# Patient Record
Sex: Female | Born: 1985 | Race: Black or African American | Hispanic: No | Marital: Single | Smoking: Current every day smoker
Health system: Southern US, Community
[De-identification: ages and names within clinical notes are randomized; demographics above are authoritative.]

## PROBLEM LIST (undated history)

## (undated) DIAGNOSIS — K219 Gastro-esophageal reflux disease without esophagitis: Secondary | ICD-10-CM

## (undated) DIAGNOSIS — Z8742 Personal history of other diseases of the female genital tract: Secondary | ICD-10-CM

## (undated) DIAGNOSIS — D649 Anemia, unspecified: Secondary | ICD-10-CM

## (undated) DIAGNOSIS — E669 Obesity, unspecified: Secondary | ICD-10-CM

## (undated) DIAGNOSIS — F329 Major depressive disorder, single episode, unspecified: Secondary | ICD-10-CM

## (undated) DIAGNOSIS — F419 Anxiety disorder, unspecified: Secondary | ICD-10-CM

## (undated) DIAGNOSIS — A749 Chlamydial infection, unspecified: Secondary | ICD-10-CM

## (undated) DIAGNOSIS — F32A Depression, unspecified: Secondary | ICD-10-CM

## (undated) DIAGNOSIS — O139 Gestational [pregnancy-induced] hypertension without significant proteinuria, unspecified trimester: Secondary | ICD-10-CM

## (undated) DIAGNOSIS — Z22322 Carrier or suspected carrier of Methicillin resistant Staphylococcus aureus: Secondary | ICD-10-CM

## (undated) DIAGNOSIS — A599 Trichomoniasis, unspecified: Secondary | ICD-10-CM

## (undated) DIAGNOSIS — T7840XA Allergy, unspecified, initial encounter: Secondary | ICD-10-CM

## (undated) DIAGNOSIS — N390 Urinary tract infection, site not specified: Secondary | ICD-10-CM

## (undated) DIAGNOSIS — E119 Type 2 diabetes mellitus without complications: Secondary | ICD-10-CM

## (undated) HISTORY — DX: Type 2 diabetes mellitus without complications: E11.9

## (undated) HISTORY — PX: WISDOM TOOTH EXTRACTION: SHX21

## (undated) HISTORY — PX: CHOLECYSTECTOMY: SHX55

## (undated) HISTORY — PX: ECTOPIC PREGNANCY SURGERY: SHX613

## (undated) HISTORY — DX: Gastro-esophageal reflux disease without esophagitis: K21.9

## (undated) HISTORY — DX: Anxiety disorder, unspecified: F41.9

## (undated) HISTORY — DX: Allergy, unspecified, initial encounter: T78.40XA

## (undated) HISTORY — DX: Anemia, unspecified: D64.9

---
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
## 2005-02-20 ENCOUNTER — Emergency Department (HOSPITAL_COMMUNITY): Admission: EM | Admit: 2005-02-20 | Discharge: 2005-02-20 | Payer: Self-pay | Admitting: Emergency Medicine

## 2005-03-07 ENCOUNTER — Emergency Department (HOSPITAL_COMMUNITY): Admission: EM | Admit: 2005-03-07 | Discharge: 2005-03-07 | Payer: Self-pay | Admitting: Emergency Medicine

## 2005-03-19 ENCOUNTER — Emergency Department (HOSPITAL_COMMUNITY): Admission: EM | Admit: 2005-03-19 | Discharge: 2005-03-19 | Payer: Self-pay | Admitting: Emergency Medicine

## 2005-04-02 ENCOUNTER — Emergency Department (HOSPITAL_COMMUNITY): Admission: EM | Admit: 2005-04-02 | Discharge: 2005-04-02 | Payer: Self-pay | Admitting: Emergency Medicine

## 2005-05-06 ENCOUNTER — Emergency Department (HOSPITAL_COMMUNITY): Admission: EM | Admit: 2005-05-06 | Discharge: 2005-05-06 | Payer: Self-pay | Admitting: Family Medicine

## 2005-07-02 ENCOUNTER — Emergency Department (HOSPITAL_COMMUNITY): Admission: EM | Admit: 2005-07-02 | Discharge: 2005-07-02 | Payer: Self-pay | Admitting: *Deleted

## 2005-07-04 ENCOUNTER — Emergency Department (HOSPITAL_COMMUNITY): Admission: EM | Admit: 2005-07-04 | Discharge: 2005-07-04 | Payer: Self-pay | Admitting: Family Medicine

## 2005-07-04 ENCOUNTER — Emergency Department (HOSPITAL_COMMUNITY): Admission: EM | Admit: 2005-07-04 | Discharge: 2005-07-04 | Payer: Self-pay | Admitting: Emergency Medicine

## 2005-09-23 ENCOUNTER — Emergency Department (HOSPITAL_COMMUNITY): Admission: EM | Admit: 2005-09-23 | Discharge: 2005-09-24 | Payer: Self-pay | Admitting: Emergency Medicine

## 2005-09-25 ENCOUNTER — Ambulatory Visit (HOSPITAL_COMMUNITY): Admission: AD | Admit: 2005-09-25 | Discharge: 2005-09-26 | Payer: Self-pay | Admitting: Obstetrics and Gynecology

## 2005-09-30 ENCOUNTER — Inpatient Hospital Stay (HOSPITAL_COMMUNITY): Admission: AD | Admit: 2005-09-30 | Discharge: 2005-09-30 | Payer: Self-pay | Admitting: Obstetrics & Gynecology

## 2005-10-08 ENCOUNTER — Other Ambulatory Visit: Admission: RE | Admit: 2005-10-08 | Discharge: 2005-10-08 | Payer: Self-pay | Admitting: Gynecology

## 2006-05-12 ENCOUNTER — Emergency Department (HOSPITAL_COMMUNITY): Admission: EM | Admit: 2006-05-12 | Discharge: 2006-05-12 | Payer: Self-pay | Admitting: Family Medicine

## 2006-05-27 ENCOUNTER — Emergency Department (HOSPITAL_COMMUNITY): Admission: EM | Admit: 2006-05-27 | Discharge: 2006-05-27 | Payer: Self-pay | Admitting: Emergency Medicine

## 2006-06-07 ENCOUNTER — Emergency Department (HOSPITAL_COMMUNITY): Admission: EM | Admit: 2006-06-07 | Discharge: 2006-06-07 | Payer: Self-pay | Admitting: Emergency Medicine

## 2006-07-01 ENCOUNTER — Emergency Department (HOSPITAL_COMMUNITY): Admission: EM | Admit: 2006-07-01 | Discharge: 2006-07-01 | Payer: Self-pay | Admitting: Emergency Medicine

## 2006-07-19 ENCOUNTER — Emergency Department (HOSPITAL_COMMUNITY): Admission: EM | Admit: 2006-07-19 | Discharge: 2006-07-19 | Payer: Self-pay | Admitting: Family Medicine

## 2006-08-28 ENCOUNTER — Emergency Department (HOSPITAL_COMMUNITY): Admission: EM | Admit: 2006-08-28 | Discharge: 2006-08-29 | Payer: Self-pay | Admitting: Emergency Medicine

## 2006-09-27 ENCOUNTER — Emergency Department (HOSPITAL_COMMUNITY): Admission: EM | Admit: 2006-09-27 | Discharge: 2006-09-27 | Payer: Self-pay | Admitting: Emergency Medicine

## 2006-10-25 ENCOUNTER — Emergency Department (HOSPITAL_COMMUNITY): Admission: EM | Admit: 2006-10-25 | Discharge: 2006-10-25 | Payer: Self-pay | Admitting: Emergency Medicine

## 2007-02-17 ENCOUNTER — Emergency Department (HOSPITAL_COMMUNITY): Admission: EM | Admit: 2007-02-17 | Discharge: 2007-02-17 | Payer: Self-pay | Admitting: Emergency Medicine

## 2007-05-23 ENCOUNTER — Ambulatory Visit (HOSPITAL_COMMUNITY): Admission: RE | Admit: 2007-05-23 | Discharge: 2007-05-23 | Payer: Self-pay | Admitting: Obstetrics & Gynecology

## 2007-07-17 ENCOUNTER — Ambulatory Visit: Payer: Self-pay | Admitting: Advanced Practice Midwife

## 2007-07-17 ENCOUNTER — Inpatient Hospital Stay (HOSPITAL_COMMUNITY): Admission: AD | Admit: 2007-07-17 | Discharge: 2007-07-17 | Payer: Self-pay | Admitting: Obstetrics & Gynecology

## 2007-07-31 ENCOUNTER — Inpatient Hospital Stay (HOSPITAL_COMMUNITY): Admission: AD | Admit: 2007-07-31 | Discharge: 2007-07-31 | Payer: Self-pay | Admitting: Gynecology

## 2007-08-22 ENCOUNTER — Ambulatory Visit: Payer: Self-pay | Admitting: Obstetrics & Gynecology

## 2007-08-22 ENCOUNTER — Ambulatory Visit (HOSPITAL_COMMUNITY): Admission: RE | Admit: 2007-08-22 | Discharge: 2007-08-22 | Payer: Self-pay | Admitting: Gynecology

## 2007-08-29 ENCOUNTER — Ambulatory Visit: Payer: Self-pay | Admitting: Obstetrics and Gynecology

## 2007-08-31 ENCOUNTER — Ambulatory Visit: Payer: Self-pay | Admitting: Obstetrics and Gynecology

## 2007-08-31 ENCOUNTER — Inpatient Hospital Stay (HOSPITAL_COMMUNITY): Admission: AD | Admit: 2007-08-31 | Discharge: 2007-08-31 | Payer: Self-pay | Admitting: Obstetrics and Gynecology

## 2007-09-02 ENCOUNTER — Ambulatory Visit: Payer: Self-pay | Admitting: *Deleted

## 2007-09-02 ENCOUNTER — Inpatient Hospital Stay (HOSPITAL_COMMUNITY): Admission: AD | Admit: 2007-09-02 | Discharge: 2007-09-06 | Payer: Self-pay | Admitting: Obstetrics & Gynecology

## 2007-09-30 ENCOUNTER — Emergency Department (HOSPITAL_COMMUNITY): Admission: EM | Admit: 2007-09-30 | Discharge: 2007-09-30 | Payer: Self-pay | Admitting: Emergency Medicine

## 2007-10-14 ENCOUNTER — Inpatient Hospital Stay (HOSPITAL_COMMUNITY): Admission: AD | Admit: 2007-10-14 | Discharge: 2007-10-14 | Payer: Self-pay | Admitting: Obstetrics & Gynecology

## 2007-10-14 ENCOUNTER — Ambulatory Visit: Payer: Self-pay | Admitting: Obstetrics & Gynecology

## 2007-11-16 ENCOUNTER — Emergency Department (HOSPITAL_COMMUNITY): Admission: EM | Admit: 2007-11-16 | Discharge: 2007-11-16 | Payer: Self-pay | Admitting: Emergency Medicine

## 2008-01-01 ENCOUNTER — Emergency Department (HOSPITAL_COMMUNITY): Admission: EM | Admit: 2008-01-01 | Discharge: 2008-01-01 | Payer: Self-pay | Admitting: Emergency Medicine

## 2008-01-16 ENCOUNTER — Emergency Department (HOSPITAL_COMMUNITY): Admission: EM | Admit: 2008-01-16 | Discharge: 2008-01-16 | Payer: Self-pay | Admitting: Emergency Medicine

## 2008-01-20 ENCOUNTER — Inpatient Hospital Stay (HOSPITAL_COMMUNITY): Admission: AD | Admit: 2008-01-20 | Discharge: 2008-01-20 | Payer: Self-pay | Admitting: Gynecology

## 2008-03-20 ENCOUNTER — Emergency Department (HOSPITAL_COMMUNITY): Admission: EM | Admit: 2008-03-20 | Discharge: 2008-03-20 | Payer: Self-pay | Admitting: Emergency Medicine

## 2008-03-29 ENCOUNTER — Emergency Department (HOSPITAL_COMMUNITY): Admission: EM | Admit: 2008-03-29 | Discharge: 2008-03-29 | Payer: Self-pay | Admitting: Emergency Medicine

## 2008-04-10 ENCOUNTER — Emergency Department (HOSPITAL_COMMUNITY): Admission: EM | Admit: 2008-04-10 | Discharge: 2008-04-10 | Payer: Self-pay | Admitting: Emergency Medicine

## 2008-04-16 ENCOUNTER — Inpatient Hospital Stay (HOSPITAL_COMMUNITY): Admission: AD | Admit: 2008-04-16 | Discharge: 2008-04-16 | Payer: Self-pay | Admitting: Obstetrics & Gynecology

## 2008-07-08 ENCOUNTER — Emergency Department (HOSPITAL_COMMUNITY): Admission: EM | Admit: 2008-07-08 | Discharge: 2008-07-08 | Payer: Self-pay | Admitting: Emergency Medicine

## 2009-04-19 ENCOUNTER — Emergency Department (HOSPITAL_COMMUNITY): Admission: EM | Admit: 2009-04-19 | Discharge: 2009-04-19 | Payer: Self-pay | Admitting: Emergency Medicine

## 2009-04-21 ENCOUNTER — Emergency Department (HOSPITAL_COMMUNITY): Admission: EM | Admit: 2009-04-21 | Discharge: 2009-04-21 | Payer: Self-pay | Admitting: Emergency Medicine

## 2009-04-21 ENCOUNTER — Emergency Department (HOSPITAL_COMMUNITY): Admission: EM | Admit: 2009-04-21 | Discharge: 2009-04-21 | Payer: Self-pay | Admitting: Family Medicine

## 2009-05-02 ENCOUNTER — Emergency Department (HOSPITAL_COMMUNITY): Admission: EM | Admit: 2009-05-02 | Discharge: 2009-05-02 | Payer: Self-pay | Admitting: Emergency Medicine

## 2009-11-03 ENCOUNTER — Emergency Department (HOSPITAL_COMMUNITY): Admission: EM | Admit: 2009-11-03 | Discharge: 2009-11-03 | Payer: Self-pay | Admitting: Family Medicine

## 2010-07-09 LAB — POCT URINALYSIS DIP (DEVICE)
Glucose, UA: NEGATIVE mg/dL
Glucose, UA: NEGATIVE mg/dL
Ketones, ur: 15 mg/dL — AB
Nitrite: NEGATIVE
Nitrite: NEGATIVE
Protein, ur: NEGATIVE mg/dL
Protein, ur: 30 mg/dL — AB
Specific Gravity, Urine: 1.025 (ref 1.005–1.030)
Specific Gravity, Urine: 1.015 (ref 1.005–1.030)
Urobilinogen, UA: 1 mg/dL (ref 0.0–1.0)
Urobilinogen, UA: 1 mg/dL (ref 0.0–1.0)
pH: 6.5 (ref 5.0–8.0)
pH: 6.5 (ref 5.0–8.0)

## 2010-07-09 LAB — POCT PREGNANCY, URINE
Preg Test, Ur: NEGATIVE
Preg Test, Ur: NEGATIVE

## 2010-07-24 LAB — HEPATITIS B SURFACE ANTIGEN: Hepatitis B Surface Ag: NEGATIVE

## 2010-07-24 LAB — WOUND CULTURE

## 2010-07-24 LAB — HEPATITIS B SURFACE ANTIBODY,QUALITATIVE: Hep B S Ab: NEGATIVE

## 2010-07-24 LAB — HIV ANTIBODY (ROUTINE TESTING W REFLEX): HIV: NONREACTIVE

## 2010-07-24 LAB — HEPATITIS B CORE ANTIBODY, TOTAL: Hep B Core Total Ab: NEGATIVE

## 2010-08-03 LAB — URINE MICROSCOPIC-ADD ON

## 2010-08-03 LAB — URINALYSIS, ROUTINE W REFLEX MICROSCOPIC
Bilirubin Urine: NEGATIVE
Glucose, UA: NEGATIVE mg/dL
Specific Gravity, Urine: 1.028 (ref 1.005–1.030)

## 2010-08-24 ENCOUNTER — Emergency Department (HOSPITAL_COMMUNITY): Payer: Self-pay

## 2010-08-24 ENCOUNTER — Emergency Department (HOSPITAL_COMMUNITY)
Admission: EM | Admit: 2010-08-24 | Discharge: 2010-08-24 | Disposition: A | Discharge status: Home / Self Care | Payer: Self-pay | Attending: Emergency Medicine | Admitting: Emergency Medicine

## 2010-08-24 DIAGNOSIS — S8990XA Unspecified injury of unspecified lower leg, initial encounter: Secondary | ICD-10-CM | POA: Insufficient documentation

## 2010-08-24 DIAGNOSIS — F411 Generalized anxiety disorder: Secondary | ICD-10-CM | POA: Insufficient documentation

## 2010-08-24 DIAGNOSIS — M25579 Pain in unspecified ankle and joints of unspecified foot: Secondary | ICD-10-CM | POA: Insufficient documentation

## 2010-08-24 DIAGNOSIS — S99919A Unspecified injury of unspecified ankle, initial encounter: Secondary | ICD-10-CM | POA: Insufficient documentation

## 2010-08-24 DIAGNOSIS — Y92009 Unspecified place in unspecified non-institutional (private) residence as the place of occurrence of the external cause: Secondary | ICD-10-CM | POA: Insufficient documentation

## 2010-08-24 DIAGNOSIS — S93409A Sprain of unspecified ligament of unspecified ankle, initial encounter: Secondary | ICD-10-CM | POA: Insufficient documentation

## 2010-08-24 DIAGNOSIS — W010XXA Fall on same level from slipping, tripping and stumbling without subsequent striking against object, initial encounter: Secondary | ICD-10-CM | POA: Insufficient documentation

## 2010-09-06 ENCOUNTER — Inpatient Hospital Stay (INDEPENDENT_AMBULATORY_CARE_PROVIDER_SITE_OTHER)
Admission: RE | Admit: 2010-09-06 | Discharge: 2010-09-06 | Disposition: A | Discharge status: Home / Self Care | Payer: Self-pay | Source: Ambulatory Visit

## 2010-09-06 ENCOUNTER — Ambulatory Visit (INDEPENDENT_AMBULATORY_CARE_PROVIDER_SITE_OTHER): Payer: Self-pay

## 2010-09-06 DIAGNOSIS — R10819 Abdominal tenderness, unspecified site: Secondary | ICD-10-CM

## 2010-09-07 LAB — POCT URINALYSIS DIP (DEVICE)
Bilirubin Urine: NEGATIVE
Glucose, UA: NEGATIVE mg/dL
Ketones, ur: NEGATIVE mg/dL
Nitrite: NEGATIVE
Specific Gravity, Urine: 1.02 (ref 1.005–1.030)

## 2010-09-07 LAB — URINE CULTURE
Colony Count: 50000
Culture  Setup Time: 201205162209

## 2010-09-07 LAB — OCCULT BLOOD, POC DEVICE: Fecal Occult Bld: NEGATIVE

## 2010-09-08 NOTE — Op Note (Signed)
Ann Moran, Ann Moran               ACCOUNT NO.:  [id number]   MEDICAL RECORD NO.:  [id number]          PATIENT TYPE:  AMB   LOCATION:  SDC                           FACILITY:  [facility]   PHYSICIAN:  Ivor Costa. Farrel Gobble, M.D. DATE OF BIRTH:  03-26-1986   DATE OF PROCEDURE:  09/25/2005  DATE OF DISCHARGE:  09/26/2005                                 OPERATIVE REPORT   PREOPERATIVE DIAGNOSIS:  Ruptured ectopic pregnancy.   POSTOPERATIVE DIAGNOSIS:  Hemorrhagic left ovarian cyst, extensive bowel  adhesions, minimal hemoperitoneum.   PROCEDURE:  1.  Suction D&C.  2.  Diagnostic laparoscopy.  3.  Evacuation of hemoperitoneum.  4.  Coagulation of hemorrhagic ovarian cyst wall.   SURGEON:  Ivor Costa. Farrel Gobble, M.D.   ASSISTANTJeremy Johann.   ANESTHESIA:  General.   ESTIMATED BLOOD LOSS:  Operatively was minimal, approximately 100 mL of  mostly clotted blood in pelvis.   INDICATIONS:  Undesired prenancy with pelvic pain, hemoperitonuem noted on  ultrasound.  Location of prenancy not prevously confirmed.   FINDINGS:  Mostly clotted blood in the pelvis.  Both tubes were normal  without any evidence of tubal disease or ectopic.  There was a bleeding left  ovarian cyst with a clot, extensive bowel adhesions to the side walls  bilaterally, normal-appearing uterus.   COMPLICATIONS:  None.   PATHOLOGY:  uterine contents.   PROCEDURE:  The patient was taken to the operating room.  General anesthesia  was induced, placed in dorsal lithotomy position, prepped and draped in  usual sterile fashion.  A bivalve was placed in vagina.  Cervix was  visualized, stabilized with the uterine manipulator.  Attention was then  turned to the abdomen where an infraumbilical incision was made with scalpel  through which a Veress needle was inserted.  Opening pressure was 4.  A  pneumoperitoneum was then created until tympani was appreciated above the  liver, after which 10/11 disposable trocar was inserted through  the  infraumbilical port.  There was a small amount of blood in the pelvis, most  of which was clotted.  Under direct visualization, two 5-mm ports were then  made with the suction irrigator being placed through one of the lower ports.  Using the lower ports, the pelvis was inspected.  Tubes and ovaries were  remarkable only for what appeared to be a left side hemorrhagic ovarian cyst  but had a clot on it.  Unfortunately, during the irrigation, the clot was  disrupted, and the cyst wall began to bleed.  This was then treated with  cautery, reirrigated.  The nonclotted blood was removed from the pelvis.  The clotted blood was left in place.  Attention was then turned to the  pelvis.  Gloves were changed.  The sterile weighted speculum was placed in  the posterior cul-de-sac.  The uterus and cervix were stabilized with a  single-tooth tenaculum.  Cervix was then gently dilated up to #23-French  after which a flexible 6 suction curette was advanced through the cervix.  Uterine contents were cleared on two passes and sent for permanent specimen.  Gloves were then changed again.  The ovary was inspected and felt to be  hemostatic.  There was no additional blood in the pelvis.  The instruments  were then removed under direct visualization as were the ports.  The  infraumbilical fascia was closed with figure-of-eight of 0 Vicryl.  Skin was  closed with 3-0 plain.  The lower ports were closed with tincture of Benzoin  and Steri-Strips.  All three ports were injected with a total of 10 mL of  0.25% Marcaine.  The patient tolerated the procedure well.  Sponge, lap and  needle counts were correct x2.  She was transferred to the PACU in stable  condition.      Ivor Costa. Farrel Gobble, M.D.  Electronically Signed     THL/MEDQ  D:  09/25/2005  T:  09/26/2005  Job:  [id number]

## 2011-01-15 LAB — URINALYSIS, ROUTINE W REFLEX MICROSCOPIC
Glucose, UA: NEGATIVE
Hgb urine dipstick: NEGATIVE
Protein, ur: NEGATIVE

## 2011-01-16 LAB — URINALYSIS, ROUTINE W REFLEX MICROSCOPIC
Glucose, UA: NEGATIVE
Ketones, ur: NEGATIVE
pH: 6.5

## 2011-01-17 LAB — BASIC METABOLIC PANEL
BUN: <1 — ABNORMAL LOW
Chloride: 106
GFR calc Af Amer: >60
Potassium: 3.9

## 2011-01-17 LAB — CBC
HCT: 29 — ABNORMAL LOW
Hemoglobin: 10 — ABNORMAL LOW
MCV: 95.6
RBC: 3.03 — ABNORMAL LOW
WBC: 9.2

## 2011-01-22 LAB — CBC
HCT: 38
Hemoglobin: 12.7
MCHC: 33.4
MCV: 93.9
RBC: 4.05

## 2011-01-22 LAB — WET PREP, GENITAL

## 2011-01-24 LAB — POCT PREGNANCY, URINE: Preg Test, Ur: NEGATIVE

## 2011-01-26 LAB — WET PREP, GENITAL: Yeast Wet Prep HPF POC: NONE SEEN

## 2011-01-26 LAB — POCT PREGNANCY, URINE: Preg Test, Ur: NEGATIVE

## 2011-01-31 LAB — POCT PREGNANCY, URINE: Preg Test, Ur: POSITIVE

## 2011-03-26 ENCOUNTER — Encounter: Payer: Self-pay | Admitting: Emergency Medicine

## 2011-03-26 ENCOUNTER — Emergency Department (INDEPENDENT_AMBULATORY_CARE_PROVIDER_SITE_OTHER)
Admission: EM | Admit: 2011-03-26 | Discharge: 2011-03-26 | Disposition: A | Discharge status: Home / Self Care | Payer: Self-pay | Source: Home / Self Care | Attending: Emergency Medicine | Admitting: Emergency Medicine

## 2011-03-26 DIAGNOSIS — J069 Acute upper respiratory infection, unspecified: Secondary | ICD-10-CM

## 2011-03-26 MED ORDER — GUAIFENESIN-CODEINE 100-10 MG/5ML PO SYRP: 5.0000 mL | ORAL_SOLUTION | Freq: Three times a day (TID) | ORAL | Status: AC | PRN

## 2011-03-26 NOTE — ED Notes (Signed)
Pt here with cough and sob on deep breathing and chest tightness and burning that started 03/23/11.sneezing and clear phlegm reported.pt tried nyquil and alka seltzer but no relief.afebrile

## 2011-03-26 NOTE — ED Provider Notes (Addendum)
History     CSN: [id number] Arrival date & time: 03/26/2011 11:28 AM   First MD Initiated Contact with Patient 03/26/11 1139      Chief Complaint  Patient presents with  . Cough  . URI  . Shortness of Breath    (Consider location/radiation/quality/duration/timing/severity/associated sxs/prior treatment) HPI Comments: cougihng and chest hurting when i cough hard, it feels like a burrning type congestion in my chest,   Patient is a 25 y.o. female presenting with cough, URI, and shortness of breath. The history is provided by the patient.  Cough This is a new problem. The problem occurs every few minutes. The cough is productive of purulent sputum. There has been no fever. Associated symptoms include shortness of breath. Pertinent negatives include no ear pain.  URI The primary symptoms include cough. Primary symptoms do not include ear pain.  Shortness of Breath  Associated symptoms include cough and shortness of breath.    History reviewed. No pertinent past medical history.  History reviewed. No pertinent past surgical history.  History reviewed. No pertinent family history.  History  Substance Use Topics  . Smoking status: Current Everyday Smoker  . Smokeless tobacco: Not on file  . Alcohol Use: Yes    OB History    Grav Para Term Preterm Abortions TAB SAB Ect Mult Living                  Review of Systems  HENT: Negative for ear pain.   Respiratory: Positive for cough and shortness of breath.     Allergies  Review of patient's allergies indicates no known allergies.  Home Medications   Current Outpatient Rx  Name Route Sig Dispense Refill  . GUAIFENESIN-CODEINE 100-10 MG/5ML PO SYRP Oral Take 5 mLs by mouth 3 (three) times daily as needed for cough. 120 mL 0    BP 127/83  Pulse 86  Temp(Src) 99.4 F (37.4 C) (Oral)  Resp 24  SpO2 99%  Physical Exam  Nursing note and vitals reviewed. Constitutional: She appears well-developed and well-nourished.  No distress.  HENT:  Head: Normocephalic.  Mouth/Throat: Uvula is midline, oropharynx is clear and moist and mucous membranes are normal.  Eyes: Pupils are equal, round, and reactive to light.  Neck: Normal range of motion.  Pulmonary/Chest: Breath sounds normal. No respiratory distress. She has no decreased breath sounds. She has no wheezes. She has no rales. She exhibits no tenderness.    ED Course  Procedures (including critical care time)  Labs Reviewed - No data to display No results found.   1. URI, acute       MDM  Cough and mild RAD        Jimmie Molly, MD 03/26/11 1837  Jimmie Molly, MD 03/26/11 [phone]

## 2011-07-16 IMAGING — CR DG ANKLE COMPLETE 3+V*L*
3 series · 3 of 3 positions shown · non-contrast
Comparison: None

CLINICAL DATA: Left ankle pain, fell down stairs

LEFT ANKLE COMPLETE - 3+ VIEW

[t ankle joint ap left]
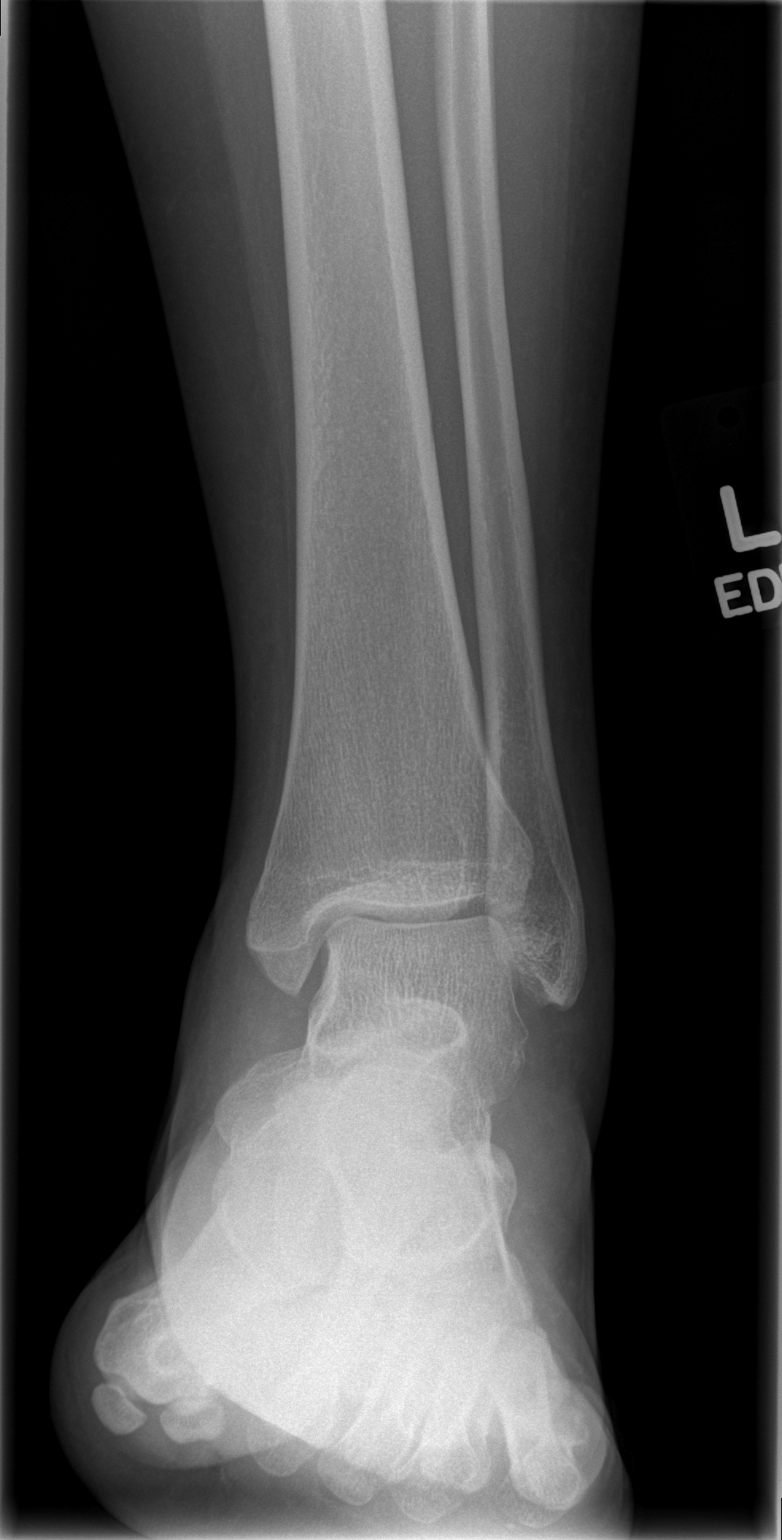

[t ankle joint oblique left]
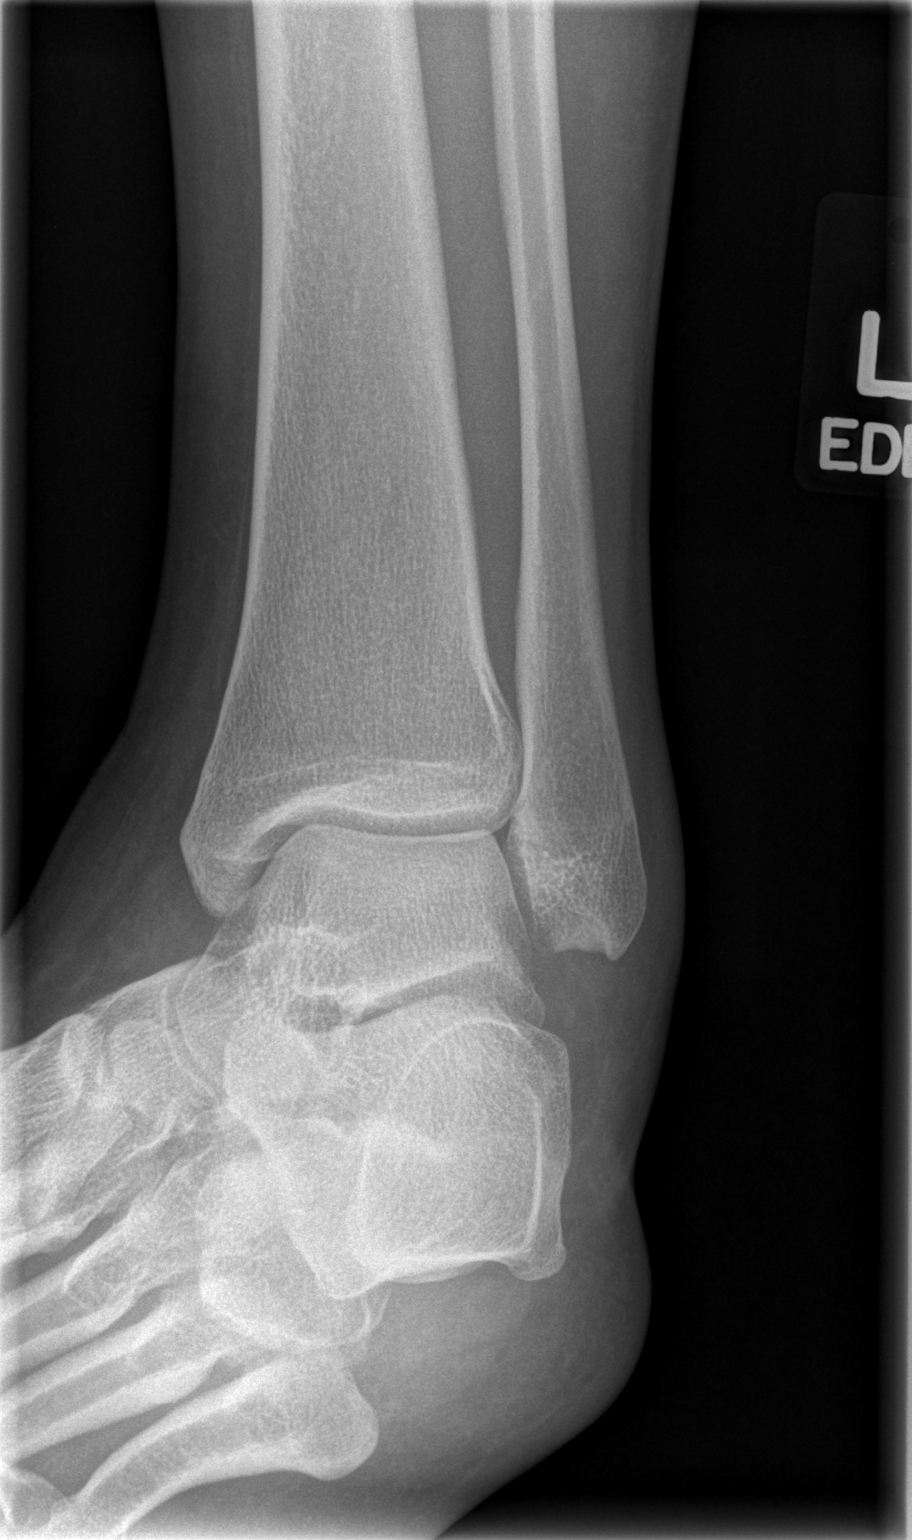

[t ankle joint lat left]
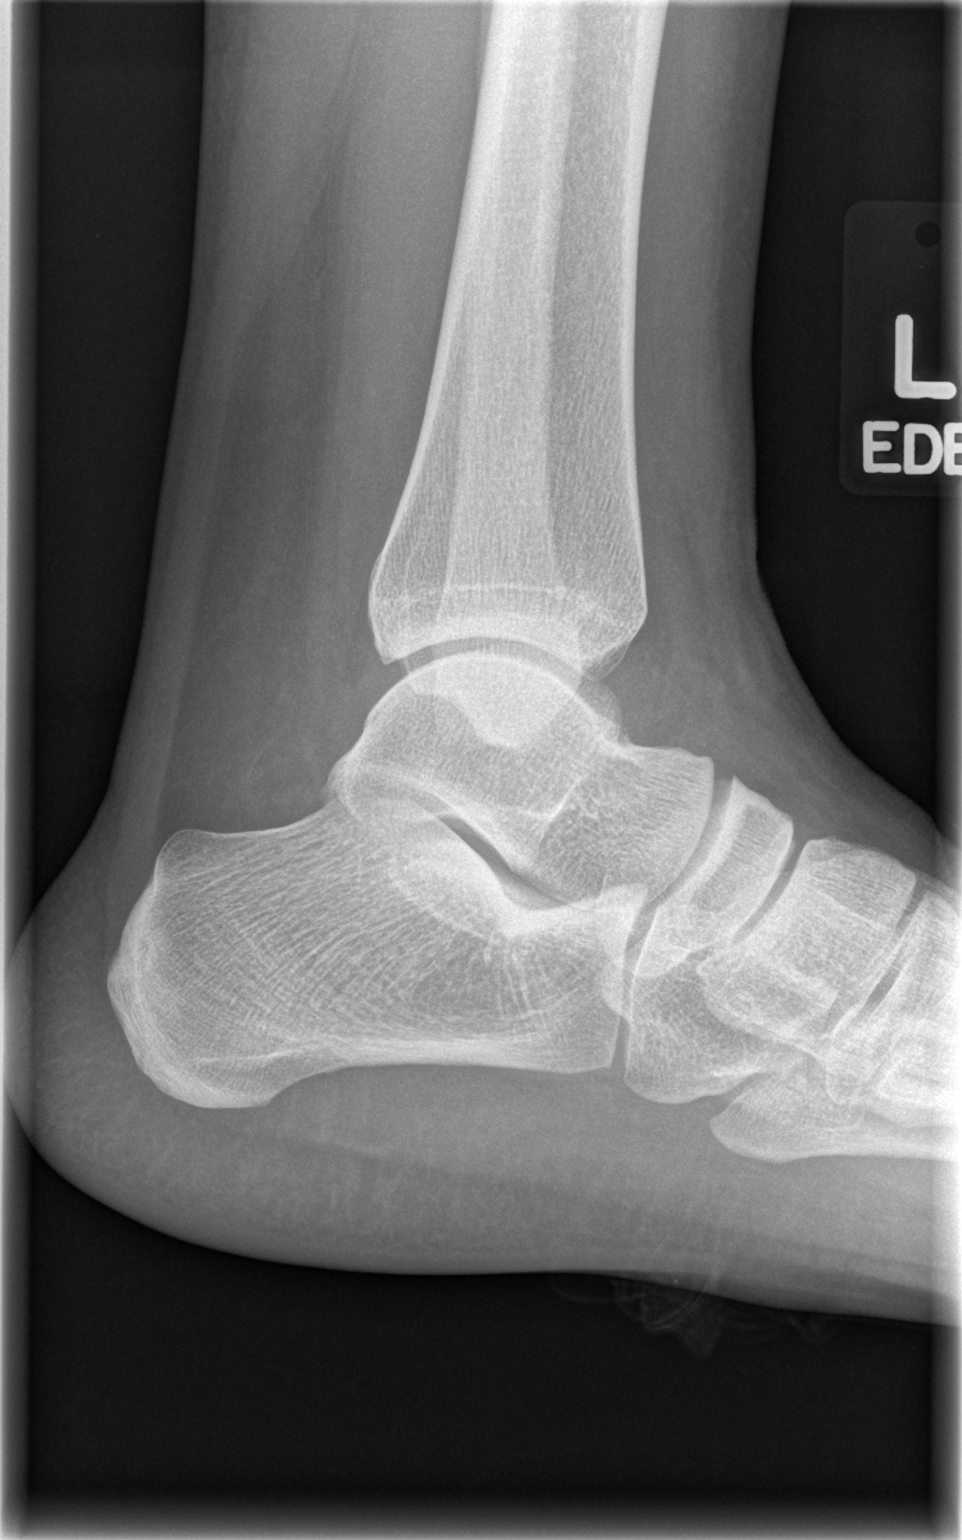

[3 of 3 positions shown; findings below may reference images not displayed]

FINDINGS: Mild regional soft tissue swelling.
Ankle mortise intact.
Osseous mineralization normal.
No acute fracture, dislocation or bone destruction.
IMPRESSION: No acute osseous abnormalities.

## 2011-11-14 ENCOUNTER — Inpatient Hospital Stay (HOSPITAL_COMMUNITY)
Admission: AD | Admit: 2011-11-14 | Discharge: 2011-11-14 | Disposition: A | Discharge status: Home / Self Care | Payer: Self-pay | Source: Ambulatory Visit | Attending: Obstetrics & Gynecology | Admitting: Obstetrics & Gynecology

## 2011-11-14 ENCOUNTER — Encounter (HOSPITAL_COMMUNITY): Payer: Self-pay

## 2011-11-14 DIAGNOSIS — N949 Unspecified condition associated with female genital organs and menstrual cycle: Secondary | ICD-10-CM | POA: Insufficient documentation

## 2011-11-14 DIAGNOSIS — N760 Acute vaginitis: Secondary | ICD-10-CM | POA: Insufficient documentation

## 2011-11-14 DIAGNOSIS — B9689 Other specified bacterial agents as the cause of diseases classified elsewhere: Secondary | ICD-10-CM | POA: Insufficient documentation

## 2011-11-14 DIAGNOSIS — A499 Bacterial infection, unspecified: Secondary | ICD-10-CM

## 2011-11-14 LAB — WET PREP, GENITAL: Yeast Wet Prep HPF POC: NONE SEEN

## 2011-11-14 MED ORDER — METRONIDAZOLE 500 MG PO TABS: 500.0000 mg | ORAL_TABLET | Freq: Two times a day (BID) | ORAL | Status: AC

## 2011-11-14 NOTE — MAU Provider Note (Signed)
History     CSN: [id number]  Arrival date and time: 11/14/11 1256   First Provider Initiated Contact with Patient 11/14/11 1428      Chief Complaint  Patient presents with  . Possible Pregnancy   HPIMelanie E Moran is 26 y.o. G2P0011 presents for STD screening.  States her boyfriend is having burning with urination and thinks he may have an infection.   States she has vaginal discomfort and ? Hemorrhoids 2 days ago.  Denies fever or chills.  Denies vaginal discharge or abnormal bleeding.  Not using contraception doesn't wish to be pregnancy.  Had + UPT at home last night, negative here.     No past medical history on file.  Past Surgical History  Procedure Date  . Ectopic pregnancy surgery   . No past surgeries     No family history on file.  History  Substance Use Topics  . Smoking status: Current Everyday Smoker  . Smokeless tobacco: Not on file  . Alcohol Use: Yes    Allergies:  Allergies  Allergen Reactions  . Aspirin Anaphylaxis    Prescriptions prior to admission  Medication Sig Dispense Refill  . diphenhydramine-acetaminophen (TYLENOL PM) 25-500 MG TABS Take 1 tablet by mouth at bedtime as needed. sleep        Review of Systems  Constitutional: Negative.   Respiratory: Negative.   Cardiovascular: Negative.   Gastrointestinal: Negative for nausea, vomiting and abdominal pain.  Genitourinary:       + vaginal discomfort   Physical Exam   Blood pressure 132/82, pulse 65, temperature 98.2 F (36.8 C), temperature source Oral, resp. rate 16, height 5\' 8"  (1.727 m), weight 281 lb 12.8 oz (127.824 kg), last menstrual period 08/27/2011, SpO2 100.00%.  Physical Exam  Constitutional: She is oriented to person, place, and time. She appears well-developed and well-nourished. No distress.  HENT:  Head: Normocephalic.  Neck: Normal range of motion.  Cardiovascular: Normal rate.   Respiratory: Effort normal.  GI: Soft. She exhibits no distension and no mass.  There is no tenderness. There is no rebound and no guarding.  Genitourinary: Uterus is not enlarged and not tender. Cervix exhibits no discharge. Right adnexum displays no mass, no tenderness and no fullness. Left adnexum displays no mass, no tenderness and no fullness. No bleeding around the vagina. Vaginal discharge (Small amount of thin discharge with odor.) found.  Neurological: She is alert and oriented to person, place, and time.  Skin: Skin is warm and dry.   Results for orders placed during the hospital encounter of 11/14/11 (from the past 24 hour(s))  POCT PREGNANCY, URINE     Status: Normal   Collection Time   11/14/11  1:38 PM      Component Value Range   Preg Test, Ur NEGATIVE  NEGATIVE  WET PREP, GENITAL     Status: Abnormal   Collection Time   11/14/11  2:30 PM      Component Value Range   Yeast Wet Prep HPF POC NONE SEEN  NONE SEEN   Trich, Wet Prep NONE SEEN  NONE SEEN   Clue Cells Wet Prep HPF POC FEW (*) NONE SEEN   WBC, Wet Prep HPF POC FEW (*) NONE SEEN    MAU Course  Procedures  GC/CHL culture to lab  MDM   Assessment and Plan  A:  Bacterial vaginosis      Screen for GC/CHL     Possible STI exposure  P:  Rx for Flagyl  Instructed to avoid intercourse until STD cultures are back and if positive for she and her boyfriend to go to [facility] for treatment      Encouraged her to seek care for birth control.  Ann Moran,EVE M 11/14/2011, 3:54 PM

## 2011-11-14 NOTE — MAU Note (Signed)
Patient states she had a positive home pregnancy test yesterday. States her boyfriend told her he may have an STD and she needs to be tested. Patient states at times she has a soreness in the vagina, no discharge or bleeding.

## 2011-11-15 NOTE — MAU Provider Note (Signed)
Attestation of Attending Supervision of Advanced Practitioner (CNM/NP): Evaluation and management procedures were performed by the Advanced Practitioner under my supervision and collaboration.  I have reviewed the Advanced Practitioner's note and chart, and I agree with the management and plan.  Jaynie Collins, M.D. 11/15/2011 9:17 AM

## 2011-11-16 LAB — GC/CHLAMYDIA PROBE AMP, GENITAL
Chlamydia, DNA Probe: NEGATIVE
GC Probe Amp, Genital: POSITIVE — AB

## 2011-11-26 ENCOUNTER — Emergency Department (HOSPITAL_COMMUNITY)
Admission: EM | Admit: 2011-11-26 | Discharge: 2011-11-26 | Disposition: A | Discharge status: Home / Self Care | Payer: Self-pay | Attending: Emergency Medicine | Admitting: Emergency Medicine

## 2011-11-26 ENCOUNTER — Encounter (HOSPITAL_COMMUNITY): Payer: Self-pay | Admitting: Emergency Medicine

## 2011-11-26 DIAGNOSIS — N898 Other specified noninflammatory disorders of vagina: Secondary | ICD-10-CM | POA: Insufficient documentation

## 2011-11-26 DIAGNOSIS — R300 Dysuria: Secondary | ICD-10-CM | POA: Insufficient documentation

## 2011-11-26 DIAGNOSIS — Z202 Contact with and (suspected) exposure to infections with a predominantly sexual mode of transmission: Secondary | ICD-10-CM | POA: Insufficient documentation

## 2011-11-26 DIAGNOSIS — A549 Gonococcal infection, unspecified: Secondary | ICD-10-CM

## 2011-11-26 MED ORDER — CEFTRIAXONE SODIUM 250 MG IJ SOLR
250.0000 mg | Freq: Once | INTRAMUSCULAR | Status: AC
  Administered 2011-11-26: 250 mg via INTRAMUSCULAR
  Filled 2011-11-26: qty 250

## 2011-11-26 MED ORDER — AZITHROMYCIN 250 MG PO TABS: 2000.0000 mg | ORAL_TABLET | Freq: Every day | ORAL | Status: AC

## 2011-11-26 NOTE — ED Provider Notes (Signed)
History     CSN: [id number]  Arrival date & time 11/26/11  0247   First MD Initiated Contact with Patient 11/26/11 0510      Chief Complaint  Patient presents with  . Vaginal Discharge    (Consider location/radiation/quality/duration/timing/severity/associated sxs/prior treatment) Patient is a 26 y.o. female presenting with vaginal discharge. The history is provided by the patient. No language interpreter was used.  Vaginal Discharge This is a recurrent problem. The current episode started in the past 7 days. The problem occurs daily. The problem has been gradually worsening. Pertinent negatives include no abdominal pain, fever, nausea or vomiting.   26 year old female here today to get treatment for gonorrhea. States that her partner tested positive for gonorrhea and she needs to be treated. Also states that she went to [facility] a week ago and was tested for gonorrhea but never got  the results. States that she has had a discharge for about 5 days that has a foul odor. Denies burning with urination or lower abdominal pain fever, nausea and vomiting.    History reviewed. No pertinent past medical history.  Past Surgical History  Procedure Date  . Ectopic pregnancy surgery   . No past surgeries     No family history on file.  History  Substance Use Topics  . Smoking status: Current Everyday Smoker  . Smokeless tobacco: Not on file  . Alcohol Use: Yes     occasional    OB History    Grav Para Term Preterm Abortions TAB SAB Ect Mult Living   2 1   1   1  1       Review of Systems  Constitutional: Negative.  Negative for fever.  HENT: Negative.   Eyes: Negative.   Respiratory: Negative.   Cardiovascular: Negative.   Gastrointestinal: Negative.  Negative for nausea, vomiting and abdominal pain.  Genitourinary: Positive for dysuria and vaginal discharge. Negative for urgency, hematuria, flank pain and vaginal bleeding.  Musculoskeletal: Negative for back pain.    Neurological: Negative.   Psychiatric/Behavioral: Negative.   All other systems reviewed and are negative.    Allergies  Aspirin  Home Medications  No current outpatient prescriptions on file.  BP 156/78  Pulse 79  Temp 98 F (36.7 C) (Oral)  Resp 18  SpO2 100%  LMP 08/27/2011  Physical Exam  Nursing note and vitals reviewed. Constitutional: She is oriented to person, place, and time. She appears well-developed and well-nourished.  HENT:  Head: Normocephalic and atraumatic.  Eyes: Conjunctivae and EOM are normal. Pupils are equal, round, and reactive to light.  Neck: Normal range of motion. Neck supple.  Cardiovascular: Normal rate, regular rhythm, normal heart sounds and intact distal pulses.  Exam reveals no gallop and no friction rub.   No murmur heard. Pulmonary/Chest: Effort normal and breath sounds normal.  Abdominal: Soft. Bowel sounds are normal.  Musculoskeletal: Normal range of motion. She exhibits no edema and no tenderness.  Neurological: She is alert and oriented to person, place, and time. She has normal reflexes.  Skin: Skin is warm and dry.  Psychiatric: She has a normal mood and affect.    ED Course  Procedures (including critical care time)   Labs Reviewed  GC/CHLAMYDIA PROBE AMP, GENITAL  WET PREP, GENITAL   No results found.   No diagnosis found.    MDM  26 year old female exposed to gonorrhea wanting treatment. Will [facility]up [facility] next week. No intercourse until after followup. Rx for  2000 mg of azithromycin. Tested positive for gonorrhea last week it month that was never treated.        Remi Haggard, NP 11/26/11 [phone]

## 2011-11-26 NOTE — ED Notes (Signed)
Pt c/o  Cream colored vaginal discharge, malodorous.  Pt states partner recently tx for gonorrhea

## 2011-11-27 NOTE — ED Provider Notes (Signed)
Medical screening examination/treatment/procedure(s) were performed by non-physician practitioner and as supervising physician I was immediately available for consultation/collaboration.    Kinslee Dalpe D Sharda Keddy, MD 11/27/11 0048 

## 2012-06-03 ENCOUNTER — Inpatient Hospital Stay (HOSPITAL_COMMUNITY)
Admission: AD | Admit: 2012-06-03 | Discharge: 2012-06-03 | Disposition: A | Discharge status: Home / Self Care | Payer: Self-pay | Source: Ambulatory Visit | Attending: Obstetrics & Gynecology | Admitting: Obstetrics & Gynecology

## 2012-06-03 ENCOUNTER — Encounter (HOSPITAL_COMMUNITY): Payer: Self-pay | Admitting: *Deleted

## 2012-06-03 DIAGNOSIS — N949 Unspecified condition associated with female genital organs and menstrual cycle: Secondary | ICD-10-CM | POA: Insufficient documentation

## 2012-06-03 DIAGNOSIS — R109 Unspecified abdominal pain: Secondary | ICD-10-CM | POA: Insufficient documentation

## 2012-06-03 HISTORY — DX: Gestational (pregnancy-induced) hypertension without significant proteinuria, unspecified trimester: O13.9

## 2012-06-03 HISTORY — DX: Carrier or suspected carrier of methicillin resistant Staphylococcus aureus: Z22.322

## 2012-06-03 HISTORY — DX: Major depressive disorder, single episode, unspecified: F32.9

## 2012-06-03 HISTORY — DX: Urinary tract infection, site not specified: N39.0

## 2012-06-03 HISTORY — DX: Depression, unspecified: F32.A

## 2012-06-03 LAB — CBC WITH DIFFERENTIAL/PLATELET
Basophils Relative: 0 % (ref 0–1)
Eosinophils Relative: 3 % (ref 0–5)
Hemoglobin: 12.7 g/dL (ref 12.0–15.0)
Lymphs Abs: 2.7 10*3/uL (ref 0.7–4.0)
MCH: 30.5 pg (ref 26.0–34.0)
MCV: 93 fL (ref 78.0–100.0)
Monocytes Absolute: 0.5 10*3/uL (ref 0.1–1.0)
Neutrophils Relative %: 56 % (ref 43–77)
RBC: 4.17 MIL/uL (ref 3.87–5.11)

## 2012-06-03 LAB — COMPREHENSIVE METABOLIC PANEL
Alkaline Phosphatase: 64 U/L (ref 39–117)
BUN: 7 mg/dL (ref 6–23)
GFR calc Af Amer: >90 mL/min (ref >90)
GFR calc non Af Amer: >90 mL/min (ref >90)
Glucose, Bld: 96 mg/dL (ref 70–99)
Potassium: 4.3 mEq/L (ref 3.5–5.1)
Total Protein: 6.7 g/dL (ref 6.0–8.3)

## 2012-06-03 LAB — URINALYSIS, ROUTINE W REFLEX MICROSCOPIC
Nitrite: NEGATIVE
Specific Gravity, Urine: >1.03 — ABNORMAL HIGH (ref 1.005–1.030)
Urobilinogen, UA: 0.2 mg/dL (ref 0.0–1.0)

## 2012-06-03 LAB — URINE MICROSCOPIC-ADD ON

## 2012-06-03 LAB — POCT PREGNANCY, URINE: Preg Test, Ur: NEGATIVE

## 2012-06-03 NOTE — MAU Provider Note (Signed)
History     CSN: [id number]  Arrival date and time: 06/03/12 1020   None     Chief Complaint  Patient presents with  . Abdominal Pain  . Bump near vagina    HPI KHRISTY KALAN is a 27 y.o. female who presents to MAU with abdominal pain. The pain started about a week ago. She describes the pain as sharp that comes and goes. The pain is similar to when she had an ovarian cyst. The pain is located in the lower abdomen. She rates the pain as 6/10. Patient's last menstrual period was 02/19/2012. Last pap smear 4 years ago and was normal. Current sex partner x 3 years. Hx of trichomonas. She denies vaginal bleeding, discharge, fever, chills, nausea, vomiting or other problems.   OB History   Grav Para Term Preterm Abortions TAB SAB Ect Mult Living   2 1 0 1 1   1  1       Past Medical History  Diagnosis Date  . Pregnancy induced hypertension   . Urinary tract infection   . Depression     hx of meds, none currently- doing ok    Past Surgical History  Procedure Laterality Date  . Ectopic pregnancy surgery    . Cholecystectomy      Family History  Problem Relation Age of Onset  . Hypertension Mother   . Diabetes Mother   . Asthma Father   . Hypertension Father   . Stroke Maternal Grandmother   . Diabetes Paternal Grandmother   . Hypertension Paternal Grandmother     History  Substance Use Topics  . Smoking status: Current Every Day Smoker -- 0.50 packs/day for 9 years    Types: Cigarettes  . Smokeless tobacco: Never Used  . Alcohol Use: Yes     Comment: occasional    Allergies: No Known Allergies  Prescriptions prior to admission  Medication Sig Dispense Refill  . diphenhydramine-acetaminophen (TYLENOL PM) 25-500 MG TABS Take 1 tablet by mouth at bedtime as needed. For sleep        Review of Systems  Constitutional: Negative for fever, chills and weight loss.  HENT: Negative for ear pain, nosebleeds, congestion, sore throat and neck pain.   Eyes: Negative for  blurred vision, double vision, photophobia and pain.  Respiratory: Negative for cough, shortness of breath and wheezing.   Cardiovascular: Negative for chest pain, palpitations and leg swelling.  Gastrointestinal: Positive for abdominal pain. Negative for heartburn, nausea, vomiting, diarrhea and constipation.  Genitourinary: Positive for frequency. Negative for dysuria and urgency.  Musculoskeletal: Negative for myalgias and back pain.  Skin: Negative for itching and rash.  Neurological: Negative for dizziness, sensory change, speech change, seizures, weakness and headaches.  Endo/Heme/Allergies: Does not bruise/bleed easily.  Psychiatric/Behavioral: Positive for depression (not on any medication). Negative for substance abuse. The patient is not nervous/anxious and does not have insomnia.    Physical Exam   Blood pressure 121/71, pulse 82, temperature 98 F (36.7 C), temperature source Oral, resp. rate 20, last menstrual period 02/19/2012.  Physical Exam  Nursing note and vitals reviewed. Constitutional: She is oriented to person, place, and time. She appears well-developed and well-nourished. No distress.  HENT:  Head: Normocephalic and atraumatic.  Eyes: EOM are normal.  Neck: Neck supple.  Cardiovascular: Normal rate.   Respiratory: Effort normal.  GI: Soft. There is no tenderness.  Unable to reproduce the abdominal pain with deep palpation. Patient states the pain has gone.  Genitourinary:  External  genitalia without lesions. White discharge vaginal vault. Cervix closed, no CMT, no adnexal tenderness. Uterus without palpable enlargement.  Musculoskeletal: Normal range of motion.  Neurological: She is alert and oriented to person, place, and time.  Skin: Skin is warm and dry.  Psychiatric: She has a normal mood and affect. Her behavior is normal. Judgment and thought content normal.   Procedures Results for orders placed during the hospital encounter of 06/03/12 (from the past  24 hour(s))  URINALYSIS, ROUTINE W REFLEX MICROSCOPIC     Status: Abnormal   Collection Time    06/03/12 10:32 AM      Result Value Range   Color, Urine YELLOW  YELLOW   APPearance CLEAR  CLEAR   Specific Gravity, Urine >1.030 (*) 1.005 - 1.030   pH 6.0  5.0 - 8.0   Glucose, UA NEGATIVE  NEGATIVE mg/dL   Hgb urine dipstick TRACE (*) NEGATIVE   Bilirubin Urine NEGATIVE  NEGATIVE   Ketones, ur NEGATIVE  NEGATIVE mg/dL   Protein, ur NEGATIVE  NEGATIVE mg/dL   Urobilinogen, UA 0.2  0.0 - 1.0 mg/dL   Nitrite NEGATIVE  NEGATIVE   Leukocytes, UA TRACE (*) NEGATIVE  URINE MICROSCOPIC-ADD ON     Status: None   Collection Time    06/03/12 10:32 AM      Result Value Range   Squamous Epithelial / LPF RARE  RARE   WBC, UA 0-2  <3 WBC/hpf   Bacteria, UA RARE  RARE  POCT PREGNANCY, URINE     Status: None   Collection Time    06/03/12 11:29 AM      Result Value Range   Preg Test, Ur NEGATIVE  NEGATIVE  CBC WITH DIFFERENTIAL     Status: None   Collection Time    06/03/12 11:50 AM      Result Value Range   WBC 7.8  4.0 - 10.5 K/uL   RBC 4.17  3.87 - 5.11 MIL/uL   Hemoglobin 12.7  12.0 - 15.0 g/dL   HCT 45.4  09.8 - 11.9 %   MCV 93.0  78.0 - 100.0 fL   MCH 30.5  26.0 - 34.0 pg   MCHC 32.7  30.0 - 36.0 g/dL   RDW 14.7  82.9 - 56.2 %   Platelets 271  150 - 400 K/uL   Neutrophils Relative 56  43 - 77 %   Lymphocytes Relative 34  12 - 46 %   Monocytes Relative 7  3 - 12 %   Eosinophils Relative 3  0 - 5 %   Basophils Relative 0  0 - 1 %   Neutro Abs 4.4  1.7 - 7.7 K/uL   Lymphs Abs 2.7  0.7 - 4.0 K/uL   Monocytes Absolute 0.5  0.1 - 1.0 K/uL   Eosinophils Absolute 0.2  0.0 - 0.7 K/uL   Basophils Absolute 0.0  0.0 - 0.1 K/uL  COMPREHENSIVE METABOLIC PANEL     Status: Abnormal   Collection Time    06/03/12 11:50 AM      Result Value Range   Sodium 137  135 - 145 mEq/L   Potassium 4.3  3.5 - 5.1 mEq/L   Chloride 102  96 - 112 mEq/L   CO2 25  19 - 32 mEq/L   Glucose, Bld 96  70 - 99  mg/dL   BUN 7  6 - 23 mg/dL   Creatinine, Ser 1.30  0.50 - 1.10 mg/dL   Calcium 9.5  8.4 -  10.5 mg/dL   Total Protein 6.7  6.0 - 8.3 g/dL   Albumin 3.5  3.5 - 5.2 g/dL   AST 17  0 - 37 U/L   ALT 24  0 - 35 U/L   Alkaline Phosphatase 64  39 - 117 U/L   Total Bilirubin 0.2 (*) 0.3 - 1.2 mg/dL   GFR calc non Af Amer >90  >90 mL/min   GFR calc Af Amer >90  >90 mL/min  WET PREP, GENITAL     Status: Abnormal   Collection Time    06/03/12 12:02 PM      Result Value Range   Yeast Wet Prep HPF POC NONE SEEN  NONE SEEN   Trich, Wet Prep NONE SEEN  NONE SEEN   Clue Cells Wet Prep HPF POC FEW (*) NONE SEEN   WBC, Wet Prep HPF POC MODERATE (*) NONE SEEN    Assessment: 27 y.o. female with abdominal pain by history  Plan:  Encouraged patient to follow up for pap smear and yearly physical   Discussed with the patient and all questioned fully answered. She return if any problems arise.     Genevra Orne, RN, FNP, [facility] 06/03/2012, 11:39 AM

## 2012-06-03 NOTE — MAU Note (Signed)
Pt states has had bump near groin x4-5 days, is unsure of pregnancy, has also had right lower abd pain tghat feels like a bruise, and intermittent stomach pain that "feels like someone is squeezing my stomach from the inside".

## 2012-06-03 NOTE — MAU Provider Note (Signed)
Attestation of Attending Supervision of Advanced Practitioner (PA/CNM/NP): Evaluation and management procedures were performed by the Advanced Practitioner under my supervision and collaboration.  I have reviewed the Advanced Practitioner's note and chart, and I agree with the management and plan.  Cashtyn Pouliot, MD, FACOG Attending Obstetrician & Gynecologist Faculty Practice, [facility]  

## 2012-06-03 NOTE — MAU Note (Signed)
Abd pain started about 2 wks ago- hurts all over, feels like someone is squeezing inside of stomach.  Noted bump about 4-5days ago, groin area tender to touch- "feels bruised".  Pt states it 'burst" when she sat down on the bed, bloody yellow drainage.

## 2012-06-04 LAB — GC/CHLAMYDIA PROBE AMP: CT Probe RNA: NEGATIVE

## 2012-08-05 ENCOUNTER — Encounter (HOSPITAL_COMMUNITY): Payer: Self-pay | Admitting: Emergency Medicine

## 2012-08-05 DIAGNOSIS — R197 Diarrhea, unspecified: Secondary | ICD-10-CM | POA: Insufficient documentation

## 2012-08-05 DIAGNOSIS — F17200 Nicotine dependence, unspecified, uncomplicated: Secondary | ICD-10-CM | POA: Insufficient documentation

## 2012-08-05 DIAGNOSIS — Z3202 Encounter for pregnancy test, result negative: Secondary | ICD-10-CM | POA: Insufficient documentation

## 2012-08-05 DIAGNOSIS — Z8659 Personal history of other mental and behavioral disorders: Secondary | ICD-10-CM | POA: Insufficient documentation

## 2012-08-05 DIAGNOSIS — R112 Nausea with vomiting, unspecified: Secondary | ICD-10-CM | POA: Insufficient documentation

## 2012-08-05 DIAGNOSIS — Z8614 Personal history of Methicillin resistant Staphylococcus aureus infection: Secondary | ICD-10-CM | POA: Insufficient documentation

## 2012-08-05 DIAGNOSIS — Z87440 Personal history of urinary (tract) infections: Secondary | ICD-10-CM | POA: Insufficient documentation

## 2012-08-05 LAB — CBC WITH DIFFERENTIAL/PLATELET
Basophils Absolute: 0 10*3/uL (ref 0.0–0.1)
Basophils Relative: 0 % (ref 0–1)
Eosinophils Relative: 2 % (ref 0–5)
Lymphocytes Relative: 44 % (ref 12–46)
Neutro Abs: 3.7 10*3/uL (ref 1.7–7.7)
Platelets: 275 10*3/uL (ref 150–400)
RDW: 12.7 % (ref 11.5–15.5)
WBC: 7.7 10*3/uL (ref 4.0–10.5)

## 2012-08-05 NOTE — ED Notes (Signed)
PT. REPORTS EMESIS WITH DIARRHEA ONSET 3 DAYS AGO AFTER EATING " BAD" FOOD.

## 2012-08-06 ENCOUNTER — Emergency Department (HOSPITAL_COMMUNITY)
Admission: EM | Admit: 2012-08-06 | Discharge: 2012-08-06 | Disposition: A | Discharge status: Home / Self Care | Payer: Self-pay | Attending: Emergency Medicine | Admitting: Emergency Medicine

## 2012-08-06 DIAGNOSIS — R112 Nausea with vomiting, unspecified: Secondary | ICD-10-CM

## 2012-08-06 DIAGNOSIS — R197 Diarrhea, unspecified: Secondary | ICD-10-CM

## 2012-08-06 LAB — COMPREHENSIVE METABOLIC PANEL
ALT: 33 U/L (ref 0–35)
AST: 21 U/L (ref 0–37)
Albumin: 4.1 g/dL (ref 3.5–5.2)
CO2: 28 mEq/L (ref 19–32)
Calcium: 9.2 mg/dL (ref 8.4–10.5)
Chloride: 105 mEq/L (ref 96–112)
GFR calc non Af Amer: >90 mL/min (ref >90)
Sodium: 142 mEq/L (ref 135–145)

## 2012-08-06 LAB — POCT PREGNANCY, URINE: Preg Test, Ur: NEGATIVE

## 2012-08-06 MED ORDER — PROMETHAZINE HCL 25 MG PO TABS: 25.0000 mg | ORAL_TABLET | Freq: Four times a day (QID) | ORAL | Status: DC | PRN

## 2012-08-06 MED ORDER — SODIUM CHLORIDE 0.9 % IV BOLUS (SEPSIS)
2000.0000 mL | Freq: Once | INTRAVENOUS | Status: AC
  Administered 2012-08-06: 2000 mL via INTRAVENOUS

## 2012-08-06 MED ORDER — ONDANSETRON HCL 4 MG/2ML IJ SOLN
4.0000 mg | Freq: Once | INTRAMUSCULAR | Status: AC
  Administered 2012-08-06: 4 mg via INTRAVENOUS
  Filled 2012-08-06: qty 2

## 2012-08-06 NOTE — ED Provider Notes (Signed)
History     CSN: [id number]  Arrival date & time 08/05/12  2326   First MD Initiated Contact with Patient 08/06/12 0024      Chief Complaint  Patient presents with  . Emesis  . Diarrhea    The history is provided by the patient.   patient reports 3 days of nausea vomiting and diarrhe no cough or congestion.  No shortness of breath.  Nonbloody bilious vomit.  Her symptoms are mild.  Symptoms are constant.  No recent sick contacts.  No recent antibiotics.  If fevers or chills.  No abdominal pain. she thinks this started after eating "bad food" .   Past Medical History  Diagnosis Date  . Pregnancy induced hypertension   . Urinary tract infection   . Depression     hx of meds, none currently- doing ok  . MRSA (methicillin resistant staph aureus) culture positive     Dec 2012    Past Surgical History  Procedure Laterality Date  . Ectopic pregnancy surgery    . Cholecystectomy      Family History  Problem Relation Age of Onset  . Hypertension Mother   . Diabetes Mother   . Asthma Father   . Hypertension Father   . Stroke Maternal Grandmother   . Diabetes Paternal Grandmother   . Hypertension Paternal Grandmother     History  Substance Use Topics  . Smoking status: Current Every Day Smoker -- 0.50 packs/day for 9 years    Types: Cigarettes  . Smokeless tobacco: Never Used  . Alcohol Use: Yes     Comment: occasional    OB History   Grav Para Term Preterm Abortions TAB SAB Ect Mult Living   2 1 0 1 1   1  1       Review of Systems  Gastrointestinal: Positive for vomiting and diarrhea.  All other systems reviewed and are negative.    Allergies  Review of patient's allergies indicates no known allergies.  Home Medications   Current Outpatient Rx  Name  Route  Sig  Dispense  Refill  . diphenhydramine-acetaminophen (TYLENOL PM) 25-500 MG TABS   Oral   Take 1 tablet by mouth at bedtime as needed. For sleep         . promethazine (PHENERGAN) 25 MG  tablet   Oral   Take 1 tablet (25 mg total) by mouth every 6 (six) hours as needed for nausea.   12 tablet   0     BP 126/68  Pulse 77  Temp(Src) 98.6 F (37 C) (Oral)  Resp 14  SpO2 97%  Physical Exam  Nursing note and vitals reviewed. Constitutional: She is oriented to person, place, and time. She appears well-developed and well-nourished. No distress.  HENT:  Head: Normocephalic and atraumatic.  Eyes: EOM are normal.  Neck: Normal range of motion.  Cardiovascular: Normal rate, regular rhythm and normal heart sounds.   Pulmonary/Chest: Effort normal and breath sounds normal.  Abdominal: Soft. She exhibits no distension. There is no tenderness.  Musculoskeletal: Normal range of motion.  Neurological: She is alert and oriented to person, place, and time.  Skin: Skin is warm and dry.  Psychiatric: She has a normal mood and affect. Judgment normal.    ED Course  Procedures (including critical care time)  Labs Reviewed  COMPREHENSIVE METABOLIC PANEL - Abnormal; Notable for the following:    BUN 5 (*)    All other components within normal limits  CBC WITH DIFFERENTIAL  POCT PREGNANCY, URINE   No results found.   1. Nausea vomiting and diarrhea       MDM  2:06 AM Patient feels better after IV fluids.  Tolerating oral fluids in the emergency department.  Discharge home.  Abdominal exam is benign.        Lyanne Co, MD 08/06/12 [phone]

## 2012-08-06 NOTE — ED Notes (Signed)
Patient with nausea for last three days.  No vomiting.  Patient states she ate out 3 days ago and since then has not been feeling well.  Patient is CAOx3 at this time.

## 2012-09-06 ENCOUNTER — Emergency Department (HOSPITAL_COMMUNITY)
Admission: EM | Admit: 2012-09-06 | Discharge: 2012-09-06 | Disposition: A | Discharge status: Home / Self Care | Payer: Self-pay | Attending: Emergency Medicine | Admitting: Emergency Medicine

## 2012-09-06 ENCOUNTER — Encounter (HOSPITAL_COMMUNITY): Payer: Self-pay | Admitting: *Deleted

## 2012-09-06 DIAGNOSIS — M545 Low back pain, unspecified: Secondary | ICD-10-CM | POA: Insufficient documentation

## 2012-09-06 DIAGNOSIS — R6883 Chills (without fever): Secondary | ICD-10-CM | POA: Insufficient documentation

## 2012-09-06 DIAGNOSIS — Z8614 Personal history of Methicillin resistant Staphylococcus aureus infection: Secondary | ICD-10-CM | POA: Insufficient documentation

## 2012-09-06 DIAGNOSIS — Z87440 Personal history of urinary (tract) infections: Secondary | ICD-10-CM | POA: Insufficient documentation

## 2012-09-06 DIAGNOSIS — F17200 Nicotine dependence, unspecified, uncomplicated: Secondary | ICD-10-CM | POA: Insufficient documentation

## 2012-09-06 DIAGNOSIS — R05 Cough: Secondary | ICD-10-CM | POA: Insufficient documentation

## 2012-09-06 DIAGNOSIS — J329 Chronic sinusitis, unspecified: Secondary | ICD-10-CM | POA: Insufficient documentation

## 2012-09-06 DIAGNOSIS — R059 Cough, unspecified: Secondary | ICD-10-CM | POA: Insufficient documentation

## 2012-09-06 DIAGNOSIS — J069 Acute upper respiratory infection, unspecified: Secondary | ICD-10-CM | POA: Insufficient documentation

## 2012-09-06 DIAGNOSIS — R1310 Dysphagia, unspecified: Secondary | ICD-10-CM | POA: Insufficient documentation

## 2012-09-06 MED ORDER — PSEUDOEPHEDRINE HCL 30 MG PO TABS: 30.0000 mg | ORAL_TABLET | ORAL | Status: DC | PRN

## 2012-09-06 MED ORDER — GUAIFENESIN 100 MG/5ML PO LIQD: 100.0000 mg | ORAL | Status: DC | PRN

## 2012-09-06 MED ORDER — AMOXICILLIN 500 MG PO CAPS: 500.0000 mg | ORAL_CAPSULE | Freq: Three times a day (TID) | ORAL | Status: DC

## 2012-09-06 NOTE — ED Notes (Signed)
Pt states around Tuesday she began having low back pain and nasal congestion and coughing,  States she has taken OTC medication without relief,  Pt is alert and oriented in NAD

## 2012-09-06 NOTE — ED Provider Notes (Signed)
History    This chart was scribed for Ann Moran (PA) non-physician practitioner working with Hilario Quarry, MD by Sofie Rower, ED Scribe. This patient was seen in room WTR9/WTR9 and the patient's care was started at 8:36PM.   CSN: [id number]  Arrival date & time 09/06/12  2014   First MD Initiated Contact with Patient 09/06/12 2036      Chief Complaint  Patient presents with  . Nasal Congestion  . Back Pain    (Consider location/radiation/quality/duration/timing/severity/associated sxs/prior treatment) The history is provided by the patient. No language interpreter was used.    Ann Moran is a 27 y.o. female , with a hx of pregnancy induced hypertension, UTI, depression, MRSA, ectopic pregnancy surgery, and cholecystectomy, who presents to the Emergency Department complaining of gradual, progressively worsening, nasal congestion, onset four days ago (09/02/12).  Associated symptoms include difficulty swallowing, chills, and productive yellow cough. The pt reports she has been experiencing nasal congestion for the past four days, progressively becoming worse, prompting her concern and desire to seek medical evaluation at [facility] this evening (09/06/12). The pt has taken Nyquil, which she informs, does not provide relief of her nasal congestion.  The pt denies fever.   The pt is a current everyday smoker, in addition to drinking alcohol occasionally.   Pt does not have a PCP.     Past Medical History  Diagnosis Date  . Pregnancy induced hypertension   . Urinary tract infection   . Depression     hx of meds, none currently- doing ok  . MRSA (methicillin resistant staph aureus) culture positive     Dec 2012    Past Surgical History  Procedure Laterality Date  . Ectopic pregnancy surgery    . Cholecystectomy      Family History  Problem Relation Age of Onset  . Hypertension Mother   . Diabetes Mother   . Asthma Father   . Hypertension Father   . Stroke Maternal  Grandmother   . Diabetes Paternal Grandmother   . Hypertension Paternal Grandmother     History  Substance Use Topics  . Smoking status: Current Every Day Smoker -- 0.50 packs/day for 9 years    Types: Cigarettes  . Smokeless tobacco: Never Used  . Alcohol Use: Yes     Comment: occasional    OB History   Grav Para Term Preterm Abortions TAB SAB Ect Mult Living   2 1 0 1 1   1  1       Review of Systems  Constitutional: Positive for chills. Negative for fever.  HENT: Positive for congestion, trouble swallowing and sinus pressure.   Respiratory: Positive for cough.     Allergies  Review of patient's allergies indicates no known allergies.  Home Medications   Current Outpatient Rx  Name  Route  Sig  Dispense  Refill  . Phenylephrine-Pheniramine-DM (THERAFLU COLD & COUGH) 02-10-19 MG PACK   Oral   Take 1 packet by mouth as needed (cold symptons).           BP 126/83  Pulse 61  Temp(Src) 98.2 F (36.8 C) (Oral)  Resp 18  Ht 5\' 7"  (1.702 m)  Wt 288 lb (130.636 kg)  BMI 45.1 kg/m2  SpO2 100%  LMP 08/25/2012  Physical Exam  Nursing note and vitals reviewed. Constitutional: She is oriented to person, place, and time. She appears well-developed and well-nourished. No distress.  HENT:  Head: Normocephalic and atraumatic.  Right Ear: Tympanic membrane, external  ear and ear canal normal.  Left Ear: Tympanic membrane, external ear and ear canal normal.  Mouth/Throat: Uvula is midline. Posterior oropharyngeal erythema present. No oropharyngeal exudate.  Tender over the maxillary sinus' bilaterally.   Eyes: EOM are normal.  Neck: Normal range of motion. Neck supple. No tracheal deviation present.  Cardiovascular: Normal rate, regular rhythm and normal heart sounds.  Exam reveals no gallop and no friction rub.   No murmur heard. Pulmonary/Chest: Effort normal and breath sounds normal. No respiratory distress. She has no wheezes.  Musculoskeletal: Normal range of  motion.  Lymphadenopathy:    She has no cervical adenopathy.  Neurological: She is alert and oriented to person, place, and time.  Skin: Skin is warm and dry.  Psychiatric: She has a normal mood and affect. Her behavior is normal.    ED Course  Procedures (including critical care time)  DIAGNOSTIC STUDIES: Oxygen Saturation is 100% on room air, normal by my interpretation.    COORDINATION OF CARE:  8:52 PM- Treatment plan discussed with patient. Pt agrees with treatment.     Results for orders placed during the hospital encounter of 09/06/12  RAPID STREP SCREEN      Result Value Range   Streptococcus, Group A Screen (Direct) NEGATIVE  NEGATIVE    No results found.   1. Sinusitis   2. URI (upper respiratory infection)       MDM  Exam consistent with sinusitis and URI. Strep negative. Pt states green drainage from nose. Will start on amoxil given symptoms for almost a week with purulent drainage and sinus tenderness. Continue symptomatic URI treatment with robitussin and sudafed. No meningismus. No peritonsillar abscess. No other concerning symptoms or exam findings. VS normal.   Filed Vitals:   09/06/12 2032  BP: 126/83  Pulse: 61  Temp: 98.2 F (36.8 C)  TempSrc: Oral  Resp: 18  Height: 5\' 7"  (1.702 m)  Weight: 288 lb (130.636 kg)  SpO2: 100%    I personally performed the services described in this documentation, which was scribed in my presence. The recorded information has been reviewed and is accurate.    Lottie Mussel, PA-C 09/06/12 2147  Lottie Mussel, PA-C 09/06/12 2147

## 2012-09-08 NOTE — ED Provider Notes (Signed)
History/physical exam/procedure(s) were performed by non-physician practitioner and as supervising physician I was immediately available for consultation/collaboration. I have reviewed all notes and am in agreement with care and plan.   Hilario Quarry, MD 09/08/12 [phone]

## 2012-11-21 ENCOUNTER — Encounter (HOSPITAL_COMMUNITY): Payer: Self-pay | Admitting: Emergency Medicine

## 2012-11-21 ENCOUNTER — Emergency Department (HOSPITAL_COMMUNITY)
Admission: EM | Admit: 2012-11-21 | Discharge: 2012-11-21 | Disposition: A | Discharge status: Home / Self Care | Payer: Self-pay | Attending: Emergency Medicine | Admitting: Emergency Medicine

## 2012-11-21 ENCOUNTER — Emergency Department (HOSPITAL_COMMUNITY): Payer: Self-pay

## 2012-11-21 DIAGNOSIS — S60229A Contusion of unspecified hand, initial encounter: Secondary | ICD-10-CM | POA: Insufficient documentation

## 2012-11-21 DIAGNOSIS — Z8659 Personal history of other mental and behavioral disorders: Secondary | ICD-10-CM | POA: Insufficient documentation

## 2012-11-21 DIAGNOSIS — F17200 Nicotine dependence, unspecified, uncomplicated: Secondary | ICD-10-CM | POA: Insufficient documentation

## 2012-11-21 DIAGNOSIS — W230XXA Caught, crushed, jammed, or pinched between moving objects, initial encounter: Secondary | ICD-10-CM | POA: Insufficient documentation

## 2012-11-21 DIAGNOSIS — Z792 Long term (current) use of antibiotics: Secondary | ICD-10-CM | POA: Insufficient documentation

## 2012-11-21 DIAGNOSIS — S60221A Contusion of right hand, initial encounter: Secondary | ICD-10-CM

## 2012-11-21 DIAGNOSIS — Z22322 Carrier or suspected carrier of Methicillin resistant Staphylococcus aureus: Secondary | ICD-10-CM | POA: Insufficient documentation

## 2012-11-21 DIAGNOSIS — Z87440 Personal history of urinary (tract) infections: Secondary | ICD-10-CM | POA: Insufficient documentation

## 2012-11-21 DIAGNOSIS — Y939 Activity, unspecified: Secondary | ICD-10-CM | POA: Insufficient documentation

## 2012-11-21 DIAGNOSIS — Y9289 Other specified places as the place of occurrence of the external cause: Secondary | ICD-10-CM | POA: Insufficient documentation

## 2012-11-21 MED ORDER — HYDROCODONE-ACETAMINOPHEN 5-325 MG PO TABS
1.0000 | ORAL_TABLET | Freq: Once | ORAL | Status: AC
  Administered 2012-11-21: 1 via ORAL
  Filled 2012-11-21: qty 1

## 2012-11-21 NOTE — ED Provider Notes (Signed)
Medical screening examination/treatment/procedure(s) were performed by non-physician practitioner and as supervising physician I was immediately available for consultation/collaboration.  Lyanne Co, MD 11/21/12 [phone]

## 2012-11-21 NOTE — ED Provider Notes (Signed)
CSN: [id number]     Arrival date & time 11/21/12  0224 History     First MD Initiated Contact with Patient 11/21/12 0235     Chief Complaint  Patient presents with  . Hand Pain   HPI  History provided by the patient. Patient is a 27 year old female who presents with complaints of persistent pain and swelling to right hand after injury. Patient reports hitting the side of her right hand against a car door. Accident occurred approximately one week ago. The patient had immediate pain and some swelling to the area. There was no damage to the skin or bleeding at that time. She had some improvement of the swelling but swelling has persisted. She also has continued tenderness. She has taken Tylenol for symptoms which has helped some. She denies any weakness or numbness to the hand or fingers. Denies any other injury or complaint. No other aggravating or alleviating factors. No other associated symptoms.    Past Medical History  Diagnosis Date  . Pregnancy induced hypertension   . Urinary tract infection   . Depression     hx of meds, none currently- doing ok  . MRSA (methicillin resistant staph aureus) culture positive     Dec 2012   Past Surgical History  Procedure Laterality Date  . Ectopic pregnancy surgery    . Cholecystectomy     Family History  Problem Relation Age of Onset  . Hypertension Mother   . Diabetes Mother   . Asthma Father   . Hypertension Father   . Stroke Maternal Grandmother   . Diabetes Paternal Grandmother   . Hypertension Paternal Grandmother    History  Substance Use Topics  . Smoking status: Current Every Day Smoker -- 0.50 packs/day for 9 years    Types: Cigarettes  . Smokeless tobacco: Never Used  . Alcohol Use: Yes     Comment: occasional   OB History   Grav Para Term Preterm Abortions TAB SAB Ect Mult Living   2 1 0 1 1   1  1      Review of Systems  Neurological: Negative for weakness and numbness.  All other systems reviewed and are  negative.    Allergies  Review of patient's allergies indicates no known allergies.  Home Medications   Current Outpatient Rx  Name  Route  Sig  Dispense  Refill  . amoxicillin (AMOXIL) 500 MG capsule   Oral   Take 1 capsule (500 mg total) by mouth 3 (three) times daily.   21 capsule   0   . guaiFENesin (ROBITUSSIN) 100 MG/5ML liquid   Oral   Take 5-10 mLs (100-200 mg total) by mouth every 4 (four) hours as needed for cough.   60 mL   0   . Phenylephrine-Pheniramine-DM (THERAFLU COLD & COUGH) 02-10-19 MG PACK   Oral   Take 1 packet by mouth as needed (cold symptons).         . pseudoephedrine (SUDAFED) 30 MG tablet   Oral   Take 1 tablet (30 mg total) by mouth every 4 (four) hours as needed for congestion.   30 tablet   0    BP 146/78  Pulse 87  Temp(Src) 98.1 F (36.7 C) (Oral)  Resp 18  Ht 5\' 7"  (1.702 m)  Wt 299 lb (135.626 kg)  BMI 46.82 kg/m2  SpO2 99% Physical Exam  Nursing note and vitals reviewed. Constitutional: She is oriented to person, place, and time. She appears well-developed and well-nourished.  No distress.  HENT:  Head: Normocephalic.  Eyes: Conjunctivae are normal.  Cardiovascular: Normal rate and regular rhythm.   Pulmonary/Chest: Effort normal and breath sounds normal. No respiratory distress.  Musculoskeletal: Normal range of motion. She exhibits edema and tenderness.  Small amount of swelling and tenderness to the dorsal aspect of the right hand overlying the proximal fifth metacarpal. No appreciable gross deformities of the bone. Normal range of motion of the digits of the right hand. Normal distal sensations in Cap Refill. Normal grip strength.  Neurological: She is alert and oriented to person, place, and time.  Skin: Skin is warm and dry. No rash noted.  Psychiatric: She has a normal mood and affect. Her behavior is normal.    ED Course   Procedures     Dg Hand Complete Right  11/21/2012   *RADIOLOGY REPORT*  Clinical Data:  Right hand trauma.  Fifth metacarpal pain.  RIGHT HAND - COMPLETE 3+ VIEW  Comparison: None.  Findings: Anatomic alignment bones of the right hand.  Fifth metacarpal is intact.  There is soft tissue swelling over the dorsum of the hand.  No fracture.  MCP joints appear within normal limits.  IMPRESSION: No acute osseous injury.  Negative for boxers fracture.   Original Report Authenticated By: Andreas Newport, M.D.     1. Contusion of hand, right, initial encounter     MDM  2:30AM patient seen and evaluated. Patient appears well no acute distress.   X-rays reviewed. No signs of broken bones or other concerning injury. Patient advised to continue Rice treatment.   Angus Seller, PA-C 11/21/12 [phone]

## 2012-11-21 NOTE — ED Notes (Signed)
Pt states she struck her right hand on the side of a door about a week ago and pt states it is still painful and she feels a knot in it  Pt is c/o pain to the outside of her right hand near the wrist area

## 2013-01-27 ENCOUNTER — Encounter: Payer: Self-pay | Admitting: *Deleted

## 2013-02-09 NOTE — Telephone Encounter (Signed)
Erroneous encounter

## 2013-05-05 ENCOUNTER — Encounter (HOSPITAL_COMMUNITY): Payer: Self-pay | Admitting: Emergency Medicine

## 2013-05-05 ENCOUNTER — Emergency Department (HOSPITAL_COMMUNITY)
Admission: EM | Admit: 2013-05-05 | Discharge: 2013-05-05 | Disposition: A | Discharge status: Home / Self Care | Payer: Self-pay | Attending: Emergency Medicine | Admitting: Emergency Medicine

## 2013-05-05 DIAGNOSIS — K0889 Other specified disorders of teeth and supporting structures: Secondary | ICD-10-CM

## 2013-05-05 DIAGNOSIS — Z8659 Personal history of other mental and behavioral disorders: Secondary | ICD-10-CM | POA: Insufficient documentation

## 2013-05-05 DIAGNOSIS — K029 Dental caries, unspecified: Secondary | ICD-10-CM | POA: Insufficient documentation

## 2013-05-05 DIAGNOSIS — K089 Disorder of teeth and supporting structures, unspecified: Secondary | ICD-10-CM | POA: Insufficient documentation

## 2013-05-05 DIAGNOSIS — Z8614 Personal history of Methicillin resistant Staphylococcus aureus infection: Secondary | ICD-10-CM | POA: Insufficient documentation

## 2013-05-05 DIAGNOSIS — Z98811 Dental restoration status: Secondary | ICD-10-CM | POA: Insufficient documentation

## 2013-05-05 DIAGNOSIS — K047 Periapical abscess without sinus: Secondary | ICD-10-CM | POA: Insufficient documentation

## 2013-05-05 DIAGNOSIS — Z8679 Personal history of other diseases of the circulatory system: Secondary | ICD-10-CM | POA: Insufficient documentation

## 2013-05-05 DIAGNOSIS — F17200 Nicotine dependence, unspecified, uncomplicated: Secondary | ICD-10-CM | POA: Insufficient documentation

## 2013-05-05 DIAGNOSIS — Z87440 Personal history of urinary (tract) infections: Secondary | ICD-10-CM | POA: Insufficient documentation

## 2013-05-05 MED ORDER — PENICILLIN V POTASSIUM 500 MG PO TABS: 500.0000 mg | ORAL_TABLET | Freq: Four times a day (QID) | ORAL | Status: AC

## 2013-05-05 MED ORDER — HYDROCODONE-ACETAMINOPHEN 5-325 MG PO TABS: 1.0000 | ORAL_TABLET | ORAL | Status: DC | PRN

## 2013-05-05 MED ORDER — HYDROCODONE-ACETAMINOPHEN 5-325 MG PO TABS
1.0000 | ORAL_TABLET | Freq: Once | ORAL | Status: AC
  Administered 2013-05-05: 1 via ORAL
  Filled 2013-05-05: qty 1

## 2013-05-05 MED ORDER — PENICILLIN V POTASSIUM 500 MG PO TABS
500.0000 mg | ORAL_TABLET | Freq: Once | ORAL | Status: AC
  Administered 2013-05-05: 500 mg via ORAL
  Filled 2013-05-05: qty 1

## 2013-05-05 MED ORDER — IBUPROFEN 800 MG PO TABS: 800.0000 mg | ORAL_TABLET | Freq: Three times a day (TID) | ORAL | Status: DC

## 2013-05-05 NOTE — ED Provider Notes (Signed)
Medical screening examination/treatment/procedure(s) were performed by non-physician practitioner and as supervising physician I was immediately available for consultation/collaboration.  EKG Interpretation   None         Dailey Buccheri M Imara Standiford, DO 05/05/13 1513 

## 2013-05-05 NOTE — ED Provider Notes (Signed)
CSN: [id number]     Arrival date & time 05/05/13  0012 History   First MD Initiated Contact with Patient 05/05/13 0047     Chief Complaint  Patient presents with  . Abscess   HPI  History provided by the patient. Patient is a 28 year old female with no significant PMH who presents with complaints of worsening left dental pain and concerns for abscess infection. Patient states she began having some pain in the left upper molars 3-4 days ago. This became worse with swelling. Patient also states that she feels like there may be a canker sore or ulcer in the area when she tries to look. She denies having any difficulty breathing or swallowing. Denies any associated fever, chills or sweats. She was taking Advil for pain with some relief. No other aggravating or alleviating factors. No other associated symptoms.   Past Medical History  Diagnosis Date  . Pregnancy induced hypertension   . Urinary tract infection   . Depression     hx of meds, none currently- doing ok  . MRSA (methicillin resistant staph aureus) culture positive     Dec 2012   Past Surgical History  Procedure Laterality Date  . Ectopic pregnancy surgery    . Cholecystectomy     Family History  Problem Relation Age of Onset  . Hypertension Mother   . Diabetes Mother   . Asthma Father   . Hypertension Father   . Stroke Maternal Grandmother   . Diabetes Paternal Grandmother   . Hypertension Paternal Grandmother    History  Substance Use Topics  . Smoking status: Current Every Day Smoker -- 0.50 packs/day for 9 years    Types: Cigarettes  . Smokeless tobacco: Never Used  . Alcohol Use: Yes     Comment: occasional   OB History   Grav Para Term Preterm Abortions TAB SAB Ect Mult Living   2 1 0 1 1   1  1      Review of Systems  Constitutional: Negative for fever and chills.  HENT: Positive for dental problem and trouble swallowing. Negative for drooling and sore throat.   All other systems reviewed and are  negative.    Allergies  Review of patient's allergies indicates no known allergies.  Home Medications  No current outpatient prescriptions on file. BP 132/84  Pulse 72  Temp(Src) 97.9 F (36.6 C) (Oral)  Resp 16  SpO2 97%  LMP 04/26/2013 Physical Exam  Nursing note and vitals reviewed. Constitutional: She is oriented to person, place, and time. She appears well-developed and well-nourished. No distress.  HENT:  Head: Normocephalic.  Mouth/Throat: Uvula is midline. No trismus in the jaw.    Impacted left upper third molar. There is swelling of the lateral adjacent gums with small amounts of purulent drainage. Dental caries throughout.  Patient also with braces hardware attached to some teeth  Cardiovascular: Normal rate and regular rhythm.   Pulmonary/Chest: Effort normal and breath sounds normal.  Abdominal: Soft.  Neurological: She is alert and oriented to person, place, and time.  Skin: Skin is warm and dry. No rash noted.  Psychiatric: She has a normal mood and affect. Her behavior is normal.    ED Course  Procedures   DIAGNOSTIC STUDIES: Oxygen Saturation is 97% on room air.    COORDINATION OF CARE:  Nursing notes reviewed. Vital signs reviewed. Initial pt interview and examination performed.   12:57 AM-patient seen and evaluated. Patient appears well no acute distress. Does not appear severely  ill or toxic.   Patient will begin prescriptions for penicillin and Vicodin. Dental and oral surgery referrals provided. Strict return precautions given.  MDM   1. Dental abscess   2. Pain, dental   3. Dental caries        Angus Seller, PA-C 05/05/13 [phone]

## 2013-05-05 NOTE — ED Notes (Signed)
Pt states she thinks she has a dental abscess on the left side  Pt states her mouth hurts and she is not able to move the left side of her face  Pt states sxs started about 3 to 4 days ago

## 2013-05-05 NOTE — Discharge Instructions (Signed)
Please follow up with a dentist or oral surgeon for continued evaluation and treatment.  Return for any worsening symptoms.    Dental Abscess A dental abscess is a collection of infected fluid (pus) from a bacterial infection in the inner part of the tooth (pulp). It usually occurs at the end of the tooth's root.  CAUSES   Severe tooth decay.  Trauma to the tooth that allows bacteria to enter into the pulp, such as a broken or chipped tooth. SYMPTOMS   Severe pain in and around the infected tooth.  Swelling and redness around the abscessed tooth or in the mouth or face.  Tenderness.  Pus drainage.  Bad breath.  Bitter taste in the mouth.  Difficulty swallowing.  Difficulty opening the mouth.  Nausea.  Vomiting.  Chills.  Swollen neck glands. DIAGNOSIS   A medical and dental history will be taken.  An examination will be performed by tapping on the abscessed tooth.  X-rays may be taken of the tooth to identify the abscess. TREATMENT The goal of treatment is to eliminate the infection. You may be prescribed antibiotic medicine to stop the infection from spreading. A root canal may be performed to save the tooth. If the tooth cannot be saved, it may be pulled (extracted) and the abscess may be drained.  HOME CARE INSTRUCTIONS  Only take over-the-counter or prescription medicines for pain, fever, or discomfort as directed by your caregiver.  Rinse your mouth (gargle) often with salt water ( tsp salt in 8 oz [250 ml] of warm water) to relieve pain or swelling.  Do not drive after taking pain medicine (narcotics).  Do not apply heat to the outside of your face.  Return to your dentist for further treatment as directed. SEEK MEDICAL CARE IF:  Your pain is not helped by medicine.  Your pain is getting worse instead of better. SEEK IMMEDIATE MEDICAL CARE IF:  You have a fever or persistent symptoms for more than 2 3 days.  You have a fever and your symptoms  suddenly get worse.  You have chills or a very bad headache.  You have problems breathing or swallowing.  You have trouble opening your mouth.  You have swelling in the neck or around the eye. Document Released: 04/09/2005 Document Revised: 01/02/2012 Document Reviewed: 07/18/2010 [facility] LLCExitCare Patient Information 2014 [employer], [place].

## 2013-05-14 ENCOUNTER — Inpatient Hospital Stay (HOSPITAL_COMMUNITY)
Admission: AD | Admit: 2013-05-14 | Discharge: 2013-05-14 | Disposition: A | Discharge status: Home / Self Care | Payer: Self-pay | Source: Ambulatory Visit | Attending: Obstetrics & Gynecology | Admitting: Obstetrics & Gynecology

## 2013-05-14 ENCOUNTER — Encounter (HOSPITAL_COMMUNITY): Payer: Self-pay | Admitting: *Deleted

## 2013-05-14 DIAGNOSIS — B373 Candidiasis of vulva and vagina: Secondary | ICD-10-CM

## 2013-05-14 DIAGNOSIS — B3731 Acute candidiasis of vulva and vagina: Secondary | ICD-10-CM | POA: Insufficient documentation

## 2013-05-14 DIAGNOSIS — N898 Other specified noninflammatory disorders of vagina: Secondary | ICD-10-CM | POA: Insufficient documentation

## 2013-05-14 DIAGNOSIS — F17200 Nicotine dependence, unspecified, uncomplicated: Secondary | ICD-10-CM | POA: Insufficient documentation

## 2013-05-14 LAB — WET PREP, GENITAL: Trich, Wet Prep: NONE SEEN

## 2013-05-14 LAB — POCT PREGNANCY, URINE: Preg Test, Ur: NEGATIVE

## 2013-05-14 MED ORDER — FLUCONAZOLE 150 MG PO TABS: 150.0000 mg | ORAL_TABLET | Freq: Once | ORAL | Status: DC

## 2013-05-14 MED ORDER — FLUCONAZOLE 150 MG PO TABS
150.0000 mg | ORAL_TABLET | Freq: Once | ORAL | Status: AC
  Administered 2013-05-14: 150 mg via ORAL
  Filled 2013-05-14: qty 1

## 2013-05-14 NOTE — MAU Provider Note (Signed)
Chief Complaint: Vaginal Discharge   First Provider Initiated Contact with Patient 05/14/13 0250     SUBJECTIVE HPI: Ann Moran is a 28 y.o. G2P0111 female who presents with vaginal discharge and irritation since yesterday. Patient currently taking penicillin.  Denies fever, chills, dyspareunia. Does not have a gynecologist.  Past Medical History  Diagnosis Date  . Pregnancy induced hypertension   . Urinary tract infection   . Depression     hx of meds, none currently- doing ok  . MRSA (methicillin resistant staph aureus) culture positive     Dec 2012   OB History  Gravida Para Term Preterm AB SAB TAB Ectopic Multiple Living  2 1 0 1 1   1  1     # Outcome Date GA Lbr Len/2nd Weight Sex Delivery Anes PTL Lv  2 ECT           1 PRE     F SVD EPI N Y     Comments: PIH     Past Surgical History  Procedure Laterality Date  . Ectopic pregnancy surgery    . Cholecystectomy     History   Social History  . Marital Status: Single    Spouse Name: N/A    Number of Children: N/A  . Years of Education: N/A   Occupational History  . Not on file.   Social History Main Topics  . Smoking status: Current Every Day Smoker -- 0.50 packs/day for 9 years    Types: Cigarettes  . Smokeless tobacco: Never Used  . Alcohol Use: Yes     Comment: occasional  . Drug Use: No  . Sexual Activity: Yes    Birth Control/ Protection: None   Other Topics Concern  . Not on file   Social History Narrative  . No narrative on file   No current facility-administered medications on file prior to encounter.   Current Outpatient Prescriptions on File Prior to Encounter  Medication Sig Dispense Refill  . ibuprofen (ADVIL,MOTRIN) 800 MG tablet Take 1 tablet (800 mg total) by mouth 3 (three) times daily.  21 tablet  0  . HYDROcodone-acetaminophen (NORCO/VICODIN) 5-325 MG per tablet Take 1-2 tablets by mouth every 4 (four) hours as needed for moderate pain.  20 tablet  0   No Known  Allergies  ROS: Pertinent items in HPI  OBJECTIVE Blood pressure 124/85, pulse 87, temperature 98.2 F (36.8 C), temperature source Oral, resp. rate 18, last menstrual period 04/26/2013. GENERAL: Well-developed, well-nourished, obese female in no acute distress.  HEENT: Normocephalic HEART: normal rate RESP: normal effort ABDOMEN: Soft, non-tender EXTREMITIES: Nontender, no edema NEURO: Alert and oriented SPECULUM EXAM: NEFG, moderate amount of thick, white, curd-like discharge, no odor, no blood noted, cervix clean BIMANUAL: cervix closed; unable to assess uterine size due to body habitus, no adnexal tenderness or masses, no cervical motion tenderness.  LAB RESULTS Results for orders placed during the hospital encounter of 05/14/13 (from the past 24 hour(s))  POCT PREGNANCY, URINE     Status: None   Collection Time    05/14/13  2:05 AM      Result Value Range   Preg Test, Ur NEGATIVE  NEGATIVE    IMAGING No results found.  MAU COURSE Diflucan given.  ASSESSMENT 1. Vaginal yeast infection    PLAN Discharge home in stable condition. GC/Chlamydia cultures pending List of gynecologist given. Follow-up Information   Follow up with Gynecologist. (4 routine care and as needed if symptoms worsen)  Medication List    STOP taking these medications       HYDROcodone-acetaminophen 5-325 MG per tablet  Commonly known as:  NORCO/VICODIN      TAKE these medications       citalopram 40 MG tablet  Commonly known as:  CELEXA  Take 40 mg by mouth daily.     fluconazole 150 MG tablet  Commonly known as:  DIFLUCAN  Take 1 tablet (150 mg total) by mouth once. If no improvement in 3 days.  Start taking on:  05/17/2013     ibuprofen 800 MG tablet  Commonly known as:  ADVIL,MOTRIN  Take 1 tablet (800 mg total) by mouth 3 (three) times daily.     penicillin v potassium 500 MG tablet  Commonly known as:  VEETID  Take 500 mg by mouth 4 (four) times daily.          [place] Brodi Kari, CNM 05/14/2013  3:12 AM

## 2013-05-14 NOTE — Discharge Instructions (Signed)
Candidal Vulvovaginitis Candidal vulvovaginitis is an infection of the vagina and vulva. The vulva is the skin around the opening of the vagina. This may cause itching and discomfort in and around the vagina.  HOME CARE  Only take medicine as told by your doctor.  Do not have sex (intercourse) until the infection is healed or as told by your doctor.  Practice safe sex.  Tell your sex partner about your infection.  Do not douche or use tampons.  Wear cotton underwear. Do not wear tight pants or panty hose.  Eat yogurt. This may help treat and prevent yeast infections. GET HELP RIGHT AWAY IF:   You have a fever.  Your problems get worse during treatment or do not get better in 3 days.  You have discomfort, irritation, or itching in your vagina or vulva area.  You have pain after sex.  You start to get belly (abdominal) pain. MAKE SURE YOU:  Understand these instructions.  Will watch your condition.  Will get help right away if you are not doing well or get worse. Document Released: 07/06/2008 Document Revised: 07/02/2011 Document Reviewed: 07/06/2008 [facility] Patient Information 2014 [place], [place].   OB/GYN Providers [facility]    [facility]  [phone]     Phone: [phone]          [facility]                      [facility]  @[place]     Phone: [phone]  Phone: [phone]         [facility]     Phone: [phone]  Phone: [phone]           [facility] [place]                hone: [phone]  Phone: [phone]         [facility] [address] Ce[facility]      Phone: [phone]  Phone: [phone]         [facility]                Phone: [phone]  [facility]   [address]:[phone]    [facility] [place]          Phone: [phone] [facility]   Phone: [phone]  [facility] [address], [place], [place] [address] [phone]  [facility] [phone]

## 2013-05-14 NOTE — MAU Note (Signed)
Pt states she noticed discharge yesterday and tried to got a mild soap to wash with.Pt states her perineum became irritated

## 2013-05-14 NOTE — Progress Notes (Signed)
Wet prep sent.   

## 2013-05-15 LAB — GC/CHLAMYDIA PROBE AMP
CT PROBE, AMP APTIMA: NEGATIVE
GC PROBE AMP APTIMA: NEGATIVE

## 2013-05-19 ENCOUNTER — Ambulatory Visit: Payer: Self-pay | Admitting: Internal Medicine

## 2013-05-25 ENCOUNTER — Telehealth: Payer: Self-pay | Admitting: General Practice

## 2013-05-25 NOTE — Telephone Encounter (Signed)
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 Spoke to Dr Ike Benedom who stated patient could have one dose. Called pharmacy patient ([facility] [address]) and relayed information to dispense 1 tab.

## 2013-06-13 ENCOUNTER — Encounter (HOSPITAL_COMMUNITY): Payer: Self-pay | Admitting: Emergency Medicine

## 2013-06-13 ENCOUNTER — Emergency Department (HOSPITAL_COMMUNITY)
Admission: EM | Admit: 2013-06-13 | Discharge: 2013-06-13 | Disposition: A | Discharge status: Home / Self Care | Payer: Self-pay | Attending: Emergency Medicine | Admitting: Emergency Medicine

## 2013-06-13 DIAGNOSIS — Z791 Long term (current) use of non-steroidal anti-inflammatories (NSAID): Secondary | ICD-10-CM | POA: Insufficient documentation

## 2013-06-13 DIAGNOSIS — Z87440 Personal history of urinary (tract) infections: Secondary | ICD-10-CM | POA: Insufficient documentation

## 2013-06-13 DIAGNOSIS — K089 Disorder of teeth and supporting structures, unspecified: Secondary | ICD-10-CM | POA: Insufficient documentation

## 2013-06-13 DIAGNOSIS — F17200 Nicotine dependence, unspecified, uncomplicated: Secondary | ICD-10-CM | POA: Insufficient documentation

## 2013-06-13 DIAGNOSIS — K0889 Other specified disorders of teeth and supporting structures: Secondary | ICD-10-CM

## 2013-06-13 DIAGNOSIS — F329 Major depressive disorder, single episode, unspecified: Secondary | ICD-10-CM | POA: Insufficient documentation

## 2013-06-13 DIAGNOSIS — F3289 Other specified depressive episodes: Secondary | ICD-10-CM | POA: Insufficient documentation

## 2013-06-13 DIAGNOSIS — Z79899 Other long term (current) drug therapy: Secondary | ICD-10-CM | POA: Insufficient documentation

## 2013-06-13 MED ORDER — HYDROCODONE-ACETAMINOPHEN 5-325 MG PO TABS: 1.0000 | ORAL_TABLET | Freq: Four times a day (QID) | ORAL | Status: DC | PRN

## 2013-06-13 MED ORDER — PENICILLIN V POTASSIUM 500 MG PO TABS: 500.0000 mg | ORAL_TABLET | Freq: Four times a day (QID) | ORAL | Status: DC

## 2013-06-13 MED ORDER — HYDROCODONE-ACETAMINOPHEN 5-325 MG PO TABS
1.0000 | ORAL_TABLET | Freq: Once | ORAL | Status: AC
  Administered 2013-06-13: 1 via ORAL
  Filled 2013-06-13: qty 1

## 2013-06-13 NOTE — ED Provider Notes (Signed)
CSN: [id number]     Arrival date & time 06/13/13  1052 History   First MD Initiated Contact with Patient 06/13/13 1127     Chief Complaint  Patient presents with  . Dental Pain     (Consider location/radiation/quality/duration/timing/severity/associated sxs/prior Treatment) HPI Patient presents to the emergency department with dental pain of her back, left upper tooth.  Patient, states, that she's having pain in her jaw and cheek.  Patient, states she's had some mild swelling, as well.  Patient denies difficulty breathing, shortness of breath neck swelling, tongue swelling, fever chest pain, or vomiting.  Patient, states she did not take any medications prior to arrival. Past Medical History  Diagnosis Date  . Pregnancy induced hypertension   . Urinary tract infection   . Depression     hx of meds, none currently- doing ok  . MRSA (methicillin resistant staph aureus) culture positive     Dec 2012   Past Surgical History  Procedure Laterality Date  . Ectopic pregnancy surgery    . Cholecystectomy     Family History  Problem Relation Age of Onset  . Hypertension Mother   . Diabetes Mother   . Asthma Father   . Hypertension Father   . Stroke Maternal Grandmother   . Diabetes Paternal Grandmother   . Hypertension Paternal Grandmother    History  Substance Use Topics  . Smoking status: Current Every Day Smoker -- 0.50 packs/day for 9 years    Types: Cigarettes  . Smokeless tobacco: Never Used  . Alcohol Use: Yes     Comment: occasional   OB History   Grav Para Term Preterm Abortions TAB SAB Ect Mult Living   2 1 0 1 1   1  1      Review of Systems  All other systems negative except as documented in the HPI. All pertinent positives and negatives as reviewed in the HPI.  Allergies  Review of patient's allergies indicates no known allergies.  Home Medications   Current Outpatient Rx  Name  Route  Sig  Dispense  Refill  . citalopram (CELEXA) 40 MG tablet   Oral  Take 40 mg by mouth daily.         . fluconazole (DIFLUCAN) 150 MG tablet   Oral   Take 1 tablet (150 mg total) by mouth once. If no improvement in 3 days.   2 tablet   0   . HYDROcodone-acetaminophen (NORCO/VICODIN) 5-325 MG per tablet   Oral   Take 1 tablet by mouth every 6 (six) hours as needed for moderate pain.   15 tablet   0   . ibuprofen (ADVIL,MOTRIN) 800 MG tablet   Oral   Take 1 tablet (800 mg total) by mouth 3 (three) times daily.   21 tablet   0   . penicillin v potassium (VEETID) 500 MG tablet   Oral   Take 1 tablet (500 mg total) by mouth 4 (four) times daily.   28 tablet   0    BP 148/86  Pulse 80  Temp(Src) 98.4 F (36.9 C) (Oral)  Resp 18  SpO2 100%  LMP 04/14/2013 Physical Exam  Nursing note and vitals reviewed. Constitutional: She appears well-developed and well-nourished. No distress.  HENT:  Head: Normocephalic and atraumatic.  Mouth/Throat: Uvula is midline, oropharynx is clear and moist and mucous membranes are normal. No trismus in the jaw. No dental abscesses, uvula swelling or dental caries.    Neck: Normal range of motion. Neck  supple.  Cardiovascular: Normal rate, regular rhythm and normal heart sounds.  Exam reveals no gallop and no friction rub.   No murmur heard. Pulmonary/Chest: Effort normal and breath sounds normal.  Skin: Skin is warm and dry. No erythema.    ED Course  Procedures (including critical care time) Patient is given referral to dentistry and told to return here as needed.  I advised the patient to increase her fluid intake.  Rinse with warm water and salt.  Told to followup with the dentist provided   Carlyle Dolly, PA-C 06/14/13 1523

## 2013-06-13 NOTE — Discharge Instructions (Signed)
eturn here as needed. Follow up with the dentist provided. There is no dentist on call for the ER but she will do reduced cost dental care

## 2013-06-13 NOTE — ED Notes (Signed)
Per pt, upper back toothache now into cheek lower jaw X 2 days

## 2013-06-14 NOTE — ED Provider Notes (Signed)
Medical screening examination/treatment/procedure(s) were performed by non-physician practitioner and as supervising physician I was immediately available for consultation/collaboration.  EKG Interpretation   None       Devoria AlbeIva [name], MD, Armando GangFACEP   Ward GivensIva L Jamy Whyte, MD 06/14/13 [phone]

## 2013-06-23 ENCOUNTER — Emergency Department (HOSPITAL_COMMUNITY)
Admission: EM | Admit: 2013-06-23 | Discharge: 2013-06-23 | Disposition: A | Discharge status: Home / Self Care | Payer: Self-pay | Attending: Emergency Medicine | Admitting: Emergency Medicine

## 2013-06-23 ENCOUNTER — Emergency Department (HOSPITAL_COMMUNITY): Payer: Self-pay

## 2013-06-23 ENCOUNTER — Encounter (HOSPITAL_COMMUNITY): Payer: Self-pay | Admitting: Emergency Medicine

## 2013-06-23 DIAGNOSIS — Y929 Unspecified place or not applicable: Secondary | ICD-10-CM | POA: Insufficient documentation

## 2013-06-23 DIAGNOSIS — F3289 Other specified depressive episodes: Secondary | ICD-10-CM | POA: Insufficient documentation

## 2013-06-23 DIAGNOSIS — Z22322 Carrier or suspected carrier of Methicillin resistant Staphylococcus aureus: Secondary | ICD-10-CM | POA: Insufficient documentation

## 2013-06-23 DIAGNOSIS — M25561 Pain in right knee: Secondary | ICD-10-CM

## 2013-06-23 DIAGNOSIS — W010XXA Fall on same level from slipping, tripping and stumbling without subsequent striking against object, initial encounter: Secondary | ICD-10-CM | POA: Insufficient documentation

## 2013-06-23 DIAGNOSIS — Z79899 Other long term (current) drug therapy: Secondary | ICD-10-CM | POA: Insufficient documentation

## 2013-06-23 DIAGNOSIS — Y939 Activity, unspecified: Secondary | ICD-10-CM | POA: Insufficient documentation

## 2013-06-23 DIAGNOSIS — F17200 Nicotine dependence, unspecified, uncomplicated: Secondary | ICD-10-CM | POA: Insufficient documentation

## 2013-06-23 DIAGNOSIS — N390 Urinary tract infection, site not specified: Secondary | ICD-10-CM | POA: Insufficient documentation

## 2013-06-23 DIAGNOSIS — M25569 Pain in unspecified knee: Secondary | ICD-10-CM | POA: Insufficient documentation

## 2013-06-23 DIAGNOSIS — F329 Major depressive disorder, single episode, unspecified: Secondary | ICD-10-CM | POA: Insufficient documentation

## 2013-06-23 MED ORDER — NAPROXEN 500 MG PO TABS: 500.0000 mg | ORAL_TABLET | Freq: Two times a day (BID) | ORAL | Status: DC

## 2013-06-23 MED ORDER — KETOROLAC TROMETHAMINE 60 MG/2ML IM SOLN
60.0000 mg | Freq: Once | INTRAMUSCULAR | Status: AC
  Administered 2013-06-23: 60 mg via INTRAMUSCULAR
  Filled 2013-06-23: qty 2

## 2013-06-23 NOTE — ED Provider Notes (Signed)
CSN: [id number]     Arrival date & time 06/23/13  1932 History  This chart was scribed for non-physician practitioner Lowella Dell, PA-C working with Layla Maw Ward, DO by Danella Maiers, ED Scribe. This patient was seen in room TR08C/TR08C and the patient's care was started at 8:25 PM.    Chief Complaint  Patient presents with  . Knee Pain   The history is provided by the patient. No language interpreter was used.   HPI Comments: Ann Moran is a 28 y.o. female who presents to the Emergency Department complaining of unchanged right knee pain described as tightness since slipping and falling on wet grass one week ago. She landed on her back. She states bending and putting pressure on the knee makes the tightness worse, especially after prolonged rest. She rates the severity of the pain as a 7/10. She has tried tylenol and aspirin every 4 hours with no relief. She states she also had ankle and hip pain after the fall that has now resolved. She denies h/o knee pain. She denies numbness or tingling.   Past Medical History  Diagnosis Date  . Pregnancy induced hypertension   . Urinary tract infection   . Depression     hx of meds, none currently- doing ok  . MRSA (methicillin resistant staph aureus) culture positive     Dec 2012   Past Surgical History  Procedure Laterality Date  . Ectopic pregnancy surgery    . Cholecystectomy     Family History  Problem Relation Age of Onset  . Hypertension Mother   . Diabetes Mother   . Asthma Father   . Hypertension Father   . Stroke Maternal Grandmother   . Diabetes Paternal Grandmother   . Hypertension Paternal Grandmother    History  Substance Use Topics  . Smoking status: Current Every Day Smoker -- 0.50 packs/day for 9 years    Types: Cigarettes  . Smokeless tobacco: Never Used  . Alcohol Use: Yes     Comment: occasional   OB History   Grav Para Term Preterm Abortions TAB SAB Ect Mult Living   2 1 0 1 1   1  1      Review of  Systems  All other systems reviewed and are negative.      Allergies  Review of patient's allergies indicates no known allergies.  Home Medications   Current Outpatient Rx  Name  Route  Sig  Dispense  Refill  . citalopram (CELEXA) 40 MG tablet   Oral   Take 40 mg by mouth daily.         . fluconazole (DIFLUCAN) 150 MG tablet   Oral   Take 1 tablet (150 mg total) by mouth once. If no improvement in 3 days.   2 tablet   0   . HYDROcodone-acetaminophen (NORCO/VICODIN) 5-325 MG per tablet   Oral   Take 1 tablet by mouth every 6 (six) hours as needed for moderate pain.   15 tablet   0   . ibuprofen (ADVIL,MOTRIN) 800 MG tablet   Oral   Take 1 tablet (800 mg total) by mouth 3 (three) times daily.   21 tablet   0   . naproxen (NAPROSYN) 500 MG tablet   Oral   Take 1 tablet (500 mg total) by mouth 2 (two) times daily with a meal.   30 tablet   1   . penicillin v potassium (VEETID) 500 MG tablet   Oral  Take 1 tablet (500 mg total) by mouth 4 (four) times daily.   28 tablet   0    BP 133/84  Pulse 81  Temp(Src) 98 F (36.7 C) (Oral)  Resp 18  SpO2 98%  LMP 04/14/2013 Physical Exam  Nursing note and vitals reviewed. Constitutional: She is oriented to person, place, and time. She appears well-developed and well-nourished. No distress.  HENT:  Head: Normocephalic and atraumatic.  Mouth/Throat: Uvula is midline, oropharynx is clear and moist and mucous membranes are normal.  Eyes: Conjunctivae and EOM are normal. Pupils are equal, round, and reactive to light.  Neck: Trachea normal and phonation normal. Neck supple. Carotid bruit is not present. No tracheal deviation present.  Cardiovascular: Normal rate and regular rhythm.   Pulmonary/Chest: Effort normal and breath sounds normal. No respiratory distress.  Musculoskeletal: Normal range of motion. She exhibits no edema.       Right knee: She exhibits no LCL laxity and no MCL laxity. No tenderness found.   RIGHT Knee: No knee instability. Good lateral movement of patella without pain. No point tenderness. No joint line tenderness. Negative Lachmann Test. No notable crepitus or popping/catching.  Good passive ROM. Pain reproduced with resisted flexion and extension of the knee.   No tenderness in the hips.Good ROM in hips. Patient ambulates without assistance in room.    Neurological: She is alert and oriented to person, place, and time. She has normal strength. No cranial nerve deficit or sensory deficit.  Reflex Scores:      Patellar reflexes are 2+ on the right side and 2+ on the left side. Good leg strength and sensataion bilaterally. Good pulses.   Skin: Skin is warm and dry. She is not diaphoretic.  Psychiatric: She has a normal mood and affect. Her behavior is normal.    ED Course  Procedures (including critical care time) Medications  ketorolac (TORADOL) injection 60 mg (not administered)    DIAGNOSTIC STUDIES: Oxygen Saturation is 98% on RA, normal by my interpretation.    COORDINATION OF CARE: 9:03 PM- Discussed treatment plan with pt which includes anti-inflammatories. Pt agrees to plan.    Labs Review Labs Reviewed - No data to display Imaging Review Dg Knee Complete 4 Views Right  06/23/2013   CLINICAL DATA:  Larey SeatFell.  Persistent right knee pain.  EXAM: RIGHT KNEE - COMPLETE 4+ VIEW  COMPARISON:  None.  FINDINGS: The joint spaces are maintained. No acute fracture or osteochondral abnormality. No obvious joint effusion but exam limited due to body habitus.  IMPRESSION: No acute fracture.   Electronically Signed   By: Loralie ChampagneMark  Gallerani M.D.   On: 06/23/2013 20:48     EKG Interpretation None      MDM   Final diagnoses:  Knee pain, right    Plan films negative for acute fracture.  Knee exam benign. Pain with at supra patellar tendon with resisted knee extension. Suspect prepatellar tendonitis. Will treat with NSAIDS and plan to have patient follow up with PCP in 1 week  for reevaluation.   Meds given in ED:  Medications  ketorolac (TORADOL) injection 60 mg (not administered)    New Prescriptions   NAPROXEN (NAPROSYN) 500 MG TABLET    Take 1 tablet (500 mg total) by mouth 2 (two) times daily with a meal.   I personally performed the services described in this documentation, which was scribed in my presence. The recorded information has been reviewed and is accurate.   Rudene AndaJacob Gray Haruto Demaria, PA-C 06/24/13  1327 

## 2013-06-23 NOTE — ED Notes (Signed)
Pt states she fell about one week prior on the ice, and now is having some pain when walking, and states her knee gets tired.

## 2013-06-23 NOTE — Discharge Instructions (Signed)
Knee Pain Knee pain can be a result of an injury or other medical conditions. Treatment will depend on the cause of your pain. HOME CARE  Only take medicine as told by your doctor.  Keep a healthy weight. Being overweight can make the knee hurt more.  Stretch before exercising or playing sports.  If there is constant knee pain, change the way you exercise. Ask your doctor for advice.  Make sure shoes fit well. Choose the right shoe for the sport or activity.  Protect your knees. Wear kneepads if needed.  Rest when you are tired. GET HELP RIGHT AWAY IF:   Your knee pain does not stop.  Your knee pain does not get better.  Your knee joint feels hot to the touch.  You have a fever. MAKE SURE YOU:   Understand these instructions.  Will watch this condition.  Will get help right away if you are not doing well or get worse. Document Released: 07/06/2008 Document Revised: 07/02/2011 Document Reviewed: 07/06/2008 [employer] Patient Information 2014 [employer].  

## 2013-06-25 NOTE — ED Provider Notes (Signed)
Medical screening examination/treatment/procedure(s) were performed by non-physician practitioner and as supervising physician I was immediately available for consultation/collaboration.   EKG Interpretation None        Layla MawKristen N Rayaan Lorah, DO 06/25/13 2342

## 2013-07-08 ENCOUNTER — Encounter (HOSPITAL_COMMUNITY): Payer: Self-pay | Admitting: Emergency Medicine

## 2013-07-08 ENCOUNTER — Emergency Department (HOSPITAL_COMMUNITY)
Admission: EM | Admit: 2013-07-08 | Discharge: 2013-07-08 | Disposition: A | Discharge status: Home / Self Care | Payer: Self-pay | Attending: Emergency Medicine | Admitting: Emergency Medicine

## 2013-07-08 DIAGNOSIS — F17200 Nicotine dependence, unspecified, uncomplicated: Secondary | ICD-10-CM | POA: Insufficient documentation

## 2013-07-08 DIAGNOSIS — Z8614 Personal history of Methicillin resistant Staphylococcus aureus infection: Secondary | ICD-10-CM | POA: Insufficient documentation

## 2013-07-08 DIAGNOSIS — Z8659 Personal history of other mental and behavioral disorders: Secondary | ICD-10-CM | POA: Insufficient documentation

## 2013-07-08 DIAGNOSIS — G8911 Acute pain due to trauma: Secondary | ICD-10-CM | POA: Insufficient documentation

## 2013-07-08 DIAGNOSIS — M25561 Pain in right knee: Secondary | ICD-10-CM

## 2013-07-08 DIAGNOSIS — M25569 Pain in unspecified knee: Secondary | ICD-10-CM | POA: Insufficient documentation

## 2013-07-08 DIAGNOSIS — Z87440 Personal history of urinary (tract) infections: Secondary | ICD-10-CM | POA: Insufficient documentation

## 2013-07-08 DIAGNOSIS — Z8679 Personal history of other diseases of the circulatory system: Secondary | ICD-10-CM | POA: Insufficient documentation

## 2013-07-08 MED ORDER — NAPROXEN 500 MG PO TABS: 500.0000 mg | ORAL_TABLET | Freq: Two times a day (BID) | ORAL | Status: DC

## 2013-07-08 MED ORDER — TRAMADOL HCL 50 MG PO TABS: 50.0000 mg | ORAL_TABLET | Freq: Four times a day (QID) | ORAL | Status: DC | PRN

## 2013-07-08 NOTE — ED Notes (Signed)
Pt. reports recurring pain at right lateral knee worse when walking long distance and sitting over long periods , slipped and fell 3 weeks ago , seen here prescribed with Naproxen with no relief. Ambulatory.

## 2013-07-08 NOTE — ED Provider Notes (Signed)
CSN: [id number]     Arrival date & time 07/08/13  0035 History   First MD Initiated Contact with Patient 07/08/13 0059     Chief Complaint  Patient presents with  . Knee Pain     (Consider location/radiation/quality/duration/timing/severity/associated sxs/prior Treatment) HPI Ann Moran is a 28 y.o. female who presents to emergency department complaining of right knee pain. Patient states she slipped on ice and fell approximately 1 month ago. She states she has been seen here in emergency department for this before, had negative x-rays. States knee continues to her. Pain is worsened with walking especially up and down the stairs. Patient states she also feels like knee is stiff when she is bending it. She has been icing it at home, and taking Naprosyn for pain and inflammation. She states she has not followed up with orthopedics Dr. because she lost the paperwork with her referral. She states that Naprosyn does help her pain film however she continues to be in significant pain. Patient denies any other injuries, denies any pain in her hip or ankle. Denies any numbness or weakness in the leg.  Past Medical History  Diagnosis Date  . Pregnancy induced hypertension   . Urinary tract infection   . Depression     hx of meds, none currently- doing ok  . MRSA (methicillin resistant staph aureus) culture positive     Dec 2012   Past Surgical History  Procedure Laterality Date  . Ectopic pregnancy surgery    . Cholecystectomy     Family History  Problem Relation Age of Onset  . Hypertension Mother   . Diabetes Mother   . Asthma Father   . Hypertension Father   . Stroke Maternal Grandmother   . Diabetes Paternal Grandmother   . Hypertension Paternal Grandmother    History  Substance Use Topics  . Smoking status: Current Every Day Smoker -- 0.50 packs/day for 9 years    Types: Cigarettes  . Smokeless tobacco: Never Used  . Alcohol Use: Yes     Comment: occasional   OB History    Grav Para Term Preterm Abortions TAB SAB Ect Mult Living   2 1 0 1 1   1  1      Review of Systems  Constitutional: Negative for fever and chills.  Musculoskeletal: Positive for arthralgias and joint swelling.  Neurological: Negative for weakness and numbness.      Allergies  Review of patient's allergies indicates no known allergies.  Home Medications   Current Outpatient Rx  Name  Route  Sig  Dispense  Refill  . acetaminophen (TYLENOL) 325 MG tablet   Oral   Take 650 mg by mouth every 6 (six) hours as needed for mild pain.          BP 141/97  Pulse 93  Temp(Src) 98.3 F (36.8 C) (Oral)  Resp 14  Ht 5\' 7"  (1.702 m)  Wt 302 lb (136.986 kg)  BMI 47.29 kg/m2  SpO2 100%  LMP 06/04/2013 Physical Exam  Nursing note and vitals reviewed. Constitutional: She appears well-developed and well-nourished. No distress.  HENT:  Head: Normocephalic.  Eyes: Conjunctivae are normal.  Neck: Neck supple.  Cardiovascular: Normal rate, regular rhythm and normal heart sounds.   Pulmonary/Chest: Effort normal and breath sounds normal. No respiratory distress. She has no wheezes. She has no rales.  Musculoskeletal: She exhibits no edema.  Normal appearing right knee with no significant swelling, erythema, warmth to palpation. Tender to palpation over anterior  right knee and over lateral knee joint. Full range of motion of the knee passively and actively. Pain with full flexion and full extension of the joint. Negative anterior and posterior drawer signs. No laxity with medial or lateral stress. Normal hip and ankle. Dorsal pedal pulses intact.  Neurological: She is alert.  Skin: Skin is warm and dry.  Psychiatric: She has a normal mood and affect. Her behavior is normal.    ED Course  Procedures (including critical care time) Labs Review Labs Reviewed - No data to display Imaging Review No results found.   EKG Interpretation None      MDM   Final diagnoses:  Right knee  pain   Patient with persistent right knee pain after falling 1 month ago. There is no signs of infection. There is open skin or skin wounds. Pain is worsened with range of motion and walking. Her joint is stable based on exam. X-rays from 06/23/2013 were negative. She has not had any new injuries. Will try to add Ultram to her Naprosyn for the pain. Knee sleeve provided. Instructed to elevate, continue to ice, followup with orthopedic specialist if it's not improving.  Filed Vitals:   07/08/13 0050  BP: 141/97  Pulse: 93  Temp: 98.3 F (36.8 C)  TempSrc: Oral  Resp: 14  Height: 5\' 7"  (1.702 m)  Weight: 302 lb (136.986 kg)  SpO2: 100%      Stepan Verrette A Jeniyah Menor, PA-C 07/08/13 0150

## 2013-07-08 NOTE — ED Notes (Addendum)
C/o R lateral lower knee pain, continues since previous visit, "some temporary relief with naproxen, but it doesn't last", "not worse, just not better", has not followed up with ortho MD, "unable to walk even small distances before knee feels like it wants to buckle/give out". Pain worse with sitting, standing and walking. Denies other sx. No obvious markings bruising swelling. Difficult to discern swelling d/t body habitus. Pain no further than just above and just below knee, no radiation. CMS & ROM intact.

## 2013-07-08 NOTE — Discharge Instructions (Signed)
Take naprosyn for pain and inflammation. Ultram for severe pain. Keep knee sleeve on when walking around. You can take it off when relaxing or sleeping. Keep knee elevated. Ice several times a day. Follow up with orthopedics specialist.   Knee Pain Knee pain can be a result of an injury or other medical conditions. Treatment will depend on the cause of your pain. HOME CARE  Only take medicine as told by your doctor.  Keep a healthy weight. Being overweight can make the knee hurt more.  Stretch before exercising or playing sports.  If there is constant knee pain, change the way you exercise. Ask your doctor for advice.  Make sure shoes fit well. Choose the right shoe for the sport or activity.  Protect your knees. Wear kneepads if needed.  Rest when you are tired. GET HELP RIGHT AWAY IF:   Your knee pain does not stop.  Your knee pain does not get better.  Your knee joint feels hot to the touch.  You have a fever. MAKE SURE YOU:   Understand these instructions.  Will watch this condition.  Will get help right away if you are not doing well or get worse. Document Released: 07/06/2008 Document Revised: 07/02/2011 Document Reviewed: 07/06/2008 [facility] Patient Information 2014 [employer], [place].

## 2013-07-10 NOTE — ED Provider Notes (Signed)
Medical screening examination/treatment/procedure(s) were performed by non-physician practitioner and as supervising physician I was immediately available for consultation/collaboration.   EKG Interpretation None       Ann HornJohn M Lataria Courser, MD 07/10/13 1005

## 2013-07-16 ENCOUNTER — Encounter (HOSPITAL_COMMUNITY): Payer: Self-pay | Admitting: Emergency Medicine

## 2013-07-16 ENCOUNTER — Emergency Department (HOSPITAL_COMMUNITY)
Admission: EM | Admit: 2013-07-16 | Discharge: 2013-07-16 | Disposition: A | Discharge status: Home / Self Care | Payer: Self-pay | Attending: Emergency Medicine | Admitting: Emergency Medicine

## 2013-07-16 DIAGNOSIS — F329 Major depressive disorder, single episode, unspecified: Secondary | ICD-10-CM | POA: Insufficient documentation

## 2013-07-16 DIAGNOSIS — F17200 Nicotine dependence, unspecified, uncomplicated: Secondary | ICD-10-CM | POA: Insufficient documentation

## 2013-07-16 DIAGNOSIS — G8911 Acute pain due to trauma: Secondary | ICD-10-CM | POA: Insufficient documentation

## 2013-07-16 DIAGNOSIS — Z87440 Personal history of urinary (tract) infections: Secondary | ICD-10-CM | POA: Insufficient documentation

## 2013-07-16 DIAGNOSIS — Z8679 Personal history of other diseases of the circulatory system: Secondary | ICD-10-CM | POA: Insufficient documentation

## 2013-07-16 DIAGNOSIS — M25561 Pain in right knee: Secondary | ICD-10-CM

## 2013-07-16 DIAGNOSIS — Z8614 Personal history of Methicillin resistant Staphylococcus aureus infection: Secondary | ICD-10-CM | POA: Insufficient documentation

## 2013-07-16 DIAGNOSIS — Z791 Long term (current) use of non-steroidal anti-inflammatories (NSAID): Secondary | ICD-10-CM | POA: Insufficient documentation

## 2013-07-16 DIAGNOSIS — F3289 Other specified depressive episodes: Secondary | ICD-10-CM | POA: Insufficient documentation

## 2013-07-16 DIAGNOSIS — M25569 Pain in unspecified knee: Secondary | ICD-10-CM | POA: Insufficient documentation

## 2013-07-16 NOTE — ED Notes (Signed)
Pt states she slipped and fell 3 weeks ago and c/o R knee pain.  Received cortisone injection this morning at Dr. Eliberto IvoryBlackman's office.  Reports increased pain since injection and difficulty walking.  Pt requesting crutches.

## 2013-07-16 NOTE — ED Provider Notes (Signed)
CSN: [id number]     Arrival date & time 07/16/13  0013 History   First MD Initiated Contact with Patient 07/16/13 0044     Chief Complaint  Patient presents with  . Knee Pain     (Consider location/radiation/quality/duration/timing/severity/associated sxs/prior Treatment) HPI History provided by pt and prior chart.  Per prior chart, pt presented to ED on 06/23/13 for R knee injury sustained in fall.  Xray negative and prescribed naproxen for pain.  Returned to ER 3/18 for persistent pain, knee sleeve provided, tramadol prescribed and she was referred to ortho.  Dr. Magnus Ivan administered a cortisone shot into the knee yesterday.  Pain seems to have increased since then.  Diffuse and constant.  Some relief w/ naproxen and tramadol.  Denies fever, skin changes, edema.  No new trauma.   Past Medical History  Diagnosis Date  . Pregnancy induced hypertension   . Urinary tract infection   . Depression     hx of meds, none currently- doing ok  . MRSA (methicillin resistant staph aureus) culture positive     Dec 2012   Past Surgical History  Procedure Laterality Date  . Ectopic pregnancy surgery    . Cholecystectomy     Family History  Problem Relation Age of Onset  . Hypertension Mother   . Diabetes Mother   . Asthma Father   . Hypertension Father   . Stroke Maternal Grandmother   . Diabetes Paternal Grandmother   . Hypertension Paternal Grandmother    History  Substance Use Topics  . Smoking status: Current Every Day Smoker -- 0.50 packs/day for 9 years    Types: Cigarettes  . Smokeless tobacco: Never Used  . Alcohol Use: Yes     Comment: occasional   OB History   Grav Para Term Preterm Abortions TAB SAB Ect Mult Living   2 1 0 1 1   1  1      Review of Systems  All other systems reviewed and are negative.      Allergies  Review of patient's allergies indicates no known allergies.  Home Medications   Current Outpatient Rx  Name  Route  Sig  Dispense  Refill  .  naproxen sodium (ANAPROX) 220 MG tablet   Oral   Take 220 mg by mouth every 12 (twelve) hours.         . traMADol (ULTRAM) 50 MG tablet   Oral   Take 50 mg by mouth every 6 (six) hours as needed for moderate pain.          BP 145/79  Pulse 69  Temp(Src) 97.8 F (36.6 C) (Oral)  Resp 18  Wt 312 lb 8 oz (141.749 kg)  SpO2 97%  LMP 06/06/2013 Physical Exam  Nursing note and vitals reviewed. Constitutional: She is oriented to person, place, and time. She appears well-developed and well-nourished. No distress.  HENT:  Head: Normocephalic and atraumatic.  Eyes:  Normal appearance  Neck: Normal range of motion.  Pulmonary/Chest: Effort normal.  Musculoskeletal: Normal range of motion.  R knee w/out deformity, edema, erythema, warmth.  Diffuse tenderness both anteriorly and posteriorly.  Pain w/ flexion beyond 90deg.  No edema or tenderness of calf.  2+ DP pulse and distal sensation intact.    Neurological: She is alert and oriented to person, place, and time.  Psychiatric: She has a normal mood and affect. Her behavior is normal.    ED Course  Procedures (including critical care time) Labs Review Labs Reviewed -  No data to display Imaging Review No results found.   EKG Interpretation None      MDM   Final diagnoses:  Pain in right knee    Healthy 27yo F presents w/ persistent, traumatic R knee pain.  Ortho administered cortisone shot into knee yesterday but pain seems to have worsened since then.  No signs of septic arthritis on exam.  Ortho tech provided her w/ crutches (pain aggravated by bearing weight) as well as a knee immobilizer (sleeve gives her minimal support and slides off of her knee).  Recommended that she continue prescribed medications, ice, elevate and f/u w/ ortho as scheduled.  Return precautions discussed.     Otilio Miuatherine E Danyel Griess, PA-C 07/16/13 [phone]

## 2013-07-16 NOTE — Progress Notes (Signed)
Orthopedic Tech Progress Note Patient Details:  Ann Moran 1985-05-06 [id number]  Ortho Devices Type of Ortho Device: Knee Immobilizer;Crutches   Haskell Flirtewsome, [name] M 07/16/2013, 1:33 AM

## 2013-07-16 NOTE — Discharge Instructions (Signed)
Take ultram as prescribed for severe pain.   Do not drive within four hours of taking this medication (may cause drowsiness or confusion).  Continue ibuprofen as well.  Ice 3 times a day for 15-20 minutes.  Elevate when possible to decrease swelling and pain.  Activity as tolerated.  Follow up with ortho as scheduled.  You should return to the ER if you develop worsening pain, fever, redness/warmth to touch of right knee.

## 2013-07-19 NOTE — ED Provider Notes (Signed)
Medical screening examination/treatment/procedure(s) were performed by non-physician practitioner and as supervising physician I was immediately available for consultation/collaboration.   EKG Interpretation None        Julie Manly, MD 07/19/13 0819 

## 2013-11-11 ENCOUNTER — Encounter (HOSPITAL_COMMUNITY): Payer: Self-pay | Admitting: Emergency Medicine

## 2013-11-11 ENCOUNTER — Emergency Department (HOSPITAL_COMMUNITY)
Admission: EM | Admit: 2013-11-11 | Discharge: 2013-11-11 | Disposition: A | Discharge status: Home / Self Care | Payer: Self-pay | Attending: Emergency Medicine | Admitting: Emergency Medicine

## 2013-11-11 DIAGNOSIS — Z87440 Personal history of urinary (tract) infections: Secondary | ICD-10-CM | POA: Insufficient documentation

## 2013-11-11 DIAGNOSIS — Z8659 Personal history of other mental and behavioral disorders: Secondary | ICD-10-CM | POA: Insufficient documentation

## 2013-11-11 DIAGNOSIS — Z3202 Encounter for pregnancy test, result negative: Secondary | ICD-10-CM | POA: Insufficient documentation

## 2013-11-11 DIAGNOSIS — Z8614 Personal history of Methicillin resistant Staphylococcus aureus infection: Secondary | ICD-10-CM | POA: Insufficient documentation

## 2013-11-11 DIAGNOSIS — IMO0002 Reserved for concepts with insufficient information to code with codable children: Secondary | ICD-10-CM | POA: Insufficient documentation

## 2013-11-11 DIAGNOSIS — R609 Edema, unspecified: Secondary | ICD-10-CM | POA: Insufficient documentation

## 2013-11-11 LAB — POC URINE PREG, ED: PREG TEST UR: NEGATIVE

## 2013-11-11 NOTE — ED Provider Notes (Signed)
Medical screening examination/treatment/procedure(s) were performed by non-physician practitioner and as supervising physician I was immediately available for consultation/collaboration.    Linwood DibblesJon Johneisha Broaden, MD 11/11/13 1058

## 2013-11-11 NOTE — ED Notes (Signed)
Pt present with swelling to bilateral feet x 3 days, pt states feet feel tight but around ankle are throbbing.

## 2013-11-11 NOTE — ED Provider Notes (Signed)
CSN: [id number]     Arrival date & time 11/11/13  0900 History   First MD Initiated Contact with Patient 11/11/13 0913     Chief Complaint  Patient presents with  . Foot Swelling    bilateral     (Consider location/radiation/quality/duration/timing/severity/associated sxs/prior Treatment) HPI Comments: 28 year old female with past medical history pregnancy-induced hypertension and depression presents to the emergency department complaining of swelling around both of her ankles over the past 3 days. Patient states she occasionally has swelling to her lower extremities, however it normally subsides, over the past 3 days it has not subsided. States the swelling is normally worse at night, however over the past 2 days it has remained constant. She has pain around both ankles. Denies calf pain or swelling. No oral contraceptive use. Denies recent travel or immobilization. She had pregnancy-induced hypertension, her pregnancy was about 6 years ago. Last menstrual period was July 11 and normal. Denies chest pain, shortness of breath, nausea or vomiting.  The history is provided by the patient.    Past Medical History  Diagnosis Date  . Pregnancy induced hypertension   . Urinary tract infection   . Depression     hx of meds, none currently- doing ok  . MRSA (methicillin resistant staph aureus) culture positive     Dec 2012   Past Surgical History  Procedure Laterality Date  . Ectopic pregnancy surgery    . Cholecystectomy     Family History  Problem Relation Age of Onset  . Hypertension Mother   . Diabetes Mother   . Asthma Father   . Hypertension Father   . Stroke Maternal Grandmother   . Diabetes Paternal Grandmother   . Hypertension Paternal Grandmother    History  Substance Use Topics  . Smoking status: Current Every Day Smoker -- 0.50 packs/day for 9 years    Types: Cigarettes  . Smokeless tobacco: Never Used  . Alcohol Use: Yes     Comment: occasional   OB History    Grav Para Term Preterm Abortions TAB SAB Ect Mult Living   2 1 0 1 1   1  1      Review of Systems  Cardiovascular: Positive for leg swelling.  All other systems reviewed and are negative.     Allergies  Review of patient's allergies indicates no known allergies.  Home Medications   Prior to Admission medications   Medication Sig Start Date End Date Taking? Authorizing Provider  diphenhydrAMINE (BENADRYL) 25 mg capsule Take 75 mg by mouth every 6 (six) hours as needed for allergies.   Yes Historical Provider, MD  hydrocortisone 2.5 % cream Apply 1 application topically 2 (two) times daily.   Yes Historical Provider, MD   BP 126/76  Pulse 74  Temp(Src) 97.7 F (36.5 C) (Oral)  Resp 20  SpO2 95%  LMP 10/31/2013 Physical Exam  Nursing note and vitals reviewed. Constitutional: She is oriented to person, place, and time. She appears well-developed and well-nourished. No distress.  HENT:  Head: Normocephalic and atraumatic.  Mouth/Throat: Oropharynx is clear and moist.  Eyes: Conjunctivae are normal.  Neck: Normal range of motion. Neck supple.  Cardiovascular: Normal rate, regular rhythm, normal heart sounds and intact distal pulses.   Nonpitting edema bilateral lower extremities around ankles and feet. Bilateral lower legs equal in size. No erythema or warmth.  Pulmonary/Chest: Effort normal and breath sounds normal.  Abdominal: Soft. Bowel sounds are normal. There is no tenderness.  Musculoskeletal: Normal range of motion.  She exhibits no edema.  tenderness or swelling.  Neurological: She is alert and oriented to person, place, and time.  Skin: Skin is warm, dry and intact. She is not diaphoretic.  Psychiatric: She has a normal mood and affect. Her behavior is normal.    ED Course  Procedures (including critical care time) Labs Review Labs Reviewed  POC URINE PREG, ED    Imaging Review No results found.   EKG Interpretation None      MDM   Final diagnoses:   Peripheral edema   Pt presenting with bilateral lower extremity edema in ankles and feet. No calf pain or swelling. Afebrile, VSS. Doubt DVT as this is bilateral, been present for a while now. Urine preg negative. Hx of pregnancy induced HTN. Gave pt TED hose, advised her to elevate her legs. Resources for PCP f/u given. Return precautions given. Patient states understanding of treatment care plan and is agreeable.   Trevor Mace, PA-C 11/11/13 1051

## 2013-11-11 NOTE — Discharge Instructions (Signed)
Keep legs elevated whenever at home or sitting in a chair. Wear the TED hose throughout the day. Followup with the wellness clinic or one of the resources below to establish care with a primary care physician.  Edema Edema is an abnormal buildup of fluids in your bodytissues. Edema is somewhatdependent on gravity to pull the fluid to the lowest place in your body. That makes the condition more common in the legs and thighs (lower extremities). Painless swelling of the feet and ankles is common and becomes more likely as you get older. It is also common in looser tissues, like around your eyes.  When the affected area is squeezed, the fluid may move out of that spot and leave a dent for a few moments. This dent is called pitting.  CAUSES  There are many possible causes of edema. Eating too much salt and being on your feet or sitting for a long time can cause edema in your legs and ankles. Hot weather may make edema worse. Common medical causes of edema include:  Heart failure.  Liver disease.  Kidney disease.  Weak blood vessels in your legs.  Cancer.  An injury.  Pregnancy.  Some medications.  Obesity. SYMPTOMS  Edema is usually painless.Your skin may look swollen or shiny.  DIAGNOSIS  Your health care provider may be able to diagnose edema by asking about your medical history and doing a physical exam. You may need to have tests such as X-rays, an electrocardiogram, or blood tests to check for medical conditions that may cause edema.  TREATMENT  Edema treatment depends on the cause. If you have heart, liver, or kidney disease, you need the treatment appropriate for these conditions. General treatment may include:  Elevation of the affected body part above the level of your heart.  Compression of the affected body part. Pressure from elastic bandages or support stockings squeezes the tissues and forces fluid back into the blood vessels. This keeps fluid from entering the  tissues.  Restriction of fluid and salt intake.  Use of a water pill (diuretic). These medications are appropriate only for some types of edema. They pull fluid out of your body and make you urinate more often. This gets rid of fluid and reduces swelling, but diuretics can have side effects. Only use diuretics as directed by your health care provider. HOME CARE INSTRUCTIONS   Keep the affected body part above the level of your heart when you are lying down.   Do not sit still or stand for prolonged periods.   Do not put anything directly under your knees when lying down.  Do not wear constricting clothing or garters on your upper legs.   Exercise your legs to work the fluid back into your blood vessels. This may help the swelling go down.   Wear elastic bandages or support stockings to reduce ankle swelling as directed by your health care provider.   Eat a low-salt diet to reduce fluid if your health care provider recommends it.   Only take medicines as directed by your health care provider. SEEK MEDICAL CARE IF:   Your edema is not responding to treatment.  You have heart, liver, or kidney disease and notice symptoms of edema.  You have edema in your legs that does not improve after elevating them.   You have sudden and unexplained weight gain. SEEK IMMEDIATE MEDICAL CARE IF:   You develop shortness of breath or chest pain.   You cannot breathe when you lie down.  You develop pain, redness, or warmth in the swollen areas.   You have heart, liver, or kidney disease and suddenly get edema.  You have a fever and your symptoms suddenly get worse. MAKE SURE YOU:   Understand these instructions.  Will watch your condition.  Will get help right away if you are not doing well or get worse. Document Released: 04/09/2005 Document Revised: 04/14/2013 Document Reviewed: 01/30/2013 [facility] Patient Information 2015 [place]. This information is not intended to  replace advice given to you by your health care provider. Make sure you discuss any questions you have with your health care provider. RESOURCE GUIDE  Chronic Pain Problems: Contact [facility]  [phone] Patients need to be referred by their primary care doctor.  Insufficient Money for Medicine: Contact [employer]:  call "211."   No Primary Care Doctor: - Call [facility] Connect  [phone] - can help you locate a primary care doctor that  accepts your insurance, provides certain services, etc. - Physician Referral Service- [phone]  Agencies that provide inexpensive medical care: - [facility]  [phone] - [facility]  [phone] - [facility]  [phone] - [facility]  [phone] - [facility]  [phone] - [facility]  [phone]  Medicaid-accepting [facility] Providers: - [facility]- [address], [address]  [phone], Mon-Fri 9am-7pm, Sat 9am-1pm - [facility]- [address], [address] [place]  [phone] - [facility]- [address], [address] [place]  [phone] [facility]- [address] [place] [address]  [phone] - Renaye RakersVeita Bland- [address], [address], [phone]  Only accepts [place] patients after they have their name  applied to their card  Self Pay (no insurance) in [place]: - Sickle Cell Patients: Dr Willey BladeEric Dean, [facility]  [address], [phone] - [facility]- [address]  [phone]       Redge Gainer-     [facility]- [address], [address]       -     [facility]- see information above (Speak to [employer]am H if you do not have insurance)       -  [facility] [place]- [address],  [phone]       -  [facility]- [address] [place] [address], [phone]       -  Dr Julio Sickssei-Bonsu-  [address], [address], [place],  [phone]       -  [facility] - [address], [phone]       -  [facility]- [address], [phone], also [address], [phone]       -    [facility]- [address], [phone], 1st & 3rd Saturday        every month, 10am-1pm  1) Find a Doctor and Pay Out of Pocket Although you won't have to find out who is covered by your insurance plan, it is a good idea to ask around and get recommendations. You will then need to call the office and see if the doctor you have chosen will accept you as a new patient and what types of options they offer for patients who are self-pay. Some doctors offer discounts or will set up payment plans for their  patients who do not have insurance, but you will need to ask so you aren't surprised when you get to your appointment.  2) Contact Your Local Health Department Not all health departments have doctors that can see patients for sick visits, but many do, so it is worth a call to see if yours does. If you don't know where your local health department is, you can check in your phone book. The CDC also has a tool to help you locate your state's health department, and many state websites also have listings of all of their local health departments.  3) Find a Walk-in Clinic If your illness is not likely to be very severe or complicated, you may want to try a walk in clinic. These are popping up all over the country in pharmacies, drugstores, and shopping centers. They're usually staffed by nurse practitioners or physician assistants that have been trained to treat common illnesses and complaints. They're usually fairly quick and inexpensive. However, if you have serious medical issues or chronic medical problems, these are probably not your best option

## 2013-11-11 NOTE — ED Notes (Addendum)
Pt reports bilateral feet swelling for 3 days. Upon assessment pt has pitting edema but moderate pedal pulses bilaterally. Pt denies injury.

## 2013-12-06 ENCOUNTER — Encounter (HOSPITAL_COMMUNITY): Payer: Self-pay | Admitting: Emergency Medicine

## 2013-12-06 ENCOUNTER — Emergency Department (HOSPITAL_COMMUNITY)
Admission: EM | Admit: 2013-12-06 | Discharge: 2013-12-06 | Disposition: A | Discharge status: Home / Self Care | Payer: Self-pay | Attending: Emergency Medicine | Admitting: Emergency Medicine

## 2013-12-06 DIAGNOSIS — F17200 Nicotine dependence, unspecified, uncomplicated: Secondary | ICD-10-CM | POA: Insufficient documentation

## 2013-12-06 DIAGNOSIS — Z8659 Personal history of other mental and behavioral disorders: Secondary | ICD-10-CM | POA: Insufficient documentation

## 2013-12-06 DIAGNOSIS — Z3202 Encounter for pregnancy test, result negative: Secondary | ICD-10-CM | POA: Insufficient documentation

## 2013-12-06 DIAGNOSIS — R109 Unspecified abdominal pain: Secondary | ICD-10-CM | POA: Insufficient documentation

## 2013-12-06 DIAGNOSIS — Z87440 Personal history of urinary (tract) infections: Secondary | ICD-10-CM | POA: Insufficient documentation

## 2013-12-06 DIAGNOSIS — IMO0002 Reserved for concepts with insufficient information to code with codable children: Secondary | ICD-10-CM | POA: Insufficient documentation

## 2013-12-06 DIAGNOSIS — Z8614 Personal history of Methicillin resistant Staphylococcus aureus infection: Secondary | ICD-10-CM | POA: Insufficient documentation

## 2013-12-06 DIAGNOSIS — N3000 Acute cystitis without hematuria: Secondary | ICD-10-CM | POA: Insufficient documentation

## 2013-12-06 LAB — CBC WITH DIFFERENTIAL/PLATELET
Basophils Absolute: 0 10*3/uL (ref 0.0–0.1)
Basophils Relative: 0 % (ref 0–1)
EOS PCT: 2 % (ref 0–5)
Eosinophils Absolute: 0.1 10*3/uL (ref 0.0–0.7)
HEMATOCRIT: 41.8 % (ref 36.0–46.0)
HEMOGLOBIN: 13.9 g/dL (ref 12.0–15.0)
LYMPHS PCT: 23 % (ref 12–46)
Lymphs Abs: 1.7 10*3/uL (ref 0.7–4.0)
MCH: 30.2 pg (ref 26.0–34.0)
MCHC: 33.3 g/dL (ref 30.0–36.0)
MCV: 90.7 fL (ref 78.0–100.0)
MONO ABS: 0.5 10*3/uL (ref 0.1–1.0)
MONOS PCT: 6 % (ref 3–12)
NEUTROS ABS: 5 10*3/uL (ref 1.7–7.7)
Neutrophils Relative %: 69 % (ref 43–77)
Platelets: 236 10*3/uL (ref 150–400)
RBC: 4.61 MIL/uL (ref 3.87–5.11)
RDW: 12.9 % (ref 11.5–15.5)
WBC: 7.3 10*3/uL (ref 4.0–10.5)

## 2013-12-06 LAB — URINALYSIS, ROUTINE W REFLEX MICROSCOPIC
BILIRUBIN URINE: NEGATIVE
Glucose, UA: NEGATIVE mg/dL
KETONES UR: NEGATIVE mg/dL
NITRITE: NEGATIVE
PH: 6.5 (ref 5.0–8.0)
PROTEIN: NEGATIVE mg/dL
Specific Gravity, Urine: 1.016 (ref 1.005–1.030)
UROBILINOGEN UA: 0.2 mg/dL (ref 0.0–1.0)

## 2013-12-06 LAB — COMPREHENSIVE METABOLIC PANEL
ALT: 17 U/L (ref 0–35)
ANION GAP: 13 (ref 5–15)
AST: 16 U/L (ref 0–37)
Albumin: 4.1 g/dL (ref 3.5–5.2)
Alkaline Phosphatase: 73 U/L (ref 39–117)
BUN: 6 mg/dL (ref 6–23)
CALCIUM: 9.4 mg/dL (ref 8.4–10.5)
CO2: 23 meq/L (ref 19–32)
CREATININE: 0.84 mg/dL (ref 0.50–1.10)
Chloride: 101 mEq/L (ref 96–112)
Glucose, Bld: 101 mg/dL — ABNORMAL HIGH (ref 70–99)
Potassium: 4.3 mEq/L (ref 3.7–5.3)
Sodium: 137 mEq/L (ref 137–147)
Total Bilirubin: 0.4 mg/dL (ref 0.3–1.2)
Total Protein: 8 g/dL (ref 6.0–8.3)

## 2013-12-06 LAB — POC URINE PREG, ED: PREG TEST UR: NEGATIVE

## 2013-12-06 LAB — URINE MICROSCOPIC-ADD ON

## 2013-12-06 MED ORDER — IBUPROFEN 800 MG PO TABS: 800.0000 mg | ORAL_TABLET | Freq: Three times a day (TID) | ORAL | Status: DC | PRN

## 2013-12-06 MED ORDER — CEPHALEXIN 500 MG PO CAPS: 500.0000 mg | ORAL_CAPSULE | Freq: Two times a day (BID) | ORAL | Status: DC

## 2013-12-06 MED ORDER — ONDANSETRON 4 MG PO TBDP
4.0000 mg | ORAL_TABLET | Freq: Once | ORAL | Status: AC
  Administered 2013-12-06: 4 mg via ORAL
  Filled 2013-12-06: qty 1

## 2013-12-06 MED ORDER — PROMETHAZINE HCL 25 MG PO TABS: 25.0000 mg | ORAL_TABLET | Freq: Four times a day (QID) | ORAL | Status: DC | PRN

## 2013-12-06 MED ORDER — IBUPROFEN 800 MG PO TABS
800.0000 mg | ORAL_TABLET | Freq: Once | ORAL | Status: AC
  Administered 2013-12-06: 800 mg via ORAL
  Filled 2013-12-06: qty 1

## 2013-12-06 MED ORDER — PHENAZOPYRIDINE HCL 95 MG PO TABS: 95.0000 mg | ORAL_TABLET | Freq: Three times a day (TID) | ORAL | Status: DC | PRN

## 2013-12-06 NOTE — Discharge Instructions (Signed)

## 2013-12-06 NOTE — ED Notes (Signed)
Pt reports lower mid-abdominal/pelvic pain that started this morning. Pt also reports having lower back pain that also started this morning. Pt reports nausea and emesis, however denies diarrhea, vaginal bleeding, and vaginal discharge. Pt reports having her period two months ago, which is normal for herself.

## 2013-12-06 NOTE — ED Provider Notes (Signed)
TIME SEEN: 1:09 PM  CHIEF COMPLAINT: Lower abdominal pain, dysuria, nausea and one episode of vomiting  HPI: Patient is a 28 year old female with history depression, prior UTIs who is a G2 P1 A1 who presents emergency room with suprapubic abdominal pain, lower back pain, chills, one episode of vomiting and dysuria that started this morning. States this feels similar to her prior UTIs. No fevers. No diarrhea. No hematuria. No flank pain. No vaginal bleeding or discharge. Her last menstrual period was 2 months ago but states that her period is normally regular. No prior history of STDs. She is sexually active with one partner.  ROS: See HPI Constitutional: no fever  Eyes: no drainage  ENT: no runny nose   Cardiovascular:  no chest pain  Resp: no SOB  GI: no vomiting GU: no dysuria Integumentary: no rash  Allergy: no hives  Musculoskeletal: no leg swelling  Neurological: no slurred speech ROS otherwise negative  PAST MEDICAL HISTORY/PAST SURGICAL HISTORY:  Past Medical History  Diagnosis Date  . Pregnancy induced hypertension   . Urinary tract infection   . Depression     hx of meds, none currently- doing ok  . MRSA (methicillin resistant staph aureus) culture positive     Dec 2012    MEDICATIONS:  Prior to Admission medications   Medication Sig Start Date End Date Taking? Authorizing Provider  diphenhydrAMINE (BENADRYL) 25 mg capsule Take 75 mg by mouth every 6 (six) hours as needed for allergies.   Yes Historical Provider, MD  diphenhydramine-acetaminophen (TYLENOL PM) 25-500 MG TABS Take 3 tablets by mouth at bedtime as needed (for sleep).   Yes Historical Provider, MD  hydrocortisone 2.5 % cream Apply 1 application topically 2 (two) times daily.   Yes Historical Provider, MD    ALLERGIES:  No Known Allergies  SOCIAL HISTORY:  History  Substance Use Topics  . Smoking status: Current Every Day Smoker -- 0.50 packs/day for 9 years    Types: Cigarettes  . Smokeless  tobacco: Never Used  . Alcohol Use: Yes     Comment: occasional    FAMILY HISTORY: Family History  Problem Relation Age of Onset  . Hypertension Mother   . Diabetes Mother   . Asthma Father   . Hypertension Father   . Stroke Maternal Grandmother   . Diabetes Paternal Grandmother   . Hypertension Paternal Grandmother     EXAM: BP 131/74  Pulse 77  Temp(Src) 97.5 F (36.4 C) (Oral)  Resp 20  SpO2 98%  LMP 10/07/2013 CONSTITUTIONAL: Alert and oriented and responds appropriately to questions. Well-appearing; well-nourished HEAD: Normocephalic EYES: Conjunctivae clear, PERRL ENT: normal nose; no rhinorrhea; moist mucous membranes; pharynx without lesions noted NECK: Supple, no meningismus, no LAD  CARD: RRR; S1 and S2 appreciated; no murmurs, no clicks, no rubs, no gallops RESP: Normal chest excursion without splinting or tachypnea; breath sounds clear and equal bilaterally; no wheezes, no rhonchi, no rales,  ABD/GI: Normal bowel sounds; non-distended; soft, non-tender, no rebound, no guarding BACK:  The back appears normal and is non-tender to palpation, there is no CVA tenderness EXT: Normal ROM in all joints; non-tender to palpation; no edema; normal capillary refill; no cyanosis    SKIN: Normal color for age and race; warm NEURO: Moves all extremities equally PSYCH: The patient's mood and manner are appropriate. Grooming and personal hygiene are appropriate.  MEDICAL DECISION MAKING: Patient here with mild urinary tract infection. Her abdominal exam is benign. She is hemodynamically stable, well-appearing,  nontoxic. No flank pain on exam. Will discharge with prescription for antibiotics, ibuprofen, Zofran. Culture pending. Discussed return precautions and supportive care instructions. She verbalized understanding and is comfortable with plan.       Layla MawKristen N Cherith Tewell, DO 12/06/13 1605

## 2013-12-16 ENCOUNTER — Encounter (HOSPITAL_COMMUNITY): Payer: Self-pay | Admitting: Emergency Medicine

## 2013-12-16 ENCOUNTER — Emergency Department (HOSPITAL_COMMUNITY)
Admission: EM | Admit: 2013-12-16 | Discharge: 2013-12-16 | Disposition: A | Discharge status: Home / Self Care | Payer: Self-pay | Attending: Emergency Medicine | Admitting: Emergency Medicine

## 2013-12-16 DIAGNOSIS — IMO0002 Reserved for concepts with insufficient information to code with codable children: Secondary | ICD-10-CM | POA: Insufficient documentation

## 2013-12-16 DIAGNOSIS — R300 Dysuria: Secondary | ICD-10-CM | POA: Insufficient documentation

## 2013-12-16 DIAGNOSIS — R609 Edema, unspecified: Secondary | ICD-10-CM | POA: Insufficient documentation

## 2013-12-16 DIAGNOSIS — Z87440 Personal history of urinary (tract) infections: Secondary | ICD-10-CM | POA: Insufficient documentation

## 2013-12-16 DIAGNOSIS — Z8659 Personal history of other mental and behavioral disorders: Secondary | ICD-10-CM | POA: Insufficient documentation

## 2013-12-16 DIAGNOSIS — F17200 Nicotine dependence, unspecified, uncomplicated: Secondary | ICD-10-CM | POA: Insufficient documentation

## 2013-12-16 DIAGNOSIS — Z8679 Personal history of other diseases of the circulatory system: Secondary | ICD-10-CM | POA: Insufficient documentation

## 2013-12-16 DIAGNOSIS — N760 Acute vaginitis: Secondary | ICD-10-CM | POA: Insufficient documentation

## 2013-12-16 DIAGNOSIS — Z8614 Personal history of Methicillin resistant Staphylococcus aureus infection: Secondary | ICD-10-CM | POA: Insufficient documentation

## 2013-12-16 DIAGNOSIS — N390 Urinary tract infection, site not specified: Secondary | ICD-10-CM | POA: Insufficient documentation

## 2013-12-16 LAB — POC URINE PREG, ED: PREG TEST UR: NEGATIVE

## 2013-12-16 LAB — URINALYSIS, ROUTINE W REFLEX MICROSCOPIC
Bilirubin Urine: NEGATIVE
Glucose, UA: NEGATIVE mg/dL
Hgb urine dipstick: NEGATIVE
Ketones, ur: NEGATIVE mg/dL
NITRITE: NEGATIVE
PH: 6 (ref 5.0–8.0)
Protein, ur: NEGATIVE mg/dL
Specific Gravity, Urine: 1.024 (ref 1.005–1.030)
UROBILINOGEN UA: 1 mg/dL (ref 0.0–1.0)

## 2013-12-16 LAB — WET PREP, GENITAL
Clue Cells Wet Prep HPF POC: NONE SEEN
Trich, Wet Prep: NONE SEEN
Yeast Wet Prep HPF POC: NONE SEEN

## 2013-12-16 LAB — URINE MICROSCOPIC-ADD ON

## 2013-12-16 MED ORDER — FLUCONAZOLE 150 MG PO TABS: 150.0000 mg | ORAL_TABLET | Freq: Once | ORAL | Status: DC

## 2013-12-16 MED ORDER — NITROFURANTOIN MONOHYD MACRO 100 MG PO CAPS: 100.0000 mg | ORAL_CAPSULE | Freq: Two times a day (BID) | ORAL | Status: DC

## 2013-12-16 NOTE — Discharge Instructions (Signed)
Peripheral Edema You have swelling in your legs (peripheral edema). This swelling is due to excess accumulation of salt and water in your body. Edema may be a sign of heart, kidney or liver disease, or a side effect of a medication. It may also be due to problems in the leg veins. Elevating your legs and using special support stockings may be very helpful, if the cause of the swelling is due to poor venous circulation. Avoid long periods of standing, whatever the cause. Treatment of edema depends on identifying the cause. Chips, pretzels, pickles and other salty foods should be avoided. Restricting salt in your diet is almost always needed. Water pills (diuretics) are often used to remove the excess salt and water from your body via urine. These medicines prevent the kidney from reabsorbing sodium. This increases urine flow. Diuretic treatment may also result in lowering of potassium levels in your body. Potassium supplements may be needed if you have to use diuretics daily. Daily weights can help you keep track of your progress in clearing your edema. You should call your caregiver for follow up care as recommended. SEEK IMMEDIATE MEDICAL CARE IF:   You have increased swelling, pain, redness, or heat in your legs.  You develop shortness of breath, especially when lying down.  You develop chest or abdominal pain, weakness, or fainting.  You have a fever. Document Released: 05/17/2004 Document Revised: 07/02/2011 Document Reviewed: 04/27/2009 [facility] Patient Information 2015 [place], [place]. This information is not intended to replace advice given to you by your health care provider. Make sure you discuss any questions you have with your health care provider.  Urinary Tract Infection Urinary tract infections (UTIs) can develop anywhere along your urinary tract. Your urinary tract is your body's drainage system for removing wastes and extra water. Your urinary tract includes two kidneys, two ureters,  a bladder, and a urethra. Your kidneys are a pair of bean-shaped organs. Each kidney is about the size of your fist. They are located below your ribs, one on each side of your spine. CAUSES Infections are caused by microbes, which are microscopic organisms, including fungi, viruses, and bacteria. These organisms are so small that they can only be seen through a microscope. Bacteria are the microbes that most commonly cause UTIs. SYMPTOMS  Symptoms of UTIs may vary by age and gender of the patient and by the location of the infection. Symptoms in young women typically include a frequent and intense urge to urinate and a painful, burning feeling in the bladder or urethra during urination. Older women and men are more likely to be tired, shaky, and weak and have muscle aches and abdominal pain. A fever may mean the infection is in your kidneys. Other symptoms of a kidney infection include pain in your back or sides below the ribs, nausea, and vomiting. DIAGNOSIS To diagnose a UTI, your caregiver will ask you about your symptoms. Your caregiver also will ask to provide a urine sample. The urine sample will be tested for bacteria and white blood cells. White blood cells are made by your body to help fight infection. TREATMENT  Typically, UTIs can be treated with medication. Because most UTIs are caused by a bacterial infection, they usually can be treated with the use of antibiotics. The choice of antibiotic and length of treatment depend on your symptoms and the type of bacteria causing your infection. HOME CARE INSTRUCTIONS  If you were prescribed antibiotics, take them exactly as your caregiver instructs you. Finish the medication  even if you feel better after you have only taken some of the medication.  Drink enough water and fluids to keep your urine clear or pale yellow.  Avoid caffeine, tea, and carbonated beverages. They tend to irritate your bladder.  Empty your bladder often. Avoid holding  urine for long periods of time.  Empty your bladder before and after sexual intercourse.  After a bowel movement, women should cleanse from front to back. Use each tissue only once. SEEK MEDICAL CARE IF:   You have back pain.  You develop a fever.  Your symptoms do not begin to resolve within 3 days. SEEK IMMEDIATE MEDICAL CARE IF:   You have severe back pain or lower abdominal pain.  You develop chills.  You have nausea or vomiting.  You have continued burning or discomfort with urination. MAKE SURE YOU:   Understand these instructions.  Will watch your condition.  Will get help right away if you are not doing well or get worse. Document Released: 01/17/2005 Document Revised: 10/09/2011 Document Reviewed: 05/18/2011 [facility] Patient Information 2015 [place], [place]. This information is not intended to replace advice given to you by your health care provider. Make sure you discuss any questions you have with your health care provider.  Vaginitis Vaginitis is an inflammation of the vagina. It is most often caused by a change in the normal balance of the bacteria and yeast that live in the vagina. This change in balance causes an overgrowth of certain bacteria or yeast, which causes the inflammation. There are different types of vaginitis, but the most common types are:  Bacterial vaginosis.  Yeast infection (candidiasis).  Trichomoniasis vaginitis. This is a sexually transmitted infection (STI).  Viral vaginitis.  Atropic vaginitis.  Allergic vaginitis. CAUSES  The cause depends on the type of vaginitis. Vaginitis can be caused by:  Bacteria (bacterial vaginosis).  Yeast (yeast infection).  A parasite (trichomoniasis vaginitis)  A virus (viral vaginitis).  Low hormone levels (atrophic vaginitis). Low hormone levels can occur during pregnancy, breastfeeding, or after menopause.  Irritants, such as bubble baths, scented tampons, and feminine sprays (allergic  vaginitis). Other factors can change the normal balance of the yeast and bacteria that live in the vagina. These include:  Antibiotic medicines.  Poor hygiene.  Diaphragms, vaginal sponges, spermicides, birth control pills, and intrauterine devices (IUD).  Sexual intercourse.  Infection.  Uncontrolled diabetes.  A weakened immune system. SYMPTOMS  Symptoms can vary depending on the cause of the vaginitis. Common symptoms include:  Abnormal vaginal discharge.  The discharge is white, gray, or yellow with bacterial vaginosis.  The discharge is thick, white, and cheesy with a yeast infection.  The discharge is frothy and yellow or greenish with trichomoniasis.  A bad vaginal odor.  The odor is fishy with bacterial vaginosis.  Vaginal itching, pain, or swelling.  Painful intercourse.  Pain or burning when urinating. Sometimes, there are no symptoms. TREATMENT  Treatment will vary depending on the type of infection.   Bacterial vaginosis and trichomoniasis are often treated with antibiotic creams or pills.  Yeast infections are often treated with antifungal medicines, such as vaginal creams or suppositories.  Viral vaginitis has no cure, but symptoms can be treated with medicines that relieve discomfort. Your sexual partner should be treated as well.  Atrophic vaginitis may be treated with an estrogen cream, pill, suppository, or vaginal ring. If vaginal dryness occurs, lubricants and moisturizing creams may help. You may be told to avoid scented soaps, sprays, or douches.  Allergic vaginitis treatment involves quitting the use of the product that is causing the problem. Vaginal creams can be used to treat the symptoms. HOME CARE INSTRUCTIONS   Take all medicines as directed by your caregiver.  Keep your genital area clean and dry. Avoid soap and only rinse the area with water.  Avoid douching. It can remove the healthy bacteria in the vagina.  Do not use tampons  or have sexual intercourse until your vaginitis has been treated. Use sanitary pads while you have vaginitis.  Wipe from front to back. This avoids the spread of bacteria from the rectum to the vagina.  Let air reach your genital area.  Wear cotton underwear to decrease moisture buildup.  Avoid wearing underwear while you sleep until your vaginitis is gone.  Avoid tight pants and underwear or nylons without a cotton panel.  Take off wet clothing (especially bathing suits) as soon as possible.  Use mild, non-scented products. Avoid using irritants, such as:  Scented feminine sprays.  Fabric softeners.  Scented detergents.  Scented tampons.  Scented soaps or bubble baths.  Practice safe sex and use condoms. Condoms may prevent the spread of trichomoniasis and viral vaginitis. SEEK MEDICAL CARE IF:   You have abdominal pain.  You have a fever or persistent symptoms for more than 2-3 days.  You have a fever and your symptoms suddenly get worse. Document Released: 02/04/2007 Document Revised: 01/02/2012 Document Reviewed: 09/20/2011 [facility] Patient Information 2015 [place], [place]. This information is not intended to replace advice given to you by your health care provider. Make sure you discuss any questions you have with your health care provider.

## 2013-12-16 NOTE — ED Notes (Addendum)
Pt c/o BLE swelling x 3 days and dysuria, vaginal discharge, and vaginal odor x 2 days.  Pain score 5/10.  Pt was seen previously for BLE swelling, given TED hose and directed to follow up w/ PCP.  Pt has not followed up.  Sts she recently completed a round of antibiotics for an UTI, but symptoms have not resolved and she began having discharge and odor.  Sts discharge is thick and white.  LMP 09/26/2013.

## 2013-12-16 NOTE — ED Provider Notes (Signed)
TIME SEEN: 3:30 PM  CHIEF COMPLAINT: Dysuria, vaginal discharge, lower extremity swelling  HPI: Patient is a 28 year old G2 P1 A1 who presents the emergency department 3 days of dysuria, white thick vaginal discharge. She is also had intermittent lower extremity swelling for which she was seen here on 11/11/13. She was seen on 8/16 for dysuria and was treated with antibiotics for UTI which she reports resolved her symptoms but they came back. She is sexually active with one partner. No prior history of STDs. No fevers, chills, nausea, vomiting or diarrhea. No chest pain or shortness of breath.  ROS: See HPI Constitutional: no fever  Eyes: no drainage  ENT: no runny nose   Cardiovascular:  no chest pain  Resp: no SOB  GI: no vomiting GU: no dysuria Integumentary: no rash  Allergy: no hives  Musculoskeletal: no leg swelling  Neurological: no slurred speech ROS otherwise negative  PAST MEDICAL HISTORY/PAST SURGICAL HISTORY:  Past Medical History  Diagnosis Date  . Pregnancy induced hypertension   . Urinary tract infection   . Depression     hx of meds, none currently- doing ok  . MRSA (methicillin resistant staph aureus) culture positive     Dec 2012    MEDICATIONS:  Prior to Admission medications   Medication Sig Start Date End Date Taking? Authorizing Provider  diphenhydrAMINE (BENADRYL) 25 mg capsule Take 75 mg by mouth every 6 (six) hours as needed for allergies.   Yes Historical Provider, MD  diphenhydramine-acetaminophen (TYLENOL PM) 25-500 MG TABS Take 3 tablets by mouth at bedtime as needed (for sleep).   Yes Historical Provider, MD  hydrocortisone 2.5 % cream Apply 1 application topically 2 (two) times daily.   Yes Historical Provider, MD  ibuprofen (ADVIL,MOTRIN) 800 MG tablet Take 1 tablet (800 mg total) by mouth every 8 (eight) hours as needed for mild pain. 12/06/13  Yes Minaal Struckman N Laquasha Groome, DO    ALLERGIES:  No Known Allergies  SOCIAL HISTORY:  History  Substance Use  Topics  . Smoking status: Current Every Day Smoker -- 0.50 packs/day for 9 years    Types: Cigarettes  . Smokeless tobacco: Never Used  . Alcohol Use: Yes     Comment: occasional    FAMILY HISTORY: Family History  Problem Relation Age of Onset  . Hypertension Mother   . Diabetes Mother   . Asthma Father   . Hypertension Father   . Stroke Maternal Grandmother   . Diabetes Paternal Grandmother   . Hypertension Paternal Grandmother     EXAM: BP 141/83  Pulse 62  Temp(Src) 97.6 F (36.4 C) (Oral)  Resp 20  SpO2 97%  LMP 10/07/2013 CONSTITUTIONAL: Alert and oriented and responds appropriately to questions. Well-appearing; well-nourished HEAD: Normocephalic EYES: Conjunctivae clear, PERRL ENT: normal nose; no rhinorrhea; moist mucous membranes; pharynx without lesions noted NECK: Supple, no meningismus, no LAD  CARD: RRR; S1 and S2 appreciated; no murmurs, no clicks, no rubs, no gallops RESP: Normal chest excursion without splinting or tachypnea; breath sounds clear and equal bilaterally; no wheezes, no rhonchi, no rales,  ABD/GI: Normal bowel sounds; non-distended; soft, non-tender, no rebound, no guarding GU:  Patient does have some irritation of her labia majora bilaterally, no other lesions noted, no sign of cellulitis, no cervical motion tenderness or adnexal tenderness or fullness, no vaginal bleeding, minimal amount of thin white vaginal discharge BACK:  The back appears normal and is non-tender to palpation, there is no CVA tenderness EXT: Normal ROM in all joints;  non-tender to palpation; mild bilateral nonpitting edema in her feet; normal capillary refill; no cyanosis; sensation to light touch intact diffusely, 2+ DP pulses bilaterally, no calf tenderness SKIN: Normal color for age and race; warm NEURO: Moves all extremities equally PSYCH: The patient's mood and manner are appropriate. Grooming and personal hygiene are appropriate.  MEDICAL DECISION MAKING: Patient here  with possible UTI. Urine shows bacteria and leukocytes but also squamous cells. She does have some signs of external vaginitis. Have instructed her to use monistat over-the-counter. We'll discharge with prescription for Macrobid and Diflucan. Have discussed with her that I recommend elevation, compression stockings for her lower extremity swelling. Swelling is bilateral. No concern for pulmonary embolus. She has no risk factors for DVT. Discussed return precautions. Patient verbalizes understanding and is comfortable with plan.      Layla Maw Alyzza Andringa, DO 12/16/13 1646

## 2013-12-16 NOTE — Progress Notes (Signed)
[facility] [employer],  Provided pt with a [employer] application to help patient establish primary care. Patient stated that she had an upcoming apt with the financial counselor at [facility], but could not remember date or time. No upcoming apt with them was listed in system. Encouraged patient to obtain a pcp.

## 2013-12-16 NOTE — ED Notes (Signed)
Initial Contact - pt A+Ox4, ambulatory with steady gait.  Reports c/o recent tx for UTI, completed abx, c/o new vaginal discharge and odor and continued dysuria.  Pt also reports swelling to BLE, ankle pain, which is not new.  Pt reports using compression hose.  Pt denies cp/sob, speaking full/clear sentences, rr even/un-lab.  Pt denies abd pain, n/v/d/c.  Skin PWD.  NAD.

## 2013-12-17 LAB — URINE CULTURE

## 2013-12-17 LAB — GC/CHLAMYDIA PROBE AMP
CT Probe RNA: NEGATIVE
GC PROBE AMP APTIMA: NEGATIVE

## 2014-02-22 ENCOUNTER — Encounter (HOSPITAL_COMMUNITY): Payer: Self-pay | Admitting: Emergency Medicine

## 2014-05-10 ENCOUNTER — Encounter (HOSPITAL_COMMUNITY): Payer: Self-pay | Admitting: Emergency Medicine

## 2014-05-10 ENCOUNTER — Emergency Department (HOSPITAL_COMMUNITY)
Admission: EM | Admit: 2014-05-10 | Discharge: 2014-05-10 | Disposition: A | Discharge status: Home / Self Care | Payer: Self-pay | Attending: Emergency Medicine | Admitting: Emergency Medicine

## 2014-05-10 DIAGNOSIS — Z79899 Other long term (current) drug therapy: Secondary | ICD-10-CM | POA: Insufficient documentation

## 2014-05-10 DIAGNOSIS — K0889 Other specified disorders of teeth and supporting structures: Secondary | ICD-10-CM

## 2014-05-10 DIAGNOSIS — Z3202 Encounter for pregnancy test, result negative: Secondary | ICD-10-CM | POA: Insufficient documentation

## 2014-05-10 DIAGNOSIS — Z7952 Long term (current) use of systemic steroids: Secondary | ICD-10-CM | POA: Insufficient documentation

## 2014-05-10 DIAGNOSIS — Z720 Tobacco use: Secondary | ICD-10-CM | POA: Insufficient documentation

## 2014-05-10 DIAGNOSIS — Z8659 Personal history of other mental and behavioral disorders: Secondary | ICD-10-CM | POA: Insufficient documentation

## 2014-05-10 DIAGNOSIS — N390 Urinary tract infection, site not specified: Secondary | ICD-10-CM | POA: Insufficient documentation

## 2014-05-10 DIAGNOSIS — Z8611 Personal history of tuberculosis: Secondary | ICD-10-CM | POA: Insufficient documentation

## 2014-05-10 DIAGNOSIS — K088 Other specified disorders of teeth and supporting structures: Secondary | ICD-10-CM | POA: Insufficient documentation

## 2014-05-10 LAB — URINALYSIS, ROUTINE W REFLEX MICROSCOPIC
Bilirubin Urine: NEGATIVE
Glucose, UA: NEGATIVE mg/dL
Hgb urine dipstick: NEGATIVE
Ketones, ur: NEGATIVE mg/dL
Nitrite: NEGATIVE
Protein, ur: NEGATIVE mg/dL
Specific Gravity, Urine: 1.02 (ref 1.005–1.030)
Urobilinogen, UA: 1 mg/dL (ref 0.0–1.0)
pH: 6.5 (ref 5.0–8.0)

## 2014-05-10 LAB — URINE MICROSCOPIC-ADD ON

## 2014-05-10 LAB — POC URINE PREG, ED
PREG TEST UR: NEGATIVE
Preg Test, Ur: NEGATIVE

## 2014-05-10 MED ORDER — HYDROCODONE-ACETAMINOPHEN 5-325 MG PO TABS
1.0000 | ORAL_TABLET | Freq: Once | ORAL | Status: DC
  Administered 2014-05-10: 1 via ORAL
  Filled 2014-05-10: qty 1

## 2014-05-10 MED ORDER — CEPHALEXIN 500 MG PO CAPS
500.0000 mg | ORAL_CAPSULE | Freq: Once | ORAL | Status: AC
  Administered 2014-05-10: 500 mg via ORAL
  Filled 2014-05-10: qty 1

## 2014-05-10 MED ORDER — BUPIVACAINE-EPINEPHRINE (PF) 0.5% -1:200000 IJ SOLN
1.8000 mL | Freq: Once | INTRAMUSCULAR | Status: AC
  Administered 2014-05-10: 1.8 mL
  Filled 2014-05-10: qty 1.8

## 2014-05-10 MED ORDER — OXYCODONE-ACETAMINOPHEN 5-325 MG PO TABS: 1.0000 | ORAL_TABLET | ORAL | Status: DC | PRN

## 2014-05-10 MED ORDER — CEPHALEXIN 500 MG PO CAPS: 500.0000 mg | ORAL_CAPSULE | Freq: Two times a day (BID) | ORAL | Status: DC

## 2014-05-10 NOTE — ED Provider Notes (Signed)
CSN: [id number]     Arrival date & time 05/10/14  1038 History   First MD Initiated Contact with Patient 05/10/14 1100     Chief Complaint  Patient presents with  . Abscess  . Urinary Tract Infection     (Consider location/radiation/quality/duration/timing/severity/associated sxs/prior Treatment) HPI  Ann Moran is a 29 y.o. female complaining of pain to right upper jaw worsening over the course of 4 days. Patient has taken Tylenol PM with little relief. She denies fever, chills, trouble swallowing, swelling, shortness of breath. Patient also endorses a decreased urinary output with concentrated and foul-smelling urine. Denies vaginal discharge, fever, chills, abdominal pain, flank pain, nausea vomiting.  Past Medical History  Diagnosis Date  . Pregnancy induced hypertension   . Urinary tract infection   . Depression     hx of meds, none currently- doing ok  . MRSA (methicillin resistant staph aureus) culture positive     Dec 2012   Past Surgical History  Procedure Laterality Date  . Ectopic pregnancy surgery    . Cholecystectomy     Family History  Problem Relation Age of Onset  . Hypertension Mother   . Diabetes Mother   . Asthma Father   . Hypertension Father   . Stroke Maternal Grandmother   . Diabetes Paternal Grandmother   . Hypertension Paternal Grandmother    History  Substance Use Topics  . Smoking status: Current Every Day Smoker -- 0.50 packs/day for 9 years    Types: Cigarettes  . Smokeless tobacco: Never Used  . Alcohol Use: Yes     Comment: occasional   OB History    Gravida Para Term Preterm AB TAB SAB Ectopic Multiple Living   2 1 0 Review of Systems  10 systems reviewed and found to be negative, except as noted in the HPI.   Allergies  Review of patient's allergies indicates no known allergies.  Home Medications   Prior to Admission medications   Medication Sig Start Date End Date Taking? Authorizing Provider   cephALEXin (KEFLEX) 500 MG capsule Take 1 capsule (500 mg total) by mouth 2 (two) times daily. 05/10/14   Markez Dowland, PA-C  diphenhydrAMINE (BENADRYL) 25 mg capsule Take 75 mg by mouth every 6 (six) hours as needed for allergies.    Historical Provider, MD  diphenhydramine-acetaminophen (TYLENOL PM) 25-500 MG TABS Take 3 tablets by mouth at bedtime as needed (for sleep).    Historical Provider, MD  fluconazole (DIFLUCAN) 150 MG tablet Take 1 tablet (150 mg total) by mouth once. 12/16/13   Kristen N Ward, DO  hydrocortisone 2.5 % cream Apply 1 application topically 2 (two) times daily.    Historical Provider, MD  ibuprofen (ADVIL,MOTRIN) 800 MG tablet Take 1 tablet (800 mg total) by mouth every 8 (eight) hours as needed for mild pain. 12/06/13   Kristen N Ward, DO  nitrofurantoin, macrocrystal-monohydrate, (MACROBID) 100 MG capsule Take 1 capsule (100 mg total) by mouth 2 (two) times daily. 12/16/13   Kristen N Ward, DO  oxyCODONE-acetaminophen (ROXICET) 5-325 MG per tablet Take 1 tablet by mouth every 4 (four) hours as needed for severe pain. 05/10/14   Mio Schellinger, PA-C   Pulse 76  Temp(Src) 98.3 F (36.8 C) (Oral)  Resp 18  SpO2 99% Physical Exam  Constitutional: She is oriented to person, place, and time. She appears well-developed and well-nourished. No distress.  HENT:  Head: Normocephalic.  Mouth/Throat: Oropharynx  is clear and moist.  Generally poor dentition, no gingival swelling, erythema or tenderness to palpation. Patient is handling their secretions. There is no tenderness to palpation or firmness underneath tongue bilaterally. No trismus.    Pt has braces attached to pull teeth with no wiring in place. States that these have been on for 3 years without being used.  Eyes: Conjunctivae and EOM are normal. Pupils are equal, round, and reactive to light.  Neck: Normal range of motion.  Cardiovascular: Normal rate, regular rhythm and intact distal pulses.   Pulmonary/Chest:  Effort normal and breath sounds normal. No stridor. No respiratory distress. She has no wheezes. She has no rales. She exhibits no tenderness.  Abdominal: Soft. Bowel sounds are normal. She exhibits no distension and no mass. There is no tenderness. There is no rebound and no guarding.  Genitourinary:  No CVA tenderness palpation bilaterally  Musculoskeletal: Normal range of motion.  Neurological: She is alert and oriented to person, place, and time.  Psychiatric: She has a normal mood and affect.  Nursing note and vitals reviewed.   ED Course  NERVE BLOCK Date/Time: 05/10/2014 12:21 PM Performed by: Wynetta Emery Authorized by: Wynetta Emery Consent: Verbal consent obtained. Consent given by: patient Required items: required blood products, implants, devices, and special equipment available Patient identity confirmed: verbally with patient Indications: pain relief Body area: face/mouth Nerve: posterior superior alveolar Laterality: right Patient sedated: no Preparation: Patient was prepped and draped in the usual sterile fashion. Patient position: sitting Needle gauge: 27 G Local anesthetic: bupivacaine 0.5% with epinephrine Anesthetic total: 1.8 ml Outcome: pain improved Patient tolerance: Patient tolerated the procedure well with no immediate complications   (including critical care time) Labs Review Labs Reviewed  URINALYSIS, ROUTINE W REFLEX MICROSCOPIC - Abnormal; Notable for the following:    APPearance CLOUDY (*)    Leukocytes, UA LARGE (*)    All other components within normal limits  URINE MICROSCOPIC-ADD ON - Abnormal; Notable for the following:    Squamous Epithelial / LPF MANY (*)    Bacteria, UA MANY (*)    All other components within normal limits  URINE CULTURE  POC URINE PREG, ED  POC URINE PREG, ED    Imaging Review No results found.   EKG Interpretation None      MDM   Final diagnoses:  Pain, dental  UTI (lower urinary tract  infection)    Filed Vitals:   05/10/14 1049  Pulse: 76  Temp: 98.3 F (36.8 C)  TempSrc: Oral  Resp: 18  SpO2: 99%    Medications  bupivacaine-epinephrine (MARCAINE W/ EPI) 0.5% -1:200000 injection 1.8 mL (1.8 mLs Infiltration Given by Other 05/10/14 1133)  cephALEXin (KEFLEX) capsule 500 mg (500 mg Oral Given 05/10/14 1211)    Ann Moran is a pleasant 29 y.o. female presenting with dental pain, no overt signs of dental abscess. Superior posterior alveolar block given with good relief. Patient is also reporting concentrated urine and reduced urinary output. No abnormal vaginal discharge, no abdominal pain. No signs or symptoms of pyelonephritis. Urinalysis is contaminated but will treat with Keflex based on symptoms. Urine culture pending. Return precautions for pyelonephritis discussed.  Evaluation does not show pathology that would require ongoing emergent intervention or inpatient treatment. Pt is hemodynamically stable and mentating appropriately. Discussed findings and plan with patient/guardian, who agrees with care plan. All questions answered. Return precautions discussed and outpatient follow up given.   New Prescriptions   CEPHALEXIN (KEFLEX) 500 MG CAPSULE  Take 1 capsule (500 mg total) by mouth 2 (two) times daily.   OXYCODONE-ACETAMINOPHEN (ROXICET) 5-325 MG PER TABLET    Take 1 tablet by mouth every 4 (four) hours as needed for severe pain.         Wynetta Emeryicole Marceil Welp, PA-C 05/10/14 1222  Toy BakerAnthony T Allen, MD 05/11/14 [phone]

## 2014-05-10 NOTE — ED Notes (Signed)
Pt states that she has abscess to right upper mouth, has hx of same ad states this feels like the same. Pt also states she thinks she has a UTI due to darker urine, abd pain, and lower back pain. Pt states she has urgency but is going less.

## 2014-05-10 NOTE — Discharge Instructions (Signed)
Take percocet for breakthrough pain, do not drink alcohol, drive, care for children or do other critical tasks while taking percocet. ° °Return to the emergency room for fever, change in vision, redness to the face that rapidly spreads towards the eye, nausea or vomiting, difficulty swallowing or shortness of breath. °  °Apply warm compresses to jaw throughout the day.  ° ° Take your antibiotics as directed and to the end of the course.  ° °Followup with a dentist is very important for ongoing evaluation and management of recurrent dental pain. Return to emergency department for emergent changing or worsening symptoms." ° °Low-cost dental clinic: °**David  [facility]ils  at [phone]**  °**Janna Civils at [phone] [address]**   ° °You may also call [phone] ° °Dental Assistance °If the dentist on-call cannot see you, please use the resources below: ° ° °Patients with Medicaid: [facility] °[address], [phone] °[address], [phone] ° °If unable to pay, or uninsured, contact [facility] ([phone]) or [facility] ([phone] in [place], [phone] in [place]) to become qualified for the adult dental clinic ° °Other Low-Cost Community Dental Services: °[facility]- [address], [place], [place], [address] °   [phone], Ext. [phone] °   2nd and 4th Thursday of the month at 6:30am °   10 clients each day by appointment, can sometimes see walk-in     patients if someone does not show for an appointment °[facility]- [address], [place], [place], [address] °   [phone] °[facility]- [address], [place], [place], [address] °   [phone] ° °[facility]- [phone] °[facility]- [phone] ° [facility]- [phone] ° °

## 2014-05-11 ENCOUNTER — Encounter (HOSPITAL_COMMUNITY): Payer: Self-pay | Admitting: Emergency Medicine

## 2014-05-11 ENCOUNTER — Emergency Department (HOSPITAL_COMMUNITY)
Admission: EM | Admit: 2014-05-11 | Discharge: 2014-05-11 | Disposition: A | Discharge status: Home / Self Care | Payer: Self-pay | Attending: Emergency Medicine | Admitting: Emergency Medicine

## 2014-05-11 DIAGNOSIS — Z720 Tobacco use: Secondary | ICD-10-CM | POA: Insufficient documentation

## 2014-05-11 DIAGNOSIS — Z87440 Personal history of urinary (tract) infections: Secondary | ICD-10-CM | POA: Insufficient documentation

## 2014-05-11 DIAGNOSIS — Z7952 Long term (current) use of systemic steroids: Secondary | ICD-10-CM | POA: Insufficient documentation

## 2014-05-11 DIAGNOSIS — Z8614 Personal history of Methicillin resistant Staphylococcus aureus infection: Secondary | ICD-10-CM | POA: Insufficient documentation

## 2014-05-11 DIAGNOSIS — H9201 Otalgia, right ear: Secondary | ICD-10-CM | POA: Insufficient documentation

## 2014-05-11 DIAGNOSIS — Z79899 Other long term (current) drug therapy: Secondary | ICD-10-CM | POA: Insufficient documentation

## 2014-05-11 DIAGNOSIS — K029 Dental caries, unspecified: Secondary | ICD-10-CM | POA: Insufficient documentation

## 2014-05-11 DIAGNOSIS — K0889 Other specified disorders of teeth and supporting structures: Secondary | ICD-10-CM

## 2014-05-11 DIAGNOSIS — K088 Other specified disorders of teeth and supporting structures: Secondary | ICD-10-CM | POA: Insufficient documentation

## 2014-05-11 DIAGNOSIS — Z8659 Personal history of other mental and behavioral disorders: Secondary | ICD-10-CM | POA: Insufficient documentation

## 2014-05-11 LAB — URINE CULTURE

## 2014-05-11 MED ORDER — LIDOCAINE-EPINEPHRINE (PF) 2 %-1:200000 IJ SOLN
5.0000 mL | Freq: Once | INTRAMUSCULAR | Status: AC
  Administered 2014-05-11: 5 mL
  Filled 2014-05-11: qty 20

## 2014-05-11 MED ORDER — BUPIVACAINE-EPINEPHRINE (PF) 0.5% -1:200000 IJ SOLN
1.8000 mL | Freq: Once | INTRAMUSCULAR | Status: AC
  Administered 2014-05-11: 1.8 mL
  Filled 2014-05-11: qty 1.8

## 2014-05-11 NOTE — ED Notes (Signed)
Pt reports increased pain and tenderness on r/side of face since she was seen for same concern yesterday

## 2014-05-11 NOTE — Discharge Instructions (Signed)
Please read and follow all provided instructions.  Your diagnoses today include:  1. Pain, dental    The exam and treatment you received today has been provided on an emergency basis only. This is not a substitute for complete medical or dental care.  Tests performed today include:  Vital signs. See below for your results today.   Medications prescribed:   None  Take any prescribed medications only as directed.  Home care instructions:  Follow any educational materials contained in this packet.  Continue home pain medications as prescribed.   Follow-up instructions: Please follow-up with your dentist for further evaluation of your symptoms.   Dental Assistance: See below for dental referrals  Return instructions:   Please return to the Emergency Department if you experience worsening symptoms.  Please return if you develop a fever, you develop more swelling in your face or neck, you have trouble breathing or swallowing food.  Please return if you have any other emergent concerns.  Additional Information:  Your vital signs today were: BP 131/89 mmHg   Pulse 100   Temp(Src) 98.7 F (37.1 C) (Oral)   Resp 16   SpO2 97%   LMP 03/07/2014 (Approximate) If your blood pressure (BP) was elevated above 135/85 this visit, please have this repeated by your doctor within one month. -------------- Dental Care: Organization         Address  Phone  Notes  [facility] [address], [place] [phone] Accepts children up to age 29 who are enrolled in [place] or [place] Health Choice; pregnant women with a Medicaid card; and children who have applied for Medicaid or [place] Health Choice, but were declined, whose parents can pay a reduced fee at time of service.  [facility] [place]  [address], [place] [phone] Accepts children up to age 29 who are enrolled in [place] or [place]  Health Choice; pregnant women with a Medicaid card; and children who have applied for Medicaid or  Health Choice, but were declined, whose parents can pay a reduced fee at time of service.  [facility]  [address], [place] [phone] Patients are seen by appointment only. Walk-ins are not accepted. [facility] will see patients 29 years of age and older. Monday - Tuesday (8am-5pm) Most Wednesdays (8:30-5pm) $30 per visit, cash only  [facility]  [address], [facility] [phone] Patients are seen by appointment only. Walk-ins are not accepted. [facility] will see patients 29 years of age and older. One Wednesday Evening (Monthly: Volunteer Based).  $30 per visit, cash only  [facility]  [phone] for adults; Children under age 694, call Graduate Pediatric Dentistry at [phone]. Children aged 94-14, please call [phone] to request a pediatric application.  Dental services are provided in all areas of dental care including fillings, crowns and bridges, complete and partial dentures, implants, gum treatment, root canals, and extractions. Preventive care is also provided. Treatment is provided to both adults and children. Patients are selected via a lottery and there is often a waiting list.   [facility] [address], [place]  [phone] [email or web address]   [facility] [address], [place], [place] [phone], Ext. [phone] Second and Fourth Thursday of each month, opens at 6:30 AM; Clinic ends at 9 AM.  Patients  are seen on a first-come first-served basis, and a limited number are seen during each clinic.   [facility]  [address] [place], [place] [phone]   Eligibility Requirements You must have lived in [place], [place], or [place] counties for at least the last three months.   You cannot be eligible for state or  federal sponsored [place], including [employer], [place], or [employer].   You generally cannot be eligible for healthcare insurance through your employer.    How to apply: Eligibility screenings are held every Tuesday and Wednesday afternoon from 1:00 pm until 4:00 pm. You do not need an appointment for the interview!  [facility] [address], [place], [place] [phone]   [facility]  [phone]   [facility]  [phone]   [facility] Department  [phone]

## 2014-05-11 NOTE — ED Provider Notes (Signed)
CSN: [id number]     Arrival date & time 05/11/14  1808 History  This chart was scribed for Rhea Bleacher, PA-C, working with Mirian Mo, MD by Elon Spanner, ED Scribe. This patient was seen in room WTR9/WTR9 and the patient's care was started at 6:45 PM.   Chief Complaint  Patient presents with  . Dental Pain  . Facial Pain   The history is provided by the patient. No language interpreter was used.   HPI Comments: Ann Moran is a 29 y.o. female who presents to the Emergency Department complaining of worsening right upper dental pain described as throbbing with minor radiation to her right ear onset 5 days ago.  Patient reports she was seen yesterday in the ED and prescribed oxycodone 5-325, which has not afforded relief, and antibiotics.  She was also given a dental block, which afforded transient relief, and told to follow-up with a dentist.  She has also taken tylenol concurrent with the oxycodone without relief.  She currently has a dental appointment on 1/22. No neck swelling, trouble breathing.   Past Medical History  Diagnosis Date  . Pregnancy induced hypertension   . Urinary tract infection   . Depression     hx of meds, none currently- doing ok  . MRSA (methicillin resistant staph aureus) culture positive     Dec 2012   Past Surgical History  Procedure Laterality Date  . Ectopic pregnancy surgery    . Cholecystectomy     Family History  Problem Relation Age of Onset  . Hypertension Mother   . Diabetes Mother   . Asthma Father   . Hypertension Father   . Stroke Maternal Grandmother   . Diabetes Paternal Grandmother   . Hypertension Paternal Grandmother    History  Substance Use Topics  . Smoking status: Current Every Day Smoker -- 0.50 packs/day for 9 years    Types: Cigarettes  . Smokeless tobacco: Never Used  . Alcohol Use: Yes     Comment: occasional   OB History    Gravida Para Term Preterm AB TAB SAB Ectopic Multiple Living   2 1 0 Review of Systems  Constitutional: Negative for fever.  HENT: Positive for dental problem and ear pain. Negative for facial swelling, sore throat and trouble swallowing.   Respiratory: Negative for shortness of breath and stridor.   Musculoskeletal: Negative for neck pain.  Skin: Negative for color change.  Neurological: Negative for headaches.      Allergies  Review of patient's allergies indicates no known allergies.  Home Medications   Prior to Admission medications   Medication Sig Start Date End Date Taking? Authorizing Provider  cephALEXin (KEFLEX) 500 MG capsule Take 1 capsule (500 mg total) by mouth 2 (two) times daily. 05/10/14   Nicole Pisciotta, PA-C  diphenhydrAMINE (BENADRYL) 25 mg capsule Take 75 mg by mouth every 6 (six) hours as needed for allergies.    Historical Provider, MD  diphenhydramine-acetaminophen (TYLENOL PM) 25-500 MG TABS Take 3 tablets by mouth at bedtime as needed (for sleep).    Historical Provider, MD  fluconazole (DIFLUCAN) 150 MG tablet Take 1 tablet (150 mg total) by mouth once. 12/16/13   Kristen N Ward, DO  hydrocortisone 2.5 % cream Apply 1 application topically 2 (two) times daily.    Historical Provider, MD  ibuprofen (ADVIL,MOTRIN) 800 MG tablet Take 1 tablet (800 mg total) by mouth every 8 (eight) hours  as needed for mild pain. 12/06/13   Kristen N Ward, DO  nitrofurantoin, macrocrystal-monohydrate, (MACROBID) 100 MG capsule Take 1 capsule (100 mg total) by mouth 2 (two) times daily. 12/16/13   Kristen N Ward, DO  oxyCODONE-acetaminophen (ROXICET) 5-325 MG per tablet Take 1 tablet by mouth every 4 (four) hours as needed for severe pain. 05/10/14   Nicole Pisciotta, PA-C   BP 131/89 mmHg  Pulse 100  Temp(Src) 98.7 F (37.1 C) (Oral)  Resp 16  SpO2 97%  LMP 03/07/2014 (Approximate)   Physical Exam  Constitutional: She is oriented to person, place, and time. She appears well-developed and well-nourished. No distress.  HENT:  Head:  Normocephalic and atraumatic.  Right Ear: Tympanic membrane, external ear and ear canal normal.  Left Ear: Tympanic membrane, external ear and ear canal normal.  Nose: Nose normal.  Mouth/Throat: Uvula is midline, oropharynx is clear and moist and mucous membranes are normal. No trismus in the jaw. Abnormal dentition. Dental caries present. No dental abscesses or uvula swelling. No tonsillar abscesses.  Patient with R maxillary tooth pain and tenderness to palpation in area of 3rd molar. No swelling or erythema noted on exam. No palpable abscess. Of note, patient with brace fixtures still in place on upper and lower teeth. She cannot afford to get these removed.   Eyes: Conjunctivae and EOM are normal.  Neck: Normal range of motion. Neck supple. No tracheal deviation present.  No neck swelling or Ludwig's angina  Cardiovascular: Normal rate.   Pulmonary/Chest: Effort normal. No respiratory distress.  Musculoskeletal: Normal range of motion.  Lymphadenopathy:    She has no cervical adenopathy.  Neurological: She is alert and oriented to person, place, and time.  Skin: Skin is warm and dry.  Psychiatric: She has a normal mood and affect. Her behavior is normal.  Nursing note and vitals reviewed.   ED Course  Procedures (including critical care time)  DIAGNOSTIC STUDIES: Oxygen Saturation is 99% on RA, normal by my interpretation.    COORDINATION OF CARE:  6:48 PM Will perform dental block.  Patient advised to discontinue Tylenol use if also using her oxycodone rx.  Patient should follow-up with her previously scheduled dental appointment.   Labs Review Labs Reviewed - No data to display  Imaging Review No results found.   EKG Interpretation None       Dental block was performed by Guertin PA-S2 under my supervision. 1.285mL of 2% lidocaine with epi was combined with 1.578mL 0.5% bupivacaine with epi and an alveolar block was performed. Injections made at base of tooth as well as  the tooth immediately posterior and anterior to tooth. Adequate anesthesia was obtained. Minimal bleeding after injections. Patient tolerated procedure well with no immediate complications.   Patient counseled on use of narcotic pain medications. Counseled not to combine these medications with others containing tylenol. Urged not to drink alcohol, drive, or perform any other activities that requires focus while taking these medications. The patient verbalizes understanding and agrees with the plan.  Patient counseled to take prescribed medications as directed, return with worsening facial or neck swelling, and to follow-up with their dentist as soon as possible.     MDM   Final diagnoses:  Pain, dental   Patient with toothache. No fever. Exam unconcerning for Ludwig's angina or other deep tissue infection in neck.   Pt already on pain medication. No indication for abx.   I personally performed the services described in this documentation, which was scribed in  my presence. The recorded information has been reviewed and is accurate.    Renne Crigler, PA-C 05/11/14 1917  Mirian Mo, MD 05/14/14 [phone]

## 2014-05-15 IMAGING — CR DG KNEE COMPLETE 4+V*R*
4 series · 4 of 4 positions shown · non-contrast
Comparison: None.

CLINICAL DATA: Fell.  Persistent right knee pain.

EXAM:
RIGHT KNEE - COMPLETE 4+ VIEW

[t knee ap right *]
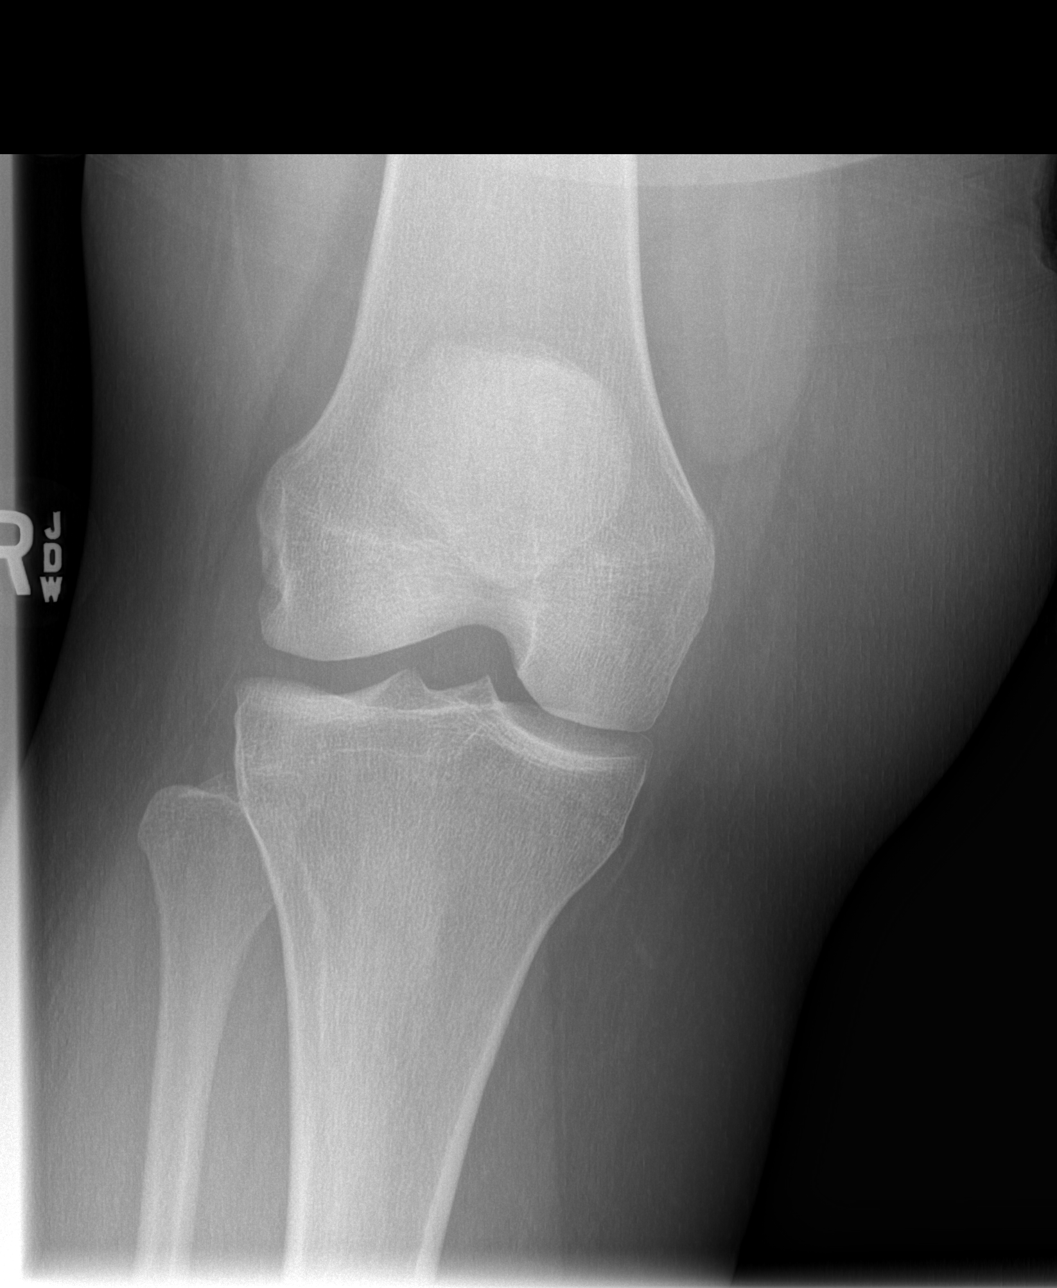

[t knee oblique right * (1 of 2)]
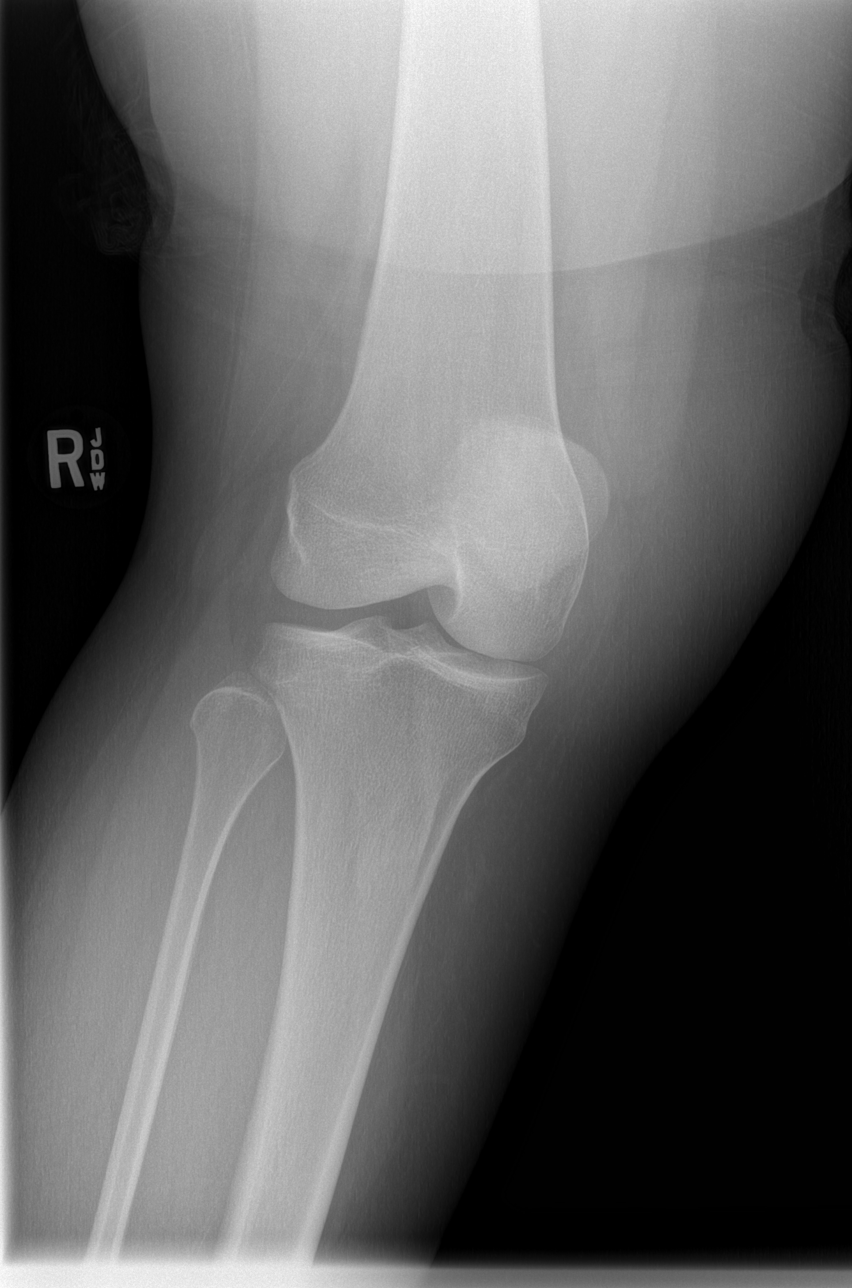

[t knee oblique right * (2 of 2)]
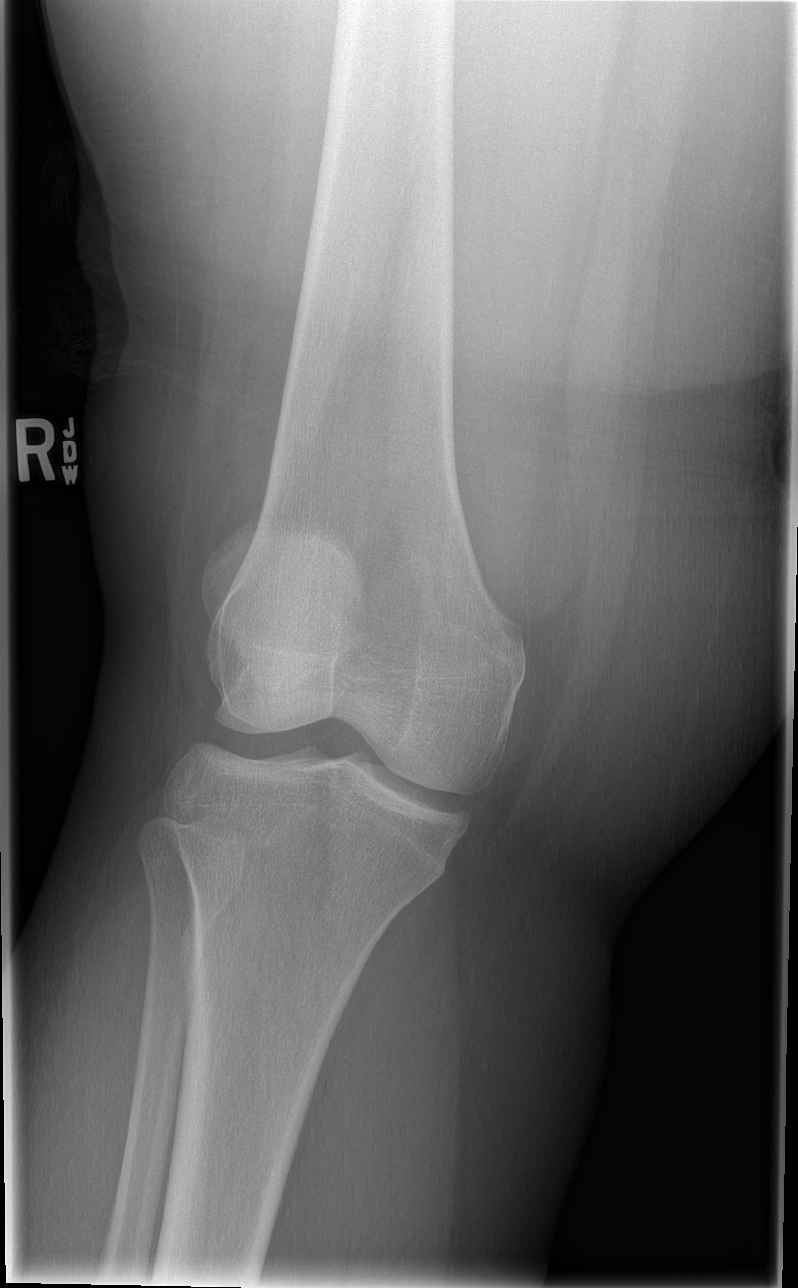

[t knee lat right *]
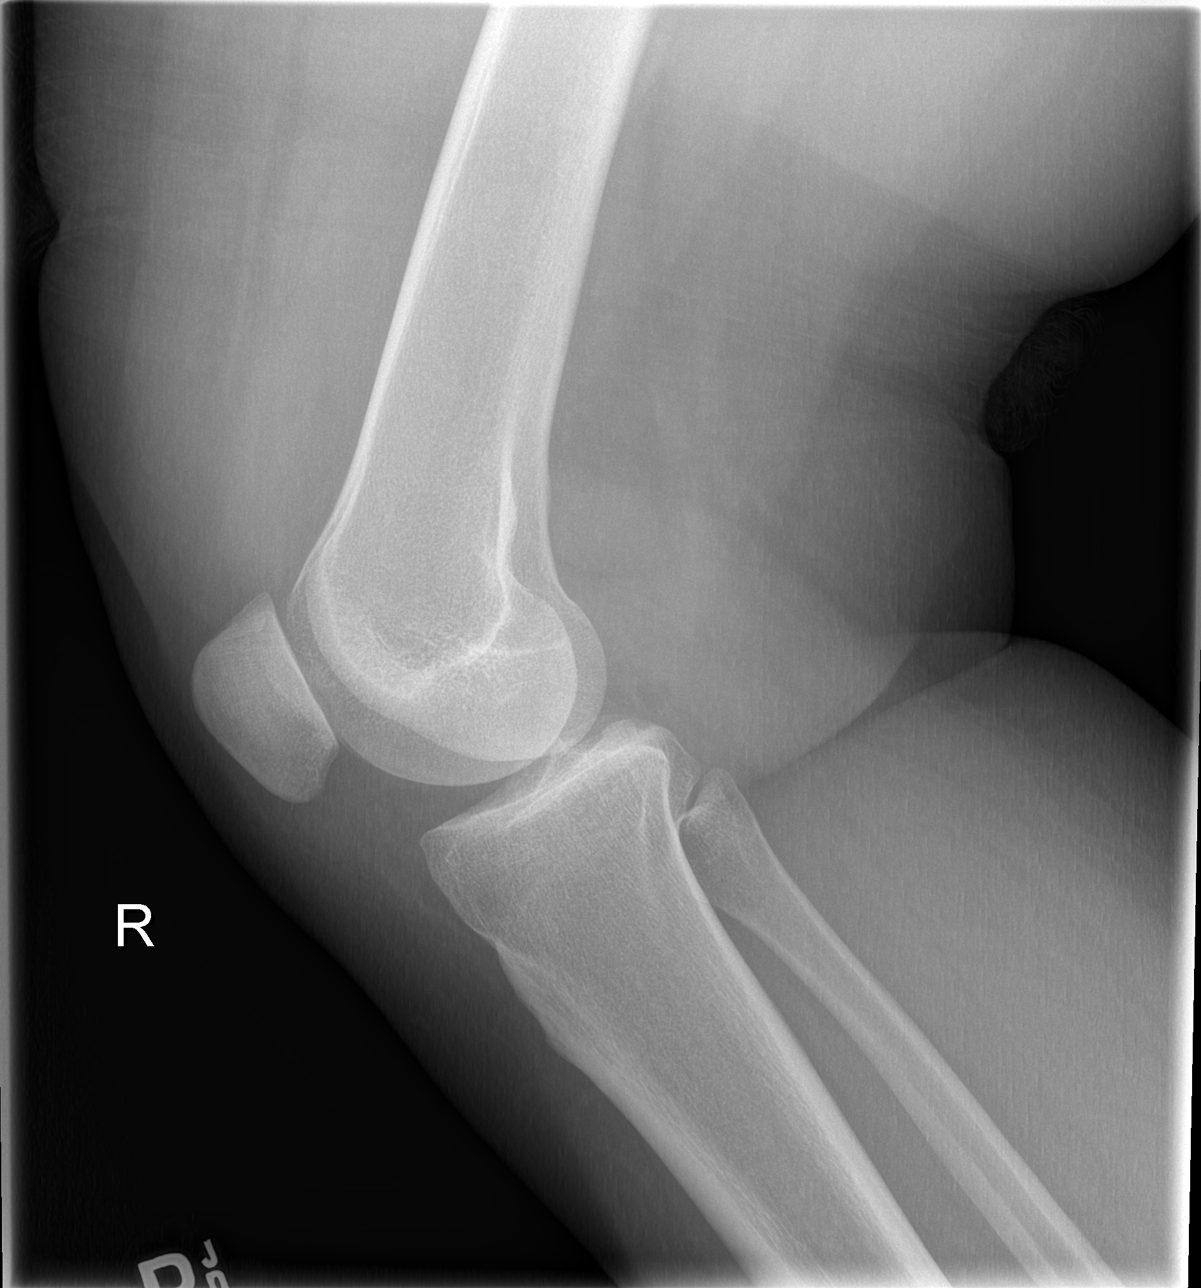

[4 of 4 positions shown; findings below may reference images not displayed]

FINDINGS: The joint spaces are maintained. No acute fracture or osteochondral
abnormality. No obvious joint effusion but exam limited due to body
habitus.
IMPRESSION: No acute fracture.

## 2014-05-21 ENCOUNTER — Inpatient Hospital Stay (HOSPITAL_COMMUNITY)
Admission: AD | Admit: 2014-05-21 | Discharge: 2014-05-22 | Disposition: A | Discharge status: Home / Self Care | Payer: Self-pay | Source: Ambulatory Visit | Attending: Obstetrics & Gynecology | Admitting: Obstetrics & Gynecology

## 2014-05-21 DIAGNOSIS — K5900 Constipation, unspecified: Secondary | ICD-10-CM | POA: Insufficient documentation

## 2014-05-21 DIAGNOSIS — R109 Unspecified abdominal pain: Secondary | ICD-10-CM

## 2014-05-21 DIAGNOSIS — F17210 Nicotine dependence, cigarettes, uncomplicated: Secondary | ICD-10-CM | POA: Insufficient documentation

## 2014-05-21 DIAGNOSIS — E860 Dehydration: Secondary | ICD-10-CM | POA: Insufficient documentation

## 2014-05-21 NOTE — MAU Note (Signed)
Having bad stomach pains lower stomach. Feels like i am constipated but i am not. Just finished Keflex for uti but pain is worse. Pain is similar to uti pain but is worse.

## 2014-05-22 ENCOUNTER — Encounter (HOSPITAL_COMMUNITY): Payer: Self-pay | Admitting: *Deleted

## 2014-05-22 LAB — URINALYSIS, ROUTINE W REFLEX MICROSCOPIC
Bilirubin Urine: NEGATIVE
GLUCOSE, UA: NEGATIVE mg/dL
Hgb urine dipstick: NEGATIVE
KETONES UR: NEGATIVE mg/dL
Leukocytes, UA: NEGATIVE
Nitrite: NEGATIVE
Protein, ur: NEGATIVE mg/dL
UROBILINOGEN UA: 0.2 mg/dL (ref 0.0–1.0)
pH: 5.5 (ref 5.0–8.0)

## 2014-05-22 LAB — WET PREP, GENITAL
CLUE CELLS WET PREP: NONE SEEN
Trich, Wet Prep: NONE SEEN
WBC, Wet Prep HPF POC: NONE SEEN
YEAST WET PREP: NONE SEEN

## 2014-05-22 LAB — POCT PREGNANCY, URINE: PREG TEST UR: NEGATIVE

## 2014-05-22 LAB — CBC
HEMATOCRIT: 38 % (ref 36.0–46.0)
Hemoglobin: 12.4 g/dL (ref 12.0–15.0)
MCH: 30.2 pg (ref 26.0–34.0)
MCHC: 32.6 g/dL (ref 30.0–36.0)
MCV: 92.5 fL (ref 78.0–100.0)
Platelets: 257 10*3/uL (ref 150–400)
RBC: 4.11 MIL/uL (ref 3.87–5.11)
RDW: 12.7 % (ref 11.5–15.5)
WBC: 6.7 10*3/uL (ref 4.0–10.5)

## 2014-05-22 MED ORDER — POLYETHYLENE GLYCOL 3350 17 G PO PACK: 17.0000 g | PACK | Freq: Every day | ORAL | Status: DC

## 2014-05-22 NOTE — Progress Notes (Signed)
Jennifer Rasch NP in earlier to discuss test results and d/c plan. Written and verbal d/c instructions given and understanding voiced. 

## 2014-05-22 NOTE — MAU Provider Note (Signed)
History     CSN: [id number]  Arrival date and time: 05/21/14 2344   First Provider Initiated Contact with Patient 05/22/14 [phone]      Chief Complaint  Patient presents with  . Abdominal Pain   HPI    Ms. Ann Moran is a 29 y.o. [phone] female who presents with stomach pains. The pain is located in her lower stomach; the pain comes and goes. She was seen on 1/18 and 1/19 at [facility]. She was treated for a UTI and a tooth abscess and took the antibiotics as prescribed. She does not feel that the antibiotics helped. She took the narcotic pain medication as well.    She has a history of constipation and her Last BM was yesterday and prior to that it had been 4 days. She does not take pain medication on a regular bases, just as needed when she has a problem.   Denies vaginal discharge; no new sexual partners.  The patient does not drink water at all.   OB History    Gravida Para Term Preterm AB TAB SAB Ectopic Multiple Living   2 1 0 Past Medical History  Diagnosis Date  . Pregnancy induced hypertension   . Urinary tract infection   . Depression     hx of meds, none currently- doing ok  . MRSA (methicillin resistant staph aureus) culture positive     Dec 2012    Past Surgical History  Procedure Laterality Date  . Ectopic pregnancy surgery    . Cholecystectomy      Family History  Problem Relation Age of Onset  . Hypertension Mother   . Diabetes Mother   . Asthma Father   . Hypertension Father   . Stroke Maternal Grandmother   . Diabetes Paternal Grandmother   . Hypertension Paternal Grandmother     History  Substance Use Topics  . Smoking status: Current Every Day Smoker -- 0.50 packs/day for 9 years    Types: Cigarettes  . Smokeless tobacco: Never Used  . Alcohol Use: Yes     Comment: occasional    Allergies: No Known Allergies  Prescriptions prior to admission  Medication Sig Dispense Refill Last Dose  . acetaminophen  (TYLENOL) 500 MG tablet Take 1,500 mg by mouth every 6 (six) hours as needed for moderate pain (pain).   Past Month at Unknown time  . diphenhydramine-acetaminophen (TYLENOL PM) 25-500 MG TABS Take 3 tablets by mouth at bedtime as needed (for sleep).   05/21/2014 at Unknown time  . ibuprofen (ADVIL,MOTRIN) 800 MG tablet Take 1 tablet (800 mg total) by mouth every 8 (eight) hours as needed for mild pain. 30 tablet 0 Past Week at Unknown time  . oxyCODONE-acetaminophen (ROXICET) 5-325 MG per tablet Take 1 tablet by mouth every 4 (four) hours as needed for severe pain. 30 tablet 0 Past Week at Unknown time  . cephALEXin (KEFLEX) 500 MG capsule Take 1 capsule (500 mg total) by mouth 2 (two) times daily. 20 capsule 0 05/11/2014 at Unknown time  . diphenhydrAMINE (BENADRYL) 25 mg capsule Take 75 mg by mouth every 6 (six) hours as needed for allergies.   unknown at unknown time  . hydrocortisone 2.5 % cream Apply 1 application topically 2 (two) times daily.   Completed Course at Unknown time   Results for orders placed or performed during the hospital encounter of 05/21/14 (from the past 48 hour(s))  Urinalysis, Routine w reflex microscopic     Status: Abnormal   Collection Time: 05/22/14 12:05 AM  Result Value Ref Range   Color, Urine YELLOW YELLOW   APPearance CLEAR CLEAR   Specific Gravity, Urine >1.030 (H) 1.005 - 1.030   pH 5.5 5.0 - 8.0   Glucose, UA NEGATIVE NEGATIVE mg/dL   Hgb urine dipstick NEGATIVE NEGATIVE   Bilirubin Urine NEGATIVE NEGATIVE   Ketones, ur NEGATIVE NEGATIVE mg/dL   Protein, ur NEGATIVE NEGATIVE mg/dL   Urobilinogen, UA 0.2 0.0 - 1.0 mg/dL   Nitrite NEGATIVE NEGATIVE   Leukocytes, UA NEGATIVE NEGATIVE    Comment: MICROSCOPIC NOT DONE ON URINES WITH NEGATIVE PROTEIN, BLOOD, LEUKOCYTES, NITRITE, OR GLUCOSE <1000 mg/dL.  Pregnancy, urine POC     Status: None   Collection Time: 05/22/14 12:10 AM  Result Value Ref Range   Preg Test, Ur NEGATIVE NEGATIVE    Comment:         THE SENSITIVITY OF THIS METHODOLOGY IS >24 mIU/mL   CBC     Status: None   Collection Time: 05/22/14  2:25 AM  Result Value Ref Range   WBC 6.7 4.0 - 10.5 K/uL   RBC 4.11 3.87 - 5.11 MIL/uL   Hemoglobin 12.4 12.0 - 15.0 g/dL   HCT 16.138.0 09.636.0 - 04.546.0 %   MCV 92.5 78.0 - 100.0 fL   MCH 30.2 26.0 - 34.0 pg   MCHC 32.6 30.0 - 36.0 g/dL   RDW 40.912.7 81.111.5 - 91.415.5 %   Platelets 257 150 - 400 K/uL  Wet prep, genital     Status: None   Collection Time: 05/22/14  2:30 AM  Result Value Ref Range   Yeast Wet Prep HPF POC NONE SEEN NONE SEEN   Trich, Wet Prep NONE SEEN NONE SEEN   Clue Cells Wet Prep HPF POC NONE SEEN NONE SEEN   WBC, Wet Prep HPF POC NONE SEEN NONE SEEN    Comment: FEW BACTERIA SEEN    Review of Systems  Constitutional: Negative for fever and chills.  Gastrointestinal: Positive for abdominal pain and constipation. Negative for nausea and vomiting.  Genitourinary: Negative for dysuria, urgency, frequency and hematuria.       Denies vaginal discharge.   Musculoskeletal: Positive for back pain (Bilateral lower back pain ).   Physical Exam   Blood pressure 146/82, pulse 75, temperature 98 F (36.7 C), resp. rate 18, height 5\' 7"  (1.702 m), weight 134.446 kg (296 lb 6.4 oz), last menstrual period 03/07/2014, SpO2 99 %.  Physical Exam  Constitutional: She is oriented to person, place, and time. She appears well-developed and well-nourished. No distress.  HENT:  Head: Normocephalic.  Eyes: Pupils are equal, round, and reactive to light.  Neck: Neck supple.  Cardiovascular: Normal rate.   Respiratory: Effort normal and breath sounds normal.  GI: Soft. Normal appearance. There is tenderness in the suprapubic area and left lower quadrant. There is no rigidity, no rebound and no guarding.  Genitourinary:  Vaginal swabs collected by RN   Musculoskeletal: Normal range of motion.  Neurological: She is alert and oriented to person, place, and time.  Skin: Skin is warm. She is not  diaphoretic.  Psychiatric: Her behavior is normal.  Patient sleeping off and on through exam     MAU Course  Procedures  None  MDM UA shows dehydration. CBC Wet prep  GC   Assessment and Plan   A: Constipation secondary to dehydration and narcotic use   P:  Discharge home in stable condition Metamucil as directed on the bottle  RX: Miralax  Follow up with PCP as needed  Increase water intake. 64 ounces of water daily is recommended Stop narcotics  Return to MAU for emergencies   Iona Hansen Rasch, NP 05/22/2014 2:51 AM

## 2014-05-22 NOTE — MAU Note (Signed)
RN  Collected wet prep and GC/Chlam. Pt tol well

## 2014-05-24 LAB — GC/CHLAMYDIA PROBE AMP (~~LOC~~) NOT AT ARMC
Chlamydia: NEGATIVE
NEISSERIA GONORRHEA: NEGATIVE

## 2015-05-06 ENCOUNTER — Encounter (HOSPITAL_COMMUNITY): Payer: Self-pay | Admitting: Emergency Medicine

## 2015-05-06 ENCOUNTER — Emergency Department (HOSPITAL_COMMUNITY)
Admission: EM | Admit: 2015-05-06 | Discharge: 2015-05-06 | Disposition: A | Discharge status: Home / Self Care | Payer: Self-pay | Attending: Emergency Medicine | Admitting: Emergency Medicine

## 2015-05-06 DIAGNOSIS — F17210 Nicotine dependence, cigarettes, uncomplicated: Secondary | ICD-10-CM | POA: Insufficient documentation

## 2015-05-06 DIAGNOSIS — Z3202 Encounter for pregnancy test, result negative: Secondary | ICD-10-CM | POA: Insufficient documentation

## 2015-05-06 DIAGNOSIS — N3090 Cystitis, unspecified without hematuria: Secondary | ICD-10-CM | POA: Insufficient documentation

## 2015-05-06 DIAGNOSIS — Z8614 Personal history of Methicillin resistant Staphylococcus aureus infection: Secondary | ICD-10-CM | POA: Insufficient documentation

## 2015-05-06 DIAGNOSIS — Z87440 Personal history of urinary (tract) infections: Secondary | ICD-10-CM | POA: Insufficient documentation

## 2015-05-06 DIAGNOSIS — Z8659 Personal history of other mental and behavioral disorders: Secondary | ICD-10-CM | POA: Insufficient documentation

## 2015-05-06 LAB — URINE MICROSCOPIC-ADD ON

## 2015-05-06 LAB — URINALYSIS, ROUTINE W REFLEX MICROSCOPIC
Glucose, UA: NEGATIVE mg/dL
Hgb urine dipstick: NEGATIVE
KETONES UR: 15 mg/dL — AB
NITRITE: NEGATIVE
PH: 5.5 (ref 5.0–8.0)
Protein, ur: NEGATIVE mg/dL
SPECIFIC GRAVITY, URINE: 1.036 — AB (ref 1.005–1.030)

## 2015-05-06 LAB — POC URINE PREG, ED: Preg Test, Ur: NEGATIVE

## 2015-05-06 MED ORDER — PHENAZOPYRIDINE HCL 200 MG PO TABS: 200.0000 mg | ORAL_TABLET | Freq: Three times a day (TID) | ORAL | Status: DC

## 2015-05-06 MED ORDER — CEPHALEXIN 500 MG PO CAPS: 500.0000 mg | ORAL_CAPSULE | Freq: Two times a day (BID) | ORAL | Status: DC

## 2015-05-06 NOTE — Discharge Instructions (Signed)
Ms. Fransico HimMelanie E Zangara,  Nice meeting you! Please follow-up with your primary care provider. Return to the emergency department if you develop fevers, chills, increased abdominal pain. The pyridium will turn your urine orange. Feel better soon!  S. Lane HackerNicole Steffani Dionisio, PA-C   Urinary Tract Infection Urinary tract infections (UTIs) can develop anywhere along your urinary tract. Your urinary tract is your body's drainage system for removing wastes and extra water. Your urinary tract includes two kidneys, two ureters, a bladder, and a urethra. Your kidneys are a pair of bean-shaped organs. Each kidney is about the size of your fist. They are located below your ribs, one on each side of your spine. CAUSES Infections are caused by microbes, which are microscopic organisms, including fungi, viruses, and bacteria. These organisms are so small that they can only be seen through a microscope. Bacteria are the microbes that most commonly cause UTIs. SYMPTOMS  Symptoms of UTIs may vary by age and gender of the patient and by the location of the infection. Symptoms in young women typically include a frequent and intense urge to urinate and a painful, burning feeling in the bladder or urethra during urination. Older women and men are more likely to be tired, shaky, and weak and have muscle aches and abdominal pain. A fever may mean the infection is in your kidneys. Other symptoms of a kidney infection include pain in your back or sides below the ribs, nausea, and vomiting. DIAGNOSIS To diagnose a UTI, your caregiver will ask you about your symptoms. Your caregiver will also ask you to provide a urine sample. The urine sample will be tested for bacteria and white blood cells. White blood cells are made by your body to help fight infection. TREATMENT  Typically, UTIs can be treated with medication. Because most UTIs are caused by a bacterial infection, they usually can be treated with the use of antibiotics. The choice of  antibiotic and length of treatment depend on your symptoms and the type of bacteria causing your infection. HOME CARE INSTRUCTIONS  If you were prescribed antibiotics, take them exactly as your caregiver instructs you. Finish the medication even if you feel better after you have only taken some of the medication.  Drink enough water and fluids to keep your urine clear or pale yellow.  Avoid caffeine, tea, and carbonated beverages. They tend to irritate your bladder.  Empty your bladder often. Avoid holding urine for long periods of time.  Empty your bladder before and after sexual intercourse.  After a bowel movement, women should cleanse from front to back. Use each tissue only once. SEEK MEDICAL CARE IF:   You have back pain.  You develop a fever.  Your symptoms do not begin to resolve within 3 days. SEEK IMMEDIATE MEDICAL CARE IF:   You have severe back pain or lower abdominal pain.  You develop chills.  You have nausea or vomiting.  You have continued burning or discomfort with urination. MAKE SURE YOU:   Understand these instructions.  Will watch your condition.  Will get help right away if you are not doing well or get worse.   This information is not intended to replace advice given to you by your health care provider. Make sure you discuss any questions you have with your health care provider.   Document Released: 01/17/2005 Document Revised: 12/29/2014 Document Reviewed: 05/18/2011 Elsevier Interactive Patient Education [employer]2016 [employer] Inc.

## 2015-05-06 NOTE — ED Provider Notes (Signed)
CSN: [id number]     Arrival date & time 05/06/15  0034 History   First MD Initiated Contact with Patient 05/06/15 0622     Chief Complaint  Patient presents with  . Abdominal Pain   HPI Ann Moran is a 30 y.o. F PMH significant for UTI presenting with urinary cramping since New Year's but it got worse today. She endorses suprapubic abdominal pain, and she describes it as intermittent, only when she urinates, crampy, similar to UTI and ovarian cyst pain she has had in the past, 8/10 pain scale, a pressure. She denies fevers, chills, nausea, vomiting, diarrhea ill contacts, recent abx use, vaginal complaints.   Past Medical History  Diagnosis Date  . Pregnancy induced hypertension   . Urinary tract infection   . Depression     hx of meds, none currently- doing ok  . MRSA (methicillin resistant staph aureus) culture positive     Dec 2012   Past Surgical History  Procedure Laterality Date  . Ectopic pregnancy surgery    . Cholecystectomy     Family History  Problem Relation Age of Onset  . Hypertension Mother   . Diabetes Mother   . Asthma Father   . Hypertension Father   . Stroke Maternal Grandmother   . Diabetes Paternal Grandmother   . Hypertension Paternal Grandmother    Social History  Substance Use Topics  . Smoking status: Current Every Day Smoker -- 0.50 packs/day for 9 years    Types: Cigarettes  . Smokeless tobacco: Never Used  . Alcohol Use: Yes     Comment: occasional   OB History    Gravida Para Term Preterm AB TAB SAB Ectopic Multiple Living   2 1 0 1 1   1  1      Review of Systems  Ten systems are reviewed and are negative for acute change except as noted in the HPI  Allergies  Review of patient's allergies indicates no known allergies.  Home Medications   Prior to Admission medications   Medication Sig Start Date End Date Taking? Authorizing Provider  acetaminophen (TYLENOL) 500 MG tablet Take 1,500 mg by mouth every 6 (six) hours as needed  for moderate pain (pain).    Historical Provider, MD  cephALEXin (KEFLEX) 500 MG capsule Take 1 capsule (500 mg total) by mouth 2 (two) times daily. 05/10/14   Nicole Pisciotta, PA-C  diphenhydrAMINE (BENADRYL) 25 mg capsule Take 75 mg by mouth every 6 (six) hours as needed for allergies.    Historical Provider, MD  hydrocortisone 2.5 % cream Apply 1 application topically 2 (two) times daily.    Historical Provider, MD  ibuprofen (ADVIL,MOTRIN) 800 MG tablet Take 1 tablet (800 mg total) by mouth every 8 (eight) hours as needed for mild pain. 12/06/13   Kristen N Ward, DO  polyethylene glycol (MIRALAX / GLYCOLAX) packet Take 17 g by mouth daily. 05/22/14   Harolyn RutherfordJennifer I Rasch, NP   BP 145/96 mmHg  Pulse 83  Temp(Src) 98 F (36.7 C) (Oral)  Resp 18  Ht 5\' 7"  (1.702 m)  Wt 122.471 kg  BMI 42.28 kg/m2  SpO2 95%  LMP 04/12/2015 Physical Exam  Constitutional: She appears well-developed and well-nourished. No distress.  HENT:  Head: Normocephalic and atraumatic.  Mouth/Throat: Oropharynx is clear and moist. No oropharyngeal exudate.  Eyes: Conjunctivae are normal. Pupils are equal, round, and reactive to light. Right eye exhibits no discharge. Left eye exhibits no discharge. No scleral icterus.  Neck: No  tracheal deviation present.  Cardiovascular: Normal rate, regular rhythm, normal heart sounds and intact distal pulses.  Exam reveals no gallop and no friction rub.   No murmur heard. Pulmonary/Chest: Effort normal and breath sounds normal. No respiratory distress. She has no wheezes. She has no rales. She exhibits no tenderness.  Abdominal: Soft. Bowel sounds are normal. She exhibits no distension and no mass. There is tenderness. There is no rebound and no guarding.  Mild, diffuse abdominal tenderness  Musculoskeletal: She exhibits no edema.  Lymphadenopathy:    She has no cervical adenopathy.  Neurological: She is alert. Coordination normal.  Skin: Skin is warm and dry. No rash noted. She is  not diaphoretic. No erythema.  Psychiatric: She has a normal mood and affect. Her behavior is normal.  Nursing note and vitals reviewed.   ED Course  Procedures  Labs Review Labs Reviewed  URINALYSIS, ROUTINE W REFLEX MICROSCOPIC (NOT AT [facility]) - Abnormal; Notable for the following:    Color, Urine AMBER (*)    APPearance CLOUDY (*)    Specific Gravity, Urine 1.036 (*)    Bilirubin Urine SMALL (*)    Ketones, ur 15 (*)    Leukocytes, UA SMALL (*)    All other components within normal limits  URINE MICROSCOPIC-ADD ON - Abnormal; Notable for the following:    Squamous Epithelial / LPF 6-30 (*)    Bacteria, UA RARE (*)    Crystals CA OXALATE CRYSTALS (*)    All other components within normal limits  POC URINE PREG, ED   MDM   Final diagnoses:  Cystitis   Patient non-toxic appearing and VSS. Based on patient history and physical exam, most likely etiologies include UTI. Less likely etiologies include early appendicitis, mesenteric ischemia, gastroenteritis, peritonitis, AAA, SBO, large bowel obstruction/volvulus, SBP, IBD, colitis, DKA, sickle cell crisis, IBS, food allergy. UA with small bili, 15 ketones, small leukocytes, 6-30 WBCs. Do not suspect nephrolithiasis causing pain. Although UA likely contaminated given 6-30 squamous epithelial cells, will treat for UTI. Patient may be safely discharged home with keflex and pyridium. Discussed reasons for return. Patient to follow-up with primary care provider within one week. Patient in understanding and agreement with the plan.   Melton Krebs, PA-C 05/07/15 2043  Cy Blamer, MD 05/09/15 0010

## 2015-05-06 NOTE — ED Notes (Signed)
Pt reports she has had cold sx x 2 weeks and has not drank much water but over the past 2 days c.o lower abdominal pain and pressure when she urinates. sts it feels like I have a UTI.

## 2015-10-26 ENCOUNTER — Emergency Department (HOSPITAL_COMMUNITY)
Admission: EM | Admit: 2015-10-26 | Discharge: 2015-10-26 | Disposition: A | Discharge status: Home / Self Care | Payer: Self-pay | Attending: Emergency Medicine | Admitting: Emergency Medicine

## 2015-10-26 ENCOUNTER — Encounter (HOSPITAL_COMMUNITY): Payer: Self-pay

## 2015-10-26 DIAGNOSIS — F17210 Nicotine dependence, cigarettes, uncomplicated: Secondary | ICD-10-CM | POA: Insufficient documentation

## 2015-10-26 DIAGNOSIS — A084 Viral intestinal infection, unspecified: Secondary | ICD-10-CM | POA: Insufficient documentation

## 2015-10-26 LAB — COMPREHENSIVE METABOLIC PANEL
ALK PHOS: 53 U/L (ref 38–126)
ALT: 14 U/L (ref 14–54)
ANION GAP: 9 (ref 5–15)
AST: 16 U/L (ref 15–41)
Albumin: 4.1 g/dL (ref 3.5–5.0)
BUN: 6 mg/dL (ref 6–20)
CALCIUM: 9.3 mg/dL (ref 8.9–10.3)
CO2: 25 mmol/L (ref 22–32)
Chloride: 105 mmol/L (ref 101–111)
Creatinine, Ser: 1 mg/dL (ref 0.44–1.00)
Glucose, Bld: 101 mg/dL — ABNORMAL HIGH (ref 65–99)
Potassium: 3.6 mmol/L (ref 3.5–5.1)
SODIUM: 139 mmol/L (ref 135–145)
TOTAL PROTEIN: 7.3 g/dL (ref 6.5–8.1)
Total Bilirubin: 0.5 mg/dL (ref 0.3–1.2)

## 2015-10-26 LAB — CBC
HCT: 43.4 % (ref 36.0–46.0)
HEMOGLOBIN: 14.1 g/dL (ref 12.0–15.0)
MCH: 30.7 pg (ref 26.0–34.0)
MCHC: 32.5 g/dL (ref 30.0–36.0)
MCV: 94.3 fL (ref 78.0–100.0)
PLATELETS: 315 10*3/uL (ref 150–400)
RBC: 4.6 MIL/uL (ref 3.87–5.11)
RDW: 12.9 % (ref 11.5–15.5)
WBC: 7.1 10*3/uL (ref 4.0–10.5)

## 2015-10-26 LAB — URINE MICROSCOPIC-ADD ON

## 2015-10-26 LAB — URINALYSIS, ROUTINE W REFLEX MICROSCOPIC
Glucose, UA: NEGATIVE mg/dL
KETONES UR: 15 mg/dL — AB
NITRITE: NEGATIVE
Protein, ur: 30 mg/dL — AB
SPECIFIC GRAVITY, URINE: 1.027 (ref 1.005–1.030)
pH: 6 (ref 5.0–8.0)

## 2015-10-26 LAB — I-STAT BETA HCG BLOOD, ED (MC, WL, AP ONLY): I-stat hCG, quantitative: <5 m[IU]/mL (ref <5)

## 2015-10-26 LAB — LIPASE, BLOOD: Lipase: 14 U/L (ref 11–51)

## 2015-10-26 MED ORDER — SODIUM CHLORIDE 0.9 % IV BOLUS (SEPSIS)
1000.0000 mL | Freq: Once | INTRAVENOUS | Status: AC
  Administered 2015-10-26: 1000 mL via INTRAVENOUS

## 2015-10-26 MED ORDER — ONDANSETRON HCL 4 MG/2ML IJ SOLN
4.0000 mg | Freq: Once | INTRAMUSCULAR | Status: AC
  Administered 2015-10-26: 4 mg via INTRAVENOUS
  Filled 2015-10-26: qty 2

## 2015-10-26 MED ORDER — ONDANSETRON HCL 4 MG PO TABS: 4.0000 mg | ORAL_TABLET | Freq: Four times a day (QID) | ORAL | Status: DC

## 2015-10-26 NOTE — Discharge Instructions (Signed)
Please read and follow all provided instructions.  Your diagnoses today include:  1. Viral gastroenteritis    Tests performed today include:  Vital signs. See below for your results today.   Medications prescribed:   Take as prescribed   Home care instructions:  Follow any educational materials contained in this packet.  Follow-up instructions: Please follow-up with your primary care provider for further evaluation of symptoms and treatment   Return instructions:   Please return to the Emergency Department if you do not get better, if you get worse, or new symptoms OR  - Fever (temperature greater than 101.22F)  - Bleeding that does not stop with holding pressure to the area    -Severe pain (please note that you may be more sore the day after your accident)  - Chest Pain  - Difficulty breathing  - Severe nausea or vomiting  - Inability to tolerate food and liquids  - Passing out  - Skin becoming red around your wounds  - Change in mental status (confusion or lethargy)  - New numbness or weakness     Please return if you have any other emergent concerns.  Additional Information:  Your vital signs today were: BP 147/100 mmHg   Pulse 89   Temp(Src) 98.5 F (36.9 C) (Oral)   Resp 16   Ht 5\' 8"  (1.727 m)   Wt 108.863 kg   BMI 36.50 kg/m2   SpO2 97%   LMP 10/05/2015 If your blood pressure (BP) was elevated above 135/85 this visit, please have this repeated by your doctor within one month. ---------------

## 2015-10-26 NOTE — ED Notes (Signed)
Patient here with abdominal cramping with vomiting and diarrhea since Monday. No vomiting on arrival and symptoms started again this am after eating potato chips. NAD

## 2015-10-26 NOTE — ED Notes (Signed)
Pt verbalized understanding of d/c instructions, prescriptions, and follow-up care. No further questions/concerns, VSS, ambulatory w/ steady gait (refused wheelchair) 

## 2015-10-26 NOTE — ED Provider Notes (Signed)
CSN: [id number]     Arrival date & time 10/26/15  1013 History   First MD Initiated Contact with Patient 10/26/15 1024     Chief Complaint  Patient presents with  . Nausea  . Diarrhea   (Consider location/radiation/quality/duration/timing/severity/associated sxs/prior Treatment) HPI 30 y.o. female presents to the Emergency Department today complaining of N/V/D since Monday. Noted 5 bouts of each. Similar symptoms Tuesday. Less today. Pt did eat cookout on Sunday and is unsure if meat was cooked all the way. No blood in stool or emesis. No ABD pain associated with ingestion. No fevers. No CP/SOB. No headaches. No vision changes. No fevers. No dysuria.  Able to tolerate PO fluids. Has not tried OTC remedies. States pain currently in epigastrium and feels like a dull ache. Rates 6/10. States that it's likely from throwing up. No other symptoms noted.    Past Medical History  Diagnosis Date  . Pregnancy induced hypertension   . Urinary tract infection   . Depression     hx of meds, none currently- doing ok  . MRSA (methicillin resistant staph aureus) culture positive     Dec 2012   Past Surgical History  Procedure Laterality Date  . Ectopic pregnancy surgery    . Cholecystectomy     Family History  Problem Relation Age of Onset  . Hypertension Mother   . Diabetes Mother   . Asthma Father   . Hypertension Father   . Stroke Maternal Grandmother   . Diabetes Paternal Grandmother   . Hypertension Paternal Grandmother    Social History  Substance Use Topics  . Smoking status: Current Every Day Smoker -- 0.50 packs/day for 9 years    Types: Cigarettes  . Smokeless tobacco: Never Used  . Alcohol Use: Yes     Comment: occasional   OB History    Gravida Para Term Preterm AB TAB SAB Ectopic Multiple Living   2 1 0 1 1   1  1      Review of Systems ROS reviewed and all are negative for acute change except as noted in the HPI.  Allergies  Review of patient's allergies indicates no  known allergies.  Home Medications   Prior to Admission medications   Medication Sig Start Date End Date Taking? Authorizing Provider  diphenhydrAMINE (BENADRYL) 25 mg capsule Take 75 mg by mouth every 6 (six) hours as needed for allergies.   Yes Historical Provider, MD   BP 147/100 mmHg  Pulse 89  Temp(Src) 98.5 F (36.9 C) (Oral)  Resp 16  Ht 5\' 8"  (1.727 m)  Wt 108.863 kg  BMI 36.50 kg/m2  SpO2 97%  LMP 10/05/2015   Physical Exam  Constitutional: She is oriented to person, place, and time. She appears well-developed and well-nourished.  HENT:  Head: Normocephalic and atraumatic.  Eyes: EOM are normal. Pupils are equal, round, and reactive to light.  Neck: Normal range of motion. Neck supple.  Cardiovascular: Normal rate, regular rhythm, normal heart sounds and intact distal pulses.   No murmur heard. Pulmonary/Chest: Effort normal and breath sounds normal. No respiratory distress. She has no wheezes. She has no rales. She exhibits no tenderness.  Abdominal: Soft. Normal appearance and bowel sounds are normal. There is no tenderness. There is no rigidity, no rebound, no guarding, no tenderness at McBurney's point and negative Murphy's sign.  Abdomen soft. Non tender  Musculoskeletal: Normal range of motion.  Neurological: She is alert and oriented to person, place, and time.  Skin: Skin  is warm and dry.  Psychiatric: She has a normal mood and affect. Her behavior is normal. Thought content normal.  Nursing note and vitals reviewed.  ED Course  Procedures (including critical care time) Labs Review Labs Reviewed  COMPREHENSIVE METABOLIC PANEL - Abnormal; Notable for the following:    Glucose, Bld 101 (*)    All other components within normal limits  URINALYSIS, ROUTINE W REFLEX MICROSCOPIC (NOT AT [facility]) - Abnormal; Notable for the following:    Color, Urine AMBER (*)    APPearance CLOUDY (*)    Hgb urine dipstick SMALL (*)    Bilirubin Urine SMALL (*)    Ketones, ur 15  (*)    Protein, ur 30 (*)    Leukocytes, UA MODERATE (*)    All other components within normal limits  URINE MICROSCOPIC-ADD ON - Abnormal; Notable for the following:    Squamous Epithelial / LPF TOO NUMEROUS TO COUNT (*)    Bacteria, UA FEW (*)    All other components within normal limits  LIPASE, BLOOD  CBC  I-STAT BETA HCG BLOOD, ED ([facility], [facility], [facility] ONLY)   Imaging Review No results found. I have personally reviewed and evaluated these images and lab results as part of my medical decision-making.   EKG Interpretation None      MDM  I have reviewed and evaluated the relevant laboratory values I have reviewed the relevant previous healthcare records.I obtained HPI from historian.  ED Course:  Assessment: Patient is a 30yF presents with abdominal pain with N/V/D since Monday. On exam, nontoxic, nonseptic appearing, in no apparent distress. Patient's pain and other symptoms adequately managed in emergency department.  Fluid bolus given.  Labs and vitals reviewed. Lipase neg. Patient does not meet the SIRS or Sepsis criteria.  On repeat exam patient does not have a surgical abdomen and there are no peritoneal signs.  No indication of appendicitis, bowel obstruction, bowel perforation, cholecystitis, diverticulitis, PID or ectopic pregnancy. Likely viral gastroenteritis. Symptoms are improving from Monday. Patient discharged home with symptomatic treatment and given strict instructions for follow-up with their primary care physician.  I have also discussed reasons to return immediately to the ER.  Patient expresses understanding and agrees with plan.  Disposition/Plan:  DC Home Additional Verbal discharge instructions given and discussed with patient.  Pt Instructed to f/u with PCP in the next week for evaluation and treatment of symptoms. Return precautions given Pt acknowledges and agrees with plan  Supervising Physician Melene Planan Floyd, DO   Final diagnoses:  Viral gastroenteritis     Audry Piliyler Digby Groeneveld, PA-C 10/26/15 1159  Melene Planan Floyd, DO 10/26/15 1229

## 2015-11-03 ENCOUNTER — Encounter (HOSPITAL_COMMUNITY): Payer: Self-pay

## 2015-11-03 ENCOUNTER — Emergency Department (HOSPITAL_COMMUNITY)
Admission: EM | Admit: 2015-11-03 | Discharge: 2015-11-03 | Disposition: A | Discharge status: Home / Self Care | Payer: Self-pay | Attending: Emergency Medicine | Admitting: Emergency Medicine

## 2015-11-03 DIAGNOSIS — R1010 Upper abdominal pain, unspecified: Secondary | ICD-10-CM

## 2015-11-03 DIAGNOSIS — Z79899 Other long term (current) drug therapy: Secondary | ICD-10-CM | POA: Insufficient documentation

## 2015-11-03 DIAGNOSIS — F17210 Nicotine dependence, cigarettes, uncomplicated: Secondary | ICD-10-CM | POA: Insufficient documentation

## 2015-11-03 LAB — LIPASE, BLOOD: Lipase: 12 U/L (ref 11–51)

## 2015-11-03 LAB — CBC
HCT: 39.9 % (ref 36.0–46.0)
HEMOGLOBIN: 13.3 g/dL (ref 12.0–15.0)
MCH: 30.9 pg (ref 26.0–34.0)
MCHC: 33.3 g/dL (ref 30.0–36.0)
MCV: 92.8 fL (ref 78.0–100.0)
PLATELETS: 267 10*3/uL (ref 150–400)
RBC: 4.3 MIL/uL (ref 3.87–5.11)
RDW: 13.1 % (ref 11.5–15.5)
WBC: 6.3 10*3/uL (ref 4.0–10.5)

## 2015-11-03 LAB — COMPREHENSIVE METABOLIC PANEL
ALK PHOS: 45 U/L (ref 38–126)
ALT: 12 U/L — ABNORMAL LOW (ref 14–54)
ANION GAP: 7 (ref 5–15)
AST: 15 U/L (ref 15–41)
Albumin: 3.5 g/dL (ref 3.5–5.0)
BILIRUBIN TOTAL: 0.6 mg/dL (ref 0.3–1.2)
BUN: <5 mg/dL — ABNORMAL LOW (ref 6–20)
CALCIUM: 8.6 mg/dL — AB (ref 8.9–10.3)
CO2: 27 mmol/L (ref 22–32)
Chloride: 104 mmol/L (ref 101–111)
Creatinine, Ser: 0.73 mg/dL (ref 0.44–1.00)
GFR calc non Af Amer: >60 mL/min (ref >60)
GLUCOSE: 93 mg/dL (ref 65–99)
Potassium: 3.6 mmol/L (ref 3.5–5.1)
Sodium: 138 mmol/L (ref 135–145)
TOTAL PROTEIN: 6.1 g/dL — AB (ref 6.5–8.1)

## 2015-11-03 LAB — I-STAT BETA HCG BLOOD, ED (MC, WL, AP ONLY)

## 2015-11-03 LAB — URINALYSIS, ROUTINE W REFLEX MICROSCOPIC
BILIRUBIN URINE: NEGATIVE
Glucose, UA: NEGATIVE mg/dL
Hgb urine dipstick: NEGATIVE
KETONES UR: NEGATIVE mg/dL
Leukocytes, UA: NEGATIVE
NITRITE: NEGATIVE
PROTEIN: NEGATIVE mg/dL
SPECIFIC GRAVITY, URINE: 1.018 (ref 1.005–1.030)
pH: 7.5 (ref 5.0–8.0)

## 2015-11-03 MED ORDER — POLYETHYLENE GLYCOL 3350 17 G PO PACK: 17.0000 g | PACK | Freq: Every day | ORAL | Status: DC

## 2015-11-03 MED ORDER — ACETAMINOPHEN 325 MG PO TABS
650.0000 mg | ORAL_TABLET | Freq: Once | ORAL | Status: AC
  Administered 2015-11-03: 650 mg via ORAL
  Filled 2015-11-03: qty 2

## 2015-11-03 MED ORDER — PANTOPRAZOLE SODIUM 20 MG PO TBEC: 20.0000 mg | DELAYED_RELEASE_TABLET | Freq: Every day | ORAL | Status: DC

## 2015-11-03 NOTE — ED Notes (Signed)
Per pT, Pt started to have upper abdominal pain yesterday. Denies N/V/D or vaginal discharge. Reports frequent urination.

## 2015-11-03 NOTE — ED Notes (Signed)
Provider at the bedside.  

## 2015-11-03 NOTE — ED Notes (Signed)
MD Sampson GoonFitzgerald at the bedside

## 2015-11-03 NOTE — Discharge Instructions (Signed)
Ms. Ann Moran,  I suspect your abdominal pain may be due to reflux since eating makes it worse. I have prescribed a medication called protonix to try. I recommend finding a primary care doctor to continue to follow your abdominal pain symptoms. You may also try miralax for possible constipation. Take to have a daily bowel movement. Be sure to drink plenty of water if taking miralax.

## 2015-11-03 NOTE — ED Notes (Signed)
MD made aware of patient's pain.  

## 2015-11-03 NOTE — ED Provider Notes (Signed)
CSN: [id number]     Arrival date & time 11/03/15  4098 History   First MD Initiated Contact with Patient 11/03/15 [phone]     Chief Complaint  Patient presents with  . Abdominal Pain   (Consider location/radiation/quality/duration/timing/severity/associated sxs/prior Treatment) HPI  Ann Moran is a 30-y/o female who presents with upper abdominal pain. Symptoms started yesterday. She reports intermittent crampy pain. Pain improved by tylenol and ibuprofen. Pain wraps around her sides but does not radiate to back. Food makes pain worse. Last drank alcohol, about 3 cups of liquor, over the weekend. Had a bowel movement today and had to strain somewhat; does not regularly have a BM daily. She denies increased vaginal discharge or dysuria but has had increased urinary frequency. She also reports irregular periods, with no period since April with sexual activity last about 2 weeks ago. She reports history of ectopic pregnancy. She denies n/v/d, fevers or chills.   Past Medical History  Diagnosis Date  . Pregnancy induced hypertension   . Urinary tract infection   . Depression     hx of meds, none currently- doing ok  . MRSA (methicillin resistant staph aureus) culture positive     Dec 2012   Past Surgical History  Procedure Laterality Date  . Ectopic pregnancy surgery    . Cholecystectomy     Family History  Problem Relation Age of Onset  . Hypertension Mother   . Diabetes Mother   . Asthma Father   . Hypertension Father   . Stroke Maternal Grandmother   . Diabetes Paternal Grandmother   . Hypertension Paternal Grandmother    Social History  Substance Use Topics  . Smoking status: Current Every Day Smoker -- 0.50 packs/day for 9 years    Types: Cigarettes  . Smokeless tobacco: Never Used  . Alcohol Use: Yes     Comment: occasional   OB History    Gravida Para Term Preterm AB TAB SAB Ectopic Multiple Living   2 1 0 Review of Systems  Constitutional: Negative  for fever and chills.  Respiratory: Negative for cough and shortness of breath.   Cardiovascular: Negative for chest pain.  Gastrointestinal: Positive for abdominal pain and constipation. Negative for nausea, vomiting and diarrhea.  Genitourinary: Positive for frequency.  Musculoskeletal: Negative for myalgias.  Skin: Negative for rash.  Neurological: Negative for headaches.   Allergies  Review of patient's allergies indicates no known allergies.  Home Medications   Prior to Admission medications   Medication Sig Start Date End Date Taking? Authorizing Provider  diphenhydrAMINE (BENADRYL) 25 mg capsule Take 75 mg by mouth every 6 (six) hours as needed for allergies.   Yes Historical Provider, MD  pantoprazole (PROTONIX) 20 MG tablet Take 1 tablet (20 mg total) by mouth daily. 11/03/15   Hillary Percell Boston, MD  polyethylene glycol [facility]) packet Take 17 g by mouth daily. 11/03/15   Hillary Percell Boston, MD   BP 111/67 mmHg  Pulse 63  Temp(Src) 98.2 F (36.8 C) (Oral)  Resp 16  SpO2 97%  LMP 08/05/2015 (Approximate) Physical Exam  Constitutional: She is oriented to person, place, and time. She appears well-developed and well-nourished. No distress.  HENT:  Head: Normocephalic and atraumatic.  Nose: Nose normal.  Mouth/Throat: Oropharynx is clear and moist.  Eyes: Conjunctivae and EOM are normal. Pupils are equal, round, and reactive to light.  Neck: Normal range of motion. Neck supple.  Cardiovascular:  Normal rate, regular rhythm, normal heart sounds and intact distal pulses.   No murmur heard. Pulmonary/Chest: Effort normal and breath sounds normal. No respiratory distress. She has no wheezes.  Abdominal: Soft. Bowel sounds are normal. She exhibits no distension. There is tenderness (Mildly TTP over upper abdomen, especially epigastric region). There is no rebound and no guarding.  Musculoskeletal: Normal range of motion. She exhibits no edema.  Neurological:  She is alert and oriented to person, place, and time.  Skin: Skin is warm and dry. She is not diaphoretic.  Psychiatric:  Somewhat flat affect.   Nursing note and vitals reviewed.   ED Course  Procedures (including critical care time) Labs Review Labs Reviewed  COMPREHENSIVE METABOLIC PANEL - Abnormal; Notable for the following:    BUN <5 (*)    Calcium 8.6 (*)    Total Protein 6.1 (*)    ALT 12 (*)    All other components within normal limits  LIPASE, BLOOD  CBC  URINALYSIS, ROUTINE W REFLEX MICROSCOPIC (NOT AT [facility])  I-STAT BETA HCG BLOOD, ED ([facility], [facility], [facility] ONLY)    Imaging Review No results found. I have personally reviewed and evaluated these images and lab results as part of my medical decision-making.   EKG Interpretation None      MDM   Final diagnoses:  Pain of upper abdomen   Pt presents with crampy abdominal pain x 1 day. Benign abdominal exam, with mild TTP over epigastric region. Beta-hcg negative, and UA without evidence of infection (complaint of increased frequency). Lipase WNL. Pain improved with tylenol; recommended continued prn use. Recommended trial of protonix, as GERD could be contributing to pain, and establishing with a primary care doctor to follow-up symptoms. Suggested miralax and increasing water intake to promote more regular bowel movements.  Dani GobbleHillary Fitzgerald, MD [facility], PGY-2    [facility] Moen Fitzgerald, MD 11/03/15 2152  Rolland PorterMark James, MD 11/12/15 0040

## 2015-11-14 ENCOUNTER — Telehealth: Payer: Self-pay | Admitting: *Deleted

## 2015-11-18 ENCOUNTER — Telehealth (HOSPITAL_BASED_OUTPATIENT_CLINIC_OR_DEPARTMENT_OTHER): Payer: Self-pay

## 2015-12-19 ENCOUNTER — Emergency Department (HOSPITAL_COMMUNITY)
Admission: EM | Admit: 2015-12-19 | Discharge: 2015-12-19 | Disposition: A | Discharge status: Home / Self Care | Payer: Self-pay

## 2015-12-19 NOTE — ED Notes (Signed)
Called for triage, no response.

## 2015-12-21 ENCOUNTER — Emergency Department (HOSPITAL_COMMUNITY): Admission: EM | Admit: 2015-12-21 | Discharge: 2015-12-21 | Discharge status: Absent without leave | Payer: Self-pay

## 2015-12-21 NOTE — ED Notes (Signed)
Pt called x2 to be triaged with no answer.

## 2015-12-21 NOTE — ED Notes (Signed)
Unable to locate pt x 3

## 2015-12-21 NOTE — ED Notes (Signed)
Unable to locate patient x1

## 2015-12-21 NOTE — ED Notes (Signed)
Unable to locate pt  

## 2016-01-01 ENCOUNTER — Emergency Department (HOSPITAL_COMMUNITY): Payer: Self-pay

## 2016-01-01 ENCOUNTER — Encounter (HOSPITAL_COMMUNITY): Payer: Self-pay | Admitting: Emergency Medicine

## 2016-01-01 DIAGNOSIS — Y929 Unspecified place or not applicable: Secondary | ICD-10-CM | POA: Insufficient documentation

## 2016-01-01 DIAGNOSIS — M546 Pain in thoracic spine: Secondary | ICD-10-CM | POA: Insufficient documentation

## 2016-01-01 DIAGNOSIS — F17210 Nicotine dependence, cigarettes, uncomplicated: Secondary | ICD-10-CM | POA: Insufficient documentation

## 2016-01-01 DIAGNOSIS — M542 Cervicalgia: Secondary | ICD-10-CM | POA: Insufficient documentation

## 2016-01-01 DIAGNOSIS — Y999 Unspecified external cause status: Secondary | ICD-10-CM | POA: Insufficient documentation

## 2016-01-01 DIAGNOSIS — Y939 Activity, unspecified: Secondary | ICD-10-CM | POA: Insufficient documentation

## 2016-01-01 DIAGNOSIS — R05 Cough: Secondary | ICD-10-CM | POA: Insufficient documentation

## 2016-01-01 LAB — POC URINE PREG, ED: Preg Test, Ur: NEGATIVE
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
## 2016-01-02 ENCOUNTER — Emergency Department (HOSPITAL_COMMUNITY)
Admission: EM | Admit: 2016-01-02 | Discharge: 2016-01-02 | Disposition: A | Discharge status: Home / Self Care | Payer: Self-pay | Attending: Emergency Medicine | Admitting: Emergency Medicine

## 2016-01-02 ENCOUNTER — Emergency Department (HOSPITAL_COMMUNITY): Payer: Self-pay

## 2016-01-02 DIAGNOSIS — R05 Cough: Secondary | ICD-10-CM

## 2016-01-02 DIAGNOSIS — R059 Cough, unspecified: Secondary | ICD-10-CM

## 2016-01-02 MED ORDER — CYCLOBENZAPRINE HCL 10 MG PO TABS
5.0000 mg | ORAL_TABLET | Freq: Once | ORAL | Status: AC
  Administered 2016-01-02: 5 mg via ORAL
  Filled 2016-01-02: qty 1

## 2016-01-02 MED ORDER — CYCLOBENZAPRINE HCL 10 MG PO TABS: 5.0000 mg | ORAL_TABLET | Freq: Two times a day (BID) | ORAL | 0 refills | Status: DC | PRN

## 2016-01-02 MED ORDER — BENZONATATE 100 MG PO CAPS: 100.0000 mg | ORAL_CAPSULE | Freq: Three times a day (TID) | ORAL | 0 refills | Status: DC

## 2016-01-02 MED ORDER — KETOROLAC TROMETHAMINE 60 MG/2ML IM SOLN
60.0000 mg | Freq: Once | INTRAMUSCULAR | Status: AC
  Administered 2016-01-02: 60 mg via INTRAMUSCULAR
  Filled 2016-01-02: qty 2
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
## 2016-01-02 NOTE — ED Notes (Signed)
Taken to xray at this time. 

## 2016-02-29 ENCOUNTER — Encounter (HOSPITAL_COMMUNITY): Payer: Self-pay | Admitting: Emergency Medicine

## 2016-02-29 ENCOUNTER — Emergency Department (HOSPITAL_COMMUNITY): Payer: Self-pay

## 2016-02-29 ENCOUNTER — Emergency Department (HOSPITAL_COMMUNITY)
Admission: EM | Admit: 2016-02-29 | Discharge: 2016-02-29 | Disposition: A | Discharge status: Home / Self Care | Payer: Self-pay | Attending: Emergency Medicine | Admitting: Emergency Medicine

## 2016-02-29 DIAGNOSIS — F17210 Nicotine dependence, cigarettes, uncomplicated: Secondary | ICD-10-CM | POA: Insufficient documentation

## 2016-02-29 DIAGNOSIS — Y9389 Activity, other specified: Secondary | ICD-10-CM | POA: Insufficient documentation

## 2016-02-29 DIAGNOSIS — X501XXA Overexertion from prolonged static or awkward postures, initial encounter: Secondary | ICD-10-CM | POA: Insufficient documentation

## 2016-02-29 DIAGNOSIS — Y92009 Unspecified place in unspecified non-institutional (private) residence as the place of occurrence of the external cause: Secondary | ICD-10-CM | POA: Insufficient documentation

## 2016-02-29 DIAGNOSIS — S93491A Sprain of other ligament of right ankle, initial encounter: Secondary | ICD-10-CM | POA: Insufficient documentation

## 2016-02-29 DIAGNOSIS — Y999 Unspecified external cause status: Secondary | ICD-10-CM | POA: Insufficient documentation

## 2016-02-29 HISTORY — DX: Obesity, unspecified: E66.9

## 2016-02-29 MED ORDER — NAPROXEN 250 MG PO TABS
500.0000 mg | ORAL_TABLET | Freq: Once | ORAL | Status: AC
  Administered 2016-02-29: 500 mg via ORAL
  Filled 2016-02-29: qty 2

## 2016-02-29 MED ORDER — NAPROXEN 500 MG PO TABS: 500.0000 mg | ORAL_TABLET | Freq: Two times a day (BID) | ORAL | 0 refills | Status: DC

## 2016-02-29 NOTE — ED Triage Notes (Signed)
Pt. felt a " pop" while walking at home this evening with pain and mild swelling at right ankle .

## 2016-02-29 NOTE — ED Notes (Signed)
Pt called x1 for Pod F.  No answer in sub-wait or waiting room.

## 2016-02-29 NOTE — ED Provider Notes (Signed)
[facility] DEPT Provider Note   CSN: [id number] Arrival date & time: 02/29/16  1943     History   Chief Complaint Chief Complaint  Patient presents with  . Ankle Injury    HPI Ann Moran is a 30 y.o. female presents to the emergency department after she had an inversion ankle sprain while ambulating at home. She states that she simply stepped wrong on ambulating on a flat floor. She did not fall or sustain any other injuries. She states that she felt a pop and then noted immediate swelling and pain. She has had an antalgic gait since but has been able to bear some weight on her lower extremity. She has not taken anything for her pain.   HPI  Past Medical History:  Diagnosis Date  . Depression    hx of meds, none currently- doing ok  . MRSA (methicillin resistant staph aureus) culture positive    Dec 2012  . Obesity   . Pregnancy induced hypertension   . Urinary tract infection     There are no active problems to display for this patient.   Past Surgical History:  Procedure Laterality Date  . CHOLECYSTECTOMY    . ECTOPIC PREGNANCY SURGERY      OB History    Gravida Para Term Preterm AB Living   2 1 0 1 1 1    SAB TAB Ectopic Multiple Live Births       1   1       Home Medications    Prior to Admission medications   Medication Sig Start Date End Date Taking? Authorizing Provider  acetaminophen (TYLENOL) 500 MG tablet Take 1,000 mg by mouth every 6 (six) hours as needed for moderate pain.    Historical Provider, MD  benzonatate (TESSALON) 100 MG capsule Take 1 capsule (100 mg total) by mouth every 8 (eight) hours. 01/02/16   Tiffany Neva SeatGreene, PA-C  cyclobenzaprine (FLEXERIL) 10 MG tablet Take 0.5-1 tablets (5-10 mg total) by mouth 2 (two) times daily as needed. 01/02/16   Tiffany Neva SeatGreene, PA-C  diphenhydrAMINE (BENADRYL) 25 mg capsule Take 75 mg by mouth every 6 (six) hours as needed for allergies.    Historical Provider, MD  naproxen (NAPROSYN) 500 MG tablet  Take 1 tablet (500 mg total) by mouth 2 (two) times daily. 02/29/16   Francoise CeoWarren S Zacory Fiola, DO  pantoprazole (PROTONIX) 20 MG tablet Take 1 tablet (20 mg total) by mouth daily. 11/03/15   Hillary Percell BostonMoen Fitzgerald, MD  polyethylene glycol [facility](MIRALAX / Ethelene HalGLYCOLAX) packet Take 17 g by mouth daily. Patient not taking: Reported on 01/02/2016 11/03/15   Casey BurkittHillary Moen Fitzgerald, MD    Family History Family History  Problem Relation Age of Onset  . Hypertension Mother   . Diabetes Mother   . Asthma Father   . Hypertension Father   . Stroke Maternal Grandmother   . Diabetes Paternal Grandmother   . Hypertension Paternal Grandmother     Social History Social History  Substance Use Topics  . Smoking status: Current Every Day Smoker    Packs/day: 0.50    Years: 9.00    Types: Cigarettes  . Smokeless tobacco: Never Used  . Alcohol use Yes     Comment: occasional     Allergies   Patient has no known allergies.   Review of Systems Review of Systems  Constitutional: Positive for activity change. Negative for chills and fever.  HENT: Negative for congestion, sneezing and sore throat.   Respiratory: Negative for  cough.   Cardiovascular: Negative for chest pain.  Gastrointestinal: Negative for abdominal pain, diarrhea, nausea and vomiting.  Genitourinary: Negative for dysuria, frequency and urgency.  Musculoskeletal: Positive for arthralgias (right ankle), gait problem and joint swelling. Negative for back pain, myalgias and neck pain.  Skin: Negative for rash and wound.  Neurological: Negative for syncope, weakness and numbness.  All other systems reviewed and are negative.    Physical Exam Updated Vital Signs BP 118/68 (BP Location: Right Arm)   Pulse 82   Temp 97.8 F (36.6 C) (Oral)   Resp 17   Ht 5\' 7"  (1.702 m)   Wt 121.6 kg   LMP 02/20/2016 (Approximate)   SpO2 100%   BMI 41.97 kg/m   Physical Exam  Constitutional: She is oriented to person, place, and time. She appears  well-developed and well-nourished. No distress.  HENT:  Head: Normocephalic and atraumatic.  Nose: Nose normal.  Mouth/Throat: Oropharynx is clear and moist.  Eyes: Conjunctivae and EOM are normal. Pupils are equal, round, and reactive to light.  Neck: Neck supple.  Cardiovascular: Normal rate, regular rhythm, normal heart sounds and intact distal pulses.   Pulmonary/Chest: Effort normal and breath sounds normal.  Abdominal: Soft. She exhibits no distension. There is no tenderness.  Musculoskeletal: She exhibits edema and tenderness. She exhibits no deformity.       Right ankle: She exhibits decreased range of motion, swelling and ecchymosis. She exhibits no deformity, no laceration and normal pulse. Tenderness. Lateral malleolus and AITFL tenderness found.       Feet:  Neurological: She is alert and oriented to person, place, and time. No cranial nerve deficit. Coordination normal.  Skin: Skin is warm and dry. No rash noted. She is not diaphoretic.  Nursing note and vitals reviewed.    ED Treatments / Results  Labs (all labs ordered are listed, but only abnormal results are displayed) Labs Reviewed - No data to display  EKG  EKG Interpretation None       Radiology Dg Ankle Complete Right  Result Date: 02/29/2016 CLINICAL DATA:  Twisting injury with pain and swelling EXAM: RIGHT ANKLE - COMPLETE 3+ VIEW COMPARISON:  None. FINDINGS: Possible tiny cortical avulsion of the lateral fibular malleolus tip. Moderate overlying soft tissue swelling. Ankle mortise is symmetric. Small plantar calcaneal spur. IMPRESSION: Possible tiny cortical avulsion lateral fibular malleolar tip. Electronically Signed   By: Jasmine Pang M.D.   On: 02/29/2016 20:23    Procedures Procedures (including critical care time)  Medications Ordered in ED Medications  naproxen (NAPROSYN) tablet 500 mg (500 mg Oral Given 02/29/16 2139)     Initial Impression / Assessment and Plan / ED Course  I have  reviewed the triage vital signs and the nursing notes.  Pertinent labs & imaging results that were available during my care of the patient were reviewed by me and considered in my medical decision making (see chart for details).  Clinical Course    30 year old female presents with inversion ankle sprain.  X-ray was done and showed possible tiny avulsion fracture at the distal fibula, associated with ankle sprain.  Given NSAIDS, air splint, and crutches. She was recommended RICE and NSAIDs for pain and activity as tolerated maintaining ROM of her ankle as tolerated. She was recommended to follow up with a PCP, as needed.    Final Clinical Impressions(s) / ED Diagnoses   Final diagnoses:  Sprain of anterior talofibular ligament of right ankle, initial encounter    New Prescriptions  Discharge Medication List as of 02/29/2016  9:35 PM    START taking these medications   Details  naproxen (NAPROSYN) 500 MG tablet Take 1 tablet (500 mg total) by mouth 2 (two) times daily., Starting Wed 02/29/2016, Print         Francoise CeoWarren S Breionna Punt, DO 03/01/16 1038    Rolland PorterMark James, MD 03/21/16 1009

## 2016-06-06 ENCOUNTER — Emergency Department (HOSPITAL_COMMUNITY)
Admission: EM | Admit: 2016-06-06 | Discharge: 2016-06-06 | Disposition: A | Discharge status: Home / Self Care | Payer: Self-pay | Attending: Emergency Medicine | Admitting: Emergency Medicine

## 2016-06-06 ENCOUNTER — Emergency Department (HOSPITAL_COMMUNITY): Payer: Self-pay

## 2016-06-06 ENCOUNTER — Encounter (HOSPITAL_COMMUNITY): Payer: Self-pay | Admitting: *Deleted

## 2016-06-06 DIAGNOSIS — R0600 Dyspnea, unspecified: Secondary | ICD-10-CM

## 2016-06-06 DIAGNOSIS — R0602 Shortness of breath: Secondary | ICD-10-CM | POA: Insufficient documentation

## 2016-06-06 DIAGNOSIS — F17210 Nicotine dependence, cigarettes, uncomplicated: Secondary | ICD-10-CM | POA: Insufficient documentation

## 2016-06-06 NOTE — ED Provider Notes (Signed)
[facility] Provider Note   CSN: [id number] Arrival date & time: 06/06/16  1950     History   Chief Complaint Chief Complaint  Patient presents with  . Shortness of Breath    HPI Ann Moran is a 31 y.o. female.  Patient c/o sob for the past few days, only at night. States occasionally will awaken and feel sob.  No daytime and/or exertional dyspnea. No chest pain or discomfort. No orthopnea. Denies leg pain or swelling. No recent surgery, immobility, trauma or travel.  Occasional non prod cough. No sore throat or runny nose. No fever or chills. +smoker. Hx gerd.    The history is provided by the patient.  Shortness of Breath  Associated symptoms include cough. Pertinent negatives include no fever, no headaches, no sore throat, no neck pain, no chest pain, no abdominal pain, no rash and no leg swelling.    Past Medical History:  Diagnosis Date  . Depression    hx of meds, none currently- doing ok  . MRSA (methicillin resistant staph aureus) culture positive    Dec 2012  . Obesity   . Pregnancy induced hypertension   . Urinary tract infection     There are no active problems to display for this patient.   Past Surgical History:  Procedure Laterality Date  . CHOLECYSTECTOMY    . ECTOPIC PREGNANCY SURGERY      OB History    Gravida Para Term Preterm AB Living   2 1 0 1 1 1    SAB TAB Ectopic Multiple Live Births       1   1       Home Medications    Prior to Admission medications   Medication Sig Start Date End Date Taking? Authorizing Provider  acetaminophen (TYLENOL) 500 MG tablet Take 1,000 mg by mouth every 6 (six) hours as needed for moderate pain.    Historical Provider, MD  benzonatate (TESSALON) 100 MG capsule Take 1 capsule (100 mg total) by mouth every 8 (eight) hours. 01/02/16   Tiffany Neva SeatGreene, PA-C  cyclobenzaprine (FLEXERIL) 10 MG tablet Take 0.5-1 tablets (5-10 mg total) by mouth 2 (two) times daily as needed. 01/02/16   Tiffany Neva SeatGreene,  PA-C  diphenhydrAMINE (BENADRYL) 25 mg capsule Take 75 mg by mouth every 6 (six) hours as needed for allergies.    Historical Provider, MD  naproxen (NAPROSYN) 500 MG tablet Take 1 tablet (500 mg total) by mouth 2 (two) times daily. 02/29/16   Francoise CeoWarren S Jones, DO  pantoprazole (PROTONIX) 20 MG tablet Take 1 tablet (20 mg total) by mouth daily. 11/03/15   Hillary Percell BostonMoen Fitzgerald, MD  polyethylene glycol [facility](MIRALAX / Ethelene HalGLYCOLAX) packet Take 17 g by mouth daily. Patient not taking: Reported on 01/02/2016 11/03/15   Casey BurkittHillary Moen Fitzgerald, MD    Family History Family History  Problem Relation Age of Onset  . Hypertension Mother   . Diabetes Mother   . Asthma Father   . Hypertension Father   . Stroke Maternal Grandmother   . Diabetes Paternal Grandmother   . Hypertension Paternal Grandmother     Social History Social History  Substance Use Topics  . Smoking status: Current Every Day Smoker    Packs/day: 0.50    Years: 9.00    Types: Cigarettes  . Smokeless tobacco: Never Used  . Alcohol use Yes     Comment: occasional     Allergies   Patient has no known allergies.   Review of Systems Review  of Systems  Constitutional: Negative for fever.  HENT: Negative for sore throat.   Eyes: Negative for redness.  Respiratory: Positive for cough and shortness of breath.   Cardiovascular: Negative for chest pain and leg swelling.  Gastrointestinal: Negative for abdominal pain.  Genitourinary: Negative for flank pain.  Musculoskeletal: Negative for back pain and neck pain.  Skin: Negative for rash.  Neurological: Negative for headaches.  Hematological: Does not bruise/bleed easily.  Psychiatric/Behavioral: Negative for confusion.     Physical Exam Updated Vital Signs BP 127/83 (BP Location: Left Arm)   Pulse 88   Temp 98.2 F (36.8 C) (Oral)   Resp 16   Ht 5\' 9"  (1.753 m)   Wt 126.3 kg   LMP 05/06/2016   SpO2 96%   BMI 41.13 kg/m   Physical Exam  Constitutional: She appears  well-developed and well-nourished. No distress.  HENT:  Mouth/Throat: Oropharynx is clear and moist.  Eyes: Conjunctivae are normal. No scleral icterus.  Neck: Neck supple. No JVD present. No tracheal deviation present.  Cardiovascular: Normal rate, regular rhythm, normal heart sounds and intact distal pulses.  Exam reveals no gallop and no friction rub.   No murmur heard. Pulmonary/Chest: Effort normal and breath sounds normal. No respiratory distress.  Abdominal: Soft. Normal appearance and bowel sounds are normal. She exhibits no distension. There is no tenderness.  Musculoskeletal: She exhibits no edema or tenderness.  Neurological: She is alert.  Skin: Skin is warm and dry. No rash noted. She is not diaphoretic.  Psychiatric: She has a normal mood and affect.  Nursing note and vitals reviewed.    ED Treatments / Results  Labs (all labs ordered are listed, but only abnormal results are displayed) Labs Reviewed - No data to display  EKG  EKG Interpretation None       Radiology Dg Chest 2 View  Result Date: 06/06/2016 CLINICAL DATA:  Short of breath EXAM: CHEST  2 VIEW COMPARISON:  01/01/2016 FINDINGS: The heart size and mediastinal contours are within normal limits. Both lungs are clear. The visualized skeletal structures are unremarkable. IMPRESSION: No active cardiopulmonary disease. Electronically Signed   By: Marlan Palau M.D.   On: 06/06/2016 21:15    Procedures Procedures (including critical care time)  Medications Ordered in ED Medications - No data to display   Initial Impression / Assessment and Plan / ED Course  I have reviewed the triage vital signs and the nursing notes.  Pertinent labs & imaging results that were available during my care of the patient were reviewed by me and considered in my medical decision making (see chart for details).  Reviewed nursing notes and prior charts for additional history.   Chest xray.  Chest cta.   Patient  currently indicates symptoms resolved. Vitals normal. cxr neg.  Discussed diff, including possible gerd.   Patient currently appears stable for d/c.    Final Clinical Impressions(s) / ED Diagnoses   Final diagnoses:  None    New Prescriptions New Prescriptions   No medications on file     Cathren Laine, MD 06/06/16 2133

## 2016-06-06 NOTE — ED Notes (Signed)
Patient transported to X-ray 

## 2016-06-06 NOTE — ED Triage Notes (Signed)
The pt has been waking up at night for one week with sob  She has a hard time getting to sleep  .  At night during sleep is the only time she has sob  No difficulty breathing now   lmp  One week ago

## 2016-06-06 NOTE — Discharge Instructions (Signed)
It was our pleasure to provide your ER care today - we hope that you feel better.  You may try taking acid blocker medication, such as pepcid, and maalox, prior to going to bed.  Follow up with primary care doctor in the coming week.  Return to ER if worse, new symptoms, chest pain, worsening breathing, fevers, other concern.

## 2016-08-17 ENCOUNTER — Emergency Department (HOSPITAL_COMMUNITY)
Admission: EM | Admit: 2016-08-17 | Discharge: 2016-08-17 | Disposition: A | Discharge status: Home / Self Care | Payer: Self-pay | Attending: Emergency Medicine | Admitting: Emergency Medicine

## 2016-08-17 ENCOUNTER — Encounter (HOSPITAL_COMMUNITY): Payer: Self-pay | Admitting: *Deleted

## 2016-08-17 DIAGNOSIS — J069 Acute upper respiratory infection, unspecified: Secondary | ICD-10-CM | POA: Insufficient documentation

## 2016-08-17 DIAGNOSIS — B9789 Other viral agents as the cause of diseases classified elsewhere: Secondary | ICD-10-CM

## 2016-08-17 DIAGNOSIS — F17210 Nicotine dependence, cigarettes, uncomplicated: Secondary | ICD-10-CM | POA: Insufficient documentation

## 2016-08-17 DIAGNOSIS — Z79899 Other long term (current) drug therapy: Secondary | ICD-10-CM | POA: Insufficient documentation

## 2016-08-17 NOTE — ED Notes (Signed)
See provider assessment 

## 2016-08-17 NOTE — Discharge Instructions (Signed)
Please call [facility] to get established with a primary care provider. You can always return to the Emergency Department for re-evaluation if you develop new or worsening symptoms.

## 2016-08-17 NOTE — ED Provider Notes (Signed)
[facility] Provider Note   CSN: [id number] Arrival date & time: 08/17/16  1123   By signing my name below, I, Avnee Patel, attest that this documentation has been prepared under the direction and in the presence of  Shonette Rhames, PA-C. Electronically Signed: Clovis Pu, ED Scribe. 08/17/16. 12:23 PM.   History   Chief Complaint Chief Complaint  Patient presents with  . Cough    HPI Comments:  Ann Moran is a 31 y.o. female, with a PMHx of seasonal allergies, who presents to the Emergency Department complaining of a gradually improving, productive cough with yellow sputum x 1.5 weeks. Pt also reports sneezing, intermittent sore throat, congestion, rhinorrhea, subjective fever and chills. Pt notes recent sick contacts at home with similar symptoms. She has taken Theraflu with no relief and Mucinex with mild relief. Pt has also taken Benadryl for her symptoms. Pt denies ear pain, SOB, abdominal pain, back pain, nausea, vomiting, diarrhea or any other associated symptoms. Pt is a smoker, ~1 pack a day. No PCP per pt. No other complaints noted at this time.   The history is provided by the patient. No language interpreter was used.    Past Medical History:  Diagnosis Date  . Depression    hx of meds, none currently- doing ok  . MRSA (methicillin resistant staph aureus) culture positive    Dec 2012  . Obesity   . Pregnancy induced hypertension   . Urinary tract infection     There are no active problems to display for this patient.   Past Surgical History:  Procedure Laterality Date  . CHOLECYSTECTOMY    . ECTOPIC PREGNANCY SURGERY      OB History    Gravida Para Term Preterm AB Living   2 1 0 SAB TAB Ectopic Multiple Live Births       1   1       Home Medications    Prior to Admission medications   Medication Sig Start Date End Date Taking? Authorizing Provider  acetaminophen (TYLENOL) 500 MG tablet Take 1,000 mg by mouth every 6 (six) hours  as needed for moderate pain.    Historical Provider, MD  benzonatate (TESSALON) 100 MG capsule Take 1 capsule (100 mg total) by mouth every 8 (eight) hours. 01/02/16   Tiffany Neva Seat, PA-C  cyclobenzaprine (FLEXERIL) 10 MG tablet Take 0.5-1 tablets (5-10 mg total) by mouth 2 (two) times daily as needed. 01/02/16   Tiffany Neva Seat, PA-C  diphenhydrAMINE (BENADRYL) 25 mg capsule Take 75 mg by mouth every 6 (six) hours as needed for allergies.    Historical Provider, MD  naproxen (NAPROSYN) 500 MG tablet Take 1 tablet (500 mg total) by mouth 2 (two) times daily. 02/29/16   Francoise Ceo, DO  pantoprazole (PROTONIX) 20 MG tablet Take 1 tablet (20 mg total) by mouth daily. 11/03/15   Hillary Percell Boston, MD  polyethylene glycol [facility] / Ethelene Hal) packet Take 17 g by mouth daily. Patient not taking: Reported on 01/02/2016 11/03/15   Casey Burkitt, MD    Family History Family History  Problem Relation Age of Onset  . Hypertension Mother   . Diabetes Mother   . Asthma Father   . Hypertension Father   . Stroke Maternal Grandmother   . Diabetes Paternal Grandmother   . Hypertension Paternal Grandmother     Social History Social History  Substance Use Topics  . Smoking status: Current Every Day Smoker  Packs/day: 0.50    Years: 9.00    Types: Cigarettes  . Smokeless tobacco: Never Used  . Alcohol use Yes     Comment: occasional     Allergies   Patient has no known allergies.   Review of Systems Review of Systems  Constitutional: Positive for chills and fever (subjective). Negative for activity change.  HENT: Positive for congestion, sneezing and sore throat. Negative for ear pain.   Respiratory: Positive for cough. Negative for shortness of breath.   Cardiovascular: Negative for chest pain.  Gastrointestinal: Negative for abdominal pain, diarrhea, nausea and vomiting.  Musculoskeletal: Negative for back pain.  Skin: Negative for rash.  Allergic/Immunologic: Negative  for immunocompromised state.  Neurological: Negative for headaches.  Psychiatric/Behavioral: Negative for confusion.   Physical Exam Updated Vital Signs BP (!) 134/93 (BP Location: Left Arm)   Pulse 73   Temp 98.8 F (37.1 C) (Oral)   Resp 14   Ht  (1.753 m)   Wt 272 lb (123.4 kg)   LMP 06/18/2016   SpO2 98%   BMI 40.17 kg/m   Physical Exam  Constitutional: She appears well-developed and well-nourished. No distress.  HENT:  Head: Normocephalic and atraumatic.  Right Ear: Tympanic membrane normal.  Left Ear: Tympanic membrane normal.  Nose: Nose normal.  Mouth/Throat: Uvula is midline. Posterior oropharyngeal erythema (minimal) present.  Eyes: Conjunctivae are normal.  Neck: Neck supple.  Cardiovascular: Normal rate and regular rhythm.  Exam reveals no gallop and no friction rub.   No murmur heard. Pulmonary/Chest: Effort normal and breath sounds normal. No respiratory distress. She has no wheezes. She has no rales.  Abdominal: Soft. She exhibits no distension. There is no tenderness. There is no guarding.  Musculoskeletal: She exhibits no edema.  Neurological: She is alert.  Skin: Skin is warm and dry. No rash noted. She is not diaphoretic.  Psychiatric: Her behavior is normal.  Nursing note and vitals reviewed.  ED Treatments / Results  DIAGNOSTIC STUDIES:  Oxygen Saturation is 98% on RA, normal by my interpretation.    COORDINATION OF CARE:  12:21 PM Discussed treatment plan with pt at bedside and pt agreed to plan.  Labs (all labs ordered are listed, but only abnormal results are displayed) Labs Reviewed - No data to display  EKG  EKG Interpretation None       Radiology No results found.  Procedures Procedures (including critical care time)  Medications Ordered in ED Medications - No data to display   Initial Impression / Assessment and Plan / ED Course  I have reviewed the triage vital signs and the nursing notes.  Pertinent labs &  imaging results that were available during my care of the patient were reviewed by me and considered in my medical decision making (see chart for details).     Patients symptoms are consistent with URI, likely viral etiology. Discussed that antibiotics are not indicated for viral infections. Patient acknowledges understanding and reports she mostly needs a note from work. She reports marked improve since the initial onset of symptoms. Discussed return precautions. Pt is hemodynamically stable & in NAD prior to dc.  Final Clinical Impressions(s) / ED Diagnoses   Final diagnoses:  Viral URI with cough    New Prescriptions New Prescriptions   No medications on file  I personally performed the services described in this documentation, which was scribed in my presence. The recorded information has been reviewed and is accurate.     Zandrea Kenealy A Krystyne Tewksbury, PA-C  08/17/16 1824    Derwood Kaplan, MD 08/18/16 1233

## 2016-08-17 NOTE — ED Triage Notes (Signed)
Pt reports productive cough with yellow sputum onset x 1 wk, pt c/o sore throat due to coughing, denies other symptoms, needs doctors note to return to work, A&O x4

## 2016-09-22 ENCOUNTER — Inpatient Hospital Stay (HOSPITAL_COMMUNITY)
Admission: AD | Admit: 2016-09-22 | Discharge: 2016-09-22 | Disposition: A | Discharge status: Home / Self Care | Payer: Self-pay | Source: Ambulatory Visit | Attending: Obstetrics and Gynecology | Admitting: Obstetrics and Gynecology

## 2016-09-22 ENCOUNTER — Encounter (HOSPITAL_COMMUNITY): Payer: Self-pay | Admitting: *Deleted

## 2016-09-22 DIAGNOSIS — F329 Major depressive disorder, single episode, unspecified: Secondary | ICD-10-CM | POA: Insufficient documentation

## 2016-09-22 DIAGNOSIS — Z202 Contact with and (suspected) exposure to infections with a predominantly sexual mode of transmission: Secondary | ICD-10-CM | POA: Insufficient documentation

## 2016-09-22 DIAGNOSIS — Z79899 Other long term (current) drug therapy: Secondary | ICD-10-CM | POA: Insufficient documentation

## 2016-09-22 DIAGNOSIS — Z9049 Acquired absence of other specified parts of digestive tract: Secondary | ICD-10-CM | POA: Insufficient documentation

## 2016-09-22 DIAGNOSIS — F17210 Nicotine dependence, cigarettes, uncomplicated: Secondary | ICD-10-CM | POA: Insufficient documentation

## 2016-09-22 DIAGNOSIS — E669 Obesity, unspecified: Secondary | ICD-10-CM | POA: Insufficient documentation

## 2016-09-22 HISTORY — DX: Chlamydial infection, unspecified: A74.9

## 2016-09-22 HISTORY — DX: Trichomoniasis, unspecified: A59.9

## 2016-09-22 LAB — WET PREP, GENITAL
CLUE CELLS WET PREP: NONE SEEN
Sperm: NONE SEEN
Trich, Wet Prep: NONE SEEN
YEAST WET PREP: NONE SEEN

## 2016-09-22 LAB — RPR: RPR: NONREACTIVE

## 2016-09-22 LAB — URINALYSIS, ROUTINE W REFLEX MICROSCOPIC
Bilirubin Urine: NEGATIVE
Glucose, UA: NEGATIVE mg/dL
HGB URINE DIPSTICK: NEGATIVE
Ketones, ur: NEGATIVE mg/dL
Leukocytes, UA: NEGATIVE
Nitrite: NEGATIVE
Protein, ur: NEGATIVE mg/dL
SPECIFIC GRAVITY, URINE: 1.019 (ref 1.005–1.030)
pH: 5 (ref 5.0–8.0)

## 2016-09-22 LAB — HEPATITIS B SURFACE ANTIGEN: Hepatitis B Surface Ag: NEGATIVE

## 2016-09-22 LAB — POCT PREGNANCY, URINE: PREG TEST UR: NEGATIVE

## 2016-09-22 LAB — HIV ANTIBODY (ROUTINE TESTING W REFLEX): HIV Screen 4th Generation wRfx: NONREACTIVE

## 2016-09-22 MED ORDER — DIPHENHYDRAMINE HCL 25 MG PO CAPS: 50.0000 mg | ORAL_CAPSULE | Freq: Four times a day (QID) | ORAL | 0 refills | Status: DC | PRN

## 2016-09-22 NOTE — MAU Note (Signed)
I have not had a period in 3months. My partner has a std and I want to be checked for stds. Has done upt recently

## 2016-09-22 NOTE — Discharge Instructions (Signed)
Sexually Transmitted Disease  A sexually transmitted disease (STD) is a disease or infection that may be passed (transmitted) from person to person, usually during sexual activity. This may happen by way of saliva, semen, blood, vaginal mucus, or urine. Common STDs include:   Gonorrhea.   Chlamydia.   Syphilis.   HIV and AIDS.   Genital herpes.   Hepatitis B and C.   Trichomonas.   Human papillomavirus (HPV).   Pubic lice.   Scabies.   Mites.   Bacterial vaginosis.    What are the causes?  An STD may be caused by bacteria, a virus, or parasites. STDs are often transmitted during sexual activity if one person is infected. However, they may also be transmitted through nonsexual means. STDs may be transmitted after:   Sexual intercourse with an infected person.   Sharing sex toys with an infected person.   Sharing needles with an infected person or using unclean piercing or tattoo needles.   Having intimate contact with the genitals, mouth, or rectal areas of an infected person.   Exposure to infected fluids during birth.    What are the signs or symptoms?  Different STDs have different symptoms. Some people may not have any symptoms. If symptoms are present, they may include:   Painful or bloody urination.   Pain in the pelvis, abdomen, vagina, anus, throat, or eyes.   A skin rash, itching, or irritation.   Growths, ulcerations, blisters, or sores in the genital and anal areas.   Abnormal vaginal discharge with or without bad odor.   Penile discharge in men.   Fever.   Pain or bleeding during sexual intercourse.   Swollen glands in the groin area.   Yellow skin and eyes (jaundice). This is seen with hepatitis.   Swollen testicles.   Infertility.   Sores and blisters in the mouth.    How is this diagnosed?  To make a diagnosis, your health care provider may:   Take a medical history.   Perform a physical exam.   Take a sample of any discharge to examine.   Swab the throat, cervix,  opening to the penis, rectum, or vagina for testing.   Test a sample of your first morning urine.   Perform blood tests.   Perform a Pap test, if this applies.   Perform a colposcopy.   Perform a laparoscopy.    How is this treated?  Treatment depends on the STD. Some STDs may be treated but not cured.   Chlamydia, gonorrhea, trichomonas, and syphilis can be cured with antibiotic medicine.   Genital herpes, hepatitis, and HIV can be treated, but not cured, with prescribed medicines. The medicines lessen symptoms.   Genital warts from HPV can be treated with medicine or by freezing, burning (electrocautery), or surgery. Warts may come back.   HPV cannot be cured with medicine or surgery. However, abnormal areas may be removed from the cervix, vagina, or vulva.   If your diagnosis is confirmed, your recent sexual partners need treatment. This is true even if they are symptom-free or have a negative culture or evaluation. They should not have sex until their health care providers say it is okay.   Your health care provider may test you for infection again 3 months after treatment.    How is this prevented?  Take these steps to reduce your risk of getting an STD:   Use latex condoms, dental dams, and water-soluble lubricants during sexual activity. Do not use   petroleum jelly or oils.   Avoid having multiple sex partners.   Do not have sex with someone who has other sex partners.   Do not have sex with anyone you do not know or who is at high risk for an STD.   Avoid risky sex practices that can break your skin.   Do not have sex if you have open sores on your mouth or skin.   Avoid drinking too much alcohol or taking illegal drugs. Alcohol and drugs can affect your judgment and put you in a vulnerable position.   Avoid engaging in oral and anal sex acts.   Get vaccinated for HPV and hepatitis. If you have not received these vaccines in the past, talk to your health care provider about whether one or  both might be right for you.   If you are at risk of being infected with HIV, it is recommended that you take a prescription medicine daily to prevent HIV infection. This is called pre-exposure prophylaxis (PrEP). You are considered at risk if:  ? You are a man who has sex with other men (MSM).  ? You are a heterosexual man or woman and are sexually active with more than one partner.  ? You take drugs by injection.  ? You are sexually active with a partner who has HIV.   Talk with your health care provider about whether you are at high risk of being infected with HIV. If you choose to begin PrEP, you should first be tested for HIV. You should then be tested every 3 months for as long as you are taking PrEP.    Contact a health care provider if:   See your health care provider.   Tell your sexual partner(s). They should be tested and treated for any STDs.   Do not have sex until your health care provider says it is okay.  Get help right away if:  Contact your health care provider right away if:   You have severe abdominal pain.   You are a man and notice swelling or pain in your testicles.   You are a woman and notice swelling or pain in your vagina.    This information is not intended to replace advice given to you by your health care provider. Make sure you discuss any questions you have with your health care provider.  Document Released: 06/30/2002 Document Revised: 10/28/2015 Document Reviewed: 10/28/2012  Elsevier Interactive Patient Education  2018 [employer]

## 2016-09-22 NOTE — MAU Provider Note (Signed)
Chief Complaint: Possible Pregnancy and exposed to std   First Provider Initiated Contact with Patient 09/22/16 0212     SUBJECTIVE HPI: Ann Moran is a 31 y.o. [phone] female who presents to [facility]missions reporting partner testing positive for unknown STD and no menstrual period 3 months. Negative home UPT. Does not know what STD partner tested positive for and is not with him anymore. Requesting STD testing.  Associated signs and symptoms: Generalized abdominal cramping. Negative for fever, chills, vaginal discharge, vaginal bleeding, dyspareunia.  Past Medical History:  Diagnosis Date  . Chlamydia   . Depression    hx of meds, none currently- doing ok  . Infection due to trichomonas   . MRSA (methicillin resistant staph aureus) culture positive    Dec 2012  . Obesity   . Pregnancy induced hypertension   . Urinary tract infection    OB History  Gravida Para Term Preterm AB Living  2 1 0 1 1 1   SAB TAB Ectopic Multiple Live Births      1   1    # Outcome Date GA Lbr Len/2nd Weight Sex Delivery Anes PTL Lv  2 Preterm     F Vag-Spont EPI N LIV     Birth Comments: PIH  1 Ectopic              Past Surgical History:  Procedure Laterality Date  . CHOLECYSTECTOMY    . ECTOPIC PREGNANCY SURGERY     Social History   Social History  . Marital status: Single    Spouse name: N/A  . Number of children: N/A  . Years of education: N/A   Occupational History  . Not on file.   Social History Main Topics  . Smoking status: Current Every Day Smoker    Packs/day: 0.50    Years: 9.00    Types: Cigarettes  . Smokeless tobacco: Never Used  . Alcohol use Yes     Comment: occasional  . Drug use: No  . Sexual activity: Yes    Birth control/ protection: None   Other Topics Concern  . Not on file   Social History Narrative  . No narrative on file   Family History  Problem Relation Age of Onset  . Hypertension Mother   . Diabetes Mother   . Asthma Father   .  Hypertension Father   . Stroke Maternal Grandmother   . Diabetes Paternal Grandmother   . Hypertension Paternal Grandmother    No current facility-administered medications on file prior to encounter.    Current Outpatient Prescriptions on File Prior to Encounter  Medication Sig Dispense Refill  . acetaminophen (TYLENOL) 500 MG tablet Take 1,000 mg by mouth every 6 (six) hours as needed for moderate pain.    . benzonatate (TESSALON) 100 MG capsule Take 1 capsule (100 mg total) by mouth every 8 (eight) hours. 21 capsule 0  . cyclobenzaprine (FLEXERIL) 10 MG tablet Take 0.5-1 tablets (5-10 mg total) by mouth 2 (two) times daily as needed. 20 tablet 0  . naproxen (NAPROSYN) 500 MG tablet Take 1 tablet (500 mg total) by mouth 2 (two) times daily. 30 tablet 0  . pantoprazole (PROTONIX) 20 MG tablet Take 1 tablet (20 mg total) by mouth daily. 30 tablet 0  . polyethylene glycol (MIRALAX / GLYCOLAX) packet Take 17 g by mouth daily. (Patient not taking: Reported on 01/02/2016) 14 each 0   No Known Allergies  I have reviewed patient's Past Medical Hx, Surgical  Hx, Family Hx, Social Hx, medications and allergies.   Review of Systems  Constitutional: Negative for chills and fever.  Gastrointestinal: Positive for abdominal pain (mild, generalized cramping).  Genitourinary: Negative for genital sores, pelvic pain, vaginal bleeding and vaginal discharge.    OBJECTIVE Patient Vitals for the past 24 hrs:  BP Temp Pulse Resp Height Weight  09/22/16 0255 130/81 98.5 F (36.9 C) 71 - - -  09/22/16 0132 117/90 - 74 16 5\' 9"  (1.753 m) 268 lb (121.6 kg)   Constitutional: Well-developed, well-nourished female in no acute distress.  Cardiovascular: normal rate Respiratory: normal rate and effort.  GI: Abd soft, non-tender. Pos BS x 4 Neurologic: Alert and oriented x 4.  GU:  SPECULUM EXAM: NEFG, physiologic discharge, no blood noted, cervix clean  BIMANUAL: cervix closed; uterus normal size, no adnexal  tenderness or masses. No CMT.  LAB RESULTS Results for orders placed or performed during the hospital encounter of 09/22/16 (from the past 24 hour(s))  Urinalysis, Routine w reflex microscopic     Status: Abnormal   Collection Time: 09/22/16  1:40 AM  Result Value Ref Range   Color, Urine YELLOW YELLOW   APPearance HAZY (A) CLEAR   Specific Gravity, Urine 1.019 1.005 - 1.030   pH 5.0 5.0 - 8.0   Glucose, UA NEGATIVE NEGATIVE mg/dL   Hgb urine dipstick NEGATIVE NEGATIVE   Bilirubin Urine NEGATIVE NEGATIVE   Ketones, ur NEGATIVE NEGATIVE mg/dL   Protein, ur NEGATIVE NEGATIVE mg/dL   Nitrite NEGATIVE NEGATIVE   Leukocytes, UA NEGATIVE NEGATIVE  Pregnancy, urine POC     Status: None   Collection Time: 09/22/16  2:02 AM  Result Value Ref Range   Preg Test, Ur NEGATIVE NEGATIVE  Wet prep, genital     Status: Abnormal   Collection Time: 09/22/16  2:15 AM  Result Value Ref Range   Yeast Wet Prep HPF POC NONE SEEN NONE SEEN   Trich, Wet Prep NONE SEEN NONE SEEN   Clue Cells Wet Prep HPF POC NONE SEEN NONE SEEN   WBC, Wet Prep HPF POC FEW (A) NONE SEEN   Sperm NONE SEEN     IMAGING No results found.  MAU COURSE Orders Placed This Encounter  Procedures  . Wet prep, genital  . Urinalysis, Routine w reflex microscopic  . HIV antibody (routine testing) (NOT for [facility])  . RPR  . Hepatitis B surface antigen  . Pregnancy, urine POC  . Discharge patient    MDM - Exposure to unknown STD w/ Nml exam. Will await results and TX PRN.   ASSESSMENT 1. STD exposure     PLAN Discharge home in stable condition. STD precautions. Recommend condoms. Follow-up Information    Gynecologist of your choice Follow up.   Why:  Gynecology care         Allergies as of 09/22/2016   No Known Allergies     Medication List    STOP taking these medications   acetaminophen 500 MG tablet Commonly known as:  TYLENOL   benzonatate 100 MG capsule Commonly known as:  TESSALON    cyclobenzaprine 10 MG tablet Commonly known as:  FLEXERIL   pantoprazole 20 MG tablet Commonly known as:  PROTONIX   polyethylene glycol packet Commonly known as:  MIRALAX / GLYCOLAX     TAKE these medications   diphenhydrAMINE 25 mg capsule Commonly known as:  BENADRYL Take 2 capsules (50 mg total) by mouth every 6 (six) hours as needed for allergies.  What changed:  how much to take     ASK your doctor about these medications   naproxen 500 MG tablet Commonly known as:  NAPROSYN Take 1 tablet (500 mg total) by mouth 2 (two) times daily.         Katrinka Blazing, [place], CNM 09/22/2016  3:04 AM

## 2016-09-22 NOTE — Progress Notes (Signed)
Written and verbal d/c instructions given and understanding voiced. 

## 2016-09-24 ENCOUNTER — Telehealth: Payer: Self-pay | Admitting: Obstetrics and Gynecology

## 2016-09-24 LAB — GC/CHLAMYDIA PROBE AMP (~~LOC~~) NOT AT ARMC
Chlamydia: NEGATIVE
NEISSERIA GONORRHEA: NEGATIVE

## 2016-09-24 NOTE — Telephone Encounter (Signed)
Patient calling to get test results.

## 2016-09-28 ENCOUNTER — Telehealth (HOSPITAL_COMMUNITY): Payer: Self-pay

## 2016-09-28 NOTE — Telephone Encounter (Signed)
Pt. Called for STI results. I verified pt. Name, DOB.

## 2016-10-02 NOTE — Telephone Encounter (Signed)
According to notes in patient chart patient called the hospital for her results. Results were given to patient.

## 2016-11-28 ENCOUNTER — Encounter (HOSPITAL_COMMUNITY): Payer: Self-pay | Admitting: *Deleted

## 2016-11-28 ENCOUNTER — Emergency Department (HOSPITAL_COMMUNITY): Payer: Self-pay

## 2016-11-28 ENCOUNTER — Emergency Department (HOSPITAL_COMMUNITY)
Admission: EM | Admit: 2016-11-28 | Discharge: 2016-11-28 | Disposition: A | Discharge status: Home / Self Care | Payer: Self-pay | Attending: Emergency Medicine | Admitting: Emergency Medicine

## 2016-11-28 DIAGNOSIS — Y999 Unspecified external cause status: Secondary | ICD-10-CM | POA: Insufficient documentation

## 2016-11-28 DIAGNOSIS — M79644 Pain in right finger(s): Secondary | ICD-10-CM | POA: Insufficient documentation

## 2016-11-28 DIAGNOSIS — Y9389 Activity, other specified: Secondary | ICD-10-CM | POA: Insufficient documentation

## 2016-11-28 DIAGNOSIS — F17210 Nicotine dependence, cigarettes, uncomplicated: Secondary | ICD-10-CM | POA: Insufficient documentation

## 2016-11-28 DIAGNOSIS — M25511 Pain in right shoulder: Secondary | ICD-10-CM | POA: Insufficient documentation

## 2016-11-28 DIAGNOSIS — Y929 Unspecified place or not applicable: Secondary | ICD-10-CM | POA: Insufficient documentation

## 2016-11-28 DIAGNOSIS — I10 Essential (primary) hypertension: Secondary | ICD-10-CM | POA: Insufficient documentation

## 2016-11-28 DIAGNOSIS — Z79899 Other long term (current) drug therapy: Secondary | ICD-10-CM | POA: Insufficient documentation

## 2016-11-28 MED ORDER — MELOXICAM 15 MG PO TABS: 15.0000 mg | ORAL_TABLET | Freq: Every day | ORAL | 0 refills | Status: DC | PRN

## 2016-11-28 NOTE — ED Notes (Signed)
Pt verbalizes understanding of d/c instructions.
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
## 2016-11-28 NOTE — Discharge Instructions (Signed)
Your Imaging shows no abnormalities. Follow up with an orthopedist or sports medicine doctor for your continued pain. Return to the ER if you lose use of your arm.
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
## 2016-12-17 ENCOUNTER — Encounter (HOSPITAL_COMMUNITY): Payer: Self-pay | Admitting: Emergency Medicine

## 2016-12-17 DIAGNOSIS — F17210 Nicotine dependence, cigarettes, uncomplicated: Secondary | ICD-10-CM | POA: Insufficient documentation

## 2016-12-17 DIAGNOSIS — Z9049 Acquired absence of other specified parts of digestive tract: Secondary | ICD-10-CM | POA: Insufficient documentation

## 2016-12-17 DIAGNOSIS — K115 Sialolithiasis: Secondary | ICD-10-CM | POA: Insufficient documentation

## 2016-12-17 DIAGNOSIS — J00 Acute nasopharyngitis [common cold]: Secondary | ICD-10-CM | POA: Insufficient documentation

## 2016-12-17 DIAGNOSIS — F329 Major depressive disorder, single episode, unspecified: Secondary | ICD-10-CM | POA: Insufficient documentation

## 2016-12-17 LAB — RAPID STREP SCREEN (MED CTR MEBANE ONLY): Streptococcus, Group A Screen (Direct): NEGATIVE

## 2016-12-17 NOTE — ED Triage Notes (Signed)
Pt having difficulty swallowing and sore throat since last night.

## 2016-12-18 ENCOUNTER — Emergency Department (HOSPITAL_COMMUNITY)
Admission: EM | Admit: 2016-12-18 | Discharge: 2016-12-18 | Disposition: A | Discharge status: Home / Self Care | Payer: Self-pay | Attending: Emergency Medicine | Admitting: Emergency Medicine

## 2016-12-18 DIAGNOSIS — J00 Acute nasopharyngitis [common cold]: Secondary | ICD-10-CM

## 2016-12-18 DIAGNOSIS — K115 Sialolithiasis: Secondary | ICD-10-CM

## 2016-12-18 MED ORDER — NAPROXEN 500 MG PO TABS: 500.0000 mg | ORAL_TABLET | Freq: Two times a day (BID) | ORAL | 0 refills | Status: DC

## 2016-12-18 MED ORDER — FLUTICASONE PROPIONATE 50 MCG/ACT NA SUSP: 2.0000 | Freq: Every day | NASAL | 0 refills | Status: DC

## 2016-12-18 NOTE — ED Notes (Signed)
PT states understanding of care given, follow up care, and medication prescribed. PT ambulated from ED to car with a steady gait. 

## 2016-12-18 NOTE — ED Notes (Signed)
Attempted to callx2 no response.

## 2016-12-18 NOTE — ED Provider Notes (Signed)
[facility]-EMERGENCY DEPT Provider Note   CSN: [id number] Arrival date & time: 12/17/16  2113     History   Chief Complaint Chief Complaint  Patient presents with  . Sore Throat    HPI Ann Moran is a 31 y.o. female.  Ann Moran is a 30 y.o. Female who presents the emergency department complaining of an area of painful swelling beneath the right side of her jaw since yesterday. Patient also reports associated symptoms of sneezing, nasal congestion, postnasal drip and runny nose ongoing for about a week. Patient reports since yesterday she's noticed a painful area of swelling that is painful with eating. 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 It is not more painful with chewing. She denies any sore throat or trouble swallowing. No fevers. No dental pain. She denies any discharge from her mouth or purulent discharge. No treatments attempted prior to arrival. She denies any pain in her neck or trouble moving her neck. No fevers, dental pain, vomiting, trouble swallowing, chest pain, ear pain, coughing or shortness of breath.   The history is provided by the patient and medical records. No language interpreter was used.  Sore Throat  Pertinent negatives include no headaches and no shortness of breath.    Past Medical History:  Diagnosis Date  . Chlamydia   . Depression    hx of meds, none currently- doing ok  . Infection due to trichomonas   . MRSA (methicillin resistant staph aureus) culture positive    Dec 2012  . Obesity   . Pregnancy induced hypertension   . Urinary tract infection     There are no active problems to display for this patient.   Past Surgical History:  Procedure Laterality Date  . CHOLECYSTECTOMY    . ECTOPIC PREGNANCY SURGERY      OB History    Gravida Para Term Preterm AB Living   2 1 0 1 1 1    SAB TAB Ectopic Multiple Live Births       1   1       Home Medications    Prior to Admission medications   Medication Sig  Start Date End Date Taking? Authorizing Provider  fluticasone (FLONASE) 50 MCG/ACT nasal spray Place 2 sprays into both nostrils daily. 12/18/16   Everlene Farrier, PA-C  naproxen (NAPROSYN) 500 MG tablet Take 1 tablet (500 mg total) by mouth 2 (two) times daily with a meal. 12/18/16   Everlene Farrier, PA-C    Family History Family History  Problem Relation Age of Onset  . Hypertension Mother   . Diabetes Mother   . Asthma Father   . Hypertension Father   . Stroke Maternal Grandmother   . Diabetes Paternal Grandmother   . Hypertension Paternal Grandmother     Social History Social History  Substance Use Topics  . Smoking status: Current Every Day Smoker    Packs/day: 0.50    Years: 9.00    Types: Cigarettes  . Smokeless tobacco: Never Used  . Alcohol use Yes     Comment: occasional     Allergies   Patient has no known allergies.   Review of Systems Review of Systems  Constitutional: Negative for chills and fever.  HENT: Positive for congestion, facial swelling, postnasal drip, rhinorrhea and sneezing. Negative for dental problem, ear discharge, ear pain, mouth sores, sore throat, trouble swallowing and voice change.   Eyes: Negative for visual disturbance.  Respiratory: Negative for cough and shortness of  breath.   Gastrointestinal: Negative for vomiting.  Musculoskeletal: Negative for neck pain and neck stiffness.  Skin: Negative for rash and wound.  Neurological: Negative for light-headedness and headaches.     Physical Exam Updated Vital Signs BP 130/87 (BP Location: Right Arm)   Pulse 93   Temp 99.9 F (37.7 C) (Oral)   Resp 16   Ht 5\' 9"  (1.753 m)   Wt 113.4 kg (250 lb)   SpO2 92%   BMI 36.92 kg/m   Physical Exam  Constitutional: She appears well-developed and well-nourished. No distress.  Nontoxic appearing.  HENT:  Head: Normocephalic and atraumatic.  Right Ear: External ear normal.  Left Ear: External ear normal.  Mild edema noted to her  submandibular gland. No dental pain noted. No obvious stones on visual examiner mouth. Her throat is clear. No tonsillar hypertrophy or exudates. Uvula is midline without edema. No peritonsillar abscess. No trismus. No drooling. No other facial swelling noted. No overlying skin changes to her face. Bilateral tympanic membranes are pearly-gray without erythema or loss of landmarks.  Rhinorrhea and boggy nasal turbinates noted bilaterally.  Eyes: Pupils are equal, round, and reactive to light. Conjunctivae are normal. Right eye exhibits no discharge. Left eye exhibits no discharge.  Neck: Normal range of motion. Neck supple. No JVD present.  Cardiovascular: Normal rate, regular rhythm and intact distal pulses.   Pulmonary/Chest: Effort normal. No stridor. No respiratory distress.  Lymphadenopathy:    She has no cervical adenopathy.  Neurological: She is alert. Coordination normal.  Skin: Skin is warm and dry. No rash noted. She is not diaphoretic. No erythema. No pallor.  Psychiatric: She has a normal mood and affect. Her behavior is normal.  Nursing note and vitals reviewed.    ED Treatments / Results  Labs (all labs ordered are listed, but only abnormal results are displayed) Labs Reviewed  RAPID STREP SCREEN (NOT AT [facility])  CULTURE, GROUP A STREP [facility])    EKG  EKG Interpretation None       Radiology No results found.  Procedures Procedures (including critical care time)  Medications Ordered in ED Medications - No data to display   Initial Impression / Assessment and Plan / ED Course  I have reviewed the triage vital signs and the nursing notes.  Pertinent labs & imaging results that were available during my care of the patient were reviewed by me and considered in my medical decision making (see chart for details).     This is a 31 y.o. Female who presents the emergency department complaining of an area of painful swelling beneath the right side of her jaw since  yesterday. Patient also reports associated symptoms of sneezing, nasal congestion, postnasal drip and runny nose ongoing for about a week. Patient reports since yesterday she's noticed a painful area of swelling that is painful with eating. 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 It is not more painful with chewing. She denies any sore throat or trouble swallowing. No fevers. No dental pain. She denies any discharge from her mouth or purulent discharge. On exam the patient is afebrile nontoxic appearing. Her throat is clear. No tonsillar hypertrophy or exudates. No peritonsillar abscess. No trismus. No drooling. She is mild edema noted to her right submandibular gland. No evidence of any visible stone on exam. She has no purulent discharge noted. She is afebrile. I suspect this is just salivary gland stone. No evidence of infection currently. I discussed methods to help with  a salivary gland stone and return precautions such as fever or purulent discharge. Her rapid strep screen was negative. She also has runny nose and postnasal drip for more than a week. Will start her on Flonase and naproxen. Return precautions discussed. I advised the patient to follow-up with their primary care provider this week. I advised the patient to return to the emergency department with new or worsening symptoms or new concerns. The patient verbalized understanding and agreement with plan.       Final Clinical Impressions(s) / ED Diagnoses   Final diagnoses:  Submandibular sialolithiasis  Acute nasopharyngitis    New Prescriptions New Prescriptions   FLUTICASONE (FLONASE) 50 MCG/ACT NASAL SPRAY    Place 2 sprays into both nostrils daily.   NAPROXEN (NAPROSYN) 500 MG TABLET    Take 1 tablet (500 mg total) by mouth 2 (two) times daily with a meal.     Everlene Farrier, PA-C 12/18/16 1610    Gilda Crease, MD 12/18/16 [phone]

## 2016-12-20 LAB — CULTURE, GROUP A STREP (THRC)

## 2017-04-08 ENCOUNTER — Other Ambulatory Visit: Payer: Self-pay

## 2017-04-08 ENCOUNTER — Encounter (HOSPITAL_COMMUNITY): Payer: Self-pay | Admitting: Neurology

## 2017-04-08 ENCOUNTER — Emergency Department (HOSPITAL_COMMUNITY)
Admission: EM | Admit: 2017-04-08 | Discharge: 2017-04-08 | Discharge status: Against Medical Advice | Payer: No Typology Code available for payment source | Attending: Emergency Medicine | Admitting: Emergency Medicine

## 2017-04-08 DIAGNOSIS — M791 Myalgia, unspecified site: Secondary | ICD-10-CM | POA: Insufficient documentation

## 2017-04-08 DIAGNOSIS — Z5321 Procedure and treatment not carried out due to patient leaving prior to being seen by health care provider: Secondary | ICD-10-CM | POA: Insufficient documentation

## 2017-04-08 NOTE — ED Notes (Signed)
Attempted to locate patient again with no response.
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
## 2017-04-08 NOTE — ED Notes (Signed)
Patient called x 2 with no answer 

## 2017-04-25 ENCOUNTER — Encounter (HOSPITAL_COMMUNITY): Payer: Self-pay | Admitting: Emergency Medicine

## 2017-04-25 ENCOUNTER — Emergency Department (HOSPITAL_COMMUNITY)
Admission: EM | Admit: 2017-04-25 | Discharge: 2017-04-25 | Disposition: A | Discharge status: Home / Self Care | Payer: Self-pay | Attending: Emergency Medicine | Admitting: Emergency Medicine

## 2017-04-25 DIAGNOSIS — Z79899 Other long term (current) drug therapy: Secondary | ICD-10-CM | POA: Insufficient documentation

## 2017-04-25 DIAGNOSIS — F17210 Nicotine dependence, cigarettes, uncomplicated: Secondary | ICD-10-CM | POA: Insufficient documentation

## 2017-04-25 DIAGNOSIS — R102 Pelvic and perineal pain: Secondary | ICD-10-CM | POA: Insufficient documentation

## 2017-04-25 DIAGNOSIS — N3000 Acute cystitis without hematuria: Secondary | ICD-10-CM | POA: Insufficient documentation

## 2017-04-25 DIAGNOSIS — R1030 Lower abdominal pain, unspecified: Secondary | ICD-10-CM | POA: Insufficient documentation

## 2017-04-25 LAB — URINALYSIS, ROUTINE W REFLEX MICROSCOPIC
BILIRUBIN URINE: NEGATIVE
Glucose, UA: NEGATIVE mg/dL
Hgb urine dipstick: NEGATIVE
Ketones, ur: NEGATIVE mg/dL
Nitrite: NEGATIVE
PROTEIN: NEGATIVE mg/dL
Specific Gravity, Urine: 1.015 (ref 1.005–1.030)
pH: 7 (ref 5.0–8.0)

## 2017-04-25 LAB — WET PREP, GENITAL
Clue Cells Wet Prep HPF POC: NONE SEEN
Sperm: NONE SEEN
TRICH WET PREP: NONE SEEN
YEAST WET PREP: NONE SEEN

## 2017-04-25 LAB — POC URINE PREG, ED: PREG TEST UR: NEGATIVE

## 2017-04-25 MED ORDER — NITROFURANTOIN MONOHYD MACRO 100 MG PO CAPS: 100.0000 mg | ORAL_CAPSULE | Freq: Two times a day (BID) | ORAL | 0 refills | Status: DC

## 2017-04-25 NOTE — ED Notes (Signed)
Bladder scan showed 160 mL. RN aware.

## 2017-04-25 NOTE — ED Notes (Signed)
Bed: [facility]1 Expected date:  Expected time:  Means of arrival:  Comments: 

## 2017-04-25 NOTE — Discharge Instructions (Signed)
Take marobid as prescribed for possible urinary tract infection. Drink plenty of fluids. Follow up as needed

## 2017-04-25 NOTE — ED Notes (Signed)
Bed: WTR5 Expected date:  Expected time:  Means of arrival:  Comments: 

## 2017-04-25 NOTE — ED Provider Notes (Signed)
[facility]-EMERGENCY DEPT Provider Note   CSN: [id number] Arrival date & time: 04/25/17  1028     History   Chief Complaint Chief Complaint  Patient presents with  . Urinary Retention    HPI Ann Moran is a 32 y.o. female.  HPI Ann Moran is a 32 y.o. female presents to emergency department complaining of suprapubic pain and dysuria.  Patient states symptoms started about a week ago.  She reports pressure when she tries to urinate and states is difficult for her to urinate.  She reports frequency and urgency.  She denies any hematuria.  No fever or chills.  No flank pain.  No nausea or vomiting.  She denies any vaginal discharge or bleeding.  She does not think she is pregnant.  She has tried drinking more water and cranberry juice with no relief.  No concern for an STD.  Past Medical History:  Diagnosis Date  . Chlamydia   . Depression    hx of meds, none currently- doing ok  . Infection due to trichomonas   . MRSA (methicillin resistant staph aureus) culture positive    Dec 2012  . Obesity   . Pregnancy induced hypertension   . Urinary tract infection     There are no active problems to display for this patient.   Past Surgical History:  Procedure Laterality Date  . CHOLECYSTECTOMY    . ECTOPIC PREGNANCY SURGERY      OB History    Gravida Para Term Preterm AB Living   2 1 0 1 1 1    SAB TAB Ectopic Multiple Live Births       1   1       Home Medications    Prior to Admission medications   Medication Sig Start Date End Date Taking? Authorizing Provider  fluticasone (FLONASE) 50 MCG/ACT nasal spray Place 2 sprays into both nostrils daily. 12/18/16   Everlene Farrieransie, William, PA-C  naproxen (NAPROSYN) 500 MG tablet Take 1 tablet (500 mg total) by mouth 2 (two) times daily with a meal. 12/18/16   Everlene Farrieransie, William, PA-C    Family History Family History  Problem Relation Age of Onset  . Hypertension Mother   . Diabetes Mother   . Asthma  Father   . Hypertension Father   . Stroke Maternal Grandmother   . Diabetes Paternal Grandmother   . Hypertension Paternal Grandmother     Social History Social History   Tobacco Use  . Smoking status: Current Every Day Smoker    Packs/day: 0.50    Years: 9.00    Pack years: 4.50    Types: Cigarettes  . Smokeless tobacco: Never Used  Substance Use Topics  . Alcohol use: Yes    Comment: occasional  . Drug use: No     Allergies   Patient has no known allergies.   Review of Systems Review of Systems  Constitutional: Negative for chills and fever.  Respiratory: Negative for cough, chest tightness and shortness of breath.   Cardiovascular: Negative for chest pain, palpitations and leg swelling.  Gastrointestinal: Positive for abdominal pain. Negative for diarrhea, nausea and vomiting.  Genitourinary: Positive for difficulty urinating, dysuria, frequency, pelvic pain and urgency. Negative for flank pain, vaginal bleeding, vaginal discharge and vaginal pain.  Musculoskeletal: Negative for arthralgias, myalgias, neck pain and neck stiffness.  Skin: Negative for rash.  Neurological: Negative for dizziness, weakness and headaches.  All other systems reviewed and are negative.    Physical  Exam Updated Vital Signs BP 139/88 (BP Location: Right Arm)   Pulse 83   Temp 98.9 F (37.2 C) (Oral)   Resp 16   Ht 5\' 9"  (1.753 m)   Wt 113.4 kg (250 lb)   LMP 01/23/2017   SpO2 99%   BMI 36.92 kg/m   Physical Exam  Constitutional: She appears well-developed and well-nourished. No distress.  HENT:  Head: Normocephalic.  Eyes: Conjunctivae are normal.  Neck: Neck supple.  Cardiovascular: Normal rate, regular rhythm and normal heart sounds.  Pulmonary/Chest: Effort normal and breath sounds normal. No respiratory distress. She has no wheezes. She has no rales.  Abdominal: Soft. Bowel sounds are normal. She exhibits no distension. There is tenderness. There is no rebound.    Suprapubic tenderness.  No CVA tenderness bilaterally  Genitourinary:  Genitourinary Comments: Normal external genitalia. Normal vaginal canal. Small thin white discharge. Cervix is normal, closed. No CMT. No uterine or adnexal tenderness. No masses palpated.    Musculoskeletal: She exhibits no edema.  Neurological: She is alert.  Skin: Skin is warm and dry.  Psychiatric: She has a normal mood and affect. Her behavior is normal.  Nursing note and vitals reviewed.    ED Treatments / Results  Labs (all labs ordered are listed, but only abnormal results are displayed) Labs Reviewed  WET PREP, GENITAL - Abnormal; Notable for the following components:      Result Value   WBC, Wet Prep HPF POC MODERATE (*)    All other components within normal limits  URINALYSIS, ROUTINE W REFLEX MICROSCOPIC - Abnormal; Notable for the following components:   APPearance HAZY (*)    Leukocytes, UA LARGE (*)    Bacteria, UA RARE (*)    Squamous Epithelial / LPF 6-30 (*)    All other components within normal limits  POC URINE PREG, ED  GC/CHLAMYDIA PROBE AMP ([place]) NOT AT [facility]    EKG  EKG Interpretation None       Radiology No results found.  Procedures Procedures (including critical care time)  Medications Ordered in ED Medications - No data to display   Initial Impression / Assessment and Plan / ED Course  I have reviewed the triage vital signs and the nursing notes.  Pertinent labs & imaging results that were available during my care of the patient were reviewed by me and considered in my medical decision making (see chart for details).     Patient in emergency department with dysuria.  Vital signs are normal.  Denies any vaginal complaints.  She is nontoxic-appearing.  Will check urinalysis and pregnancy test.  1:36 PM Urine sample contaminated, however is large leukocytes and some bacteria.  Pelvic exam unremarkable.  Postvoid residual obtained, 120 cc of urine.  Patient  is nontoxic-appearing.  Afebrile.  I will treat her with antibiotics for possible UTI given positive symptoms.  Gonorrhea and Chlamydia cultures are pending.  Will have a follow-up with family doctor as needed.  Vitals:   04/25/17 1046  BP: 139/88  Pulse: 83  Resp: 16  Temp: 98.9 F (37.2 C)  TempSrc: Oral  SpO2: 99%  Weight: 113.4 kg (250 lb)  Height: 5\' 9"  (1.753 m)     Final Clinical Impressions(s) / ED Diagnoses   Final diagnoses:  Acute cystitis without hematuria    ED Discharge Orders    None       Jaynie Crumble, PA-C 04/25/17 1511    Shaune Pollack, MD 04/26/17 [phone]

## 2017-04-25 NOTE — ED Triage Notes (Signed)
Patient reports that for past week when she urinates she waits til the very last minute to go and she has lots of pressure when tries to urinate. Denies burning with urination or blood in urine

## 2017-04-26 LAB — GC/CHLAMYDIA PROBE AMP (~~LOC~~) NOT AT ARMC
CHLAMYDIA, DNA PROBE: POSITIVE — AB
Neisseria Gonorrhea: NEGATIVE

## 2017-09-30 ENCOUNTER — Other Ambulatory Visit: Payer: Self-pay

## 2017-09-30 ENCOUNTER — Emergency Department (HOSPITAL_COMMUNITY)
Admission: EM | Admit: 2017-09-30 | Discharge: 2017-09-30 | Disposition: A | Discharge status: Home / Self Care | Payer: Self-pay | Attending: Emergency Medicine | Admitting: Emergency Medicine

## 2017-09-30 ENCOUNTER — Encounter (HOSPITAL_COMMUNITY): Payer: Self-pay

## 2017-09-30 DIAGNOSIS — B9689 Other specified bacterial agents as the cause of diseases classified elsewhere: Secondary | ICD-10-CM | POA: Insufficient documentation

## 2017-09-30 DIAGNOSIS — Z3202 Encounter for pregnancy test, result negative: Secondary | ICD-10-CM | POA: Insufficient documentation

## 2017-09-30 DIAGNOSIS — Z79899 Other long term (current) drug therapy: Secondary | ICD-10-CM | POA: Insufficient documentation

## 2017-09-30 DIAGNOSIS — N912 Amenorrhea, unspecified: Secondary | ICD-10-CM | POA: Insufficient documentation

## 2017-09-30 DIAGNOSIS — N390 Urinary tract infection, site not specified: Secondary | ICD-10-CM | POA: Insufficient documentation

## 2017-09-30 DIAGNOSIS — N760 Acute vaginitis: Secondary | ICD-10-CM | POA: Insufficient documentation

## 2017-09-30 DIAGNOSIS — F17210 Nicotine dependence, cigarettes, uncomplicated: Secondary | ICD-10-CM | POA: Insufficient documentation

## 2017-09-30 LAB — URINALYSIS, ROUTINE W REFLEX MICROSCOPIC
Bilirubin Urine: NEGATIVE
GLUCOSE, UA: NEGATIVE mg/dL
HGB URINE DIPSTICK: NEGATIVE
Ketones, ur: NEGATIVE mg/dL
NITRITE: NEGATIVE
PH: 7 (ref 5.0–8.0)
Protein, ur: NEGATIVE mg/dL
Specific Gravity, Urine: 1.018 (ref 1.005–1.030)

## 2017-09-30 LAB — WET PREP, GENITAL
Sperm: NONE SEEN
Trich, Wet Prep: NONE SEEN
Yeast Wet Prep HPF POC: NONE SEEN

## 2017-09-30 LAB — I-STAT BETA HCG BLOOD, ED (MC, WL, AP ONLY): I-stat hCG, quantitative: <5 m[IU]/mL (ref <5)

## 2017-09-30 MED ORDER — METRONIDAZOLE 500 MG PO TABS: 500.0000 mg | ORAL_TABLET | Freq: Two times a day (BID) | ORAL | 0 refills | Status: DC

## 2017-09-30 MED ORDER — CEPHALEXIN 500 MG PO CAPS: 500.0000 mg | ORAL_CAPSULE | Freq: Two times a day (BID) | ORAL | 0 refills | Status: DC

## 2017-09-30 NOTE — ED Notes (Signed)
Pt verbalized understanding discharge instructions and denies any further needs or questions at this time. VS stable, ambulatory and steady gait.   

## 2017-09-30 NOTE — Discharge Instructions (Addendum)
They will call you if any of your other results come back abnormal: don't drink etoh with the flagyl

## 2017-09-30 NOTE — ED Provider Notes (Signed)
[facility]   CSN: [id number] Arrival date & time: 09/30/17  1404     History   Chief Complaint Chief Complaint  Patient presents with  . Exposure to STD  . Possible Pregnancy    HPI Ann Moran is a 32 y.o. female.  Pt comes in with c/o mild lower abdominal pain and some pressure with urination. She has also been notified that her partner has multiple partners. Has had a sti before several years ago. Denies any vaginal discharge. States that she hasn't had period in over a year     Past Medical History:  Diagnosis Date  . Chlamydia   . Depression    hx of meds, none currently- doing ok  . Infection due to trichomonas   . MRSA (methicillin resistant staph aureus) culture positive    Dec 2012  . Obesity   . Pregnancy induced hypertension   . Urinary tract infection     There are no active problems to display for this patient.   Past Surgical History:  Procedure Laterality Date  . CHOLECYSTECTOMY    . ECTOPIC PREGNANCY SURGERY       OB History    Gravida  2   Para  1   Term  0   Preterm  1   AB  1   Living  1     SAB      TAB      Ectopic  1   Multiple      Live Births  1            Home Medications    Prior to Admission medications   Medication Sig Start Date End Date Taking? Authorizing Provider  diphenhydramine-acetaminophen (TYLENOL PM) 25-500 MG TABS tablet Take 1 tablet by mouth at bedtime as needed (sleep).    [provider]  nitrofurantoin, macrocrystal-monohydrate, (MACROBID) 100 MG capsule Take 1 capsule (100 mg total) by mouth 2 (two) times daily. 04/25/17   Jaynie CrumbleKirichenko, Tatyana, PA-C    Family History Family History  Problem Relation Age of Onset  . Hypertension Mother   . Diabetes Mother   . Asthma Father   . Hypertension Father   . Stroke Maternal Grandmother   . Diabetes Paternal Grandmother   . Hypertension Paternal Grandmother     Social  History Social History   Tobacco Use  . Smoking status: Current Every Day Smoker    Packs/day: 0.50    Years: 9.00    Pack years: 4.50    Types: Cigarettes  . Smokeless tobacco: Never Used  Substance Use Topics  . Alcohol use: Yes    Comment: occasional  . Drug use: No     Allergies   Patient has no known allergies.   Review of Systems Review of Systems  All other systems reviewed and are negative.    Physical Exam Updated Vital Signs Ht 5\' 9"  (1.753 m)   Wt 122.5 kg (270 lb)   BMI 39.87 kg/m   Physical Exam  Constitutional: She appears well-developed and well-nourished.  Neck: Normal range of motion. Neck supple.  Cardiovascular: Normal rate and regular rhythm.  Pulmonary/Chest: Effort normal and breath sounds normal.  Abdominal: Soft. Bowel sounds are normal.  Genitourinary:  Genitourinary Comments: Minimal white discharge. No cmt  Musculoskeletal: Normal range of motion.  Skin: Skin is warm and dry.  Psychiatric: She has a normal mood and affect.  Nursing note and vitals reviewed.  ED Treatments / Results  Labs (all labs ordered are listed, but only abnormal results are displayed) Labs Reviewed  RPR  HIV ANTIBODY (ROUTINE TESTING)  URINALYSIS, ROUTINE W REFLEX MICROSCOPIC  I-STAT BETA HCG BLOOD, ED ([facility], [facility], [facility] ONLY)  GC/CHLAMYDIA PROBE AMP ([place]) NOT AT [facility]    EKG None  Radiology No results found.  Procedures Procedures (including critical care time)  Medications Ordered in ED Medications - No data to display   Initial Impression / Assessment and Plan / ED Course  I have reviewed the triage vital signs and the nursing notes.  Pertinent labs & imaging results that were available during my care of the patient were reviewed by me and considered in my medical decision making (see chart for details).     Treated for bv and urine. sti cultures sent  Final Clinical Impressions(s) / ED Diagnoses   Final diagnoses:  None     ED Discharge Orders    None       Teressa Lower, NP 09/30/17 1713    Donnetta Hutching, MD 10/02/17 1037

## 2017-09-30 NOTE — ED Triage Notes (Signed)
Pt states that her partner just told her that they had multiple partners so she wants to get checked. Pt also states she has not had a period this year but reports that she has irregularities.

## 2017-10-01 LAB — HIV ANTIBODY (ROUTINE TESTING W REFLEX): HIV Screen 4th Generation wRfx: NONREACTIVE

## 2017-10-01 LAB — RPR: RPR Ser Ql: NONREACTIVE

## 2017-10-01 LAB — GC/CHLAMYDIA PROBE AMP (~~LOC~~) NOT AT ARMC
Chlamydia: POSITIVE — AB
Neisseria Gonorrhea: NEGATIVE

## 2017-10-04 ENCOUNTER — Telehealth: Payer: Self-pay | Admitting: Medical

## 2017-10-04 DIAGNOSIS — A749 Chlamydial infection, unspecified: Secondary | ICD-10-CM

## 2017-10-04 MED ORDER — AZITHROMYCIN 250 MG PO TABS: 1000.0000 mg | ORAL_TABLET | Freq: Once | ORAL | 0 refills | Status: AC

## 2017-10-04 NOTE — Telephone Encounter (Addendum)
Fransico HimMelanie E Clink tested positive for  Chlamydia. Patient was called by RN and allergies and pharmacy confirmed. Rx sent to pharmacy of choice.   Marny LowensteinWenzel, Julie N, PA-C 10/04/2017 10:14 AM     ----- Message from Kathe BectonLori S Berdik, RN sent at 10/04/2017  9:55 AM EDT ----- This patient tested positive for :  chlamydia  She ::"has NKDA", I have informed the patient of her results and confirmed her pharmacy is correct in her chart. Please send Rx.   Thank you,   Kathe BectonBerdik, Lori S, RN   [facility]

## 2017-11-14 ENCOUNTER — Encounter (HOSPITAL_COMMUNITY): Payer: Self-pay | Admitting: *Deleted

## 2017-11-14 ENCOUNTER — Emergency Department (HOSPITAL_COMMUNITY)
Admission: EM | Admit: 2017-11-14 | Discharge: 2017-11-14 | Disposition: A | Discharge status: Home / Self Care | Payer: Self-pay | Attending: Emergency Medicine | Admitting: Emergency Medicine

## 2017-11-14 ENCOUNTER — Emergency Department (HOSPITAL_COMMUNITY): Payer: Self-pay

## 2017-11-14 DIAGNOSIS — Z79899 Other long term (current) drug therapy: Secondary | ICD-10-CM | POA: Insufficient documentation

## 2017-11-14 DIAGNOSIS — M79645 Pain in left finger(s): Secondary | ICD-10-CM | POA: Insufficient documentation

## 2017-11-14 DIAGNOSIS — F17210 Nicotine dependence, cigarettes, uncomplicated: Secondary | ICD-10-CM | POA: Insufficient documentation

## 2017-11-14 NOTE — ED Notes (Signed)
Declined W/C at D/C and was escorted to lobby by RN. 

## 2017-11-14 NOTE — Discharge Instructions (Addendum)
Please follow-up with your primary care provider regarding your visit today. You may use over-the-counter anti-inflammatory medications such as ibuprofen for pain as directed on the bottle/package. Please return to the emergency department for any new or worsening symptoms. Your blood pressure was elevated today, please be sure to follow-up with your primary care provider regarding this as well.  Contact a doctor if: Your pain is not helped by medicine. Your bruising or swelling gets worse. You develop a fever, nausea, vomiting or diarrhea. Your finger is numb or blue. Your finger feels colder than normal

## 2017-11-14 NOTE — ED Triage Notes (Signed)
Pt in c/o injury to her left thumb, states she thinks she jammed it

## 2017-11-14 NOTE — ED Provider Notes (Signed)
[facility] EMERGENCY DEPARTMENT Provider Note   CSN: [id number] Arrival date & time: 11/14/17  1220     History   Chief Complaint Chief Complaint  Patient presents with  . Finger Injury    HPI Ann Moran is a 32 y.o. female presenting for 2 weeks of pain to her left thumb.  Patient states that she believes she jammed her thumb while moving around the house.  She states that the pain is a throbbing 4/10 in severity and is only present whenever he uses her thumb.  Patient states that the pain has gotten slightly better over the past 2 weeks but still hurts whenever she puts on her clothes or hits it.  Patient states that she has taken aspirin for her pain without relief.  Denies fever, nausea, vomiting, diarrhea,'s swelling to her joints, falls, other injuries. HPI  Past Medical History:  Diagnosis Date  . Chlamydia   . Depression    hx of meds, none currently- doing ok  . Infection due to trichomonas   . MRSA (methicillin resistant staph aureus) culture positive    Dec 2012  . Obesity   . Pregnancy induced hypertension   . Urinary tract infection     There are no active problems to display for this patient.   Past Surgical History:  Procedure Laterality Date  . CHOLECYSTECTOMY    . ECTOPIC PREGNANCY SURGERY       OB History    Gravida  2   Para  1   Term  0   Preterm  1   AB  1   Living  1     SAB      TAB      Ectopic  1   Multiple      Live Births  1            Home Medications    Prior to Admission medications   Medication Sig Start Date End Date Taking? Authorizing Provider  cephALEXin (KEFLEX) 500 MG capsule Take 1 capsule (500 mg total) by mouth 2 (two) times daily. 09/30/17   Teressa Lower, NP  diphenhydramine-acetaminophen (TYLENOL PM) 25-500 MG TABS tablet Take 1 tablet by mouth at bedtime as needed (sleep).    [provider]  metroNIDAZOLE (FLAGYL) 500 MG tablet Take 1 tablet (500 mg total)  by mouth 2 (two) times daily. 09/30/17   Teressa Lower, NP  nitrofurantoin, macrocrystal-monohydrate, (MACROBID) 100 MG capsule Take 1 capsule (100 mg total) by mouth 2 (two) times daily. 04/25/17   Jaynie Crumble, PA-C    Family History Family History  Problem Relation Age of Onset  . Hypertension Mother   . Diabetes Mother   . Asthma Father   . Hypertension Father   . Stroke Maternal Grandmother   . Diabetes Paternal Grandmother   . Hypertension Paternal Grandmother     Social History Social History   Tobacco Use  . Smoking status: Current Every Day Smoker    Packs/day: 0.50    Years: 9.00    Pack years: 4.50    Types: Cigarettes  . Smokeless tobacco: Never Used  Substance Use Topics  . Alcohol use: Yes    Comment: occasional  . Drug use: No     Allergies   Patient has no known allergies.   Review of Systems Review of Systems  Constitutional: Negative.  Negative for chills, fatigue and fever.  HENT: Negative.  Negative for sore throat and trouble swallowing.  Eyes: Negative.  Negative for visual disturbance.  Respiratory: Negative.  Negative for cough and shortness of breath.   Cardiovascular: Negative.  Negative for chest pain.  Gastrointestinal: Negative.  Negative for abdominal pain, diarrhea, nausea and vomiting.  Genitourinary: Negative.  Negative for dysuria and hematuria.  Musculoskeletal: Positive for arthralgias.  Skin: Negative.  Negative for color change, pallor, rash and wound.  Neurological: Negative.  Negative for dizziness, weakness, numbness and headaches.     Physical Exam Updated Vital Signs BP (!) 138/92 (BP Location: Right Arm)   Pulse 71   Temp 98.4 F (36.9 C) (Oral)   Resp 20   SpO2 100%   Physical Exam  Constitutional: She is oriented to person, place, and time. She appears well-developed and well-nourished. No distress.  HENT:  Head: Normocephalic and atraumatic.  Right Ear: External ear normal.  Left Ear: External  ear normal.  Eyes: Pupils are equal, round, and reactive to light. EOM are normal.  Neck: Normal range of motion. Neck supple. No JVD present. No tracheal deviation present.  Cardiovascular: Normal rate and regular rhythm.  Pulmonary/Chest: Effort normal and breath sounds normal. No respiratory distress.  Musculoskeletal: Normal range of motion. She exhibits no deformity.       Left hand: She exhibits tenderness. She exhibits normal range of motion, no bony tenderness, normal two-point discrimination, normal capillary refill, no deformity, no laceration and no swelling. Normal sensation noted. Normal strength noted.       Hands: Left hand: No gross deformities, skin intact. Fingers appear normal. No tenderness to palpation over flexor sheath.  No tenderness to palpation over the MCP joint of the thumb, patient denying pain at this time states it only comes on when she is using it. No snuffbox TTP. Finger adduction/abduction intact with 5/5 strength.  Thumb opposition intact. Full active and resisted ROM to flexion/extension at wrist, MCP, PIP and DIP of all fingers.   Radial artery 2+ with <2sec cap refill. Dynamic 2-pt discrimination intact over median and ulnar nerve distributions on the ulnar/radial aspects of digits. Grip 5/5 strength.    Neurological: She is alert and oriented to person, place, and time. No sensory deficit.  Skin: Skin is warm and dry.  Psychiatric: She has a normal mood and affect. Her behavior is normal.     ED Treatments / Results  Labs (all labs ordered are listed, but only abnormal results are displayed) Labs Reviewed - No data to display  EKG None  Radiology Dg Finger Thumb Left  Result Date: 11/14/2017 CLINICAL DATA:  Left thumb pain and tenderness.  No known injury. EXAM: LEFT THUMB 2+V COMPARISON:  No prior. FINDINGS: No acute bony or joint abnormality. No evidence of fracture or dislocation. No radiopaque foreign body. IMPRESSION: No acute abnormality.  Electronically Signed   By: Maisie Fushomas  Register   On: 11/14/2017 12:59    Procedures Procedures (including critical care time)  Medications Ordered in ED Medications - No data to display   Initial Impression / Assessment and Plan / ED Course  I have reviewed the triage vital signs and the nursing notes.  Pertinent labs & imaging results that were available during my care of the patient were reviewed by me and considered in my medical decision making (see chart for details).    Patient presenting with left thumb pain for the past 2 weeks.  Radiographs negative today.  I have informed the patient that she should use ibuprofen instead of aspirin for musculoskeletal pain.  Patient  denies history of kidney disease or gastric bleeding.  I have also encouraged RICE therapy for the patient's thumb.  I have encouraged the patient follow-up with her primary care provider for further evaluation.  Patient's pain is most likely musculoskeletal in nature, there is no swelling, increased warmth, erythema, skin break or drainage suggesting infection.  Patient is afebrile, no acute distress.  I informed the patient that her blood pressure was slightly elevated today and she is to follow-up with her primary care provider for further evaluation.  At this time there does not appear to be any evidence of an acute emergency medical condition and the patient appears stable for discharge with appropriate outpatient follow up. Diagnosis was discussed with patient who verbalizes understanding and is agreeable to discharge. I have discussed return precautions with patient who verbalizes understanding of return precautions. Patient strongly encouraged to follow-up with their PCP.    This note was dictated using DragonOne dictation software; please contact for any inconsistencies within the note.   Final Clinical Impressions(s) / ED Diagnoses   Final diagnoses:  Thumb pain, left    ED Discharge Orders    None         Elizabeth Palau 11/14/17 1805    Tilden Fossa, MD 11/15/17 [phone]

## 2018-02-25 ENCOUNTER — Other Ambulatory Visit: Payer: Self-pay

## 2018-02-25 ENCOUNTER — Encounter (HOSPITAL_COMMUNITY): Payer: Self-pay | Admitting: *Deleted

## 2018-02-25 ENCOUNTER — Emergency Department (HOSPITAL_COMMUNITY)
Admission: EM | Admit: 2018-02-25 | Discharge: 2018-02-25 | Disposition: A | Discharge status: Home / Self Care | Payer: Self-pay | Attending: Emergency Medicine | Admitting: Emergency Medicine

## 2018-02-25 ENCOUNTER — Emergency Department (HOSPITAL_COMMUNITY): Payer: Self-pay

## 2018-02-25 DIAGNOSIS — I10 Essential (primary) hypertension: Secondary | ICD-10-CM | POA: Insufficient documentation

## 2018-02-25 DIAGNOSIS — B9789 Other viral agents as the cause of diseases classified elsewhere: Secondary | ICD-10-CM

## 2018-02-25 DIAGNOSIS — J069 Acute upper respiratory infection, unspecified: Secondary | ICD-10-CM | POA: Insufficient documentation

## 2018-02-25 DIAGNOSIS — F17210 Nicotine dependence, cigarettes, uncomplicated: Secondary | ICD-10-CM | POA: Insufficient documentation

## 2018-02-25 MED ORDER — AEROCHAMBER PLUS FLO-VU MEDIUM MISC
1.0000 | Freq: Once | Status: DC
  Filled 2018-02-25: qty 1

## 2018-02-25 MED ORDER — FLUTICASONE PROPIONATE 50 MCG/ACT NA SUSP: 2.0000 | Freq: Every day | NASAL | 2 refills | Status: DC

## 2018-02-25 MED ORDER — ALBUTEROL SULFATE HFA 108 (90 BASE) MCG/ACT IN AERS
2.0000 | INHALATION_SPRAY | RESPIRATORY_TRACT | Status: DC | PRN
  Filled 2018-02-25: qty 6.7

## 2018-02-25 NOTE — ED Triage Notes (Signed)
Cough times one week, increased pain/burning in chest with cough, Productive with yellow sputum.

## 2018-02-25 NOTE — Discharge Instructions (Signed)
1. Medications: flonase, albuterol, usual home medications 2. Treatment: rest, drink plenty of fluids, take tylenol or ibuprofen for fever control 3. Follow Up: Please followup with your primary doctor in 3 days for discussion of your diagnoses and further evaluation after today's visit; if you do not have a primary care doctor use the resource guide provided to find one; Return to the ER for high fevers, difficulty breathing or other concerning symptoms  

## 2018-02-25 NOTE — ED Notes (Signed)
Blanket given at pts request

## 2018-02-25 NOTE — ED Provider Notes (Signed)
[facility]-EMERGENCY DEPT Provider Note   CSN: [id number] Arrival date & time: 02/25/18  0137     History   Chief Complaint Chief Complaint  Patient presents with  . Cough    HPI Ann Moran is a 32 y.o. female with a hx of depression presents to the Emergency Department complaining of gradual, persistent, progressively worsening URI symptoms onset 5 days ago.  Pt reports associated nasal congestion, rhinorrhea, postnasal drip, cough productive of yellow sputum.  She reports her daughter is sick with the same.  She denies fever, chills, headache, neck pain, chest pain, shortness of breath, abdominal pain, nausea, vomiting, diarrhea, weakness, dizziness, syncope.  She does report a "burning" sensation in her chest when she takes a deep breath.  She has been taking over-the-counter cold medication without relief.  Nothing seems to make the symptoms better or worse.  The history is provided by the patient and medical records. No language interpreter was used.    Past Medical History:  Diagnosis Date  . Chlamydia   . Depression    hx of meds, none currently- doing ok  . Infection due to trichomonas   . MRSA (methicillin resistant staph aureus) culture positive    Dec 2012  . Obesity   . Pregnancy induced hypertension   . Urinary tract infection     There are no active problems to display for this patient.   Past Surgical History:  Procedure Laterality Date  . CHOLECYSTECTOMY    . ECTOPIC PREGNANCY SURGERY       OB History    Gravida  2   Para  1   Term  0   Preterm  1   AB  1   Living  1     SAB      TAB      Ectopic  1   Multiple      Live Births  1            Home Medications    Prior to Admission medications   Medication Sig Start Date End Date Taking? Authorizing Provider  fluticasone (FLONASE) 50 MCG/ACT nasal spray Place 2 sprays into both nostrils daily. 02/25/18   Zeshan Sena, Dahlia Client, PA-C    Family  History Family History  Problem Relation Age of Onset  . Hypertension Mother   . Diabetes Mother   . Asthma Father   . Hypertension Father   . Stroke Maternal Grandmother   . Diabetes Paternal Grandmother   . Hypertension Paternal Grandmother     Social History Social History   Tobacco Use  . Smoking status: Current Every Day Smoker    Packs/day: 0.50    Years: 9.00    Pack years: 4.50    Types: Cigarettes  . Smokeless tobacco: Never Used  Substance Use Topics  . Alcohol use: Yes    Comment: occasional  . Drug use: No     Allergies   Patient has no known allergies.   Review of Systems Review of Systems  Constitutional: Positive for fatigue. Negative for appetite change, chills and fever.  HENT: Positive for congestion, postnasal drip, rhinorrhea and sinus pressure. Negative for ear discharge, ear pain, mouth sores and sore throat.   Eyes: Negative for visual disturbance.  Respiratory: Positive for cough and chest tightness. Negative for shortness of breath, wheezing and stridor.   Cardiovascular: Negative for chest pain, palpitations and leg swelling.  Gastrointestinal: Negative for abdominal pain, diarrhea, nausea and vomiting.  Genitourinary:  Negative for dysuria, frequency, hematuria and urgency.  Musculoskeletal: Negative for arthralgias, back pain, myalgias and neck stiffness.  Skin: Negative for rash.  Neurological: Negative for syncope, light-headedness, numbness and headaches.  Hematological: Negative for adenopathy.  Psychiatric/Behavioral: The patient is not nervous/anxious.   All other systems reviewed and are negative.    Physical Exam Updated Vital Signs BP (!) 160/109 (BP Location: Left Arm)   Pulse 99   Temp 98.8 F (37.1 C) (Oral)   Ht 5\' 9"  (1.753 m)   Wt 127 kg   LMP  (LMP Unknown)   SpO2 97%   BMI 41.35 kg/m   Physical Exam  Constitutional: She appears well-developed and well-nourished. No distress.  HENT:  Head: Normocephalic and  atraumatic.  Right Ear: External ear normal.  Left Ear: External ear normal.  Nose: Mucosal edema and rhinorrhea present. No epistaxis. Right sinus exhibits no maxillary sinus tenderness and no frontal sinus tenderness. Left sinus exhibits no maxillary sinus tenderness and no frontal sinus tenderness.  Mouth/Throat: Uvula is midline and mucous membranes are normal. Mucous membranes are not pale and not cyanotic. No oropharyngeal exudate, posterior oropharyngeal edema, posterior oropharyngeal erythema or tonsillar abscesses.  Eyes: Pupils are equal, round, and reactive to light. Conjunctivae are normal.  Neck: Normal range of motion and full passive range of motion without pain.  Cardiovascular: Normal rate and intact distal pulses.  Pulmonary/Chest: Effort normal and breath sounds normal. No stridor.  Clear and equal breath sounds without focal wheezes, rhonchi, rales  Abdominal: Soft. There is no tenderness.  Musculoskeletal: Normal range of motion.  Lymphadenopathy:    She has no cervical adenopathy.  Neurological: She is alert.  Skin: Skin is warm and dry. No rash noted. She is not diaphoretic.  Psychiatric: She has a normal mood and affect.  Nursing note and vitals reviewed.    ED Treatments / Results   Radiology Dg Chest 2 View  Result Date: 02/25/2018 CLINICAL DATA:  Productive cough for 1+ week. EXAM: CHEST - 2 VIEW COMPARISON:  06/06/2016 FINDINGS: The cardiomediastinal silhouette is unchanged and within normal limits. No airspace consolidation, edema, pleural effusion, or pneumothorax is identified. No acute osseous abnormality is seen. IMPRESSION: No active cardiopulmonary disease. Electronically Signed   By: Sebastian Ache M.D.   On: 02/25/2018 02:58    Procedures Procedures (including critical care time)  Medications Ordered in ED Medications  albuterol (PROVENTIL HFA;VENTOLIN HFA) 108 (90 Base) MCG/ACT inhaler 2 puff (has no administration in time range)  AEROCHAMBER  PLUS FLO-VU MEDIUM MISC 1 each (has no administration in time range)     Initial Impression / Assessment and Plan / ED Course  I have reviewed the triage vital signs and the nursing notes.  Pertinent labs & imaging results that were available during my care of the patient were reviewed by me and considered in my medical decision making (see chart for details).       Pt CXR negative for acute infiltrate. I personally evaluated the images. Patients symptoms are consistent with URI, likely viral etiology. Discussed that antibiotics are not indicated for viral infections. Pt will be discharged with symptomatic treatment.   Patient noted to be hypertensive in the emergency department.  No signs of hypertensive urgency.  Discussed with patient the need for close follow-up and management by their primary care physician.   Verbalizes understanding and is agreeable with plan. Pt is hemodynamically stable & in NAD prior to dc.   Final Clinical Impressions(s) / ED Diagnoses  Final diagnoses:  Viral URI with cough  Hypertension, unspecified type    ED Discharge Orders         Ordered    fluticasone (FLONASE) 50 MCG/ACT nasal spray  Daily     02/25/18 0318           Messiah Rovira, Dahlia Client, PA-C 02/25/18 0324    Nira Conn, MD 02/25/18 [phone]

## 2018-02-25 NOTE — ED Notes (Signed)
Bed: WTR7 Expected date:  Expected time:  Means of arrival:  Comments: 

## 2018-03-04 ENCOUNTER — Encounter (HOSPITAL_COMMUNITY): Payer: Self-pay | Admitting: Emergency Medicine

## 2018-03-04 ENCOUNTER — Emergency Department (HOSPITAL_COMMUNITY)
Admission: EM | Admit: 2018-03-04 | Discharge: 2018-03-04 | Disposition: A | Discharge status: Home / Self Care | Payer: Self-pay | Attending: Emergency Medicine | Admitting: Emergency Medicine

## 2018-03-04 ENCOUNTER — Other Ambulatory Visit: Payer: Self-pay

## 2018-03-04 DIAGNOSIS — F17210 Nicotine dependence, cigarettes, uncomplicated: Secondary | ICD-10-CM | POA: Insufficient documentation

## 2018-03-04 DIAGNOSIS — Z79899 Other long term (current) drug therapy: Secondary | ICD-10-CM | POA: Insufficient documentation

## 2018-03-04 DIAGNOSIS — M940 Chondrocostal junction syndrome [Tietze]: Secondary | ICD-10-CM | POA: Insufficient documentation

## 2018-03-04 DIAGNOSIS — J069 Acute upper respiratory infection, unspecified: Secondary | ICD-10-CM | POA: Insufficient documentation

## 2018-03-04 LAB — GROUP A STREP BY PCR: GROUP A STREP BY PCR: NOT DETECTED

## 2018-03-04 MED ORDER — CETIRIZINE-PSEUDOEPHEDRINE ER 5-120 MG PO TB12: 1.0000 | ORAL_TABLET | Freq: Every day | ORAL | 0 refills | Status: DC

## 2018-03-04 MED ORDER — BENZONATATE 200 MG PO CAPS: 200.0000 mg | ORAL_CAPSULE | Freq: Three times a day (TID) | ORAL | 0 refills | Status: AC

## 2018-03-04 MED ORDER — ALBUTEROL SULFATE HFA 108 (90 BASE) MCG/ACT IN AERS: 1.0000 | INHALATION_SPRAY | Freq: Four times a day (QID) | RESPIRATORY_TRACT | 0 refills | Status: DC | PRN

## 2018-03-04 MED ORDER — DEXAMETHASONE 4 MG PO TABS
10.0000 mg | ORAL_TABLET | Freq: Once | ORAL | Status: AC
  Administered 2018-03-04: 10 mg via ORAL
  Filled 2018-03-04: qty 3

## 2018-03-04 NOTE — ED Notes (Signed)
Pt notified of DC. Pt getting dressed

## 2018-03-04 NOTE — ED Triage Notes (Addendum)
Patient here with upper respiratory infection, diagnosed with bronchitis.  Patient having sore throat now, states she was taking a cough medicine and albuterol inhaler but it is not helping her.  She denies any fever, but she does have shortness of breath, feels like she was punched in the chest and complaining of chest pain.

## 2018-03-04 NOTE — ED Provider Notes (Signed)
[facility] EMERGENCY DEPARTMENT Provider Note   CSN: [id number] Arrival date & time: 03/04/18  4098     History   Chief Complaint Chief Complaint  Patient presents with  . Sore Throat    HPI Ann Moran is a 32 y.o. female.  32yo female presents with complaint of ongoing cough, sore throat, congestion, chest soreness/tenderness to the touch. Patient states symptoms started 1.5 weeks ago, seen at [facility] 1 week ago, CX normal and advised viral illness. Patient states she is taking OTC cold medicine including Alkaseltzer cold. Patient is a daily smoker, denies SHOB, wheezing, fevers, chills. Cough is non productive. No other complaints or concerns.      Past Medical History:  Diagnosis Date  . Chlamydia   . Depression    hx of meds, none currently- doing ok  . Infection due to trichomonas   . MRSA (methicillin resistant staph aureus) culture positive    Dec 2012  . Obesity   . Pregnancy induced hypertension   . Urinary tract infection     There are no active problems to display for this patient.   Past Surgical History:  Procedure Laterality Date  . CHOLECYSTECTOMY    . ECTOPIC PREGNANCY SURGERY       OB History    Gravida  2   Para  1   Term  0   Preterm  1   AB  1   Living  1     SAB      TAB      Ectopic  1   Multiple      Live Births  1            Home Medications    Prior to Admission medications   Medication Sig Start Date End Date Taking? Authorizing Provider  albuterol (PROVENTIL HFA;VENTOLIN HFA) 108 (90 Base) MCG/ACT inhaler Inhale 1-2 puffs into the lungs every 6 (six) hours as needed for wheezing or shortness of breath. 03/04/18   Jeannie Fend, PA-C  benzonatate (TESSALON) 200 MG capsule Take 1 capsule (200 mg total) by mouth every 8 (eight) hours for 10 days. 03/04/18 03/14/18  Jeannie Fend, PA-C  cetirizine-pseudoephedrine (ZYRTEC-D) 5-120 MG tablet Take 1 tablet by mouth daily. 03/04/18   Jeannie Fend, PA-C  fluticasone (FLONASE) 50 MCG/ACT nasal spray Place 2 sprays into both nostrils daily. 02/25/18   Muthersbaugh, Dahlia Client, PA-C    Family History Family History  Problem Relation Age of Onset  . Hypertension Mother   . Diabetes Mother   . Asthma Father   . Hypertension Father   . Stroke Maternal Grandmother   . Diabetes Paternal Grandmother   . Hypertension Paternal Grandmother     Social History Social History   Tobacco Use  . Smoking status: Current Every Day Smoker    Packs/day: 0.50    Years: 9.00    Pack years: 4.50    Types: Cigarettes  . Smokeless tobacco: Never Used  Substance Use Topics  . Alcohol use: Yes    Comment: occasional  . Drug use: No     Allergies   Patient has no known allergies.   Review of Systems Review of Systems  Constitutional: Negative for chills and fever.  HENT: Positive for congestion, postnasal drip, rhinorrhea and sore throat. Negative for ear pain, sinus pressure, sinus pain and sneezing.   Eyes: Negative for discharge and redness.  Respiratory: Positive for cough. Negative for shortness of breath and  wheezing.   Cardiovascular: Positive for chest pain.  Musculoskeletal: Negative for arthralgias and myalgias.  Skin: Negative for rash.  Allergic/Immunologic: Negative for immunocompromised state.  Neurological: Negative for headaches.  Hematological: Negative for adenopathy.  All other systems reviewed and are negative.    Physical Exam Updated Vital Signs BP (!) 138/98 (BP Location: Right Arm)   Pulse 75   Temp 97.8 F (36.6 C) (Oral)   Resp 17   Ht 5\' 9"  (1.753 m)   Wt 127 kg   LMP  (LMP Unknown)   SpO2 97%   BMI 41.35 kg/m   Physical Exam  HENT:  Right Ear: Tympanic membrane, external ear and ear canal normal.  Left Ear: Tympanic membrane, external ear and ear canal normal.  Nose: Mucosal edema present. Right sinus exhibits no maxillary sinus tenderness and no frontal sinus tenderness. Left sinus  exhibits no maxillary sinus tenderness and no frontal sinus tenderness.  Mouth/Throat: Uvula is midline and mucous membranes are normal. No trismus in the jaw. Posterior oropharyngeal erythema present. No posterior oropharyngeal edema or tonsillar abscesses. Tonsils are 1+ on the right. Tonsils are 1+ on the left. No tonsillar exudate.  Pulmonary/Chest: She exhibits bony tenderness.  TTP along right/left sternum       ED Treatments / Results  Labs (all labs ordered are listed, but only abnormal results are displayed) Labs Reviewed  GROUP A STREP BY PCR    EKG None  Radiology No results found.  Procedures Procedures (including critical care time)  Medications Ordered in ED Medications  dexamethasone (DECADRON) tablet 10 mg (10 mg Oral Given 03/04/18 0807)     Initial Impression / Assessment and Plan / ED Course  I have reviewed the triage vital signs and the nursing notes.  Pertinent labs & imaging results that were available during my care of the patient were reviewed by me and considered in my medical decision making (see chart for details).  Clinical Course as of Mar 04 954  Tue Mar 04, 2018  [id number] 32yo female with URI symptoms x a week and a half returns with ongoing sore throat, congestion, nonproductive cough.  Denies fever, shortness of breath, wheezing.  [facility] 1 week ago, chest x-ray normal, her lung sounds are clear and her vitals are unremarkable.  On exam she has boggy nasal membranes with postnasal drip and sounds congested.  Rapid strep test is negative.  Patient was given Decadron while in the ER for her sore throat.  Patient was given prescription for Zyrtec-D, Tessalon.  She is a smoker, discussed potential to develop wheezing or bronchitis.  Advised to recheck with PCP, referral given.  Return to ER for fever, shortness of breath, wheezing or any other concerning symptoms.   [LM]    Clinical Course User Index [LM] Jeannie FendMurphy, Katerina Zurn  A, PA-C   Final Clinical Impressions(s) / ED Diagnoses   Final diagnoses:  Viral upper respiratory tract infection  Costochondritis    ED Discharge Orders         Ordered    benzonatate (TESSALON) 200 MG capsule  Every 8 hours     03/04/18 0743    albuterol (PROVENTIL HFA;VENTOLIN HFA) 108 (90 Base) MCG/ACT inhaler  Every 6 hours PRN     03/04/18 0743    cetirizine-pseudoephedrine (ZYRTEC-D) 5-120 MG tablet  Daily     03/04/18 0743           Jeannie FendMurphy, Hamzeh Tall A, PA-C 03/04/18 [id number]  Geoffery Lyons, MD 03/04/18 1535

## 2018-03-04 NOTE — Discharge Instructions (Addendum)
Saline sinus rinse twice daily. Flonase daily. Zyrtec D as directed. Tessalon as needed as prescribed for cough. Inhaler as needed as prescribed for cough. Motrin as needed as directed for chest wall pain.  Follow up with your doctor, referral given to [facility] if needed.

## 2018-04-30 ENCOUNTER — Encounter (HOSPITAL_COMMUNITY): Payer: Self-pay | Admitting: Emergency Medicine

## 2018-04-30 ENCOUNTER — Emergency Department (HOSPITAL_COMMUNITY)
Admission: EM | Admit: 2018-04-30 | Discharge: 2018-04-30 | Disposition: A | Discharge status: Home / Self Care | Payer: Self-pay | Attending: Emergency Medicine | Admitting: Emergency Medicine

## 2018-04-30 ENCOUNTER — Other Ambulatory Visit: Payer: Self-pay

## 2018-04-30 DIAGNOSIS — R109 Unspecified abdominal pain: Secondary | ICD-10-CM | POA: Insufficient documentation

## 2018-04-30 DIAGNOSIS — R197 Diarrhea, unspecified: Secondary | ICD-10-CM | POA: Insufficient documentation

## 2018-04-30 DIAGNOSIS — R112 Nausea with vomiting, unspecified: Secondary | ICD-10-CM | POA: Insufficient documentation

## 2018-04-30 DIAGNOSIS — R6883 Chills (without fever): Secondary | ICD-10-CM | POA: Insufficient documentation

## 2018-04-30 DIAGNOSIS — F17210 Nicotine dependence, cigarettes, uncomplicated: Secondary | ICD-10-CM | POA: Insufficient documentation

## 2018-04-30 DIAGNOSIS — Z79899 Other long term (current) drug therapy: Secondary | ICD-10-CM | POA: Insufficient documentation

## 2018-04-30 LAB — URINALYSIS, ROUTINE W REFLEX MICROSCOPIC
Bilirubin Urine: NEGATIVE
Glucose, UA: NEGATIVE mg/dL
Hgb urine dipstick: NEGATIVE
Ketones, ur: NEGATIVE mg/dL
Leukocytes, UA: NEGATIVE
Nitrite: NEGATIVE
Protein, ur: NEGATIVE mg/dL
SPECIFIC GRAVITY, URINE: 1.006 (ref 1.005–1.030)
pH: 8 (ref 5.0–8.0)

## 2018-04-30 LAB — COMPREHENSIVE METABOLIC PANEL
ALT: 15 U/L (ref 0–44)
AST: 14 U/L — ABNORMAL LOW (ref 15–41)
Albumin: 4.3 g/dL (ref 3.5–5.0)
Alkaline Phosphatase: 46 U/L (ref 38–126)
Anion gap: 8 (ref 5–15)
BUN: 8 mg/dL (ref 6–20)
CO2: 27 mmol/L (ref 22–32)
Calcium: 8.6 mg/dL — ABNORMAL LOW (ref 8.9–10.3)
Chloride: 104 mmol/L (ref 98–111)
Creatinine, Ser: 0.59 mg/dL (ref 0.44–1.00)
GFR calc Af Amer: >60 mL/min (ref >60)
GFR calc non Af Amer: >60 mL/min (ref >60)
Glucose, Bld: 90 mg/dL (ref 70–99)
Potassium: 3.9 mmol/L (ref 3.5–5.1)
Sodium: 139 mmol/L (ref 135–145)
Total Bilirubin: 0.9 mg/dL (ref 0.3–1.2)
Total Protein: 7.6 g/dL (ref 6.5–8.1)

## 2018-04-30 LAB — CBC
HCT: 37.7 % (ref 36.0–46.0)
Hemoglobin: 12 g/dL (ref 12.0–15.0)
MCH: 30.1 pg (ref 26.0–34.0)
MCHC: 31.8 g/dL (ref 30.0–36.0)
MCV: 94.5 fL (ref 80.0–100.0)
Platelets: 283 10*3/uL (ref 150–400)
RBC: 3.99 MIL/uL (ref 3.87–5.11)
RDW: 12.9 % (ref 11.5–15.5)
WBC: 6 10*3/uL (ref 4.0–10.5)
nRBC: 0 % (ref 0.0–0.2)

## 2018-04-30 LAB — LIPASE, BLOOD: Lipase: 25 U/L (ref 11–51)

## 2018-04-30 LAB — I-STAT BETA HCG BLOOD, ED (MC, WL, AP ONLY): I-stat hCG, quantitative: <5 m[IU]/mL (ref <5)

## 2018-04-30 MED ORDER — ONDANSETRON HCL 4 MG PO TABS: 4.0000 mg | ORAL_TABLET | Freq: Four times a day (QID) | ORAL | 0 refills | Status: AC

## 2018-04-30 MED ORDER — LACTATED RINGERS IV BOLUS
1000.0000 mL | Freq: Once | INTRAVENOUS | Status: AC
  Administered 2018-04-30: 1000 mL via INTRAVENOUS

## 2018-04-30 MED ORDER — ONDANSETRON HCL 4 MG/2ML IJ SOLN
4.0000 mg | Freq: Once | INTRAMUSCULAR | Status: AC
  Administered 2018-04-30: 4 mg via INTRAVENOUS
  Filled 2018-04-30: qty 2

## 2018-04-30 NOTE — ED Triage Notes (Signed)
Pt reports since Saturday she had n/v, diarrhea stopped on Saturday. Also c/o abd pains that are intermittent.

## 2018-04-30 NOTE — ED Provider Notes (Signed)
[facility]-EMERGENCY DEPT Provider Note   CSN: [id number] Arrival date & time: 04/30/18  4196     History   Chief Complaint Chief Complaint  Patient presents with  . Emesis  . Abdominal Pain    HPI Ann Moran is a 33 y.o. female.  The history is provided by the patient.  Diarrhea  Quality:  Watery Severity:  Mild Onset quality:  Gradual Timing:  Intermittent Progression:  Unchanged Relieved by:  Nothing Worsened by:  Nothing Associated symptoms: abdominal pain (intermittent), chills and vomiting   Associated symptoms: no arthralgias, no fever and no myalgias   Risk factors: sick contacts   Risk factors: no recent antibiotic use     Past Medical History:  Diagnosis Date  . Chlamydia   . Depression    hx of meds, none currently- doing ok  . Infection due to trichomonas   . MRSA (methicillin resistant staph aureus) culture positive    Dec 2012  . Obesity   . Pregnancy induced hypertension   . Urinary tract infection     There are no active problems to display for this patient.   Past Surgical History:  Procedure Laterality Date  . CHOLECYSTECTOMY    . ECTOPIC PREGNANCY SURGERY       OB History    Gravida  2   Para  1   Term  0   Preterm  1   AB  1   Living  1     SAB      TAB      Ectopic  1   Multiple      Live Births  1            Home Medications    Prior to Admission medications   Medication Sig Start Date End Date Taking? Authorizing Provider  acetaminophen (TYLENOL) 500 MG tablet Take 1,000 mg by mouth daily as needed.   Yes [provider]  albuterol (PROVENTIL HFA;VENTOLIN HFA) 108 (90 Base) MCG/ACT inhaler Inhale 1-2 puffs into the lungs every 6 (six) hours as needed for wheezing or shortness of breath. Patient not taking: Reported on 04/30/2018 03/04/18   Jeannie Fend, PA-C  cetirizine-pseudoephedrine (ZYRTEC-D) 5-120 MG tablet Take 1 tablet by mouth daily. Patient not taking:  Reported on 04/30/2018 03/04/18   Army Melia A, PA-C  fluticasone [facility]) 50 MCG/ACT nasal spray Place 2 sprays into both nostrils daily. Patient not taking: Reported on 04/30/2018 02/25/18   Muthersbaugh, Dahlia Client, PA-C  ondansetron (ZOFRAN) 4 MG tablet Take 1 tablet (4 mg total) by mouth every 6 (six) hours for 12 doses. 04/30/18 05/03/18  Virgina Norfolk, DO    Family History Family History  Problem Relation Age of Onset  . Hypertension Mother   . Diabetes Mother   . Asthma Father   . Hypertension Father   . Stroke Maternal Grandmother   . Diabetes Paternal Grandmother   . Hypertension Paternal Grandmother     Social History Social History   Tobacco Use  . Smoking status: Current Every Day Smoker    Packs/day: 0.50    Years: 9.00    Pack years: 4.50    Types: Cigarettes  . Smokeless tobacco: Never Used  Substance Use Topics  . Alcohol use: Yes    Comment: occasional  . Drug use: No     Allergies   Patient has no known allergies.   Review of Systems Review of Systems  Constitutional: Positive for chills. Negative for  fever.  HENT: Negative for ear pain and sore throat.   Eyes: Negative for pain and visual disturbance.  Respiratory: Negative for cough and shortness of breath.   Cardiovascular: Negative for chest pain and palpitations.  Gastrointestinal: Positive for abdominal pain (intermittent), diarrhea and vomiting.  Genitourinary: Negative for dysuria and hematuria.  Musculoskeletal: Negative for arthralgias, back pain and myalgias.  Skin: Negative for color change and rash.  Neurological: Negative for seizures and syncope.  All other systems reviewed and are negative.    Physical Exam Updated Vital Signs  ED Triage Vitals  Enc Vitals Group     BP 04/30/18 0945 (!) 155/97     Pulse Rate 04/30/18 0945 74     Resp 04/30/18 0945 16     Temp 04/30/18 0945 98.2 F (36.8 C)     Temp Source 04/30/18 0945 Oral     SpO2 04/30/18 0945 96 %     Weight 04/30/18  1101 (!) 301 lb (136.5 kg)     Height 04/30/18 1101 5\' 9"  (1.753 m)     Head Circumference --      Peak Flow --      Pain Score 04/30/18 0947 7     Pain Loc --      Pain Edu? --      Excl. in GC? --     Physical Exam Vitals signs and nursing note reviewed.  Constitutional:      General: She is not in acute distress.    Appearance: She is well-developed.  HENT:     Head: Normocephalic and atraumatic.     Mouth/Throat:     Mouth: Mucous membranes are moist.  Eyes:     Extraocular Movements: Extraocular movements intact.     Conjunctiva/sclera: Conjunctivae normal.  Neck:     Musculoskeletal: Neck supple.  Cardiovascular:     Rate and Rhythm: Normal rate and regular rhythm.     Heart sounds: Normal heart sounds. No murmur.  Pulmonary:     Effort: Pulmonary effort is normal. No respiratory distress.     Breath sounds: Normal breath sounds.  Abdominal:     General: Bowel sounds are normal.     Palpations: Abdomen is soft.     Tenderness: There is no abdominal tenderness. There is no right CVA tenderness, left CVA tenderness, guarding or rebound.  Skin:    General: Skin is warm and dry.     Capillary Refill: Capillary refill takes less than 2 seconds.  Neurological:     General: No focal deficit present.     Mental Status: She is alert.  Psychiatric:        Mood and Affect: Mood normal.      ED Treatments / Results  Labs (all labs ordered are listed, but only abnormal results are displayed) Labs Reviewed  COMPREHENSIVE METABOLIC PANEL - Abnormal; Notable for the following components:      Result Value   Calcium 8.6 (*)    AST 14 (*)    All other components within normal limits  URINALYSIS, ROUTINE W REFLEX MICROSCOPIC - Abnormal; Notable for the following components:   Color, Urine STRAW (*)    All other components within normal limits  LIPASE, BLOOD  CBC  I-STAT BETA HCG BLOOD, ED ([facility], [facility], [facility] ONLY)    EKG None  Radiology No results  found.  Procedures Procedures (including critical care time)  Medications Ordered in ED Medications  lactated ringers bolus 1,000 mL (0 mLs Intravenous Stopped  04/30/18 1227)  ondansetron (ZOFRAN) injection 4 mg (4 mg Intravenous Given 04/30/18 1126)     Initial Impression / Assessment and Plan / ED Course  I have reviewed the triage vital signs and the nursing notes.  Pertinent labs & imaging results that were available during my care of the patient were reviewed by me and considered in my medical decision making (see chart for details).     Ann Moran is a 33 year old female with no significant medical history presents to the ED with nausea, vomiting, diarrhea.  Patient with symptoms for the last 2 days.  Patient with unremarkable vitals.  No fever.  Patient overall well-appearing.  No abdominal tenderness on exam.  Denies any vaginal discharge, bleeding.  Denies pregnancy.  Patient with nonbloody, nonbilious emesis and diarrhea.  Has had sick contacts.  No suspicious food intake.  No recent antibiotics.  Lab work showed no significant anemia, electrolyte abnormality, kidney injury.  Patient was given IV fluids, Zofran with improvement of symptoms.  Likely viral process.  Repeat abdominal exam was unremarkable.  No concern for intra-abdominal process such as appendicitis.  Pregnancy test was negative and doubt any pelvic pathology.  Given return precautions and discharged from the ED in good condition.  Given prescription for Zofran.  This chart was dictated using voice recognition software.  Despite best efforts to proofread,  errors can occur which can change the documentation meaning.   Final Clinical Impressions(s) / ED Diagnoses   Final diagnoses:  Nausea vomiting and diarrhea    ED Discharge Orders         Ordered    ondansetron (ZOFRAN) 4 MG tablet  Every 6 hours     04/30/18 1342           Phuc Kluttz, DO 04/30/18 1435

## 2018-04-30 NOTE — ED Notes (Signed)
RX X 1 GIVEN 

## 2018-06-06 ENCOUNTER — Encounter (HOSPITAL_COMMUNITY): Payer: Self-pay | Admitting: *Deleted

## 2018-06-06 ENCOUNTER — Inpatient Hospital Stay (HOSPITAL_COMMUNITY)
Admission: AD | Admit: 2018-06-06 | Discharge: 2018-06-06 | Disposition: A | Discharge status: Home / Self Care | Payer: Self-pay | Attending: Obstetrics & Gynecology | Admitting: Obstetrics & Gynecology

## 2018-06-06 ENCOUNTER — Other Ambulatory Visit: Payer: Self-pay

## 2018-06-06 DIAGNOSIS — Z3202 Encounter for pregnancy test, result negative: Secondary | ICD-10-CM | POA: Insufficient documentation

## 2018-06-06 DIAGNOSIS — N939 Abnormal uterine and vaginal bleeding, unspecified: Secondary | ICD-10-CM | POA: Insufficient documentation

## 2018-06-06 DIAGNOSIS — Z6841 Body Mass Index (BMI) 40.0 and over, adult: Secondary | ICD-10-CM

## 2018-06-06 DIAGNOSIS — F17210 Nicotine dependence, cigarettes, uncomplicated: Secondary | ICD-10-CM | POA: Insufficient documentation

## 2018-06-06 DIAGNOSIS — N926 Irregular menstruation, unspecified: Secondary | ICD-10-CM

## 2018-06-06 LAB — URINALYSIS, ROUTINE W REFLEX MICROSCOPIC
Bilirubin Urine: NEGATIVE
Glucose, UA: NEGATIVE mg/dL
Ketones, ur: NEGATIVE mg/dL
Leukocytes,Ua: NEGATIVE
Nitrite: NEGATIVE
Protein, ur: NEGATIVE mg/dL
Specific Gravity, Urine: 1.015 (ref 1.005–1.030)
pH: 7 (ref 5.0–8.0)

## 2018-06-06 LAB — URINALYSIS, MICROSCOPIC (REFLEX)

## 2018-06-06 LAB — WET PREP, GENITAL
Clue Cells Wet Prep HPF POC: NONE SEEN
Sperm: NONE SEEN
Trich, Wet Prep: NONE SEEN
YEAST WET PREP: NONE SEEN

## 2018-06-06 LAB — POCT PREGNANCY, URINE: Preg Test, Ur: NEGATIVE

## 2018-06-06 MED ORDER — IBUPROFEN 800 MG PO TABS
800.0000 mg | ORAL_TABLET | Freq: Once | ORAL | Status: AC
  Administered 2018-06-06: 800 mg via ORAL
  Filled 2018-06-06: qty 1

## 2018-06-06 NOTE — MAU Provider Note (Signed)
History     CSN: [id number]  Arrival date and time: 06/06/18 3846   First Provider Initiated Contact with Patient 06/06/18 [phone]      Chief Complaint  Patient presents with  . Vaginal Bleeding   HPI  Ann Moran is a 33 y.o. [phone] who presents to MAU with chief complaint of vaginal bleeding in early pregnancy. She believes herself to be [redacted] weeks pregnant. She endorses LMP of August 2019 with most recent intercourse in October 2019. She reports intermittent condom use with female partners, unsure of partner testing status.  Patient reports seeing bleeding when she wipes for the past three weeks. She also reports mild suprapubic cramping which radiates to her RLQ. She is managing this discomfort with OTC Ibuprofen and Tylenol. She denies aggravating or alleviating factors.  Patient denies urinary symptoms, abnormal vaginal discharge, heavy vaginal bleeding, SOB, weakness, syncope, abdominal tenderness, fever or recent illness.   Pertinent Gynecological History: Menses: irregular, pt endorses menstrual cycle 2-4 times per year Bleeding: dysfunctional uterine bleeding  Contraception: intermittent condom use Sexually transmitted diseases: past history: Chlamydia, Trichomonas Last pap: Normal per patient Date: Remote   Past Medical History:  Diagnosis Date  . Chlamydia   . Depression    hx of meds, none currently- doing ok  . Infection due to trichomonas   . MRSA (methicillin resistant staph aureus) culture positive    Dec 2012  . Obesity   . Pregnancy induced hypertension   . Urinary tract infection     Past Surgical History:  Procedure Laterality Date  . CHOLECYSTECTOMY    . ECTOPIC PREGNANCY SURGERY      Family History  Problem Relation Age of Onset  . Hypertension Mother   . Diabetes Mother   . Asthma Father   . Hypertension Father   . Stroke Maternal Grandmother   . Diabetes Paternal Grandmother   . Hypertension Paternal Grandmother     Social History    Tobacco Use  . Smoking status: Current Every Day Smoker    Packs/day: 0.50    Years: 9.00    Pack years: 4.50    Types: Cigarettes  . Smokeless tobacco: Never Used  Substance Use Topics  . Alcohol use: Yes    Comment: occasional  . Drug use: No    Allergies: No Known Allergies  Medications Prior to Admission  Medication Sig Dispense Refill Last Dose  . acetaminophen (TYLENOL) 500 MG tablet Take 1,000 mg by mouth daily as needed.   04/29/2018 at Unknown time  . albuterol (PROVENTIL HFA;VENTOLIN HFA) 108 (90 Base) MCG/ACT inhaler Inhale 1-2 puffs into the lungs every 6 (six) hours as needed for wheezing or shortness of breath. (Patient not taking: Reported on 04/30/2018) 1 Inhaler 0 Not Taking at Unknown time  . cetirizine-pseudoephedrine (ZYRTEC-D) 5-120 MG tablet Take 1 tablet by mouth daily. (Patient not taking: Reported on 04/30/2018) 12 tablet 0 Not Taking at Unknown time  . fluticasone (FLONASE) 50 MCG/ACT nasal spray Place 2 sprays into both nostrils daily. (Patient not taking: Reported on 04/30/2018) 9.9 g 2 Not Taking at Unknown time    Review of Systems  Constitutional: Negative for chills, fatigue and fever.  Respiratory: Negative for shortness of breath.   Gastrointestinal: Positive for abdominal pain.  Genitourinary: Positive for menstrual problem and vaginal bleeding. Negative for difficulty urinating, dyspareunia, dysuria and flank pain.  Musculoskeletal: Negative for back pain.  Neurological: Negative for dizziness, syncope, weakness and light-headedness.  All other systems reviewed and are  negative.  Physical Exam   Blood pressure (!) 130/95, pulse 80, temperature (!) 97.5 F (36.4 C), temperature source Oral, resp. rate 20, height 5\' 9"  (1.753 m), weight (!) 137.2 kg, SpO2 96 %.  Physical Exam  Nursing note and vitals reviewed. Constitutional: She is oriented to person, place, and time. She appears well-developed and well-nourished.  Cardiovascular: Normal rate.   Respiratory: Effort normal.  GI: Soft. She exhibits no distension. There is no abdominal tenderness. There is no rebound, no guarding and no CVA tenderness.  Genitourinary:    Vagina and uterus normal.  Cervix exhibits no motion tenderness. Right adnexum displays no mass, no tenderness and no fullness. Left adnexum displays no mass, no tenderness and no fullness.    Genitourinary Comments: Scant bleeding with mucus removed with fox swab x 1, no additional bleeding afterwards   Musculoskeletal: Normal range of motion.  Neurological: She is alert and oriented to person, place, and time.  Skin: Skin is warm and dry.  Psychiatric: She has a normal mood and affect. Her behavior is normal. Judgment and thought content normal.    MAU Course/MDM   --Notes and lab results from previous ED visit reviewed --Negative pregnancy test in ED 04/2018 --Negative pregnancy test today in MAU --Patient declines CBC  --Patient denies urinary symptoms --Abdominal pain resolves with alternating Tylenol and Ibuprofen --Patient does not desire pregnancy but would like more predictable menstrual cycles. She states her periods have always been irregular but she has never had prolonged bleeding to this extent. Briefly discussed interventions for abnormal uterine bleeding which might be available to her via outpatient follow up  Patient Vitals for the past 24 hrs:  BP Temp Temp src Pulse Resp SpO2 Height Weight  06/06/18 0939 112/68 97.9 F (36.6 C) Oral 74 18 100 % - -  06/06/18 0840 (!) 130/95 (!) 97.5 F (36.4 C) Oral 80 20 96 % 5\' 9"  (1.753 m) (!) 137.2 kg    Results for orders placed or performed during the hospital encounter of 06/06/18 (from the past 24 hour(s))  Urinalysis, Routine w reflex microscopic     Status: Abnormal   Collection Time: 06/06/18  8:18 AM  Result Value Ref Range   Color, Urine YELLOW YELLOW   APPearance CLEAR CLEAR   Specific Gravity, Urine 1.015 1.005 - 1.030   pH 7.0 5.0 - 8.0    Glucose, UA NEGATIVE NEGATIVE mg/dL   Hgb urine dipstick LARGE (A) NEGATIVE   Bilirubin Urine NEGATIVE NEGATIVE   Ketones, ur NEGATIVE NEGATIVE mg/dL   Protein, ur NEGATIVE NEGATIVE mg/dL   Nitrite NEGATIVE NEGATIVE   Leukocytes,Ua NEGATIVE NEGATIVE  Urinalysis, Microscopic (reflex)     Status: Abnormal   Collection Time: 06/06/18  8:18 AM  Result Value Ref Range   RBC / HPF 0-5 0 - 5 RBC/hpf   WBC, UA 0-5 0 - 5 WBC/hpf   Bacteria, UA MANY (A) NONE SEEN   Squamous Epithelial / LPF 0-5 0 - 5   Mucus PRESENT   Pregnancy, urine POC     Status: None   Collection Time: 06/06/18  8:56 AM  Result Value Ref Range   Preg Test, Ur NEGATIVE NEGATIVE  Wet prep, genital     Status: Abnormal   Collection Time: 06/06/18  9:05 AM  Result Value Ref Range   Yeast Wet Prep HPF POC NONE SEEN NONE SEEN   Trich, Wet Prep NONE SEEN NONE SEEN   Clue Cells Wet Prep HPF POC NONE SEEN  NONE SEEN   WBC, Wet Prep HPF POC MANY (A) NONE SEEN   Sperm NONE SEEN      Assessment and Plan  --33 y.o. [id number], negative pregnancy test today --Abnormal uterine bleeding now with prolonged period of scant bleeding --Continue pain management by alternating Tylenol and Ibuprofen --Discharge home in stable condition  F/U: Patient to establish care with GYN, also aware of walk-in clinic available at [facility], hours and contact information provided  Calvert Cantor, CNM 06/06/2018, 10:10 AM

## 2018-06-06 NOTE — Discharge Instructions (Signed)
Abnormal Uterine Bleeding  Abnormal uterine bleeding means bleeding more than usual from your uterus. It can include:   Bleeding between periods.   Bleeding after sex.   Bleeding that is heavier than normal.   Periods that last longer than usual.   Bleeding after you have stopped having your period (menopause).  There are many problems that may cause this. You should see a doctor for any kind of bleeding that is not normal. Treatment depends on the cause of the bleeding.  Follow these instructions at home:   Watch your condition for any changes.   Do not use tampons, douche, or have sex, if your doctor tells you not to.   Change your pads often.   Get regular well-woman exams. Make sure they include a pelvic exam and cervical cancer screening.   Keep all follow-up visits as told by your doctor. This is important.  Contact a doctor if:   The bleeding lasts more than one week.   You feel dizzy at times.   You feel like you are going to throw up (nauseous).   You throw up.  Get help right away if:   You pass out.   You have to change pads every hour.   You have belly (abdominal) pain.   You have a fever.   You get sweaty.   You get weak.   You passing large blood clots from your vagina.  Summary   Abnormal uterine bleeding means bleeding more than usual from your uterus.   There are many problems that may cause this. You should see a doctor for any kind of bleeding that is not normal.   Treatment depends on the cause of the bleeding.  This information is not intended to replace advice given to you by your health care provider. Make sure you discuss any questions you have with your health care provider.  Document Released: 02/04/2009 Document Revised: 04/03/2016 Document Reviewed: 04/03/2016  Elsevier Interactive Patient Education  2019 [employer]

## 2018-06-06 NOTE — MAU Note (Signed)
Pt presents with c/o VB with wiping x3 weeks.  LMP 11/2017.

## 2018-06-09 LAB — GC/CHLAMYDIA PROBE AMP (~~LOC~~) NOT AT ARMC
Chlamydia: NEGATIVE
Neisseria Gonorrhea: NEGATIVE

## 2018-06-30 ENCOUNTER — Emergency Department (HOSPITAL_COMMUNITY)
Admission: EM | Admit: 2018-06-30 | Discharge: 2018-06-30 | Disposition: A | Discharge status: Home / Self Care | Payer: Self-pay | Attending: Emergency Medicine | Admitting: Emergency Medicine

## 2018-06-30 ENCOUNTER — Other Ambulatory Visit: Payer: Self-pay

## 2018-06-30 ENCOUNTER — Encounter (HOSPITAL_COMMUNITY): Payer: Self-pay

## 2018-06-30 ENCOUNTER — Emergency Department (HOSPITAL_COMMUNITY): Payer: Self-pay

## 2018-06-30 DIAGNOSIS — Z79899 Other long term (current) drug therapy: Secondary | ICD-10-CM | POA: Insufficient documentation

## 2018-06-30 DIAGNOSIS — N938 Other specified abnormal uterine and vaginal bleeding: Secondary | ICD-10-CM | POA: Insufficient documentation

## 2018-06-30 DIAGNOSIS — F17210 Nicotine dependence, cigarettes, uncomplicated: Secondary | ICD-10-CM | POA: Insufficient documentation

## 2018-06-30 DIAGNOSIS — R102 Pelvic and perineal pain: Secondary | ICD-10-CM

## 2018-06-30 LAB — BASIC METABOLIC PANEL
Anion gap: 8 (ref 5–15)
BUN: 6 mg/dL (ref 6–20)
CO2: 27 mmol/L (ref 22–32)
Calcium: 8.5 mg/dL — ABNORMAL LOW (ref 8.9–10.3)
Chloride: 105 mmol/L (ref 98–111)
Creatinine, Ser: 0.65 mg/dL (ref 0.44–1.00)
GFR calc Af Amer: >60 mL/min (ref >60)
GFR calc non Af Amer: >60 mL/min (ref >60)
Glucose, Bld: 93 mg/dL (ref 70–99)
Potassium: 3.7 mmol/L (ref 3.5–5.1)
Sodium: 140 mmol/L (ref 135–145)

## 2018-06-30 LAB — CBC WITH DIFFERENTIAL/PLATELET
Abs Immature Granulocytes: 0.01 10*3/uL (ref 0.00–0.07)
BASOS PCT: 1 %
Basophils Absolute: 0 10*3/uL (ref 0.0–0.1)
Eosinophils Absolute: 0.2 10*3/uL (ref 0.0–0.5)
Eosinophils Relative: 4 %
HCT: 39.5 % (ref 36.0–46.0)
Hemoglobin: 12.3 g/dL (ref 12.0–15.0)
IMMATURE GRANULOCYTES: 0 %
Lymphocytes Relative: 44 %
Lymphs Abs: 2.8 10*3/uL (ref 0.7–4.0)
MCH: 29.5 pg (ref 26.0–34.0)
MCHC: 31.1 g/dL (ref 30.0–36.0)
MCV: 94.7 fL (ref 80.0–100.0)
MONOS PCT: 6 %
Monocytes Absolute: 0.4 10*3/uL (ref 0.1–1.0)
NEUTROS PCT: 45 %
Neutro Abs: 2.9 10*3/uL (ref 1.7–7.7)
Platelets: 287 10*3/uL (ref 150–400)
RBC: 4.17 MIL/uL (ref 3.87–5.11)
RDW: 12.7 % (ref 11.5–15.5)
WBC: 6.4 10*3/uL (ref 4.0–10.5)
nRBC: 0 % (ref 0.0–0.2)

## 2018-06-30 LAB — I-STAT BETA HCG BLOOD, ED (MC, WL, AP ONLY): I-stat hCG, quantitative: <5 m[IU]/mL (ref <5)

## 2018-06-30 MED ORDER — MEDROXYPROGESTERONE ACETATE 5 MG PO TABS: 10.0000 mg | ORAL_TABLET | Freq: Every day | ORAL | 0 refills | Status: DC

## 2018-06-30 NOTE — Discharge Instructions (Signed)
Provera as prescribed.  Follow up with [facility]' outpatient clinic in the next week, sooner if symptoms worsen or change.  Their contact information has been provided in this discharge summary for you to call and make these arrangements.

## 2018-06-30 NOTE — ED Triage Notes (Addendum)
Patient c/o vaginal bleeding x 1 month. Patient states she is now having clots and having to change tampons every 2 hours.

## 2018-06-30 NOTE — ED Notes (Signed)
US at bedside

## 2018-06-30 NOTE — ED Provider Notes (Signed)
[facility]-EMERGENCY DEPT Provider Note   CSN: [id number] Arrival date & time: 06/30/18  0808    History   Chief Complaint Chief Complaint  Patient presents with  . Vaginal Bleeding    HPI Ann Moran is a 33 y.o. female.     Patient is a 33 year old female with history of pregnancy-induced hypertension, UTIs.  She presents today for evaluation of vaginal bleeding.  She states that this is been worsening over the past month.  It started out as spotting, and has now progressed to where she is passing clots.  She does describe some pelvic cramping and discomfort.  She denies any fevers or chills.  She denies any lightheadedness or shortness of breath.  She was seen here 2 weeks ago and was told to follow-up with the health department, however they are unable to see her.  The history is provided by the patient.  Vaginal Bleeding  Quality:  Dark red Severity:  Moderate Onset quality:  Gradual Duration:  1 month Timing:  Constant Progression:  Worsening Chronicity:  New Menstrual history:  Irregular Possible pregnancy: no   Relieved by:  Nothing Worsened by:  Nothing   Past Medical History:  Diagnosis Date  . Chlamydia   . Depression    hx of meds, none currently- doing ok  . Infection due to trichomonas   . MRSA (methicillin resistant staph aureus) culture positive    Dec 2012  . Obesity   . Pregnancy induced hypertension   . Urinary tract infection     There are no active problems to display for this patient.   Past Surgical History:  Procedure Laterality Date  . CHOLECYSTECTOMY    . ECTOPIC PREGNANCY SURGERY       OB History    Gravida  2   Para  1   Term  0   Preterm  1   AB  1   Living  1     SAB      TAB      Ectopic  1   Multiple      Live Births  1            Home Medications    Prior to Admission medications   Medication Sig Start Date End Date Taking? Authorizing Provider  acetaminophen (TYLENOL)  500 MG tablet Take 1,000 mg by mouth daily as needed.    [provider]  albuterol (PROVENTIL HFA;VENTOLIN HFA) 108 (90 Base) MCG/ACT inhaler Inhale 1-2 puffs into the lungs every 6 (six) hours as needed for wheezing or shortness of breath. Patient not taking: Reported on 04/30/2018 03/04/18   Jeannie Fend, PA-C  cetirizine-pseudoephedrine (ZYRTEC-D) 5-120 MG tablet Take 1 tablet by mouth daily. Patient not taking: Reported on 04/30/2018 03/04/18   Army Melia A, PA-C  fluticasone [facility]) 50 MCG/ACT nasal spray Place 2 sprays into both nostrils daily. Patient not taking: Reported on 04/30/2018 02/25/18   Muthersbaugh, Dahlia Client, PA-C    Family History Family History  Problem Relation Age of Onset  . Hypertension Mother   . Diabetes Mother   . Asthma Father   . Hypertension Father   . Stroke Maternal Grandmother   . Diabetes Paternal Grandmother   . Hypertension Paternal Grandmother     Social History Social History   Tobacco Use  . Smoking status: Current Every Day Smoker    Packs/day: 0.50    Years: 9.00    Pack years: 4.50    Types:  Cigarettes  . Smokeless tobacco: Never Used  Substance Use Topics  . Alcohol use: Yes    Comment: occasional  . Drug use: No     Allergies   Patient has no known allergies.   Review of Systems Review of Systems  Genitourinary: Positive for vaginal bleeding.  All other systems reviewed and are negative.    Physical Exam Updated Vital Signs BP (!) 136/105 (BP Location: Right Arm)   Pulse (!) 102   Temp 98.2 F (36.8 C) (Oral)   Resp 20   Ht 5\' 9"  (1.753 m)   Wt 136.1 kg   LMP 06/30/2018   SpO2 100%   BMI 44.30 kg/m   Physical Exam Vitals signs and nursing note reviewed.  Constitutional:      General: She is not in acute distress.    Appearance: She is well-developed. She is not diaphoretic.  HENT:     Head: Normocephalic and atraumatic.  Neck:     Musculoskeletal: Normal range of motion and neck supple.    Cardiovascular:     Rate and Rhythm: Normal rate and regular rhythm.     Heart sounds: No murmur. No friction rub. No gallop.   Pulmonary:     Effort: Pulmonary effort is normal. No respiratory distress.     Breath sounds: Normal breath sounds. No wheezing.  Abdominal:     General: Bowel sounds are normal. There is no distension.     Palpations: Abdomen is soft.     Tenderness: There is no abdominal tenderness. There is no guarding or rebound.     Comments: There is mild suprapubic tenderness to palpation.  Musculoskeletal: Normal range of motion.  Skin:    General: Skin is warm and dry.  Neurological:     Mental Status: She is alert and oriented to person, place, and time.      ED Treatments / Results  Labs (all labs ordered are listed, but only abnormal results are displayed) Labs Reviewed  BASIC METABOLIC PANEL  CBC WITH DIFFERENTIAL/PLATELET  I-STAT BETA HCG BLOOD, ED ([facility], [facility], [facility] ONLY)  I-STAT BETA HCG BLOOD, ED ([facility], [facility], [facility] ONLY)    EKG None  Radiology No results found.  Procedures Procedures (including critical care time)  Medications Ordered in ED Medications - No data to display   Initial Impression / Assessment and Plan / ED Course  I have reviewed the triage vital signs and the nursing notes.  Pertinent labs & imaging results that were available during my care of the patient were reviewed by me and considered in my medical decision making (see chart for details).  Patient presents here with complaints of vaginal bleeding.  This has worsened over the past month.  She appears hemodynamically stable, is not actively hemorrhaging in the ER, and hemoglobin is normal.  Ultrasound shows no evidence for torsion, uterine fibroid, or other abnormality.  Patient will be discharged with Provera.  She is to follow-up with her GYN in the next week.  Final Clinical Impressions(s) / ED Diagnoses   Final diagnoses:  None    ED Discharge Orders    None        Geoffery Lyons, MD 06/30/18 1301

## 2018-06-30 NOTE — ED Notes (Signed)
US remains at bedside

## 2018-07-20 ENCOUNTER — Other Ambulatory Visit: Payer: Self-pay

## 2018-07-20 ENCOUNTER — Encounter (HOSPITAL_COMMUNITY): Payer: Self-pay | Admitting: *Deleted

## 2018-07-20 ENCOUNTER — Emergency Department (HOSPITAL_COMMUNITY)
Admission: EM | Admit: 2018-07-20 | Discharge: 2018-07-20 | Disposition: A | Discharge status: Home / Self Care | Payer: Self-pay | Attending: Emergency Medicine | Admitting: Emergency Medicine

## 2018-07-20 DIAGNOSIS — F17210 Nicotine dependence, cigarettes, uncomplicated: Secondary | ICD-10-CM | POA: Insufficient documentation

## 2018-07-20 DIAGNOSIS — Z79899 Other long term (current) drug therapy: Secondary | ICD-10-CM | POA: Insufficient documentation

## 2018-07-20 DIAGNOSIS — N939 Abnormal uterine and vaginal bleeding, unspecified: Secondary | ICD-10-CM | POA: Insufficient documentation

## 2018-07-20 LAB — CBC
HCT: 35.5 % — ABNORMAL LOW (ref 36.0–46.0)
Hemoglobin: 11.2 g/dL — ABNORMAL LOW (ref 12.0–15.0)
MCH: 30.1 pg (ref 26.0–34.0)
MCHC: 31.5 g/dL (ref 30.0–36.0)
MCV: 95.4 fL (ref 80.0–100.0)
Platelets: 308 10*3/uL (ref 150–400)
RBC: 3.72 MIL/uL — ABNORMAL LOW (ref 3.87–5.11)
RDW: 13.3 % (ref 11.5–15.5)
WBC: 8.2 10*3/uL (ref 4.0–10.5)
nRBC: 0 % (ref 0.0–0.2)

## 2018-07-20 LAB — I-STAT BETA HCG BLOOD, ED (MC, WL, AP ONLY): I-stat hCG, quantitative: <5 m[IU]/mL (ref <5)

## 2018-07-20 MED ORDER — MEGESTROL ACETATE 40 MG PO TABS
40.0000 mg | ORAL_TABLET | Freq: Every day | ORAL | Status: DC
  Filled 2018-07-20: qty 1

## 2018-07-20 MED ORDER — MEGESTROL ACETATE 40 MG PO TABS: 40.0000 mg | ORAL_TABLET | Freq: Every day | ORAL | 0 refills | Status: DC

## 2018-07-20 NOTE — ED Notes (Signed)
Pt getting dressed into gown. Pelvic exam set up at bedside. Will obtain labs once pt is off phone with pharmacy.

## 2018-07-20 NOTE — ED Triage Notes (Signed)
Pt c/o vaginal bleeding since the 2nd week of February.  Pt stated "I was seen here & given Provera but it has made it heavier.  I called [facility] and they told me if I could wait until the morning and be seen at the clinic.  If I couldn't to go to the ER."

## 2018-07-20 NOTE — Discharge Instructions (Addendum)
Return here as needed.  You will need to see the [facility] clinic as soon as possible.  The medication we are giving you is a different medication than you are prescribed previously.  Your testing here today did not show any significant abnormalities.

## 2018-07-20 NOTE — ED Provider Notes (Signed)
[facility]-EMERGENCY DEPT Provider Note   CSN: [id number] Arrival date & time: 07/20/18  2020    History   Chief Complaint Chief Complaint  Patient presents with  . Vaginal Bleeding    HPI Ann Moran is a 33 y.o. female.     HPI Patient presents to the emergency department with vaginal bleeding over the last month.  She was seen here in the emergency department beginning of this month.  Patient states that she continues to bleed despite taking Provera.  The patient denies chest pain, shortness of breath, headache,blurred vision, neck pain, fever, cough, weakness, numbness, dizziness, anorexia, edema, abdominal pain, nausea, vomiting, diarrhea, rash, back pain, dysuria, hematemesis, bloody stool, near syncope, or syncope Past Medical History:  Diagnosis Date  . Chlamydia   . Depression    hx of meds, none currently- doing ok  . Infection due to trichomonas   . MRSA (methicillin resistant staph aureus) culture positive    Dec 2012  . Obesity   . Pregnancy induced hypertension   . Urinary tract infection     There are no active problems to display for this patient.   Past Surgical History:  Procedure Laterality Date  . CHOLECYSTECTOMY    . ECTOPIC PREGNANCY SURGERY       OB History    Gravida  2   Para  1   Term  0   Preterm  1   AB  1   Living  1     SAB      TAB      Ectopic  1   Multiple      Live Births  1            Home Medications    Prior to Admission medications   Medication Sig Start Date End Date Taking? Authorizing Provider  acetaminophen (TYLENOL) 500 MG tablet Take 1,000 mg by mouth daily as needed for mild pain, moderate pain or headache.    Yes [provider]  diphenhydrAMINE (BENADRYL) 25 MG tablet Take 50 mg by mouth every 6 (six) hours as needed for itching or allergies.   Yes [provider]  albuterol (PROVENTIL HFA;VENTOLIN HFA) 108 (90 Base) MCG/ACT inhaler Inhale 1-2 puffs  into the lungs every 6 (six) hours as needed for wheezing or shortness of breath. Patient not taking: Reported on 04/30/2018 03/04/18   Jeannie Fend, PA-C  cetirizine-pseudoephedrine (ZYRTEC-D) 5-120 MG tablet Take 1 tablet by mouth daily. Patient not taking: Reported on 04/30/2018 03/04/18   Army Melia A, PA-C  fluticasone [facility]) 50 MCG/ACT nasal spray Place 2 sprays into both nostrils daily. Patient not taking: Reported on 04/30/2018 02/25/18   Muthersbaugh, Dahlia Client, PA-C  medroxyPROGESTERone (PROVERA) 5 MG tablet Take 2 tablets (10 mg total) by mouth daily. Patient not taking: Reported on 07/20/2018 06/30/18   Geoffery Lyons, MD  megestrol (MEGACE) 40 MG tablet Take 1 tablet (40 mg total) by mouth daily. 07/20/18   Charlestine Night, PA-C    Family History Family History  Problem Relation Age of Onset  . Hypertension Mother   . Diabetes Mother   . Asthma Father   . Hypertension Father   . Stroke Maternal Grandmother   . Diabetes Paternal Grandmother   . Hypertension Paternal Grandmother     Social History Social History   Tobacco Use  . Smoking status: Current Every Day Smoker    Packs/day: 0.50    Years: 9.00    Pack years:  4.50    Types: Cigarettes  . Smokeless tobacco: Never Used  Substance Use Topics  . Alcohol use: Yes    Comment: occasional  . Drug use: No     Allergies   Patient has no known allergies.   Review of Systems Review of Systems All other systems negative except as documented in the HPI. All pertinent positives and negatives as reviewed in the HPI.  Physical Exam Updated Vital Signs BP (!) 166/107 (BP Location: Left Wrist)   Pulse 93   Temp 98 F (36.7 C) (Oral)   Resp 18   Ht 5\' 9"  (1.753 m)   Wt 127 kg   LMP 06/05/2018 (Exact Date)   SpO2 98%   BMI 41.35 kg/m   Physical Exam Vitals signs and nursing note reviewed.  Constitutional:      General: She is not in acute distress.    Appearance: She is well-developed.  HENT:     Head:  Normocephalic and atraumatic.  Eyes:     Pupils: Pupils are equal, round, and reactive to light.  Neck:     Musculoskeletal: Normal range of motion and neck supple.  Cardiovascular:     Rate and Rhythm: Normal rate and regular rhythm.     Heart sounds: Normal heart sounds. No murmur. No friction rub. No gallop.   Pulmonary:     Effort: Pulmonary effort is normal. No respiratory distress.     Breath sounds: Normal breath sounds. No wheezing.  Abdominal:     General: Bowel sounds are normal. There is no distension.     Palpations: Abdomen is soft.     Tenderness: There is no abdominal tenderness.  Skin:    General: Skin is warm and dry.     Capillary Refill: Capillary refill takes less than 2 seconds.     Findings: No erythema or rash.  Neurological:     Mental Status: She is alert and oriented to person, place, and time.     Motor: No abnormal muscle tone.     Coordination: Coordination normal.  Psychiatric:        Behavior: Behavior normal.      ED Treatments / Results  Labs (all labs ordered are listed, but only abnormal results are displayed) Labs Reviewed  CBC - Abnormal; Notable for the following components:      Result Value   RBC 3.72 (*)    Hemoglobin 11.2 (*)    HCT 35.5 (*)    All other components within normal limits  I-STAT BETA HCG BLOOD, ED ([facility], [facility], [facility] ONLY)    EKG None  Radiology No results found.  Procedures Procedures (including critical care time)  Medications Ordered in ED Medications  megestrol (MEGACE) tablet 40 mg (has no administration in time range)     Initial Impression / Assessment and Plan / ED Course  I have reviewed the triage vital signs and the nursing notes.  Pertinent labs & imaging results that were available during my care of the patient were reviewed by me and considered in my medical decision making (see chart for details).      Patient's hemoglobin is not substantially lower than previous.  I reviewed her previous  visit where she had an ultrasound done as well.  I feel that the patient needs follow-up with GYN.  She has spoken with them.  She was prescribed Megace here in the emergency department.  Patient is advised to return for any worsening in her condition.  Final Clinical Impressions(s) / ED Diagnoses   Final diagnoses:  Abnormal vaginal bleeding    ED Discharge Orders         Ordered    megestrol (MEGACE) 40 MG tablet  Daily     07/20/18 2151           Charlestine Night, PA-C 07/20/18 2323    Lorre Nick, MD 07/22/18 1407

## 2018-07-20 NOTE — ED Notes (Addendum)
Pt resting in bed. Given blanket. Waiting on results and provider.

## 2018-07-20 NOTE — ED Notes (Signed)
ED PA at bedside

## 2018-12-06 ENCOUNTER — Ambulatory Visit (INDEPENDENT_AMBULATORY_CARE_PROVIDER_SITE_OTHER)
Admission: RE | Admit: 2018-12-06 | Discharge: 2018-12-06 | Disposition: A | Discharge status: Home / Self Care | Payer: Medicaid Other | Source: Ambulatory Visit

## 2018-12-06 DIAGNOSIS — H00014 Hordeolum externum left upper eyelid: Secondary | ICD-10-CM | POA: Diagnosis not present

## 2018-12-06 NOTE — ED Provider Notes (Signed)
Virtual Visit via Video Note:  Ann Moran  initiated request for Telemedicine visit with [facility] team. I connected with Ann Moran  on 12/06/2018 at 8:39 AM  for a synchronized telemedicine visit using a video enabled HIPPA compliant telemedicine application. I verified that I am speaking with Ann Moran  using two identifiers. Ann Hall-Potvin, PA-C  was physically located in a [place] [facility] site and Ann Moran was located at a different location.   The limitations of evaluation and management by telemedicine as well as the availability of in-person appointments were discussed. Patient was informed that she  may incur a bill ( including co-pay) for this virtual visit encounter. Ann Moran  expressed understanding and gave verbal consent to proceed with virtual visit.     History of Present Illness:Ann Moran  is a 33 y.o. female presents with 3-day course of left upper eyelid swelling and pain.  Patient states that she thought she had pinkeye over a week ago, so used her sister's ofloxacin drops 3 times daily x1 week with resolution of orbital discoloration, discharge.  Patient states thereafter she developed upper eyelid swelling.  Patient thinks that she may have gotten some dust in her eye while driving around that time, though does not have foreign body sensation at this time.  Patient denies eye discharge, change in vision.  Has used OTC dry eyedrops with moderate relief of symptoms.  Patient denies symptoms in her right eye.    Past Medical History:  Diagnosis Date  . Chlamydia   . Depression    hx of meds, none currently- doing ok  . Infection due to trichomonas   . MRSA (methicillin resistant staph aureus) culture positive    Dec 2012  . Obesity   . Pregnancy induced hypertension   . Urinary tract infection     No Known Allergies      Observations/Objective: 33 year old female sitting in no acute distress.  Patient is  able to speak in full sentences without coughing, sneezing, wheezing.  Patient's left upper eyelid mildly edematous as compared to right without erythema, scleral icterus, discharge.  Patient able to palpate upper lid which is tender on medial aspect with small "ball ".  Patient denies foreign body sensation, pain with blinking.  EOMs intact.  Assessment and Plan: History and reported physical consistent with stye.  Will treat with hot compresses, OTC analgesia.  Follow Up Instructions: Return precautions discussed, patient verbalized understanding and is agreeable to plan.   I discussed the assessment and treatment plan with the patient. The patient was provided an opportunity to ask questions and all were answered. The patient agreed with the plan and demonstrated an understanding of the instructions.   The patient was advised to call back or seek an in-person evaluation if the symptoms worsen or if the condition fails to improve as anticipated.  I provided 15 minutes of non-face-to-face time during this encounter.    [place], PA-C  12/06/2018 8:39 AM        Moran, [place], PA-C 12/06/18 0957

## 2018-12-06 NOTE — Discharge Instructions (Signed)
Keep the area clean and dry. Use hot compresses for 5 minutes 3-4 times a day. Return if you develop changes in vision, worsening pain, redness, eye swelling.

## 2018-12-25 ENCOUNTER — Ambulatory Visit (INDEPENDENT_AMBULATORY_CARE_PROVIDER_SITE_OTHER)
Admission: RE | Admit: 2018-12-25 | Discharge: 2018-12-26 | Disposition: A | Discharge status: Home / Self Care | Payer: Medicaid Other | Source: Ambulatory Visit

## 2018-12-25 DIAGNOSIS — F17200 Nicotine dependence, unspecified, uncomplicated: Secondary | ICD-10-CM

## 2018-12-25 DIAGNOSIS — R059 Cough, unspecified: Secondary | ICD-10-CM

## 2018-12-25 DIAGNOSIS — R05 Cough: Secondary | ICD-10-CM | POA: Diagnosis not present

## 2018-12-25 MED ORDER — ALBUTEROL SULFATE HFA 108 (90 BASE) MCG/ACT IN AERS: 1.0000 | INHALATION_SPRAY | Freq: Four times a day (QID) | RESPIRATORY_TRACT | 1 refills | Status: DC | PRN

## 2018-12-25 MED ORDER — BENZONATATE 100 MG PO CAPS: 200.0000 mg | ORAL_CAPSULE | Freq: Three times a day (TID) | ORAL | 0 refills | Status: DC

## 2018-12-25 NOTE — ED Provider Notes (Signed)
Virtual Visit via Video Note:  Ann Moran  initiated request for Telemedicine visit with [facility] team. I connected with Ann Moran  on 12/25/2018 at 8:37 AM  for a synchronized telemedicine visit using a video enabled HIPPA compliant telemedicine application. I verified that I am speaking with Ann Moran  using two identifiers. Orvan July, NP  was physically located in a [facility]gent care site and Ann Moran was located at a different location.   The limitations of evaluation and management by telemedicine as well as the availability of in-person appointments were discussed. Patient was informed that she  may incur a bill ( including co-pay) for this virtual visit encounter. Ann Moran  expressed understanding and gave verbal consent to proceed with virtual visit.     History of Present Illness:Ann Moran  is a 33 y.o. female presents with cough. This has been constant over the past week. Taking mucinex and tessalon and this is helping with the cough. She is out of the tessalon pearls. She had these from previous illness. No fever. No sore throat. Niece has been sick also with similar symptoms. No Asthma. No COVID exposure. She has also been using inhaler. Current smoker. Hx of bronchitis  Past Medical History:  Diagnosis Date  . Chlamydia   . Depression    hx of meds, none currently- doing ok  . Infection due to trichomonas   . MRSA (methicillin resistant staph aureus) culture positive    Dec 2012  . Obesity   . Pregnancy induced hypertension   . Urinary tract infection     No Known Allergies      Observations/Objective: VITALS: Per patient if applicable, see vitals. GENERAL: Alert, appears well and in no acute distress. HEENT: Atraumatic, conjunctiva clear, no obvious abnormalities on inspection of external nose and ears. NECK: Normal movements of the head and neck. CARDIOPULMONARY: No increased WOB. Speaking in clear sentences. I:E  ratio WNL.  MS: Moves all visible extremities without noticeable abnormality. PSYCH: Pleasant and cooperative, well-groomed. Speech normal rate and rhythm. Affect is appropriate. Insight and judgement are appropriate. Attention is focused, linear, and appropriate.  NEURO: CN grossly intact. Oriented as arrived to appointment on time with no prompting. Moves both UE equally.  SKIN: No obvious lesions, wounds, erythema, or cyanosis noted on face or hands.    Assessment and Plan: will refill the tessalon pearls and inhaler to treat what is most likely viral bronchitis. She can also continue the mucinex.     Follow Up Instructions:Follow up as needed for continued or worsening symptoms     I discussed the assessment and treatment plan with the patient. The patient was provided an opportunity to ask questions and all were answered. The patient agreed with the plan and demonstrated an understanding of the instructions.   The patient was advised to call back or seek an in-person evaluation if the symptoms worsen or if the condition fails to improve as anticipated.     Orvan July, NP  12/25/2018 8:37 AM         Orvan July, NP 12/25/18 1330

## 2018-12-25 NOTE — Discharge Instructions (Signed)
Take the [facility] as prescribed. You can continue the Mucinex. Albuterol inhaler with 1 to 2 puffs every 6 hours as needed for wheezing, shortness of breath. Follow up as needed for continued or worsening symptoms

## 2019-01-01 ENCOUNTER — Telehealth: Payer: Medicaid Other | Admitting: Physician Assistant

## 2019-01-01 DIAGNOSIS — H00014 Hordeolum externum left upper eyelid: Secondary | ICD-10-CM

## 2019-01-01 NOTE — Progress Notes (Signed)
We are sorry that you are not feeling well. Here is how we plan to help!  Based on what you have shared with me it looks like you have a stye.  A stye is an inflammation of the eyelid.  It is often a red, painful lump near the edge of the eyelid that may look like a boil or a pimple.  A stye develops when an infection occurs at the base of an eyelash.   We have made appropriate suggestions for you based upon your presentation: Simple styes can be treated without medical intervention.  Most styes either resolve spontaneously or resolve with simple home treatment by applying warm compresses or heated washcloth to the stye for about 10-15 minutes three to four times a day. This causes the stye to drain and resolve.  HOME CARE:   Wash your hands often!  Let the stye open on its own. Don't squeeze or open it.  Don't rub your eyes. This can irritate your eyes and let in bacteria.  If you need to touch your eyes, wash your hands first.  Don't wear eye makeup or contact lenses until the area has healed.  GET HELP RIGHT AWAY IF:   Your symptoms do not improve.  You develop blurred or loss of vision.  Your symptoms worsen (increased discharge, pain or redness).  Thank you for choosing an e-visit.  Your e-visit answers were reviewed by a board certified advanced clinical practitioner to complete your personal care plan.  Depending upon the condition, your plan could have included both over the counter or prescription medications.  Please review your pharmacy choice.  Make sure the pharmacy is open so you can pick up prescription now.  If there is a problem, you may contact your provider through [employer] and have the prescription routed to another pharmacy.    Your safety is important to [place].  If you have drug allergies check your prescription carefully.  For the next 24 hours you can use MyChart to ask questions about today's visit, request a non-urgent call back, or ask for a work or  school excuse.  You will get an email in the next two days asking about your experience.  I hope you that your e-visit has been valuable and will speed your recovery.   I spent 5 minutes on this patient

## 2019-01-14 ENCOUNTER — Encounter: Payer: Self-pay | Admitting: Critical Care Medicine

## 2019-01-14 ENCOUNTER — Ambulatory Visit: Payer: Medicaid Other | Attending: Critical Care Medicine | Admitting: Critical Care Medicine

## 2019-01-14 DIAGNOSIS — R05 Cough: Secondary | ICD-10-CM

## 2019-01-14 DIAGNOSIS — K219 Gastro-esophageal reflux disease without esophagitis: Secondary | ICD-10-CM | POA: Diagnosis not present

## 2019-01-14 DIAGNOSIS — N925 Other specified irregular menstruation: Secondary | ICD-10-CM

## 2019-01-14 DIAGNOSIS — N938 Other specified abnormal uterine and vaginal bleeding: Secondary | ICD-10-CM

## 2019-01-14 DIAGNOSIS — R053 Chronic cough: Secondary | ICD-10-CM | POA: Insufficient documentation

## 2019-01-14 DIAGNOSIS — N939 Abnormal uterine and vaginal bleeding, unspecified: Secondary | ICD-10-CM | POA: Insufficient documentation

## 2019-01-14 DIAGNOSIS — N926 Irregular menstruation, unspecified: Secondary | ICD-10-CM | POA: Insufficient documentation

## 2019-01-14 DIAGNOSIS — Z6841 Body Mass Index (BMI) 40.0 and over, adult: Secondary | ICD-10-CM

## 2019-01-14 MED ORDER — PANTOPRAZOLE SODIUM 40 MG PO TBEC: 40.0000 mg | DELAYED_RELEASE_TABLET | Freq: Every day | ORAL | 3 refills | Status: DC

## 2019-01-14 MED ORDER — BENZONATATE 100 MG PO CAPS: 200.0000 mg | ORAL_CAPSULE | Freq: Three times a day (TID) | ORAL | 0 refills | Status: DC

## 2019-01-14 NOTE — Patient Instructions (Signed)

## 2019-01-14 NOTE — Progress Notes (Signed)
Patient verified DOB Patient has eaten today Patient has not taken medication today. Patient complains of mid to upper back pain scaled at an 8 for chronic pain. Patient complains of cough being present and increases in the night.  Patient denies any fevers, N/V, diarrhea. No one else in the home has symptoms.

## 2019-01-14 NOTE — Progress Notes (Signed)
Patient ID: Ann Moran, female   DOB: Feb 19, 1986, 33 y.o.   MRN: [id number]  Virtual Visit via Telephone Note  I connected with Ann Moran on 01/14/19 at 11:00 AM EDT by telephone and verified that I am speaking with the correct person using two identifiers.   Consent:  I discussed the limitations, risks, security and privacy concerns of performing an evaluation and management service by telephone and the availability of in person appointments. I also discussed with the patient that there may be a patient responsible charge related to this service. The patient expressed understanding and agreed to proceed.  Location of patient: Patient at home  Location of provider: I was in my office  Persons participating in the televisit with the patient.   No one else on the call     History of Present Illness: This is a 33 year old female who is here post emergency room visit and is seen by way of a telephone telemedicine visit.  The patient was in the emergency room recently with productive cough that is been off and on worse with nocturnal coughing but improved with Tessalon.  Initially had productive of brown-yellow mucus now is resolved.  There is no fever no ear ache there had been shortness of breath and some wheezing initially.  Mucinex and benzonatate did help.  She does have a long-term high level reflux that she complains of.  She smokes 10 cigarettes a day.  She has had cough in the past.  Another complaint is that of irregular periods with excess bleeding when she does have the.  This patient's been seen by gynecology in the past for dysmenorrhea.  She also has upper shoulder and back pain and weighs 318 pounds and has very enlarged breasts.     Below is documentation from the urgent care visit History of Present Illness:Ann Moran  is a 33 y.o. female presents with cough. This has been constant over the past week. Taking mucinex and tessalon and this is helping with the cough.  She is out of the tessalon pearls. She had these from previous illness. No fever. No sore throat. Niece has been sick also with similar symptoms. No Asthma. No COVID exposure. She has also been using inhaler. Current smoker. Hx of bronchitis      Past Medical History:  Diagnosis Date  . Chlamydia   . Depression    hx of meds, none currently- doing ok  . Infection due to trichomonas   . MRSA (methicillin resistant staph aureus) culture positive    Dec 2012  . Obesity   . Pregnancy induced hypertension   . Urinary tract infection     No Known Allergies   Assessment and Plan: will refill the tessalon pearls and inhaler to treat what is most likely viral bronchitis. She can also continue the mucinex.   Constitutional:   No  weight loss, night sweats,  Fevers, chills, fatigue, lassitude. HEENT:   No headaches,  Difficulty swallowing,  Tooth/dental problems,  Sore throat,                No sneezing, itching, ear ache, nasal congestion, post nasal drip,   CV:  No chest pain,  Orthopnea, PND, swelling in lower extremities, anasarca, dizziness, palpitations  GI   heartburn, indigestion, abdominal pain, nausea, vomiting, diarrhea, change in bowel habits, loss of appetite  Resp: No shortness of breath with exertion or at rest.  No excess mucus,  productive cough,   non-productive cough,  No coughing up of blood.  No change in color of mucus.   wheezing.  No chest wall deformity  Skin: no rash or lesions.  GU: no dysuria, change in color of urine, no urgency or frequency.  No flank pain.  MS:  No joint pain or swelling.  No decreased range of motion.  No back pain.  Psych:  No change in mood or affect. No depression or anxiety.  No memory loss.     Observations/Objective: No observations as this was a telephone visit  Assessment and Plan: #1 cyclic cough likely on the basis of smoking induced chronic bronchitis with associated potential asthma and exacerbated by reflux  disease  Plan here is to prescribe Protonix 40 mg daily and re-prescribe benzonatate as needed for cough  We will get this patient back into the office for short-term follow-up  #2 dysmenorrhea and irregular periods  Will make a referral to the women's clinic for follow-up  #3 back pain  I will send the patient a series of exercises for the back pain  #4 tobacco use  I gave the patient smoking cessation counseling during this visit and indicated she will need to completely quit smoking if she wants the cough to be completely resolved  Follow Up Instructions:   The patient knows a follow-up visit will be arranged I discussed the assessment and treatment plan with the patient. The patient was provided an opportunity to ask questions and all were answered. The patient agreed with the plan and demonstrated an understanding of the instructions.   The patient was advised to call back or seek an in-person evaluation if the symptoms worsen or if the condition fails to improve as anticipated.  I provided 30 minutes of non-face-to-face time during this encounter  including  median intraservice time , review of notes, labs, imaging, medications  and explaining diagnosis and management to the patient .    Shan Levans, MD

## 2019-02-16 ENCOUNTER — Encounter: Payer: Self-pay | Admitting: Family Medicine

## 2019-02-16 ENCOUNTER — Other Ambulatory Visit: Payer: Self-pay

## 2019-02-16 ENCOUNTER — Ambulatory Visit (INDEPENDENT_AMBULATORY_CARE_PROVIDER_SITE_OTHER): Payer: Medicaid Other | Admitting: Family Medicine

## 2019-02-16 VITALS — BP 133/94 | HR 117 | Wt 324.0 lb

## 2019-02-16 DIAGNOSIS — I10 Essential (primary) hypertension: Secondary | ICD-10-CM | POA: Diagnosis not present

## 2019-02-16 DIAGNOSIS — N914 Secondary oligomenorrhea: Secondary | ICD-10-CM

## 2019-02-16 DIAGNOSIS — Z6841 Body Mass Index (BMI) 40.0 and over, adult: Secondary | ICD-10-CM

## 2019-02-16 NOTE — Progress Notes (Signed)
   Subjective:    Patient ID: Ann Moran, female    DOB: 30-Mar-1986, 33 y.o.   MRN: [id number]  HPI 33 year old [id number] who presents with oligomenorrhea.  Patient states that she has periods twice a year, which has been going on for the past 10 or 11 years.  Her pregnancy was in 2009 and ended in a vaginal delivery.  She had a Nexplanon for birth control for a year, during which she gained approximately 30 pounds.  She had no menses during that time.  After it was removed, she continued to gain weight and had overall 100 pound weight gain in the past 10 years.  Will bleed between 2 and 10 days with bleeding or passing clots just during urination.  She does have thick hair growth on her neck and chin.  Last Pap was in 2009  I have reviewed the patients past medical, family, and social history.  I have reviewed the patient's medication list and allergies.    Review of Systems     Objective:   Physical Exam Constitutional:      Appearance: Normal appearance.  HENT:     Head: Normocephalic and atraumatic.  Cardiovascular:     Rate and Rhythm: Normal rate and regular rhythm.     Pulses: Normal pulses.     Heart sounds: No murmur. No gallop.   Pulmonary:     Effort: Pulmonary effort is normal. No respiratory distress.     Breath sounds: No stridor. No wheezing, rhonchi or rales.  Chest:     Chest wall: No tenderness.  Abdominal:     General: Abdomen is flat.     Palpations: Abdomen is soft.  Neurological:     Mental Status: She is alert.           Assessment & Plan:  1. Secondary oligomenorrhea Likely PCOS. Will check labs as below. Discussed possible treatment options with metformin. Weight loss is central to PCOS.  - TSH - Hemoglobin G8T - LH - Follicle stimulating hormone - Estrogens, Total - TestT+TestF+SHBG - CBC - Comp Met (CMET)  2. BMI 40.0-44.9, adult ([place]) Recommended referral to weight loss specialist  3. Essential hypertension Stage 1  hypertension.

## 2019-02-16 NOTE — Progress Notes (Signed)
Bled for 2 days a few days ago just spotting and extreme cramping. No blood when wipe only during urine output before that Nov 2019.  haven't had a regular period since 2009 had the Nexplanon but since then no regular menses.

## 2019-02-16 NOTE — Patient Instructions (Signed)
Polycystic Ovarian Syndrome  Polycystic ovarian syndrome (PCOS) is a common hormonal disorder among women of reproductive age. In most women with PCOS, many small fluid-filled sacs (cysts) grow on the ovaries, and the cysts are not part of a normal menstrual cycle. PCOS can cause problems with your menstrual periods and make it difficult to get pregnant. It can also cause an increased risk of miscarriage with pregnancy. If it is not treated, PCOS can lead to serious health problems, such as diabetes and heart disease. What are the causes? The cause of PCOS is not known, but it may be the result of a combination of certain factors, such as:  Irregular menstrual cycle.  High levels of certain hormones (androgens).  Problems with the hormone that helps to control blood sugar (insulin resistance).  Certain genes. What increases the risk? This condition is more likely to develop in women who have a family history of PCOS. What are the signs or symptoms? Symptoms of PCOS may include:  Multiple ovarian cysts.  Infrequent periods or no periods.  Periods that are too frequent or too heavy.  Unpredictable periods.  Inability to get pregnant (infertility) because of not ovulating.  Increased growth of hair on the face, chest, stomach, back, thumbs, thighs, or toes.  Acne or oily skin. Acne may develop during adulthood, and it may not respond to treatment.  Pelvic pain.  Weight gain or obesity.  Patches of thickened and dark brown or black skin on the neck, arms, breasts, or thighs (acanthosis nigricans).  Excess hair growth on the face, chest, abdomen, or upper thighs (hirsutism). How is this diagnosed? This condition is diagnosed based on:  Your medical history.  A physical exam, including a pelvic exam. Your health care provider may look for areas of increased hair growth on your skin.  Tests, such as: ? Ultrasound. This may be used to examine the ovaries and the lining of the  uterus (endometrium) for cysts. ? Blood tests. These may be used to check levels of sugar (glucose), female hormone (testosterone), and female hormones (estrogen and progesterone) in your blood. How is this treated? There is no cure for PCOS, but treatment can help to manage symptoms and prevent more health problems from developing. Treatment varies depending on:  Your symptoms.  Whether you want to have a baby or whether you need birth control (contraception). Treatment may include nutrition and lifestyle changes along with:  Progesterone hormone to start a menstrual period.  Birth control pills to help you have regular menstrual periods.  Medicines to make you ovulate, if you want to get pregnant.  Medicine to reduce excessive hair growth.  Surgery, in severe cases. This may involve making small holes in one or both of your ovaries. This decreases the amount of testosterone that your body produces. Follow these instructions at home:  Take over-the-counter and prescription medicines only as told by your health care provider.  Follow a healthy meal plan. This can help you reduce the effects of PCOS. ? Eat a healthy diet that includes lean proteins, complex carbohydrates, fresh fruits and vegetables, low-fat dairy products, and healthy fats. Make sure to eat enough fiber.  If you are overweight, lose weight as told by your health care provider. ? Losing 10% of your body weight may improve symptoms. ? Your health care provider can determine how much weight loss is best for you and can help you lose weight safely.  Keep all follow-up visits as told by your health care provider.   This is important. Contact a health care provider if:  Your symptoms do not get better with medicine.  You develop new symptoms. This information is not intended to replace advice given to you by your health care provider. Make sure you discuss any questions you have with your health care provider. Document  Released: 08/03/2004 Document Revised: 03/22/2017 Document Reviewed: 09/25/2015 Elsevier Patient Education  2020 [employer] Inc[employer]   Diet for Polycystic Ovary Syndrome Polycystic ovary syndrome (PCOS) is a disorder of the chemicals (hormones) that regulate a woman's reproductive system, including monthly periods (menstruation). The condition causes important hormones to be out of balance. PCOS can:  Stop your periods or make them irregular.  Cause cysts to develop on your ovaries.  Make it difficult to get pregnant.  Stop your body from responding to the effects of insulin (insulin resistance). Insulin resistance can lead to obesity and diabetes. Changing what you eat can help you manage PCOS and improve your health. Following a balanced diet can help you lose weight and improve the way that your body uses insulin. What are tips for following this plan?  Follow a balanced diet for meals and snacks. Eat breakfast, lunch, dinner, and one or two snacks every day.  Include protein in each meal and snack.  Choose whole grains instead of products that are made with refined flour.  Eat a variety of foods.  Exercise regularly as told by your health care provider. Aim to do 30 or more minutes of exercise on most days of the week.  If you are overweight or obese: ? Pay attention to how many calories you eat. Cutting down on calories can help you lose weight. ? Work with your health care provider or a diet and nutrition specialist (dietitian) to figure out how many calories you need each day. What foods can I eat?  Fruits Include a variety of colors and types. All fruits are helpful for PCOS. Vegetables Include a variety of colors and types. All vegetables are helpful for PCOS. Grains Whole grains, such as whole wheat. Whole-grain breads, crackers, cereals, and pasta. Unsweetened oatmeal, bulgur, barley, quinoa, and brown rice. Tortillas made from corn or whole-wheat flour. Meats and other  proteins Low-fat (lean) proteins, such as fish, chicken, beans, eggs, and tofu. Dairy Low-fat dairy products, such as skim milk, cheese sticks, and yogurt. Beverages Low-fat or fat-free drinks, such as water, low-fat milk, sugar-free drinks, and small amounts of 100% fruit juice. Seasonings and condiments Ketchup. Mustard. Barbecue sauce. Relish. Low-fat or fat-free mayonnaise. Fats and oils Olive oil or canola oil. Walnuts and almonds. The items listed above may not be a complete list of recommended foods and beverages. Contact a dietitian for more options. What foods are not recommended? Foods that are high in calories or fat. Fried foods. Sweets. Products that are made from refined white flour, including white bread, pastries, white rice, and pasta. The items listed above may not be a complete list of foods and beverages to avoid. Contact a dietitian for more information. Summary  PCOS is a hormonal imbalance that affects a woman's reproductive system.  You can help to manage your PCOS by exercising regularly and eating a healthy, varied diet of vegetables, fruit, whole grains, low-fat (lean) protein, and low-fat dairy products.  Changing what you eat can improve the way that your body uses insulin, help your hormones reach normal levels, and help you lose weight. This information is not intended to replace advice given to you by your health   care provider. Make sure you discuss any questions you have with your health care provider. Document Released: 08/01/2015 Document Revised: 07/30/2018 Document Reviewed: 02/11/2017 Elsevier Patient Education  2020 Else[employer] Inc.  

## 2019-02-21 LAB — HEMOGLOBIN A1C
Est. average glucose Bld gHb Est-mCnc: 117 mg/dL
Hgb A1c MFr Bld: 5.7 % — ABNORMAL HIGH (ref 4.8–5.6)

## 2019-02-21 LAB — COMPREHENSIVE METABOLIC PANEL
ALT: 20 IU/L (ref 0–32)
AST: 15 IU/L (ref 0–40)
Albumin/Globulin Ratio: 1.7 (ref 1.2–2.2)
Albumin: 4.3 g/dL (ref 3.8–4.8)
Alkaline Phosphatase: 68 IU/L (ref 39–117)
BUN/Creatinine Ratio: 9 (ref 9–23)
BUN: 7 mg/dL (ref 6–20)
Bilirubin Total: 0.2 mg/dL (ref 0.0–1.2)
CO2: 24 mmol/L (ref 20–29)
Calcium: 9.4 mg/dL (ref 8.7–10.2)
Chloride: 103 mmol/L (ref 96–106)
Creatinine, Ser: 0.78 mg/dL (ref 0.57–1.00)
GFR calc Af Amer: 116 mL/min/{1.73_m2} (ref >59)
GFR calc non Af Amer: 100 mL/min/{1.73_m2} (ref >59)
Globulin, Total: 2.6 g/dL (ref 1.5–4.5)
Glucose: 129 mg/dL — ABNORMAL HIGH (ref 65–99)
Potassium: 4 mmol/L (ref 3.5–5.2)
Sodium: 141 mmol/L (ref 134–144)
Total Protein: 6.9 g/dL (ref 6.0–8.5)

## 2019-02-21 LAB — CBC
Hematocrit: 38.7 % (ref 34.0–46.6)
Hemoglobin: 13 g/dL (ref 11.1–15.9)
MCH: 29.3 pg (ref 26.6–33.0)
MCHC: 33.6 g/dL (ref 31.5–35.7)
MCV: 87 fL (ref 79–97)
Platelets: 348 10*3/uL (ref 150–450)
RBC: 4.43 x10E6/uL (ref 3.77–5.28)
RDW: 13.4 % (ref 11.7–15.4)
WBC: 7.1 10*3/uL (ref 3.4–10.8)

## 2019-02-21 LAB — TESTT+TESTF+SHBG
Sex Hormone Binding: 21.1 nmol/L — ABNORMAL LOW (ref 24.6–122.0)
Testosterone, Free: 3.9 pg/mL (ref 0.0–4.2)
Testosterone, Total, LC/MS: 60.8 ng/dL — ABNORMAL HIGH (ref 10.0–55.0)

## 2019-02-21 LAB — ESTROGENS, TOTAL: Estrogen: 140 pg/mL

## 2019-02-21 LAB — FOLLICLE STIMULATING HORMONE: FSH: 6.2 m[IU]/mL

## 2019-02-21 LAB — TSH: TSH: 1.55 u[IU]/mL (ref 0.450–4.500)

## 2019-02-21 LAB — LUTEINIZING HORMONE: LH: 8.1 m[IU]/mL

## 2019-02-24 ENCOUNTER — Other Ambulatory Visit: Payer: Self-pay

## 2019-02-24 ENCOUNTER — Encounter: Payer: Self-pay | Admitting: Critical Care Medicine

## 2019-02-24 ENCOUNTER — Ambulatory Visit: Payer: Medicaid Other | Attending: Critical Care Medicine | Admitting: Critical Care Medicine

## 2019-02-24 VITALS — BP 149/99 | HR 100 | Temp 98.4°F | Ht 69.0 in | Wt 329.6 lb

## 2019-02-24 DIAGNOSIS — K219 Gastro-esophageal reflux disease without esophagitis: Secondary | ICD-10-CM

## 2019-02-24 DIAGNOSIS — F17210 Nicotine dependence, cigarettes, uncomplicated: Secondary | ICD-10-CM

## 2019-02-24 DIAGNOSIS — E282 Polycystic ovarian syndrome: Secondary | ICD-10-CM | POA: Insufficient documentation

## 2019-02-24 DIAGNOSIS — Z23 Encounter for immunization: Secondary | ICD-10-CM | POA: Diagnosis not present

## 2019-02-24 DIAGNOSIS — Z79899 Other long term (current) drug therapy: Secondary | ICD-10-CM | POA: Diagnosis not present

## 2019-02-24 DIAGNOSIS — G8929 Other chronic pain: Secondary | ICD-10-CM | POA: Insufficient documentation

## 2019-02-24 DIAGNOSIS — Z833 Family history of diabetes mellitus: Secondary | ICD-10-CM | POA: Diagnosis not present

## 2019-02-24 DIAGNOSIS — R05 Cough: Secondary | ICD-10-CM | POA: Diagnosis not present

## 2019-02-24 DIAGNOSIS — Z823 Family history of stroke: Secondary | ICD-10-CM | POA: Insufficient documentation

## 2019-02-24 DIAGNOSIS — Z8249 Family history of ischemic heart disease and other diseases of the circulatory system: Secondary | ICD-10-CM | POA: Insufficient documentation

## 2019-02-24 DIAGNOSIS — I10 Essential (primary) hypertension: Secondary | ICD-10-CM

## 2019-02-24 DIAGNOSIS — Z6841 Body Mass Index (BMI) 40.0 and over, adult: Secondary | ICD-10-CM | POA: Diagnosis not present

## 2019-02-24 DIAGNOSIS — M542 Cervicalgia: Secondary | ICD-10-CM

## 2019-02-24 DIAGNOSIS — Z7984 Long term (current) use of oral hypoglycemic drugs: Secondary | ICD-10-CM | POA: Insufficient documentation

## 2019-02-24 DIAGNOSIS — R053 Chronic cough: Secondary | ICD-10-CM

## 2019-02-24 MED ORDER — NORETHIN ACE-ETH ESTRAD-FE 1-20 MG-MCG(24) PO TABS: 1.0000 | ORAL_TABLET | Freq: Every day | ORAL | 3 refills | Status: DC

## 2019-02-24 MED ORDER — IBUPROFEN 600 MG PO TABS: 600.0000 mg | ORAL_TABLET | Freq: Three times a day (TID) | ORAL | 0 refills | Status: DC | PRN

## 2019-02-24 MED ORDER — METFORMIN HCL 500 MG PO TABS: 500.0000 mg | ORAL_TABLET | Freq: Two times a day (BID) | ORAL | 5 refills | Status: DC

## 2019-02-24 MED ORDER — DULOXETINE HCL 30 MG PO CPEP: 30.0000 mg | ORAL_CAPSULE | Freq: Every day | ORAL | 3 refills | Status: DC

## 2019-02-24 MED ORDER — CYCLOBENZAPRINE HCL 10 MG PO TABS: 10.0000 mg | ORAL_TABLET | Freq: Three times a day (TID) | ORAL | 0 refills | Status: DC | PRN

## 2019-02-24 MED ORDER — AMLODIPINE BESYLATE 10 MG PO TABS: 10.0000 mg | ORAL_TABLET | Freq: Every day | ORAL | 3 refills | Status: DC

## 2019-02-24 MED ORDER — SULFAMETHOXAZOLE-TRIMETHOPRIM 800-160 MG PO TABS: 1.0000 | ORAL_TABLET | Freq: Two times a day (BID) | ORAL | 0 refills | Status: DC

## 2019-02-24 NOTE — Telephone Encounter (Signed)
Labs consistent with PCOS. Patient notified via [place]. Will start metformin and OCPs and weight loss. Patient does want to become pregnant at some point.  Truett Mainland, DO

## 2019-02-24 NOTE — Addendum Note (Signed)
Addended by: Truett Mainland on: 02/24/2019 04:35 PM   Modules accepted: Orders

## 2019-02-24 NOTE — Patient Instructions (Addendum)
Begin Cymbalta 30 mg daily  Use ibuprofen 600 mg 3 times daily as needed for neck pain  Continue Protonix daily and take this half hour before you eat and needs something  Tetanus vaccine was given  Begin amlodipine 10 mg daily for your elevated blood pressure this was sent to your pharmacy  X-rays of your chest and neck will be obtained and based on the neck x-rays I will likely order a physical therapy consult for you  A muscle relaxant was also sent to your pharmacy Flexeril to be taken 3 times daily as needed for back spasms   Primary care visit will be arranged for you to establish in the next 6 weeks  Follow reflux diet and diet below to lose weight   DASH Eating Plan DASH stands for "Dietary Approaches to Stop Hypertension." The DASH eating plan is a healthy eating plan that has been shown to reduce high blood pressure (hypertension). It may also reduce your risk for type 2 diabetes, heart disease, and stroke. The DASH eating plan may also help with weight loss. What are tips for following this plan?  General guidelines  Avoid eating more than 2,300 mg (milligrams) of salt (sodium) a day. If you have hypertension, you may need to reduce your sodium intake to 1,500 mg a day.  Limit alcohol intake to no more than 1 drink a day for nonpregnant women and 2 drinks a day for men. One drink equals 12 oz of beer, 5 oz of wine, or 1 oz of hard liquor.  Work with your health care provider to maintain a healthy body weight or to lose weight. Ask what an ideal weight is for you.  Get at least 30 minutes of exercise that causes your heart to beat faster (aerobic exercise) most days of the week. Activities may include walking, swimming, or biking.  Work with your health care provider or diet and nutrition specialist (dietitian) to adjust your eating plan to your individual calorie needs. Reading food labels   Check food labels for the amount of sodium per serving. Choose foods with  less than 5 percent of the Daily Value of sodium. Generally, foods with less than 300 mg of sodium per serving fit into this eating plan.  To find whole grains, look for the word "whole" as the first word in the ingredient list. Shopping  Buy products labeled as "low-sodium" or "no salt added."  Buy fresh foods. Avoid canned foods and premade or frozen meals. Cooking  Avoid adding salt when cooking. Use salt-free seasonings or herbs instead of table salt or sea salt. Check with your health care provider or pharmacist before using salt substitutes.  Do not fry foods. Cook foods using healthy methods such as baking, boiling, grilling, and broiling instead.  Cook with heart-healthy oils, such as olive, canola, soybean, or sunflower oil. Meal planning  Eat a balanced diet that includes: ? 5 or more servings of fruits and vegetables each day. At each meal, try to fill half of your plate with fruits and vegetables. ? Up to 6-8 servings of whole grains each day. ? Less than 6 oz of lean meat, poultry, or fish each day. A 3-oz serving of meat is about the same size as a deck of cards. One egg equals 1 oz. ? 2 servings of low-fat dairy each day. ? A serving of nuts, seeds, or beans 5 times each week. ? Heart-healthy fats. Healthy fats called Omega-3 fatty acids are found in foods  such as flaxseeds and coldwater fish, like sardines, salmon, and mackerel.  Limit how much you eat of the following: ? Canned or prepackaged foods. ? Food that is high in trans fat, such as fried foods. ? Food that is high in saturated fat, such as fatty meat. ? Sweets, desserts, sugary drinks, and other foods with added sugar. ? Full-fat dairy products.  Do not salt foods before eating.  Try to eat at least 2 vegetarian meals each week.  Eat more home-cooked food and less restaurant, buffet, and fast food.  When eating at a restaurant, ask that your food be prepared with less salt or no salt, if possible. What  foods are recommended? The items listed may not be a complete list. Talk with your dietitian about what dietary choices are best for you. Grains Whole-grain or whole-wheat bread. Whole-grain or whole-wheat pasta. Brown rice. Orpah Cobbatmeal. Quinoa. Bulgur. Whole-grain and low-sodium cereals. Pita bread. Low-fat, low-sodium crackers. Whole-wheat flour tortillas. Vegetables Fresh or frozen vegetables (raw, steamed, roasted, or grilled). Low-sodium or reduced-sodium tomato and vegetable juice. Low-sodium or reduced-sodium tomato sauce and tomato paste. Low-sodium or reduced-sodium canned vegetables. Fruits All fresh, dried, or frozen fruit. Canned fruit in natural juice (without added sugar). Meat and other protein foods Skinless chicken or [place]. Ground chicken or [place]. Pork with fat trimmed off. Fish and seafood. Egg whites. Dried beans, peas, or lentils. Unsalted nuts, nut butters, and seeds. Unsalted canned beans. Lean cuts of beef with fat trimmed off. Low-sodium, lean deli meat. Dairy Low-fat (1%) or fat-free (skim) milk. Fat-free, low-fat, or reduced-fat cheeses. Nonfat, low-sodium ricotta or cottage cheese. Low-fat or nonfat yogurt. Low-fat, low-sodium cheese. Fats and oils Soft margarine without trans fats. Vegetable oil. Low-fat, reduced-fat, or light mayonnaise and salad dressings (reduced-sodium). Canola, safflower, olive, soybean, and sunflower oils. Avocado. Seasoning and other foods Herbs. Spices. Seasoning mixes without salt. Unsalted popcorn and pretzels. Fat-free sweets. What foods are not recommended? The items listed may not be a complete list. Talk with your dietitian about what dietary choices are best for you. Grains Baked goods made with fat, such as croissants, muffins, or some breads. Dry pasta or rice meal packs. Vegetables Creamed or fried vegetables. Vegetables in a cheese sauce. Regular canned vegetables (not low-sodium or reduced-sodium). Regular canned tomato sauce and  paste (not low-sodium or reduced-sodium). Regular tomato and vegetable juice (not low-sodium or reduced-sodium). Rosita FirePickles. Olives. Fruits Canned fruit in a light or heavy syrup. Fried fruit. Fruit in cream or butter sauce. Meat and other protein foods Fatty cuts of meat. Ribs. Fried meat. Tomasa BlaseBacon. Sausage. Bologna and other processed lunch meats. Salami. Fatback. Hotdogs. Bratwurst. Salted nuts and seeds. Canned beans with added salt. Canned or smoked fish. Whole eggs or egg yolks. Chicken or [place] with skin. Dairy Whole or 2% milk, cream, and half-and-half. Whole or full-fat cream cheese. Whole-fat or sweetened yogurt. Full-fat cheese. Nondairy creamers. Whipped toppings. Processed cheese and cheese spreads. Fats and oils Butter. Stick margarine. Lard. Shortening. Ghee. Bacon fat. Tropical oils, such as coconut, palm kernel, or palm oil. Seasoning and other foods Salted popcorn and pretzels. Onion salt, garlic salt, seasoned salt, table salt, and sea salt. Worcestershire sauce. Tartar sauce. Barbecue sauce. Teriyaki sauce. Soy sauce, including reduced-sodium. Steak sauce. Canned and packaged gravies. Fish sauce. Oyster sauce. Cocktail sauce. Horseradish that you find on the shelf. Ketchup. Mustard. Meat flavorings and tenderizers. Bouillon cubes. Hot sauce and Tabasco sauce. Premade or packaged marinades. Premade or packaged taco seasonings. Relishes. Regular salad  dressings. Where to find more information:  National Heart, Lung, and [place]: https://wilson-eaton.com/  American Heart Association: www.heart.org Summary  The DASH eating plan is a healthy eating plan that has been shown to reduce high blood pressure (hypertension). It may also reduce your risk for type 2 diabetes, heart disease, and stroke.  With the DASH eating plan, you should limit salt (sodium) intake to 2,300 mg a day. If you have hypertension, you may need to reduce your sodium intake to 1,500 mg a day.  When on the DASH  eating plan, aim to eat more fresh fruits and vegetables, whole grains, lean proteins, low-fat dairy, and heart-healthy fats.  Work with your health care provider or diet and nutrition specialist (dietitian) to adjust your eating plan to your individual calorie needs. This information is not intended to replace advice given to you by your health care provider. Make sure you discuss any questions you have with your health care provider. Document Released: 03/29/2011 Document Revised: 03/22/2017 Document Reviewed: 04/02/2016 Elsevier Patient Education  2020 [place] for Gastroesophageal Reflux Disease, Adult When you have gastroesophageal reflux disease (GERD), the foods you eat and your eating habits are very important. Choosing the right foods can help ease your discomfort. Think about working with a nutrition specialist (dietitian) to help you make good choices. What are tips for following this plan?  Meals  Choose healthy foods that are low in fat, such as fruits, vegetables, whole grains, low-fat dairy products, and lean meat, fish, and poultry.  Eat small meals often instead of 3 large meals a day. Eat your meals slowly, and in a place where you are relaxed. Avoid bending over or lying down until 2-3 hours after eating.  Avoid eating meals 2-3 hours before bed.  Avoid drinking a lot of liquid with meals.  Cook foods using methods other than frying. Bake, grill, or broil food instead.  Avoid or limit: ? Chocolate. ? Peppermint or spearmint. ? Alcohol. ? Pepper. ? Black and decaffeinated coffee. ? Black and decaffeinated tea. ? Bubbly (carbonated) soft drinks. ? Caffeinated energy drinks and soft drinks.  Limit high-fat foods such as: ? Fatty meat or fried foods. ? Whole milk, cream, butter, or ice cream. ? Nuts and nut butters. ? Pastries, donuts, and sweets made with butter or shortening.  Avoid foods that cause symptoms. These foods may be different for  everyone. Common foods that cause symptoms include: ? Tomatoes. ? Oranges, lemons, and limes. ? Peppers. ? Spicy food. ? Onions and garlic. ? Vinegar. Lifestyle  Maintain a healthy weight. Ask your doctor what weight is healthy for you. If you need to lose weight, work with your doctor to do so safely.  Exercise for at least 30 minutes for 5 or more days each week, or as told by your doctor.  Wear loose-fitting clothes.  Do not smoke. If you need help quitting, ask your doctor.  Sleep with the head of your bed higher than your feet. Use a wedge under the mattress or blocks under the bed frame to raise the head of the bed. Summary  When you have gastroesophageal reflux disease (GERD), food and lifestyle choices are very important in easing your symptoms.  Eat small meals often instead of 3 large meals a day. Eat your meals slowly, and in a place where you are relaxed.  Limit high-fat foods such as fatty meat or fried foods.  Avoid bending over or lying down until 2-3 hours after  eating.  Avoid peppermint and spearmint, caffeine, alcohol, and chocolate. This information is not intended to replace advice given to you by your health care provider. Make sure you discuss any questions you have with your health care provider. Document Released: 10/09/2011 Document Revised: 07/31/2018 Document Reviewed: 05/15/2016 Elsevier Patient Education  2020 Elsevier Inc.  

## 2019-02-24 NOTE — Assessment & Plan Note (Addendum)
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

## 2019-02-24 NOTE — Assessment & Plan Note (Signed)
New diagnosis of essential hypertension  We will begin amlodipine 10 mg daily note thyroid function has been obtained and is normal

## 2019-02-24 NOTE — Assessment & Plan Note (Signed)
Gastroesophageal reflux disease ongoing  We went over with the patient a reflux diet and will continue Protonix 40 mg daily

## 2019-02-24 NOTE — Assessment & Plan Note (Signed)
Chronic neck pain with muscle spasticity of the posterior cervical muscles and upper back muscles  Plan will be to obtain films of the cervical spine  Continue relaxation exercises of the neck and upper shoulders  Begin Cymbalta 30 mg daily for the chronic pain  Add Flexeril 10 mg 3 times daily as needed and use ibuprofen 800 mg 3 times daily as needed for pain and discontinue Tylenol PM and Benadryl

## 2019-02-24 NOTE — Progress Notes (Signed)
Subjective:    Patient ID: Ann Moran, female    DOB: [birth year], 33 y.o.   MRN: [id number]  33 y.o.F  Here to establish for PCP and post ED visit for cough.   Note seen 12/2018 via phone visit as per below:  This is a 33 year old female who is here post emergency room visit and is seen by way of a telephone telemedicine visit.  The patient was in the emergency room recently with productive cough that is been off and on worse with nocturnal coughing but improved with Tessalon.  Initially had productive of brown-yellow mucus now is resolved.  There is no fever no ear ache there had been shortness of breath and some wheezing initially.  Mucinex and benzonatate did help.  She does have a long-term high level reflux that she complains of.  She smokes 10 cigarettes a day.  She has had cough in the past.  Another complaint is that of irregular periods with excess bleeding when she does have the.  This patient's been seen by gynecology in the past for dysmenorrhea.  She also has upper shoulder and back pain and weighs 318 pounds and has very enlarged breasts.     Below is documentation from the urgent care visit History of Present Illness:Jodie E Horstis a 33 y.o.femalepresents with cough. This has been constant over the past week. Taking mucinex and tessalonand this is helping with the cough.She is out of the tessalon pearls. She had these from previous illness. No fever. Nosore throat. Niece has been sick alsowith similar symptoms. No Asthma. No COVID exposure. She has also been using inhaler. Current smoker.Hx of bronchitis  02/24/2019 Since the last office visit that was a telephone visit the patient is seen today face-to-face.  She states her cough is improved however she still having daily reflux.  She is still smoking half pack a day of cigarettes.  She complains of pain in the neck and shoulders.  She has been seen by GYN and evaluation of her irregular menstrual cycle and has  been diagnosed with polycystic ovarian syndrome and is now on Metformin.  She is also on an oral antibiotic for bacterial vaginosis.  Note today blood pressure is quite elevated in the office and she is not had a diagnosis of hypertension previously.  She does increasing in weight.  She struggles to lose the weight.  See cough assessment below   Cough This is a chronic problem. The current episode started more than 1 month ago. The problem has been rapidly improving. The cough is non-productive. Associated symptoms include heartburn, shortness of breath and wheezing. Pertinent negatives include no chest pain, ear congestion, ear pain, headaches, hemoptysis, nasal congestion, postnasal drip, rhinorrhea or sore throat. The symptoms are aggravated by fumes. She has tried prescription cough suppressant for the symptoms. The treatment provided significant relief. Her past medical history is significant for bronchitis. There is no history of asthma, bronchiectasis, COPD or pneumonia.    Past Medical History:  Diagnosis Date  . Chlamydia   . Depression    hx of meds, none currently- doing ok  . Infection due to trichomonas   . MRSA (methicillin resistant staph aureus) culture positive    Dec 2012  . Obesity   . Pregnancy induced hypertension   . Urinary tract infection      Family History  Problem Relation Age of Onset  . Hypertension Mother   . Diabetes Mother   . Asthma Father   .  Hypertension Father   . Stroke Maternal Grandmother   . Diabetes Paternal Grandmother   . Hypertension Paternal Grandmother      Social History   Socioeconomic History  . Marital status: Single    Spouse name: Not on file  . Number of children: Not on file  . Years of education: Not on file  . Highest education level: Not on file  Occupational History  . Not on file  Social Needs  . Financial resource strain: Not on file  . Food insecurity    Worry: Not on file    Inability: Not on file  .  Transportation needs    Medical: Not on file    Non-medical: Not on file  Tobacco Use  . Smoking status: Current Every Day Smoker    Packs/day: 0.50    Years: 9.00    Pack years: 4.50    Types: Cigarettes  . Smokeless tobacco: Never Used  Substance and Sexual Activity  . Alcohol use: Yes    Comment: occasional  . Drug use: No  . Sexual activity: Yes    Birth control/protection: None  Lifestyle  . Physical activity    Days per week: Not on file    Minutes per session: Not on file  . Stress: Not on file  Relationships  . Social Herbalist on phone: Not on file    Gets together: Not on file    Attends religious service: Not on file    Active member of club or organization: Not on file    Attends meetings of clubs or organizations: Not on file    Relationship status: Not on file  . Intimate partner violence    Fear of current or ex partner: Not on file    Emotionally abused: Not on file    Physically abused: Not on file    Forced sexual activity: Not on file  Other Topics Concern  . Not on file  Social History Narrative  . Not on file     No Known Allergies   Outpatient Medications Prior to Visit  Medication Sig Dispense Refill  . albuterol (VENTOLIN HFA) 108 (90 Base) MCG/ACT inhaler Inhale 1-2 puffs into the lungs every 6 (six) hours as needed for wheezing or shortness of breath. 18 g 1  . metFORMIN (GLUCOPHAGE) 500 MG tablet Take 1 tablet (500 mg total) by mouth 2 (two) times daily with a meal. 60 tablet 5  . neomycin-polymyxin b-dexamethasone (MAXITROL) 3.5-10000-0.1 OINT APPLY 1 INCH INTO LOWER EYELIDS BEFORE BEDTIME    . pantoprazole (PROTONIX) 40 MG tablet Take 1 tablet (40 mg total) by mouth daily. 30 tablet 3  . PAZEO 0.7 % SOLN INSTILL 1 DROP IN BOTH EYES EVERY MORNING    . RESTASIS 0.05 % ophthalmic emulsion INSTILL 1 DROP  BID IN OU    . sulfamethoxazole-trimethoprim (BACTRIM DS) 800-160 MG tablet Take 1 tablet by mouth 2 (two) times daily. 6 tablet  0  . diphenhydrAMINE-APAP, sleep, 50-1000 MG/30ML LIQD Take by mouth.    Marland Kitchen acetaminophen (TYLENOL) 500 MG tablet Take 1,000 mg by mouth daily as needed for mild pain, moderate pain or headache.     . diphenhydrAMINE (BENADRYL) 25 MG tablet Take 50 mg by mouth every 6 (six) hours as needed for itching or allergies.     No facility-administered medications prior to visit.      Review of Systems  HENT: Negative for ear pain, postnasal drip, rhinorrhea and sore throat.  Respiratory: Positive for cough, shortness of breath and wheezing. Negative for hemoptysis.   Cardiovascular: Negative for chest pain.  Gastrointestinal: Positive for heartburn.  Neurological: Negative for headaches.       Objective:   Physical Exam  BP (!) 149/99 (BP Location: Left Arm, Patient Position: Sitting, Cuff Size: Large)   Pulse 100   Temp 98.4 F (36.9 C) (Oral)   Ht '5\' 9"'  (1.753 m)   Wt (!) 329 lb 9.6 oz (149.5 kg)   SpO2 98%   BMI 48.67 kg/m   Gen: Pleasant, obese, in no distress,  normal affect  ENT: No lesions,  mouth clear,  oropharynx clear, no postnasal drip  Neck: No JVD, no TMG, no carotid bruits  Lungs: No use of accessory muscles, no dullness to percussion, clear without rales or rhonchi  Cardiovascular: RRR, heart sounds normal, no murmur or gallops, no peripheral edema  Abdomen: soft and NT, no HSM,  BS normal  Musculoskeletal: No deformities, no cyanosis or clubbing Neck muscles are quite spastic and stiff,   neck shows adequate range of motion trapezius muscles also are spastic  Neuro: alert, non focal  Skin: Warm, no lesions or rashes   BMP Latest Ref Rng & Units 02/16/2019 06/30/2018 04/30/2018  Glucose 65 - 99 mg/dL 129(H) 93 90  BUN 6 - 20 mg/dL '7 6 8  ' Creatinine 0.57 - 1.00 mg/dL 0.78 0.65 0.59  BUN/Creat Ratio 9 - 23 9 - -  Sodium 134 - 144 mmol/L 141 140 139  Potassium 3.5 - 5.2 mmol/L 4.0 3.7 3.9  Chloride 96 - 106 mmol/L 103 105 104  CO2 20 - 29 mmol/L '24 27 27   ' Calcium 8.7 - 10.2 mg/dL 9.4 8.5(L) 8.6(L)   CBC Latest Ref Rng & Units 02/16/2019 07/20/2018 06/30/2018  WBC 3.4 - 10.8 x10E3/uL 7.1 8.2 6.4  Hemoglobin 11.1 - 15.9 g/dL 13.0 11.2(L) 12.3  Hematocrit 34.0 - 46.6 % 38.7 35.5(L) 39.5  Platelets 150 - 450 x10E3/uL 348 308 287   Hepatic Function Latest Ref Rng & Units 02/16/2019 04/30/2018 11/03/2015  Total Protein 6.0 - 8.5 g/dL 6.9 7.6 6.1(L)  Albumin 3.8 - 4.8 g/dL 4.3 4.3 3.5  AST 0 - 40 IU/L 15 14(L) 15  ALT 0 - 32 IU/L 20 15 12(L)  Alk Phosphatase 39 - 117 IU/L 68 46 45  Total Bilirubin 0.0 - 1.2 mg/dL 0.2 0.9 0.6         Assessment & Plan:  I personally reviewed all images and lab data in the [facility] system as well as any outside material available during this office visit and agree with the  radiology impressions.   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  Plan will be to continue Protonix daily and obtain a reflux diet  Check chest x-ray  Neck pain Chronic neck pain with muscle spasticity of the posterior cervical muscles and upper back muscles  Plan will be to obtain films of the cervical spine  Continue relaxation exercises of the neck and upper shoulders  Begin Cymbalta 30 mg daily for the chronic pain  Add Flexeril 10 mg 3 times daily as needed and use ibuprofen 800 mg  3 times daily as needed for pain and discontinue Tylenol PM and Benadryl    Diagnoses and all orders for this visit:  Chronic cough -     DG Chest 2 View; Future  Neck pain -     DG Cervical Spine Complete; Future  Morbid obesity with body mass index (BMI) of 45.0 to 49.9 in adult  [facility])  Gastroesophageal reflux disease without esophagitis  Essential hypertension  Other orders -     Tdap vaccine greater than or equal to 7yo IM -     DULoxetine (CYMBALTA) 30 MG capsule; Take 1 capsule (30 mg total) by mouth daily. -     ibuprofen (ADVIL) 600 MG tablet; Take 1 tablet (600 mg total) by mouth every 8 (eight) hours as needed. -     cyclobenzaprine (FLEXERIL) 10 MG tablet; Take 1 tablet (10 mg total) by mouth 3 (three) times daily as needed for muscle spasms. -     amLODipine (NORVASC) 10 MG tablet; Take 1 tablet (10 mg total) by mouth daily.    I had an extended discussion with the patient and or family lasting 10 minutes of a 25 minute visit including: Smoking cessation, weight loss, reflux diet  A tetanus vaccine was given

## 2019-02-27 ENCOUNTER — Encounter: Payer: Self-pay | Admitting: Critical Care Medicine

## 2019-03-04 ENCOUNTER — Other Ambulatory Visit: Payer: Self-pay

## 2019-03-04 ENCOUNTER — Ambulatory Visit (HOSPITAL_COMMUNITY)
Admission: RE | Admit: 2019-03-04 | Discharge: 2019-03-04 | Disposition: A | Discharge status: Home / Self Care | Payer: Medicaid Other | Source: Ambulatory Visit | Attending: Critical Care Medicine | Admitting: Critical Care Medicine

## 2019-03-04 DIAGNOSIS — M542 Cervicalgia: Secondary | ICD-10-CM | POA: Diagnosis present

## 2019-03-04 DIAGNOSIS — R05 Cough: Secondary | ICD-10-CM | POA: Insufficient documentation

## 2019-03-04 DIAGNOSIS — R053 Chronic cough: Secondary | ICD-10-CM

## 2019-03-05 NOTE — Telephone Encounter (Signed)
-----   Message from Elsie Stain, MD sent at 03/04/2019  3:15 PM EST ----- pls let ms Danielski know her cervical spine and Chest xray were NORMAL.   No change in treatments

## 2019-03-11 ENCOUNTER — Encounter: Payer: Self-pay | Admitting: Family Medicine

## 2019-03-11 ENCOUNTER — Other Ambulatory Visit: Payer: Self-pay

## 2019-03-11 ENCOUNTER — Ambulatory Visit: Payer: Medicaid Other | Attending: Family Medicine | Admitting: Family Medicine

## 2019-03-11 DIAGNOSIS — L680 Hirsutism: Secondary | ICD-10-CM | POA: Diagnosis not present

## 2019-03-11 DIAGNOSIS — E282 Polycystic ovarian syndrome: Secondary | ICD-10-CM | POA: Diagnosis not present

## 2019-03-11 MED ORDER — SPIRONOLACTONE 25 MG PO TABS: 25.0000 mg | ORAL_TABLET | Freq: Every day | ORAL | 2 refills | Status: DC

## 2019-03-11 NOTE — Progress Notes (Signed)
Patient has been called and DOB has been verified. Patient has been screened and transferred to PCP to start phone visit.    Face is breaking out around chin area, cheeks. Very itchy and painful.

## 2019-03-11 NOTE — Progress Notes (Signed)
Virtual Visit via Telephone Note  I connected with Ann Moran, on 03/11/2019 at 8:39 AM by telephone due to the COVID-19 pandemic and verified that I am speaking with the correct person using two identifiers.   Consent: I discussed the limitations, risks, security and privacy concerns of performing an evaluation and management service by telephone and the availability of in person appointments. I also discussed with the patient that there may be a patient responsible charge related to this service. The patient expressed understanding and agreed to proceed.   Location of Patient: Home  Location of Provider: Clinic   Persons participating in Telemedicine visit: Lania Zawistowski Far[place]-CMA Dr. Margarita Rana     History of Present Illness: 33 year old female with a history of hypertension seen by Dr. Joya Gaskins 2 weeks ago who presents today for an acute visit.  She complains of her face breaking out, itching, hair growing on her cheek and chin. She has PCOS, is followed by GYN currently on Metformin and Loestrin. She has used Bendadryl, switching her soaps. These lesion are big and painful on her face. Symptoms are concerning.  Past Medical History:  Diagnosis Date  . Chlamydia   . Depression    hx of meds, none currently- doing ok  . Infection due to trichomonas   . MRSA (methicillin resistant staph aureus) culture positive    Dec 2012  . Obesity   . Pregnancy induced hypertension   . Urinary tract infection    No Known Allergies  Current Outpatient Medications on File Prior to Visit  Medication Sig Dispense Refill  . albuterol (VENTOLIN HFA) 108 (90 Base) MCG/ACT inhaler Inhale 1-2 puffs into the lungs every 6 (six) hours as needed for wheezing or shortness of breath. 18 g 1  . amLODipine (NORVASC) 10 MG tablet Take 1 tablet (10 mg total) by mouth daily. 90 tablet 3  . cyclobenzaprine (FLEXERIL) 10 MG tablet Take 1 tablet (10 mg total) by mouth 3 (three) times daily  as needed for muscle spasms. 30 tablet 0  . DULoxetine (CYMBALTA) 30 MG capsule Take 1 capsule (30 mg total) by mouth daily. 30 capsule 3  . ibuprofen (ADVIL) 600 MG tablet Take 1 tablet (600 mg total) by mouth every 8 (eight) hours as needed. 30 tablet 0  . metFORMIN (GLUCOPHAGE) 500 MG tablet Take 1 tablet (500 mg total) by mouth 2 (two) times daily with a meal. 60 tablet 5  . Norethindrone Acetate-Ethinyl Estrad-FE (LOESTRIN 24 FE) 1-20 MG-MCG(24) tablet Take 1 tablet by mouth daily. 3 Package 3  . pantoprazole (PROTONIX) 40 MG tablet Take 1 tablet (40 mg total) by mouth daily. 30 tablet 3  . sulfamethoxazole-trimethoprim (BACTRIM DS) 800-160 MG tablet Take 1 tablet by mouth 2 (two) times daily. 6 tablet 0  . neomycin-polymyxin b-dexamethasone (MAXITROL) 3.5-10000-0.1 OINT APPLY 1 INCH INTO LOWER EYELIDS BEFORE BEDTIME    . PAZEO 0.7 % SOLN INSTILL 1 DROP IN BOTH EYES EVERY MORNING    . RESTASIS 0.05 % ophthalmic emulsion INSTILL 1 DROP  BID IN OU    . [DISCONTINUED] fluticasone (FLONASE) 50 MCG/ACT nasal spray Place 2 sprays into both nostrils daily. (Patient not taking: Reported on 04/30/2018) 9.9 g 2  . [DISCONTINUED] medroxyPROGESTERone (PROVERA) 5 MG tablet Take 2 tablets (10 mg total) by mouth daily. (Patient not taking: Reported on 07/20/2018) 14 tablet 0   No current facility-administered medications on file prior to visit.     Observations/Objective: Awake, alert, oriented x3 Not in acute  distress  Assessment and Plan: 1. PCOS (polycystic ovarian syndrome) Currently on Metformin and Loestrin Followed by GYN  2. Hirsutism We will, spironolactone-discussed side effects Her blood pressure at her last visit was 149/99 so this she will hold appointment with the spironolactone and last potassium was 4.0 Advised she would need a potassium check at her next visit next month with GYN. - spironolactone (ALDACTONE) 25 MG tablet; Take 1 tablet (25 mg total) by mouth daily.  Dispense: 30  tablet; Refill: 2   Follow Up Instructions: Keep previously scheduled appointment   I discussed the assessment and treatment plan with the patient. The patient was provided an opportunity to ask questions and all were answered. The patient agreed with the plan and demonstrated an understanding of the instructions.   The patient was advised to call back or seek an in-person evaluation if the symptoms worsen or if the condition fails to improve as anticipated.     I provided 9 minutes total of non-face-to-face time during this encounter including median intraservice time, reviewing previous notes, labs, imaging, medications, management and patient verbalized understanding.     Hoy Register, MD, FAAFP. [facility] [place], [place] [phone]   03/11/2019, 8:39 AM

## 2019-03-30 ENCOUNTER — Encounter: Payer: Self-pay | Admitting: Critical Care Medicine

## 2019-03-31 ENCOUNTER — Other Ambulatory Visit: Payer: Self-pay | Admitting: Critical Care Medicine

## 2019-03-31 DIAGNOSIS — M542 Cervicalgia: Secondary | ICD-10-CM

## 2019-03-31 DIAGNOSIS — M545 Low back pain, unspecified: Secondary | ICD-10-CM

## 2019-03-31 DIAGNOSIS — G8929 Other chronic pain: Secondary | ICD-10-CM

## 2019-03-31 MED ORDER — CYCLOBENZAPRINE HCL 10 MG PO TABS: 10.0000 mg | ORAL_TABLET | Freq: Three times a day (TID) | ORAL | 0 refills | Status: DC | PRN

## 2019-04-01 ENCOUNTER — Ambulatory Visit: Payer: Medicaid Other | Admitting: Physical Therapy

## 2019-04-06 ENCOUNTER — Ambulatory Visit: Payer: Medicaid Other | Attending: Critical Care Medicine | Admitting: Physical Therapy

## 2019-04-06 ENCOUNTER — Other Ambulatory Visit: Payer: Self-pay

## 2019-04-06 DIAGNOSIS — M4004 Postural kyphosis, thoracic region: Secondary | ICD-10-CM | POA: Diagnosis present

## 2019-04-06 DIAGNOSIS — M542 Cervicalgia: Secondary | ICD-10-CM | POA: Insufficient documentation

## 2019-04-06 DIAGNOSIS — M6281 Muscle weakness (generalized): Secondary | ICD-10-CM | POA: Diagnosis present

## 2019-04-06 DIAGNOSIS — G8929 Other chronic pain: Secondary | ICD-10-CM

## 2019-04-06 DIAGNOSIS — M545 Low back pain: Secondary | ICD-10-CM | POA: Diagnosis present

## 2019-04-06 NOTE — Therapy (Signed)
[facility] [address] [place], [place], [address] Phone: [phone]   Fax:  [phone]  Physical Therapy Evaluation  Patient Details  Name: Ann Moran MRN: [id number] Date of Birth: 11-01-1985 No data recorded  Encounter Date: 04/06/2019  PT End of Session - 04/06/19 1708    Visit Number  1    Number of Visits  4    Date for PT Re-Evaluation  06/01/19    Authorization Type  MCD submitted for 3 visits    PT Start Time  1558    PT Stop Time  1640    PT Time Calculation (min)  42 min    Activity Tolerance  Patient tolerated treatment well    Behavior During Therapy  [facility] for tasks assessed/performed       Past Medical History:  Diagnosis Date  . Chlamydia   . Depression    hx of meds, none currently- doing ok  . Infection due to trichomonas   . MRSA (methicillin resistant staph aureus) culture positive    Dec 2012  . Obesity   . Pregnancy induced hypertension   . Urinary tract infection     Past Surgical History:  Procedure Laterality Date  . CHOLECYSTECTOMY    . ECTOPIC PREGNANCY SURGERY      There were no vitals filed for this visit.   Subjective Assessment - 04/06/19 1601    Subjective  Pt arriving to therapy greater than 10 minutes late. Pt reporting 8/10 pain in her low back. Pt reporting her pain has been ongoing for at least 3 years. Pt also reporting pain in her neck and mid back. Pt reporting having to take sleeping pills to rest. Pt reporting feeling like her chest is pulling her neck and upper back down.    Pertinent History  HTN and DM per pt report, Depression    Diagnostic tests  x-ray of cervical spine was negative on 03/04/2019    Patient Stated Goals  Stop hurting,    Currently in Pain?  Yes    Pain Score  8     Pain Location  Back    Pain Orientation  Lower    Pain Descriptors / Indicators  Aching    Pain Type  Chronic pain    Pain Onset  More than a month ago    Pain Frequency   Intermittent    Aggravating Factors   bending, lifting, putting on shoes    Pain Relieving Factors  lumbar corset brace    Effect of Pain on Daily Activities  difficutly sleeping, diffculty lifting groceries, putting on shoes, picking things up off floor, getting in/out of truck    Multiple Pain Sites  Yes    Pain Score  5    Pain Location  Neck    Pain Descriptors / Indicators  Throbbing    Pain Type  Chronic pain    Pain Radiating Towards  into bilateral shoulders    Pain Onset  More than a month ago    Pain Frequency  Intermittent    Aggravating Factors   sitting long periods    Pain Relieving Factors  take my bra off    Effect of Pain on Daily Activities  see above         [facility] PT Assessment - 04/06/19 0001      Assessment   Medical Diagnosis  low back pain without sciatica, cervicalgia    Hand Dominance  Right  Prior Therapy  no      Precautions   Precautions  None      Restrictions   Weight Bearing Restrictions  No      Balance Screen   Has the patient fallen in the past 6 months  No    Is the patient reluctant to leave their home because of a fear of falling?   No      Home Environment   Living Environment  Private residence    Living Arrangements  Children    Type of Home  Apartment    Home Access  Level entry    Home Layout  One level    Home Equipment  None      Prior Function   Level of Independence  Independent    Leisure  go to movies, go to park      Cognition   Overall Cognitive Status  Within Functional Limits for tasks assessed      Observation/Other Assessments   Focus on Therapeutic Outcomes (FOTO)   deferred due to MCD       ROM / Strength   AROM / PROM / Strength  AROM;Strength      AROM   AROM Assessment Site  Cervical;Lumbar    Cervical Flexion  25    Cervical Extension  12    Cervical - Right Side Bend  14    Cervical - Left Side Bend  22    Cervical - Right Rotation  45    Cervical - Left Rotation  60    Lumbar Flexion  25    increased pain   Lumbar Extension  12    Lumbar - Right Rotation  WFL    Lum[facility] - [facility] Rotation  [facility]      Strength   Strength Assessment Site  Shoulder;Hip    Right/Left Shoulder  Right;Left    Right Shoulder Flexion  4/5    Right Shoulder Extension  4/5    Right Shoulder ABduction  4/5    Right Shoulder Internal Rotation  4/5    Right Shoulder External Rotation  4/5    Left Shoulder Flexion  4/5    Left Shoulder Extension  4/5    Left Shoulder ABduction  4/5    Left Shoulder Internal Rotation  4/5    Left Shoulder External Rotation  4/5    Right/Left Hip  Right;Left    Right Hip Flexion  5/5    Right Hip Extension  5/5    Right Hip ABduction  4/5    Right Hip ADduction  4/5    Left Hip Flexion  5/5    Left Hip Extension  5/5    Left Hip ABduction  4/5    Left Hip ADduction  4/5      Flexibility   Soft Tissue Assessment /Muscle Length  yes    Hamstrings  R: 55 degrees, L: 62 degrees      Palpation   Palpation comment  TTP PSIS,  L3-L5 paraspinals      Special Tests    Special Tests  Sacrolliac Tests    Sacroiliac Tests   Sacral Compression      Sacral Compression   Findings  Negative      Transfers   Five time sit to stand comments   44 seconds using UE support      Ambulation/Gait   Gait Pattern  Step-through pattern;Wide base of support;Poor foot clearance - left;Poor foot clearance - right  Objective measurements completed on examination: See above findings.              PT Education - 04/06/19 1611    Education Details  HEP    Person(s) Educated  Patient    Methods  Explanation;Demonstration;Other (comment);Handout    Comprehension  Verbalized understanding;Returned demonstration       PT Short Term Goals - 04/06/19 1647      PT SHORT TERM GOAL #1   Title  Pt will be independent in her HEP.    Baseline  initial HEP issued on 04/06/2019    Time  4    Period  Weeks    Status  New    Target Date  05/04/19       PT SHORT TERM GOAL #2   Title  Pt will be able to perform postural correction with sitting and standing.    Baseline  began postural correction education    Time  4    Period  Weeks    Status  New    Target Date  05/04/19      PT SHORT TERM GOAL #3   Title  Pt will be able to pick up objects off the floor with pain less than 3/10 in her low back.    Baseline  8/10 at rest    Time  4    Period  Weeks    Status  New    Target Date  05/04/19                Plan - 04/06/19 1650    Clinical Impression Statement  Pt arriving to therpay with long term history of low back pain (8/10) and neck pain (5/10). Pt feels her larger breast are creating most of her neck and upper back pain. Pt with mild weakness noted in bilateral shoulder of 4/5 with normal ROM. Pt with mild weakness in her hip abd/add bilaterally. Pt TTP over SI joint and L3-L5 paraspinals. Pt reporting increased pain with flexion with limitations noted. Pt with limited R cervical rotation and tightness noted in bilateral upper traps. Pt issued a beginning HEP and reported understanding with demonstration. Skilled PT needed to address pt's impairments with the below interventions.    Personal Factors and Comorbidities  Comorbidity 3+    Comorbidities  HTN, DM, obesity, depression    Examination-Activity Limitations  Carry;Dressing;Lift;Squat;Sit;Sleep    Examination-Participation Restrictions  Community Activity;Driving;Laundry;Cleaning    Stability/Clinical Decision Making  Evolving/Moderate complexity    Clinical Decision Making  Moderate    Rehab Potential  Fair    PT Treatment/Interventions  ADLs/Self Care Home Management;Cryotherapy;Electrical Stimulation;Moist Heat;Ultrasound;Functional mobility training;Stair training;Gait training;Therapeutic activities;Therapeutic exercise;Balance training;Neuromuscular re-education;Passive range of motion;Dry needling;Taping;Patient/family education;Manual techniques    PT Next Visit  Plan  Begin Nustep UE and LE, Postural Education, core strengthening, hip strength, cervical ROM, manual and modalities as needed.    PT Home Exercise Plan  Access Code: [email or web address]    Consulted and Agree with Plan of Care  Patient       Patient will benefit from skilled therapeutic intervention in order to improve the following deficits and impairments:  Pain, Obesity, Decreased strength, Decreased range of motion, Postural dysfunction, Decreased mobility  Visit Diagnosis: Chronic midline low back pain without sciatica  Cervicalgia  Muscle weakness (generalized)  Postural kyphosis of thoracic region     Problem List Patient Active Problem List   Diagnosis Date Noted  . PCOS (polycystic ovarian syndrome) 02/24/2019  . Neck pain  02/24/2019  . Morbid obesity with body mass index (BMI) of 45.0 to 49.9 in adult [facility]) 02/24/2019  . Essential hypertension 02/24/2019  . Gastroesophageal reflux disease without esophagitis 02/24/2019  . Irregular menstrual cycle 01/14/2019  . Abnormal uterine bleeding 01/14/2019  . Chronic cough 01/14/2019    Oretha Caprice, PT 04/06/2019, 5:13 PM  [facility] [address] [place], [place], [address] Phone: [phone]   Fax:  [phone]  Name: Ann Moran MRN: [id number] Date of Birth: 1986/02/23

## 2019-04-08 ENCOUNTER — Ambulatory Visit (INDEPENDENT_AMBULATORY_CARE_PROVIDER_SITE_OTHER): Payer: Medicaid Other | Admitting: Obstetrics & Gynecology

## 2019-04-08 ENCOUNTER — Other Ambulatory Visit: Payer: Self-pay

## 2019-04-08 ENCOUNTER — Other Ambulatory Visit (HOSPITAL_COMMUNITY)
Admission: RE | Admit: 2019-04-08 | Discharge: 2019-04-08 | Disposition: A | Discharge status: Home / Self Care | Payer: Medicaid Other | Source: Ambulatory Visit | Attending: Obstetrics & Gynecology | Admitting: Obstetrics & Gynecology

## 2019-04-08 ENCOUNTER — Encounter: Payer: Self-pay | Admitting: Obstetrics & Gynecology

## 2019-04-08 VITALS — BP 148/93 | HR 125 | Wt 333.0 lb

## 2019-04-08 DIAGNOSIS — Z0000 Encounter for general adult medical examination without abnormal findings: Secondary | ICD-10-CM

## 2019-04-08 DIAGNOSIS — Z01419 Encounter for gynecological examination (general) (routine) without abnormal findings: Secondary | ICD-10-CM

## 2019-04-08 NOTE — Progress Notes (Signed)
Subjective:    Ann Moran is a 33 y.o. engaged P61 (85 yo daughter) who presents for an annual exam. The patient has no complaints today. The patient is not currently sexually active, abstinent since 2/20.GYN screening history: last pap: was normal. The patient wears seatbelts: yes. The patient participates in regular exercise: no. Has the patient ever been transfused or tattooed?: yes. The patient reports that there is not domestic violence in her life.   Menstrual History: OB History    Gravida  2   Para  1   Term  0   Preterm  1   AB  1   Living  1     SAB      TAB      Ectopic  1   Multiple      Live Births  1           Menarche age: 20 No LMP recorded. (Menstrual status: Irregular Periods).    The following portions of the patient's history were reviewed and updated as appropriate: allergies, current medications, past family history, past medical history, past social history, past surgical history and problem list.  Review of Systems Pertinent items are noted in HPI.   FH- no breast/gyn cancer, + paternal GM and aunt She is a homemaker Periods are irregular. She was diagnosed with PCOS by Dr. [name] last month and was started on metformin and OCPs.  She stopped the depression medication and feels fine, denies SI/HI.   Objective:    BP (!) 148/93   Pulse (!) 125   Wt (!) 333 lb (151 kg)   BMI 49.18 kg/m   General Appearance:    Alert, cooperative, no distress, appears stated age  Head:    Normocephalic, without obvious abnormality, atraumatic  Eyes:    PERRL, conjunctiva/corneas clear, EOM's intact, fundi    benign, both eyes  Ears:    Normal TM's and external ear canals, both ears  Nose:   Nares normal, septum midline, mucosa normal, no drainage    or sinus tenderness  Throat:   Lips, mucosa, and tongue normal; teeth and gums normal  Neck:   Supple, symmetrical, trachea midline, no adenopathy;    thyroid:  no enlargement/tenderness/nodules; no  carotid   bruit or JVD  Back:     Symmetric, no curvature, ROM normal, no CVA tenderness  Lungs:     Clear to auscultation bilaterally, respirations unlabored  Chest Wall:    No tenderness or deformity   Heart:    Regular rate and rhythm, S1 and S2 normal, no murmur, rub   or gallop  Breast Exam:    No tenderness, masses, or nipple abnormality  Abdomen:     Soft, non-tender, bowel sounds active all four quadrants,    no masses, no organomegaly  Genitalia:    Normal female without lesion, discharge or tenderness, no masses with bimanual exam     Extremities:   Extremities normal, atraumatic, no cyanosis or edema  Pulses:   2+ and symmetric all extremities  Skin:   Skin color, texture, turgor normal, no rashes or lesions  Lymph nodes:   Cervical, supraclavicular, and axillary nodes normal  Neurologic:   CNII-XII intact, normal strength, sensation and reflexes    throughout  .    Assessment:    Healthy female exam.    Plan:     Thin prep Pap smear. with cotesting Rec healthy lifestyle choices

## 2019-04-10 LAB — CYTOLOGY - PAP
Comment: NEGATIVE
Diagnosis: NEGATIVE
High risk HPV: NEGATIVE

## 2019-04-12 NOTE — Progress Notes (Signed)
Subjective:    Patient ID: Ann Moran, female    DOB: 11-Jun-1985, 33 y.o.   MRN: [id number] Virtual Visit via Video Note  I connected with@ on 04/13/19 at 900AM by a video enabled telemedicine application and verified that I am speaking with the correct person using two identifiers.   Consent:  I discussed the limitations, risks, security and privacy concerns of performing an evaluation and management service by video visit and the availability of in person appointments. I also discussed with the patient that there may be a patient responsible charge related to this service. The patient expressed understanding and agreed to proceed.  Location of patient: Patient was at home  Location of provider: I was in my office Persons participating in the televisit with the patient.   No one else on the call    History of Present Illness: 33 y.o.F  Here to establish for PCP and post ED visit for cough.   Note seen 12/2018 via phone visit as per below:  This is a 33 year old female who is here post emergency room visit and is seen by way of a telephone telemedicine visit.  The patient was in the emergency room recently with productive cough that is been off and on worse with nocturnal coughing but improved with Tessalon.  Initially had productive of brown-yellow mucus now is resolved.  There is no fever no ear ache there had been shortness of breath and some wheezing initially.  Mucinex and benzonatate did help.  She does have a long-term high level reflux that she complains of.  She smokes 10 cigarettes a day.  She has had cough in the past.  Another complaint is that of irregular periods with excess bleeding when she does have the.  This patient's been seen by gynecology in the past for dysmenorrhea.  She also has upper shoulder and back pain and weighs 318 pounds and has very enlarged breasts.     Below is documentation from the urgent care visit History of Present Illness:Ann E  Horstis a 33 y.o.femalepresents with cough. This has been constant over the past week. Taking mucinex and tessalonand this is helping with the cough.She is out of the tessalon pearls. She had these from previous illness. No fever. Nosore throat. Niece has been sick alsowith similar symptoms. No Asthma. No COVID exposure. She has also been using inhaler. Current smoker.Hx of bronchitis  02/24/2019 Since the last office visit that was a telephone visit the patient is seen today face-to-face.  She states her cough is improved however she still having daily reflux.  She is still smoking half pack a day of cigarettes.  She complains of pain in the neck and shoulders.  She has been seen by GYN and evaluation of her irregular menstrual cycle and has been diagnosed with polycystic ovarian syndrome and is now on Metformin.  She is also on an oral antibiotic for bacterial vaginosis.  Note today blood pressure is quite elevated in the office and she is not had a diagnosis of hypertension previously.  She does increasing in weight.  She struggles to lose the weight.  12/21: This is a follow-up video visit from the last visit November 3.  The patient maintains amlodipine 10 mg daily and note thyroid function was normal. Patient does have ongoing reflux symptoms despite being on Protonix 40 mg daily and has not been compliant with her reflux diet.  The patient has had increased cough recently when her daughter had a URI  type syndrome.  The patient denies fever but she does have muscle aches headaches minimally productive cough and some mild dyspnea.  She denies fevers there is no other Covid contact the patient is aware of  No chest x-ray obtained in November was normal no active disease  Note her neck continues to have some pain but the exercises we gave her have helped some degree and a C-spine of the neck was normal   Cough This is a chronic problem. The current episode started more than 1 month ago.  The problem has been rapidly improving. The cough is non-productive. Associated symptoms include heartburn, shortness of breath and wheezing. Pertinent negatives include no chest pain, ear congestion, ear pain, headaches, hemoptysis, nasal congestion, postnasal drip, rhinorrhea or sore throat. The symptoms are aggravated by fumes. She has tried prescription cough suppressant for the symptoms. The treatment provided significant relief. Her past medical history is significant for bronchitis. There is no history of asthma, bronchiectasis, COPD or pneumonia.    Past Medical History:  Diagnosis Date  . Chlamydia   . Depression    hx of meds, none currently- doing ok  . Infection due to trichomonas   . MRSA (methicillin resistant staph aureus) culture positive    Dec 2012  . Obesity   . Pregnancy induced hypertension   . Urinary tract infection      Family History  Problem Relation Age of Onset  . Hypertension Mother   . Diabetes Mother   . Asthma Father   . Hypertension Father   . Stroke Maternal Grandmother   . Diabetes Paternal Grandmother   . Hypertension Paternal Grandmother      Social History   Socioeconomic History  . Marital status: Single    Spouse name: Not on file  . Number of children: Not on file  . Years of education: Not on file  . Highest education level: Not on file  Occupational History  . Not on file  Tobacco Use  . Smoking status: Current Every Day Smoker    Packs/day: 0.50    Years: 9.00    Pack years: 4.50    Types: Cigarettes  . Smokeless tobacco: Never Used  Substance and Sexual Activity  . Alcohol use: Yes    Comment: occasional  . Drug use: No  . Sexual activity: Yes    Birth control/protection: None  Other Topics Concern  . Not on file  Social History Narrative  . Not on file   Social Determinants of Health   Financial Resource Strain:   . Difficulty of Paying Living Expenses: Not on file  Food Insecurity:   . Worried About Ship broker in the Last Year: Not on file  . Ran Out of Food in the Last Year: Not on file  Transportation Needs:   . Lack of Transportation (Medical): Not on file  . Lack of Transportation (Non-Medical): Not on file  Physical Activity:   . Days of Exercise per Week: Not on file  . Minutes of Exercise per Session: Not on file  Stress:   . Feeling of Stress : Not on file  Social Connections:   . Frequency of Communication with Friends and Family: Not on file  . Frequency of Social Gatherings with Friends and Family: Not on file  . Attends Religious Services: Not on file  . Active Member of Clubs or Organizations: Not on file  . Attends Archivist Meetings: Not on file  . Marital Status:  Not on file  Intimate Partner Violence:   . Fear of Current or Ex-Partner: Not on file  . Emotionally Abused: Not on file  . Physically Abused: Not on file  . Sexually Abused: Not on file     No Known Allergies   Outpatient Medications Prior to Visit  Medication Sig Dispense Refill  . albuterol (VENTOLIN HFA) 108 (90 Base) MCG/ACT inhaler Inhale 1-2 puffs into the lungs every 6 (six) hours as needed for wheezing or shortness of breath. 18 g 1  . amLODipine (NORVASC) 10 MG tablet Take 1 tablet (10 mg total) by mouth daily. 90 tablet 3  . cyclobenzaprine (FLEXERIL) 10 MG tablet Take 1 tablet (10 mg total) by mouth 3 (three) times daily as needed for muscle spasms. 30 tablet 0  . DULoxetine (CYMBALTA) 30 MG capsule Take 1 capsule (30 mg total) by mouth daily. 30 capsule 3  . ibuprofen (ADVIL) 600 MG tablet Take 1 tablet (600 mg total) by mouth every 8 (eight) hours as needed. 30 tablet 0  . metFORMIN (GLUCOPHAGE) 500 MG tablet Take 1 tablet (500 mg total) by mouth 2 (two) times daily with a meal. 60 tablet 5  . Norethindrone Acetate-Ethinyl Estrad-FE (LOESTRIN 24 FE) 1-20 MG-MCG(24) tablet Take 1 tablet by mouth daily. 3 Package 3  . pantoprazole (PROTONIX) 40 MG tablet Take 1 tablet (40 mg total)  by mouth daily. 30 tablet 3  . spironolactone (ALDACTONE) 25 MG tablet Take 1 tablet (25 mg total) by mouth daily. 30 tablet 2  . neomycin-polymyxin b-dexamethasone (MAXITROL) 3.5-10000-0.1 OINT APPLY 1 INCH INTO LOWER EYELIDS BEFORE BEDTIME    . PAZEO 0.7 % SOLN INSTILL 1 DROP IN BOTH EYES EVERY MORNING    . RESTASIS 0.05 % ophthalmic emulsion INSTILL 1 DROP  BID IN OU    . sulfamethoxazole-trimethoprim (BACTRIM DS) 800-160 MG tablet Take 1 tablet by mouth 2 (two) times daily. (Patient not taking: Reported on 04/13/2019) 6 tablet 0   No facility-administered medications prior to visit.     Review of Systems  HENT: Negative for ear pain, postnasal drip, rhinorrhea and sore throat.   Respiratory: Positive for cough, shortness of breath and wheezing. Negative for hemoptysis.   Cardiovascular: Negative for chest pain.  Gastrointestinal: Positive for heartburn.  Neurological: Negative for headaches.       Objective:   Physical Exam  There were no vitals taken for this visit. This is a video visit so there is no exam however the patient was seen on the video system to be in no acute distress and did not cough any during the interview  BMP Latest Ref Rng & Units 02/16/2019 06/30/2018 04/30/2018  Glucose 65 - 99 mg/dL 129(H) 93 90  BUN 6 - 20 mg/dL '7 6 8  ' Creatinine 0.57 - 1.00 mg/dL 0.78 0.65 0.59  BUN/Creat Ratio 9 - 23 9 - -  Sodium 134 - 144 mmol/L 141 140 139  Potassium 3.5 - 5.2 mmol/L 4.0 3.7 3.9  Chloride 96 - 106 mmol/L 103 105 104  CO2 20 - 29 mmol/L '24 27 27  ' Calcium 8.7 - 10.2 mg/dL 9.4 8.5(L) 8.6(L)   CBC Latest Ref Rng & Units 02/16/2019 07/20/2018 06/30/2018  WBC 3.4 - 10.8 x10E3/uL 7.1 8.2 6.4  Hemoglobin 11.1 - 15.9 g/dL 13.0 11.2(L) 12.3  Hematocrit 34.0 - 46.6 % 38.7 35.5(L) 39.5  Platelets 150 - 450 x10E3/uL 348 308 287   Hepatic Function Latest Ref Rng & Units 02/16/2019 04/30/2018 11/03/2015  Total  Protein 6.0 - 8.5 g/dL 6.9 7.6 6.1(L)  Albumin 3.8 - 4.8 g/dL 4.3 4.3  3.5  AST 0 - 40 IU/L 15 14(L) 15  ALT 0 - 32 IU/L 20 15 12(L)  Alk Phosphatase 39 - 117 IU/L 68 46 45  Total Bilirubin 0.0 - 1.2 mg/dL 0.2 0.9 0.6         Assessment & Plan:  I personally reviewed all images and lab data in the [facility] system as well as any outside material available during this office visit and agree with the  radiology impressions.   Essential hypertension Essential hypertension now on the amlodipine however the patient does not yet have a blood pressure monitor for this patient at home  Plan will be for the patient to obtain a blood pressure cuff via [facility] as she does have Medicaid and will have this mailed to the home  Patient will continue amlodipine 10 mg daily  Gastroesophageal reflux disease without esophagitis Reflux disease shows ongoing flare due to lack of dietary compliance  We will maintain Protonix daily and have asked the patient to maintain the diet we have recommended for  Chronic cough Chronic cough with worsening now more and I am concerned may be related to Covid  We will obtain a Covid screening study  Neck pain Neck pain and muscle pain improved with neck exercises and the use of Cymbalta at a dose of 30 mg daily  We will maintain Cymbalta at current dose level   Diagnoses and all orders for this visit:  Encounter by telehealth for suspected COVID-19 -     Novel Coronavirus, NAA ([facility])  Chronic cough  Gastroesophageal reflux disease without esophagitis  Neck pain  Essential hypertension  Morbid obesity with body mass index (BMI) of 45.0 to 49.9 in adult [facility])  Other orders -     Blood Pressure Monitoring (BLOOD PRESSURE MONITOR 3) DEVI; 1 Units by Does not apply route 2 (two) times daily. Measure blood pressure twice a day and record results    Follow Up Instructions: The patient knows the next visit will be an in office exam in January   I discussed the assessment and treatment plan with the patient. The  patient was provided an opportunity to ask questions and all were answered. The patient agreed with the plan and demonstrated an understanding of the instructions.   The patient was advised to call back or seek an in-person evaluation if the symptoms worsen or if the condition fails to improve as anticipated.  I provided 30 minutes of non-face-to-face time during this encounter  including  median intraservice time , review of notes, labs, imaging, medications  and explaining diagnosis and management to the patient .    Asencion Noble, MD

## 2019-04-13 ENCOUNTER — Other Ambulatory Visit: Payer: Self-pay

## 2019-04-13 ENCOUNTER — Ambulatory Visit: Payer: Medicaid Other | Attending: Critical Care Medicine | Admitting: Critical Care Medicine

## 2019-04-13 ENCOUNTER — Encounter: Payer: Self-pay | Admitting: Critical Care Medicine

## 2019-04-13 DIAGNOSIS — Z79899 Other long term (current) drug therapy: Secondary | ICD-10-CM

## 2019-04-13 DIAGNOSIS — R053 Chronic cough: Secondary | ICD-10-CM

## 2019-04-13 DIAGNOSIS — K219 Gastro-esophageal reflux disease without esophagitis: Secondary | ICD-10-CM | POA: Diagnosis not present

## 2019-04-13 DIAGNOSIS — Z6841 Body Mass Index (BMI) 40.0 and over, adult: Secondary | ICD-10-CM

## 2019-04-13 DIAGNOSIS — R05 Cough: Secondary | ICD-10-CM | POA: Diagnosis not present

## 2019-04-13 DIAGNOSIS — Z20828 Contact with and (suspected) exposure to other viral communicable diseases: Secondary | ICD-10-CM

## 2019-04-13 DIAGNOSIS — Z20822 Contact with and (suspected) exposure to covid-19: Secondary | ICD-10-CM

## 2019-04-13 DIAGNOSIS — M542 Cervicalgia: Secondary | ICD-10-CM | POA: Diagnosis not present

## 2019-04-13 DIAGNOSIS — I10 Essential (primary) hypertension: Secondary | ICD-10-CM

## 2019-04-13 MED ORDER — BLOOD PRESSURE MONITOR 3 DEVI: 1.0000 [IU] | Freq: Two times a day (BID) | 0 refills | Status: DC

## 2019-04-13 NOTE — Assessment & Plan Note (Signed)
Essential hypertension now on the amlodipine however the patient does not yet have a blood pressure monitor for this patient at home  Plan will be for the patient to obtain a blood pressure cuff via [facility] as she does have Medicaid and will have this mailed to the home  Patient will continue amlodipine 10 mg daily

## 2019-04-13 NOTE — Assessment & Plan Note (Signed)
Reflux disease shows ongoing flare due to lack of dietary compliance  We will maintain Protonix daily and have asked the patient to maintain the diet we have recommended for

## 2019-04-13 NOTE — Assessment & Plan Note (Signed)
Chronic cough with worsening now more and I am concerned may be related to Covid  We will obtain a Covid screening study

## 2019-04-13 NOTE — Assessment & Plan Note (Signed)
Neck pain and muscle pain improved with neck exercises and the use of Cymbalta at a dose of 30 mg daily  We will maintain Cymbalta at current dose level

## 2019-04-14 ENCOUNTER — Ambulatory Visit: Payer: Medicaid Other | Admitting: Physical Therapy

## 2019-04-21 ENCOUNTER — Ambulatory Visit: Payer: Medicaid Other | Admitting: Physical Therapy

## 2019-04-28 ENCOUNTER — Encounter: Payer: Self-pay | Admitting: Critical Care Medicine

## 2019-04-28 ENCOUNTER — Telehealth: Payer: Medicaid Other | Admitting: Family Medicine

## 2019-04-28 ENCOUNTER — Ambulatory Visit: Payer: Medicaid Other | Attending: Critical Care Medicine | Admitting: Critical Care Medicine

## 2019-04-28 DIAGNOSIS — Z7984 Long term (current) use of oral hypoglycemic drugs: Secondary | ICD-10-CM | POA: Insufficient documentation

## 2019-04-28 DIAGNOSIS — Z79899 Other long term (current) drug therapy: Secondary | ICD-10-CM | POA: Diagnosis not present

## 2019-04-28 DIAGNOSIS — R053 Chronic cough: Secondary | ICD-10-CM

## 2019-04-28 DIAGNOSIS — Z6841 Body Mass Index (BMI) 40.0 and over, adult: Secondary | ICD-10-CM

## 2019-04-28 DIAGNOSIS — I10 Essential (primary) hypertension: Secondary | ICD-10-CM | POA: Diagnosis not present

## 2019-04-28 DIAGNOSIS — K219 Gastro-esophageal reflux disease without esophagitis: Secondary | ICD-10-CM | POA: Diagnosis not present

## 2019-04-28 DIAGNOSIS — Z7901 Long term (current) use of anticoagulants: Secondary | ICD-10-CM | POA: Insufficient documentation

## 2019-04-28 DIAGNOSIS — F17210 Nicotine dependence, cigarettes, uncomplicated: Secondary | ICD-10-CM | POA: Diagnosis not present

## 2019-04-28 DIAGNOSIS — R05 Cough: Secondary | ICD-10-CM | POA: Diagnosis not present

## 2019-04-28 DIAGNOSIS — R0602 Shortness of breath: Secondary | ICD-10-CM | POA: Diagnosis not present

## 2019-04-28 MED ORDER — PANTOPRAZOLE SODIUM 40 MG PO TBEC: 40.0000 mg | DELAYED_RELEASE_TABLET | Freq: Two times a day (BID) | ORAL | 3 refills | Status: DC

## 2019-04-28 MED ORDER — LOSARTAN POTASSIUM 100 MG PO TABS: 100.0000 mg | ORAL_TABLET | Freq: Every day | ORAL | 3 refills | Status: DC

## 2019-04-28 NOTE — Patient Instructions (Signed)
Referral to gastroenterology will be made for your reflux and increase Protonix to 1 twice daily before meals  Begin losartan 100 mg daily for blood pressure  Continue to follow reflux and blood pressure diet  Continue follow-up with your physical therapy appointments  New primary care physician to establish in early March will be scheduled

## 2019-04-28 NOTE — Assessment & Plan Note (Signed)
Chronic cough now improved and resolved  She is advised to continue to pursue smoking cessation

## 2019-04-28 NOTE — Assessment & Plan Note (Signed)
High level reflux disease with ongoing symptom complex  Plan will be for this patient to increase Protonix to twice daily and will make a referral to gastroenterology for further evaluation as she does have Medicaid

## 2019-04-28 NOTE — Assessment & Plan Note (Signed)
Blood pressure not at goal so will add losartan 100 mg daily to the amlodipine which is at 10 mg daily

## 2019-04-28 NOTE — Progress Notes (Signed)
Subjective:    Patient ID: Ann Moran, female    DOB: August 05, 1985, 34 y.o.   MRN: [id number] Virtual Visit via Video Note  I connected with@ on 04/28/19 at 900AM by a video enabled telemedicine application and verified that I am speaking with the correct person using two identifiers.   Consent:  I discussed the limitations, risks, security and privacy concerns of performing an evaluation and management service by video visit and the availability of in person appointments. I also discussed with the patient that there may be a patient responsible charge related to this service. The patient expressed understanding and agreed to proceed.  Location of patient: Patient was at home  Location of provider: I was in my office Persons participating in the televisit with the patient.   No one else on the call    History of Present Illness: 34 y.o.F  Here to establish for PCP and post ED visit for cough.   Note seen 12/2018 via phone visit as per below:  This is a 34 year old female who is here post emergency room visit and is seen by way of a telephone telemedicine visit.  The patient was in the emergency room recently with productive cough that is been off and on worse with nocturnal coughing but improved with Tessalon.  Initially had productive of brown-yellow mucus now is resolved.  There is no fever no ear ache there had been shortness of breath and some wheezing initially.  Mucinex and benzonatate did help.  She does have a long-term high level reflux that she complains of.  She smokes 10 cigarettes a day.  She has had cough in the past.  Another complaint is that of irregular periods with excess bleeding when she does have the.  This patient's been seen by gynecology in the past for dysmenorrhea.  She also has upper shoulder and back pain and weighs 318 pounds and has very enlarged breasts.     Below is documentation from the urgent care visit History of Present Illness:Ann E  Moran a 34 y.o.femalepresents with cough. This has been constant over the past week. Taking mucinex and tessalonand this is helping with the cough.She is out of the tessalon pearls. She had these from previous illness. No fever. Nosore throat. Niece has been sick alsowith similar symptoms. No Asthma. No COVID exposure. She has also been using inhaler. Current smoker.Hx of bronchitis  02/24/2019 Since the last office visit that was a telephone visit the patient is seen today face-to-face.  She states her cough is improved however she still having daily reflux.  She is still smoking half pack a day of cigarettes.  She complains of pain in the neck and shoulders.  She has been seen by GYN and evaluation of her irregular menstrual cycle and has been diagnosed with polycystic ovarian syndrome and is now on Metformin.  She is also on an oral antibiotic for bacterial vaginosis.  Note today blood pressure is quite elevated in the office and she is not had a diagnosis of hypertension previously.  She does increasing in weight.  She struggles to lose the weight.  12/21: This is a follow-up video visit from the last visit November 3.  The patient maintains amlodipine 10 mg daily and note thyroid function was normal. Patient does have ongoing reflux symptoms despite being on Protonix 40 mg daily and has not been compliant with her reflux diet.  The patient has had increased cough recently when her daughter had a URI  type syndrome.  The patient denies fever but she does have muscle aches headaches minimally productive cough and some mild dyspnea.  She denies fevers there is no other Covid contact the patient is aware of  No chest x-ray obtained in November was normal no active disease  Note her neck continues to have some pain but the exercises we gave her have helped some degree and a C-spine of the neck was normal  04/28/2019 This is a follow-up tele health video visit At the last visit we assessed  the patient's reflux disease and had her on daily proton pump inhibitor however despite this she continues to have nightly reflux to the high level causing severe burning in the back of the throat.  She has not lost significant weight.  She is trying to follow a reflux diet.  Another issue is that of hypertension and her blood pressure has been stable on the dose of amlodipine.  Numbers at home include 139/88 there is low 169/112 seen.  The patient states her neck pain is improved on the Cymbalta and she is receiving physical therapy  She does state the cough she had has improved and note she did had a negative Covid test in [place] at a [facility] 2 days ago.   Cough This is a chronic problem. The current episode started more than 1 month ago. The problem has been rapidly improving. The cough is non-productive. Associated symptoms include heartburn, shortness of breath and wheezing. Pertinent negatives include no chest pain, ear congestion, ear pain, headaches, hemoptysis, nasal congestion, postnasal drip, rhinorrhea or sore throat. The symptoms are aggravated by fumes. She has tried prescription cough suppressant for the symptoms. The treatment provided significant relief. Her past medical history is significant for bronchitis. There is no history of asthma, bronchiectasis, COPD or pneumonia.    Past Medical History:  Diagnosis Date  . Chlamydia   . Depression    hx of meds, none currently- doing ok  . Infection due to trichomonas   . MRSA (methicillin resistant staph aureus) culture positive    Dec 2012  . Obesity   . Pregnancy induced hypertension   . Urinary tract infection      Family History  Problem Relation Age of Onset  . Hypertension Mother   . Diabetes Mother   . Asthma Father   . Hypertension Father   . Stroke Maternal Grandmother   . Diabetes Paternal Grandmother   . Hypertension Paternal Grandmother      Social History   Socioeconomic History  . Marital status:  Single    Spouse name: Not on file  . Number of children: Not on file  . Years of education: Not on file  . Highest education level: Not on file  Occupational History  . Not on file  Tobacco Use  . Smoking status: Current Every Day Smoker    Packs/day: 0.50    Years: 9.00    Pack years: 4.50    Types: Cigarettes  . Smokeless tobacco: Never Used  Substance and Sexual Activity  . Alcohol use: Yes    Comment: occasional  . Drug use: No  . Sexual activity: Yes    Birth control/protection: None  Other Topics Concern  . Not on file  Social History Narrative  . Not on file   Social Determinants of Health   Financial Resource Strain:   . Difficulty of Paying Living Expenses: Not on file  Food Insecurity:   . Worried About Estate manager/land agent  of Food in the Last Year: Not on file  . Ran Out of Food in the Last Year: Not on file  Transportation Needs:   . Lack of Transportation (Medical): Not on file  . Lack of Transportation (Non-Medical): Not on file  Physical Activity:   . Days of Exercise per Week: Not on file  . Minutes of Exercise per Session: Not on file  Stress:   . Feeling of Stress : Not on file  Social Connections:   . Frequency of Communication with Friends and Family: Not on file  . Frequency of Social Gatherings with Friends and Family: Not on file  . Attends Religious Services: Not on file  . Active Member of Clubs or Organizations: Not on file  . Attends Archivist Meetings: Not on file  . Marital Status: Not on file  Intimate Partner Violence:   . Fear of Current or Ex-Partner: Not on file  . Emotionally Abused: Not on file  . Physically Abused: Not on file  . Sexually Abused: Not on file     No Known Allergies   Outpatient Medications Prior to Visit  Medication Sig Dispense Refill  . albuterol (VENTOLIN HFA) 108 (90 Base) MCG/ACT inhaler Inhale 1-2 puffs into the lungs every 6 (six) hours as needed for wheezing or shortness of breath. 18 g 1  .  amLODipine (NORVASC) 10 MG tablet Take 1 tablet (10 mg total) by mouth daily. 90 tablet 3  . Blood Pressure Monitoring (BLOOD PRESSURE MONITOR 3) DEVI 1 Units by Does not apply route 2 (two) times daily. Measure blood pressure twice a day and record results 1 each 0  . cyclobenzaprine (FLEXERIL) 10 MG tablet Take 1 tablet (10 mg total) by mouth 3 (three) times daily as needed for muscle spasms. 30 tablet 0  . DULoxetine (CYMBALTA) 30 MG capsule Take 1 capsule (30 mg total) by mouth daily. 30 capsule 3  . ibuprofen (ADVIL) 600 MG tablet Take 1 tablet (600 mg total) by mouth every 8 (eight) hours as needed. 30 tablet 0  . metFORMIN (GLUCOPHAGE) 500 MG tablet Take 1 tablet (500 mg total) by mouth 2 (two) times daily with a meal. 60 tablet 5  . Norethindrone Acetate-Ethinyl Estrad-FE (LOESTRIN 24 FE) 1-20 MG-MCG(24) tablet Take 1 tablet by mouth daily. 3 Package 3  . spironolactone (ALDACTONE) 25 MG tablet Take 1 tablet (25 mg total) by mouth daily. 30 tablet 2  . pantoprazole (PROTONIX) 40 MG tablet Take 1 tablet (40 mg total) by mouth daily. 30 tablet 3  . neomycin-polymyxin b-dexamethasone (MAXITROL) 3.5-10000-0.1 OINT APPLY 1 INCH INTO LOWER EYELIDS BEFORE BEDTIME    . PAZEO 0.7 % SOLN INSTILL 1 DROP IN BOTH EYES EVERY MORNING    . RESTASIS 0.05 % ophthalmic emulsion INSTILL 1 DROP  BID IN OU    . sulfamethoxazole-trimethoprim (BACTRIM DS) 800-160 MG tablet Take 1 tablet by mouth 2 (two) times daily. (Patient not taking: Reported on 04/28/2019) 6 tablet 0   No facility-administered medications prior to visit.     Review of Systems  HENT: Negative for ear pain, postnasal drip, rhinorrhea and sore throat.   Respiratory: Positive for cough, shortness of breath and wheezing. Negative for hemoptysis.   Cardiovascular: Negative for chest pain.  Gastrointestinal: Positive for heartburn.  Neurological: Negative for headaches.       Objective:   Physical Exam  There were no vitals taken for this  visit. This is a video visit so there is no  exam however the patient was seen on the video system to be in no acute distress and did not cough any during the interview  BMP Latest Ref Rng & Units 02/16/2019 06/30/2018 04/30/2018  Glucose 65 - 99 mg/dL 129(H) 93 90  BUN 6 - 20 mg/dL '7 6 8  ' Creatinine 0.57 - 1.00 mg/dL 0.78 0.65 0.59  BUN/Creat Ratio 9 - 23 9 - -  Sodium 134 - 144 mmol/L 141 140 139  Potassium 3.5 - 5.2 mmol/L 4.0 3.7 3.9  Chloride 96 - 106 mmol/L 103 105 104  CO2 20 - 29 mmol/L '24 27 27  ' Calcium 8.7 - 10.2 mg/dL 9.4 8.5(L) 8.6(L)   CBC Latest Ref Rng & Units 02/16/2019 07/20/2018 06/30/2018  WBC 3.4 - 10.8 x10E3/uL 7.1 8.2 6.4  Hemoglobin 11.1 - 15.9 g/dL 13.0 11.2(L) 12.3  Hematocrit 34.0 - 46.6 % 38.7 35.5(L) 39.5  Platelets 150 - 450 x10E3/uL 348 308 287   Hepatic Function Latest Ref Rng & Units 02/16/2019 04/30/2018 11/03/2015  Total Protein 6.0 - 8.5 g/dL 6.9 7.6 6.1(L)  Albumin 3.8 - 4.8 g/dL 4.3 4.3 3.5  AST 0 - 40 IU/L 15 14(L) 15  ALT 0 - 32 IU/L 20 15 12(L)  Alk Phosphatase 39 - 117 IU/L 68 46 45  Total Bilirubin 0.0 - 1.2 mg/dL 0.2 0.9 0.6         Assessment & Plan:  I personally reviewed all images and lab data in the [facility] system as well as any outside material available during this office visit and agree with the  radiology impressions.   Gastroesophageal reflux disease without esophagitis High level reflux disease with ongoing symptom complex  Plan will be for this patient to increase Protonix to twice daily and will make a referral to gastroenterology for further evaluation as she does have Medicaid  Chronic cough Chronic cough now improved and resolved  She is advised to continue to pursue smoking cessation  Essential hypertension Blood pressure not at goal so will add losartan 100 mg daily to the amlodipine which is at 10 mg daily   Diagnoses and all orders for this visit:  Gastroesophageal reflux disease without esophagitis -     Ambulatory  referral to Gastroenterology  Chronic cough  Essential hypertension  Morbid obesity with body mass index (BMI) of 45.0 to 49.9 in adult [facility])  Other orders -     pantoprazole (PROTONIX) 40 MG tablet; Take 1 tablet (40 mg total) by mouth 2 (two) times daily before a meal. -     losartan (COZAAR) 100 MG tablet; Take 1 tablet (100 mg total) by mouth daily.    Follow Up Instructions: A follow-up visit with her new primary care provider will be established in early March   I discussed the assessment and treatment plan with the patient. The patient was provided an opportunity to ask questions and all were answered. The patient agreed with the plan and demonstrated an understanding of the instructions.   The patient was advised to call back or seek an in-person evaluation if the symptoms worsen or if the condition fails to improve as anticipated.  I provided 30 minutes of non-face-to-face time during this encounter  including  median intraservice time , review of notes, labs, imaging, medications  and explaining diagnosis and management to the patient .    Asencion Noble, MD

## 2019-04-29 ENCOUNTER — Ambulatory Visit: Payer: Medicaid Other | Admitting: Physical Therapy

## 2019-05-01 ENCOUNTER — Other Ambulatory Visit: Payer: Self-pay

## 2019-05-01 ENCOUNTER — Ambulatory Visit: Payer: Medicaid Other | Admitting: Physical Therapy

## 2019-05-07 ENCOUNTER — Ambulatory Visit: Payer: Medicaid Other | Admitting: Physical Therapy

## 2019-05-07 ENCOUNTER — Ambulatory Visit: Payer: Medicaid Other | Admitting: Gastroenterology

## 2019-05-13 ENCOUNTER — Other Ambulatory Visit: Payer: Self-pay | Admitting: Critical Care Medicine

## 2019-05-13 MED ORDER — AZITHROMYCIN 250 MG PO TABS: 250.0000 mg | ORAL_TABLET | Freq: Every day | ORAL | 0 refills | Status: DC

## 2019-05-13 NOTE — Progress Notes (Signed)
Rx for Zpak for acute bronchitis

## 2019-05-20 ENCOUNTER — Encounter: Payer: Self-pay | Admitting: Gastroenterology

## 2019-05-20 ENCOUNTER — Ambulatory Visit (INDEPENDENT_AMBULATORY_CARE_PROVIDER_SITE_OTHER): Payer: Medicaid Other | Admitting: Gastroenterology

## 2019-05-20 VITALS — BP 130/80 | HR 107 | Temp 97.9°F | Ht 69.0 in | Wt 326.0 lb

## 2019-05-20 DIAGNOSIS — K219 Gastro-esophageal reflux disease without esophagitis: Secondary | ICD-10-CM

## 2019-05-20 DIAGNOSIS — R1310 Dysphagia, unspecified: Secondary | ICD-10-CM | POA: Insufficient documentation

## 2019-05-20 DIAGNOSIS — Z01818 Encounter for other preprocedural examination: Secondary | ICD-10-CM | POA: Diagnosis not present

## 2019-05-20 MED ORDER — FAMOTIDINE 40 MG PO TABS: 40.0000 mg | ORAL_TABLET | Freq: Every day | ORAL | 5 refills | Status: DC

## 2019-05-20 NOTE — Patient Instructions (Addendum)
You have been scheduled for a endoscopy. Please follow written instructions given to you at your visit today.  Please pick up your prep supplies at the pharmacy within the next 1-3 days. If you use inhalers (even only as needed), please bring them with you on the day of your procedure.  We have sent the following medications to your pharmacy for you to pick up at your convenience:  Pepcid 40 mg at bedtime

## 2019-05-20 NOTE — Progress Notes (Signed)
05/20/2019 Ann Moran [id number] 1986-03-06   HISTORY OF PRESENT ILLNESS: This is a 34 year old female who is new to our office.  She is here today at the request of Dr. Joya Gaskins for evaluation regarding reflux issues.  She tells me that she has been experiencing severe reflux symptoms for the past 2 years or so.  She says that she feels reflux all day, but frequently wakes up at night choking from her acid reflux.  She also reports food getting stuck and notices that pills hangup as well.  No problems swallowing liquids.  When something gets stuck she will drink some water and it will usually go down, but sometimes comes back up.  This is all been going on for about 2 years as well.  She tells me that she was on pantoprazole 20 mg daily for about the past 5 months, but this was just increased to 40 mg twice daily about 2 or 3 weeks ago.  She does smoke.  She says that she used to drink almost 2 L of Pepsi every day, but has significantly decreased her intake, although does drink some but not on a daily basis.   Past Medical History:  Diagnosis Date  . Chlamydia   . Depression    hx of meds, none currently- doing ok  . Infection due to trichomonas   . MRSA (methicillin resistant staph aureus) culture positive    Dec 2012  . Obesity   . Pregnancy induced hypertension   . Urinary tract infection    Past Surgical History:  Procedure Laterality Date  . CHOLECYSTECTOMY    . ECTOPIC PREGNANCY SURGERY      reports that she has been smoking cigarettes. She has a 4.50 pack-year smoking history. She has never used smokeless tobacco. She reports current alcohol use. She reports that she does not use drugs. family history includes Asthma in her father; Diabetes in her mother and paternal grandmother; Hypertension in her father, mother, and paternal grandmother; Stroke in her maternal grandmother. No Known Allergies    Outpatient Encounter Medications as of 05/20/2019  Medication Sig  .  albuterol (VENTOLIN HFA) 108 (90 Base) MCG/ACT inhaler Inhale 1-2 puffs into the lungs every 6 (six) hours as needed for wheezing or shortness of breath.  Marland Kitchen amLODipine (NORVASC) 10 MG tablet Take 1 tablet (10 mg total) by mouth daily.  Marland Kitchen azithromycin (ZITHROMAX) 250 MG tablet Take 1 tablet (250 mg total) by mouth daily. Take two once then one daily until gone  . Blood Pressure Monitoring (BLOOD PRESSURE MONITOR 3) DEVI 1 Units by Does not apply route 2 (two) times daily. Measure blood pressure twice a day and record results  . cyclobenzaprine (FLEXERIL) 10 MG tablet Take 1 tablet (10 mg total) by mouth 3 (three) times daily as needed for muscle spasms.  Marland Kitchen ibuprofen (ADVIL) 600 MG tablet Take 1 tablet (600 mg total) by mouth every 8 (eight) hours as needed.  Marland Kitchen losartan (COZAAR) 100 MG tablet Take 1 tablet (100 mg total) by mouth daily.  . metFORMIN (GLUCOPHAGE) 500 MG tablet Take 1 tablet (500 mg total) by mouth 2 (two) times daily with a meal.  . Norethindrone Acetate-Ethinyl Estrad-FE (LOESTRIN 24 FE) 1-20 MG-MCG(24) tablet Take 1 tablet by mouth daily.  . pantoprazole (PROTONIX) 40 MG tablet Take 1 tablet (40 mg total) by mouth 2 (two) times daily before a meal.  . spironolactone (ALDACTONE) 25 MG tablet Take 1 tablet (25 mg total) by mouth  daily.  . famotidine (PEPCID) 40 MG tablet Take 1 tablet (40 mg total) by mouth at bedtime.  . [DISCONTINUED] DULoxetine (CYMBALTA) 30 MG capsule Take 1 capsule (30 mg total) by mouth daily.  . [DISCONTINUED] fluticasone (FLONASE) 50 MCG/ACT nasal spray Place 2 sprays into both nostrils daily. (Patient not taking: Reported on 04/30/2018)  . [DISCONTINUED] medroxyPROGESTERone (PROVERA) 5 MG tablet Take 2 tablets (10 mg total) by mouth daily. (Patient not taking: Reported on 07/20/2018)  . [DISCONTINUED] neomycin-polymyxin b-dexamethasone (MAXITROL) 3.5-10000-0.1 OINT APPLY 1 INCH INTO LOWER EYELIDS BEFORE BEDTIME   No facility-administered encounter medications on  file as of 05/20/2019.     REVIEW OF SYSTEMS  : All other systems reviewed and negative except where noted in the History of Present Illness.   PHYSICAL EXAM: BP 130/80   Pulse (!) 107   Temp 97.9 F (36.6 C)   Ht 5\' 9"  (1.753 m)   Wt (!) 326 lb (147.9 kg)   BMI 48.14 kg/m  General: Well developed AA female in no acute distress Head: Normocephalic and atraumatic Eyes:  Sclerae anicteric, conjunctiva pink. Ears: Normal auditory acuity Lungs: Clear throughout to auscultation; no increased WOB. Heart: Regular rate and rhythm; no M/R/G. Abdomen: Soft, non-distended.  BS present.  Non-tender. Musculoskeletal: Symmetrical with no gross deformities  Skin: No lesions on visible extremities Extremities: No edema  Neurological: Alert oriented x 4, grossly non-focal Psychological:  Alert and cooperative. Normal mood and affect  ASSESSMENT AND PLAN: *GERD and dysphagia: Says the symptoms have been present for the about 2 years.  Wakes up choking at night from reflux.  Reports frequent dysphagia to food and pills.  No issues with liquids.  Previously on pantoprazole 20 mg daily, but increased to 40 mg twice daily 2 weeks ago.  I would like her to continue her current regimen and I am also going to add Pepcid 40 mg at bedtime.  Prescription sent.  We talked about acid reflux dietary and lifestyle modifications.  We will plan for endoscopy with Dr. to further evaluate the dysphagia.  Question if this is related to reflux esophagitis versus stricture versus EOE, etc.  **The risks, benefits, and alternatives to EGD with dilation were discussed with the patient and she consents to proceed.   CC:  Leone Payor, MD

## 2019-05-21 ENCOUNTER — Inpatient Hospital Stay: Admission: RE | Admit: 2019-05-21 | Payer: Medicaid Other | Source: Ambulatory Visit

## 2019-05-28 ENCOUNTER — Other Ambulatory Visit: Payer: Self-pay

## 2019-05-28 ENCOUNTER — Ambulatory Visit: Payer: Medicaid Other | Attending: Family Medicine | Admitting: Physician Assistant

## 2019-05-28 DIAGNOSIS — T162XXA Foreign body in left ear, initial encounter: Secondary | ICD-10-CM | POA: Diagnosis not present

## 2019-05-28 DIAGNOSIS — H9202 Otalgia, left ear: Secondary | ICD-10-CM | POA: Diagnosis not present

## 2019-05-28 MED ORDER — FLUCONAZOLE 150 MG PO TABS: 150.0000 mg | ORAL_TABLET | Freq: Once | ORAL | 0 refills | Status: AC

## 2019-05-28 MED ORDER — AMOXICILLIN 500 MG PO CAPS: 500.0000 mg | ORAL_CAPSULE | Freq: Three times a day (TID) | ORAL | 0 refills | Status: DC

## 2019-05-28 NOTE — Progress Notes (Signed)
Patient ID: Ann Moran, female   DOB: 21-Dec-1985, 34 y.o.   MRN: [id number] Virtual Visit via Telephone Note  I connected with [name] on 05/28/19 at  3:10 PM EST by telephone and verified that I am speaking with the correct person using two identifiers.   I discussed the limitations, risks, security and privacy concerns of performing an evaluation and management service by telephone and the availability of in person appointments. I also discussed with the patient that there may be a patient responsible charge related to this service. The patient expressed understanding and agreed to proceed.  PATIENT visit by telephone virtually in the context of Covid-19 pandemic. Patient location:  home My Location:  [facility] office Persons on the call:  Me and the patient  History of Present Illness:  L ear pain after a qtip broke off in her L ear.  She removed it with tweezers then poured peroxide into her ear.  Very painful.  This occurred yesterday.  No fever.  Pain has resolved.  Hearing is normal now.  She does have some residual popping in the ear    Observations/Objective:  NAD>  A&Ox3   Assessment and Plan: 1. Left ear pain Will cover for infection - amoxicillin (AMOXIL) 500 MG capsule; Take 1 capsule (500 mg total) by mouth 3 (three) times daily.  Dispense: 30 capsule; Refill: 0 - fluconazole (DIFLUCAN) 150 MG tablet; Take 1 tablet (150 mg total) by mouth once for 1 dose.  Dispense: 1 tablet; Refill: 0  2. Foreign body in auricle of left ear, initial encounter Concern for residual FB or TM rupture, but If completely back to normal at time of ENT appt, she will cancel it if not needed - Ambulatory referral to ENT - amoxicillin (AMOXIL) 500 MG capsule; Take 1 capsule (500 mg total) by mouth 3 (three) times daily.  Dispense: 30 capsule; Refill: 0 - fluconazole (DIFLUCAN) 150 MG tablet; Take 1 tablet (150 mg total) by mouth once for 1 dose.  Dispense: 1 tablet; Refill: 0   Follow Up  Instructions: prn   I discussed the assessment and treatment plan with the patient. The patient was provided an opportunity to ask questions and all were answered. The patient agreed with the plan and demonstrated an understanding of the instructions.   The patient was advised to call back or seek an in-person evaluation if the symptoms worsen or if the condition fails to improve as anticipated.  I provided 9 minutes of non-face-to-face time during this encounter.   Georgian Co, PA-C

## 2019-06-10 ENCOUNTER — Other Ambulatory Visit: Payer: Self-pay | Admitting: Internal Medicine

## 2019-06-11 LAB — SARS CORONAVIRUS 2 (TAT 6-24 HRS): SARS Coronavirus 2: NEGATIVE

## 2019-06-12 ENCOUNTER — Encounter: Payer: Self-pay | Admitting: Internal Medicine

## 2019-06-12 ENCOUNTER — Ambulatory Visit (AMBULATORY_SURGERY_CENTER): Payer: Medicaid Other | Admitting: Internal Medicine

## 2019-06-12 ENCOUNTER — Other Ambulatory Visit: Payer: Self-pay

## 2019-06-12 VITALS — BP 138/95 | HR 95 | Temp 97.3°F | Resp 14 | Ht 69.0 in | Wt 326.0 lb

## 2019-06-12 DIAGNOSIS — R1310 Dysphagia, unspecified: Secondary | ICD-10-CM | POA: Diagnosis not present

## 2019-06-12 DIAGNOSIS — K219 Gastro-esophageal reflux disease without esophagitis: Secondary | ICD-10-CM | POA: Diagnosis not present

## 2019-06-12 MED ORDER — SODIUM CHLORIDE 0.9 % IV SOLN: 500.0000 mL | Freq: Once | INTRAVENOUS | Status: DC

## 2019-06-12 NOTE — Op Note (Signed)
[facility] Patient Name: Ann Moran Procedure Date: 06/12/2019 3:48 PM MRN: [id number] Endoscopist: Iva Boop , MD Age: 34 Referring MD:  Date of Birth: 10-26-1985 Gender: Female Account #: [id number] Procedure:                Upper GI endoscopy Indications:              Dysphagia, Heartburn, Esophageal reflux symptoms                            that persist despite appropriate therapy Medicines:                Propofol per Anesthesia, Monitored Anesthesia Care Procedure:                Pre-Anesthesia Assessment:                           - Prior to the procedure, a History and Physical                            was performed, and patient medications and                            allergies were reviewed. The patient's tolerance of                            previous anesthesia was also reviewed. The risks                            and benefits of the procedure and the sedation                            options and risks were discussed with the patient.                            All questions were answered, and informed consent                            was obtained. Prior Anticoagulants: The patient has                            taken no previous anticoagulant or antiplatelet                            agents. ASA Grade Assessment: III - A patient with                            severe systemic disease. After reviewing the risks                            and benefits, the patient was deemed in                            satisfactory condition to undergo the procedure.  After obtaining informed consent, the endoscope was                            passed under direct vision. Throughout the                            procedure, the patient's blood pressure, pulse, and                            oxygen saturations were monitored continuously. The                            Endoscope was introduced through the mouth, and             advanced to the second part of duodenum. The upper                            GI endoscopy was accomplished without difficulty.                            The patient tolerated the procedure well. Scope In: Scope Out: Findings:                 The esophagus was normal.                           The stomach was normal.                           The examined duodenum was normal.                           The cardia and gastric fundus were normal on                            retroflexion.                           The scope was withdrawn. Dilation was performed in                            the entire esophagus with a Maloney dilator with                            mild resistance at 54 Fr. Estimated blood loss:                            none. Scope re-inserted and no trauma seen Complications:            No immediate complications. Estimated Blood Loss:     Estimated blood loss: none. Impression:               - Normal esophagus.                           - Normal stomach.                           -  Normal examined duodenum.                           - Dilation performed in the entire esophagus.                           - No specimens collected. Recommendation:           - Patient has a contact number available for                            emergencies. The signs and symptoms of potential                            delayed complications were discussed with the                            patient. Return to normal activities tomorrow.                            Written discharge instructions were provided to the                            patient.                           - Resume previous diet.                           - Continue present medications.                           - Weight loss - see dietitian if has not seen re:                            DM, obesity, PCP may arrange                           MY OFFICE WILL SET UP GASTRIC EMPTYING STUDY RE:                             REGURGITATION                           Contine bid PPI for now but she says no better so                            do not anticipate for long and keep in mind if                            regurgitation main sx PPI can only help so much Ann Mayer, MD 06/12/2019 4:18:23 PM This report has been signed electronically.

## 2019-06-12 NOTE — Progress Notes (Signed)
Called to room to assist during endoscopic procedure.  Patient ID and intended procedure confirmed with present staff. Received instructions for my participation in the procedure from the performing physician.  

## 2019-06-12 NOTE — Progress Notes (Signed)
Report given to PACU, vss 

## 2019-06-12 NOTE — Progress Notes (Signed)
Temp by LC, Vitals by DT 

## 2019-06-12 NOTE — Patient Instructions (Addendum)
POST  DILATION DIET REVIEWED WITH PT RESUME PREVIOUS DIET CONTINUE CURRENT MEDICATIONS WEIGHT LOSS;  See dietician if you have not seen regarding diabetes and obesity The exam was ok - normal esophagus, stomach and duodenum (upper intestine).  I did dilate the esophagus to see if that would help you swallow better.  I am going to have my office set up a study to check the emptying of your stomach - called a gastric emptying study. It is possible that will tell us why you are having these problems.  I know it is not easy but losing weight is likely to help as well. If you have not seen a dietitian that would be a good idea and can be arranged through primary care provider.  I appreciate the opportunity to care for you. Iva Boop, MD, FACG   YOU HAD AN ENDOSCOPIC PROCEDURE TODAY AT THE [facility]:   Refer to the procedure report that was given to you for any specific questions about what was found during the examination.  If the procedure report does not answer your questions, please call your gastroenterologist to clarify.  If you requested that your care partner not be given the details of your procedure findings, then the procedure report has been included in a sealed envelope for you to review at your convenience later.  YOU SHOULD EXPECT: Some feelings of bloating in the abdomen. Passage of more gas than usual.  Walking can help get rid of the air that was put into your GI tract during the procedure and reduce the bloating. If you had a lower endoscopy (such as a colonoscopy or flexible sigmoidoscopy) you may notice spotting of blood in your stool or on the toilet paper. If you underwent a bowel prep for your procedure, you may not have a normal bowel movement for a few days.  Please Note:  You might notice some irritation and congestion in your nose or some drainage.  This is from the oxygen used during your procedure.  There is no need for concern and it should  clear up in a day or so.  SYMPTOMS TO REPORT IMMEDIATELY:    Following upper endoscopy (EGD)  Vomiting of blood or coffee ground material  New chest pain or pain under the shoulder blades  Painful or persistently difficult swallowing  New shortness of breath  Fever of 100F or higher  Black, tarry-looking stools  For urgent or emergent issues, a gastroenterologist can be reached at any hour by calling [phone].   DIET:  We do recommend a small meal at first, but then you may proceed to your regular diet.  Drink plenty of fluids but you should avoid alcoholic beverages for 24 hours.  ACTIVITY:  You should plan to take it easy for the rest of today and you should NOT DRIVE or use heavy machinery until tomorrow (because of the sedation medicines used during the test).    FOLLOW UP: Our staff will call the number listed on your records 48-72 hours following your procedure to check on you and address any questions or concerns that you may have regarding the information given to you following your procedure. If we do not reach you, we will leave a message.  We will attempt to reach you two times.  During this call, we will ask if you have developed any symptoms of COVID 19. If you develop any symptoms (ie: fever, flu-like symptoms, shortness of breath, cough etc.) before then,  please call [phone].  If you test positive for Covid 19 in the 2 weeks post procedure, please call and report this information to [place].    If any biopsies were taken you will be contacted by phone or by letter within the next 1-3 weeks.  Please call us at [phone] if you have not heard about the biopsies in 3 weeks.    SIGNATURES/CONFIDENTIALITY: You and/or your care partner have signed paperwork which will be entered into your electronic medical record.  These signatures attest to the fact that that the information above on your After Visit Summary has been reviewed and is understood.  Full responsibility  of the confidentiality of this discharge information lies with you and/or your care-partner.

## 2019-06-15 ENCOUNTER — Telehealth: Payer: Self-pay | Admitting: *Deleted

## 2019-06-15 NOTE — Telephone Encounter (Signed)
  Follow up Call-  Call back number 06/12/2019  Post procedure Call Back phone  # [phone]  Permission to leave phone message Yes  Some recent data might be hidden     Patient questions:  Do you have a fever, pain , or abdominal swelling? No. Pain Score  0 *  Have you tolerated food without any problems? Yes.    Have you been able to return to your normal activities? Yes.    Do you have any questions about your discharge instructions: Diet   No. Medications  No. Follow up visit  No.  Do you have questions or concerns about your Care? No.  Actions: * If pain score is 4 or above: No action needed, pain <4.   1. Have you developed a fever since your procedure? no  2.   Have you had an respiratory symptoms (SOB or cough) since your procedure? no  3.   Have you tested positive for COVID 19 since your procedure no  4.   Have you had any family members/close contacts diagnosed with the COVID 19 since your procedure?  no   If yes to any of these questions please route to Laverna Peace, RN and Jennye Boroughs, Charity fundraiser.

## 2019-06-17 ENCOUNTER — Other Ambulatory Visit: Payer: Self-pay

## 2019-06-17 ENCOUNTER — Encounter: Payer: Self-pay | Admitting: Physical Therapy

## 2019-06-17 ENCOUNTER — Telehealth: Payer: Self-pay

## 2019-06-17 ENCOUNTER — Ambulatory Visit: Payer: Medicaid Other | Attending: Critical Care Medicine | Admitting: Physical Therapy

## 2019-06-17 DIAGNOSIS — M4004 Postural kyphosis, thoracic region: Secondary | ICD-10-CM | POA: Diagnosis present

## 2019-06-17 DIAGNOSIS — M6281 Muscle weakness (generalized): Secondary | ICD-10-CM | POA: Diagnosis present

## 2019-06-17 DIAGNOSIS — M545 Low back pain, unspecified: Secondary | ICD-10-CM

## 2019-06-17 DIAGNOSIS — R1110 Vomiting, unspecified: Secondary | ICD-10-CM

## 2019-06-17 DIAGNOSIS — G8929 Other chronic pain: Secondary | ICD-10-CM | POA: Insufficient documentation

## 2019-06-17 DIAGNOSIS — M542 Cervicalgia: Secondary | ICD-10-CM | POA: Diagnosis present

## 2019-06-17 NOTE — Telephone Encounter (Signed)
Called the patient. No answer. Left this information on her voicemail.

## 2019-06-17 NOTE — Telephone Encounter (Signed)
Do not think related to procedure  In absence of other symptoms would do what she is doing  She should ask PCP for any other advice about this

## 2019-06-17 NOTE — Telephone Encounter (Signed)
Call to the patient. Gave her the appointment information about her gastric emptying study.  GES at [facility] 07/02/19 at 7:30 am. Arrive fasting. No PPI or H2 blockers for 8 hours prior to the study. Expect the testing to be 3 to 4 hours long.

## 2019-06-17 NOTE — Telephone Encounter (Signed)
Patient reports she has developed a dry cough since the EGD. She denies any throat pain or fever. This cough developed 72+ hours after her procedure. She is using cough drops without improvement.  Should she be concerned?

## 2019-06-18 NOTE — Therapy (Signed)
[facility] [address] [place], [place], [address] Phone: [phone]   Fax:  [phone]  Physical Therapy Treatment/ERO  Patient Details  Name: Ann Moran MRN: [id number] Date of Birth: [birth year] Referring Provider (PT): Shan Levans, MD   Encounter Date: 06/17/2019  PT End of Session - 06/17/19 1413    Visit Number  2    Date for PT Re-Evaluation  07/16/19    Authorization Type  MCD submitted 2/24    PT Start Time  1339    PT Stop Time  1413    PT Time Calculation (min)  34 min    Activity Tolerance  Patient tolerated treatment well    Behavior During Therapy  [facility] for tasks assessed/performed       Past Medical History:  Diagnosis Date  . Allergy   . Anemia   . Anxiety   . Chlamydia   . Depression    hx of meds, none currently- doing ok  . Diabetes mellitus without complication (HCC)   . GERD (gastroesophageal reflux disease)   . Infection due to trichomonas   . MRSA (methicillin resistant staph aureus) culture positive    Dec 2012  . Obesity   . Pregnancy induced hypertension   . Urinary tract infection     Past Surgical History:  Procedure Laterality Date  . CHOLECYSTECTOMY    . ECTOPIC PREGNANCY SURGERY      There were no vitals filed for this visit.  Subjective Assessment - 06/17/19 1341    Subjective  The pain is the same, I did my exercises- they changed the pain a little but not much. Sharp pain in lower back and shoulders. Denies HA.    How long can you sit comfortably?  unable with a bra on at all, ok for about 30-40 min without bra on    How long can you stand comfortably?  my feet swell really bad, about an hour    Diagnostic tests  x-ray of cervical spine was negative on 03/04/2019    Patient Stated Goals  Stop hurting,    Currently in Pain?  Yes    Pain Score  5     Pain Location  Back    Pain Orientation  Upper;Lower    Pain Descriptors / Indicators  Sharp    Aggravating  Factors   sitting for long periods    Pain Relieving Factors  brace off and on    Pain Score  5    Pain Location  Knee    Pain Orientation  Right;Left    Pain Descriptors / Indicators  --   uncomfortable   Pain Radiating Towards  bilateral shoulders    Aggravating Factors   sitting with bra on    Pain Relieving Factors  remove bra         [facility] PT Assessment - 06/18/19 0001      Assessment   Medical Diagnosis  low back pain without sciatica, cervicalgia    Referring Provider (PT)  Shan Levans, MD    Onset Date/Surgical Date  04/24/07    Hand Dominance  Right    Prior Therapy  1 eval in Dec      Precautions   Precautions  None      Restrictions   Weight Bearing Restrictions  No      Home Environment   Living Environment  Private residence    Living Arrangements  Children    Type of Home  Apartment    Home Access  Level entry      Prior Function   Level of Independence  Independent    Vocation Requirements  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    Leisure  go to movies, go to park      Cognition   Overall Cognitive Status  Within Functional Limits for tasks assessed      Observation/Other Assessments   Focus on Therapeutic Outcomes (FOTO)   deferred due to MCD       Sensation   Additional Comments  [facility]      Posture/Postural Control   Posture Comments  rounded shoulders, increased lumbar lordosis      AROM   Cervical Flexion  35    Cervical Extension  30    Cervical - Right Side Bend  20    Cervical - Left Side Bend  20    Cervical - Right Rotation  52    Cervical - Left Rotation  48    Lumbar Flexion  mid shin   pain in end range flexion   Lumbar - Right Side Bend  knee joint line    Lumbar - Left Side Bend  knee joint line      Strength   Right Shoulder Flexion  5/5    Right Shoulder Extension  5/5    Right Shoulder ABduction  5/5    Right Shoulder Internal Rotation  5/5    Right Shoulder External Rotation  4/5    Left Shoulder  Flexion  5/5    Left Shoulder Extension  5/5    Left Shoulder ABduction  5/5    Left Shoulder Internal Rotation  5/5    Left Shoulder External Rotation  4/5    Right Hip ABduction  4/5    Left Hip ABduction  4/5      Transfers   Five time sit to stand comments   14s                           PT Education - 06/17/19 1621    Education Details  anatomy of condition, POC, HEP, exercise form/[place]    Person(s) Educated  Patient    Methods  Explanation;Demonstration;Tactile cues;Verbal cues;Handout    Comprehension  Verbalized understanding;Returned demonstration;Verbal cues required;Tactile cues required;Need further instruction       PT Short Term Goals - 06/17/19 1345      PT SHORT TERM GOAL #1   Title  Pt will be independent in her HEP.    Baseline  reports doing HEP    Status  Achieved      PT SHORT TERM GOAL #2   Title  Pt will be able to perform postural correction with sitting and standing.    Baseline  rounded shoulders, tries arching her back to provide support- requires further eduction    Status  On-going      PT SHORT TERM GOAL #3   Title  Pt will be able to pick up objects off the floor with pain less than 3/10 in her low back.    Baseline  avoids due to severe pain    Status  On-going               Plan - 06/17/19 1413    Clinical Impression Statement  Pt presents to PT with complaints of LBP and neck/shoulder pain that is chronic. Mammary hypertrophy placing excessive strain on  posterior musculature. We discussed bra wear and importance of posture. since her evaluation, she has demo improved gross strength and ROM but continues to have very high levels of pain. continued to progress her HEP to incoorporate periscap strengthening and further core strength. Pt will benefit from skilled PT in order to continue to progress toward functional goals.    Comorbidities  HTN, DM, obesity, depression    Examination-Activity Limitations   Carry;Dressing;Lift;Squat;Sit;Sleep    Examination-Participation Restrictions  Community Activity;Driving;Laundry;Cleaning    Rehab Potential  Fair    PT Frequency  --   3 visits in first auth followed by 2/week 6 weeks   PT Treatment/Interventions  ADLs/Self Care Home Management;Cryotherapy;Electrical Stimulation;Moist Heat;Ultrasound;Functional mobility training;Stair training;Gait training;Therapeutic activities;Therapeutic exercise;Balance training;Neuromuscular re-education;Passive range of motion;Dry needling;Taping;Patient/family education;Manual techniques    PT Next Visit Plan  NuStep UE & LE, postural strengthening    PT Home Exercise Plan  Access Code: [email or web address]    Recommended Other Services  sports bra/racer back bra, unlock knees when standing    Consulted and Agree with Plan of Care  Patient       Patient will benefit from skilled therapeutic intervention in order to improve the following deficits and impairments:  Pain, Obesity, Decreased strength, Decreased range of motion, Postural dysfunction, Decreased mobility  Visit Diagnosis: Chronic midline low back pain without sciatica - Plan: PT plan of care cert/re-cert  Cervicalgia - Plan: PT plan of care cert/re-cert  Muscle weakness (generalized) - Plan: PT plan of care cert/re-cert  Postural kyphosis of thoracic region - Plan: PT plan of care cert/re-cert     Problem List Patient Active Problem List   Diagnosis Date Noted  . Dysphagia 05/20/2019  . Encounter for other preprocedural examination 05/20/2019  . PCOS (polycystic ovarian syndrome) 02/24/2019  . Neck pain 02/24/2019  . Morbid obesity with body mass index (BMI) of 45.0 to 49.9 in adult [facility]) 02/24/2019  . Essential hypertension 02/24/2019  . Gastroesophageal reflux disease 02/24/2019  . Irregular menstrual cycle 01/14/2019  . Abnormal uterine bleeding 01/14/2019  . Chronic cough 01/14/2019    Rumaldo Difatta C. Marl Seago PT, DPT 06/18/19 11:06 AM   [facility] [address] [place], [place], [address] Phone: [phone]   Fax:  [phone]  Name: Ann Moran MRN: [id number] Date of Birth: [birth year]

## 2019-06-29 ENCOUNTER — Ambulatory Visit: Payer: Medicaid Other | Attending: Family Medicine | Admitting: Family Medicine

## 2019-06-29 ENCOUNTER — Other Ambulatory Visit: Payer: Self-pay

## 2019-06-29 DIAGNOSIS — I10 Essential (primary) hypertension: Secondary | ICD-10-CM | POA: Diagnosis not present

## 2019-06-29 DIAGNOSIS — L680 Hirsutism: Secondary | ICD-10-CM | POA: Diagnosis not present

## 2019-06-29 DIAGNOSIS — E282 Polycystic ovarian syndrome: Secondary | ICD-10-CM | POA: Diagnosis not present

## 2019-06-29 MED ORDER — NORETHIN ACE-ETH ESTRAD-FE 1-20 MG-MCG(24) PO TABS: 1.0000 | ORAL_TABLET | Freq: Every day | ORAL | 3 refills | Status: DC

## 2019-06-29 MED ORDER — SPIRONOLACTONE 25 MG PO TABS: 25.0000 mg | ORAL_TABLET | Freq: Every day | ORAL | 2 refills | Status: DC

## 2019-06-29 NOTE — Progress Notes (Signed)
Patient has been called and DOB has been verified. Patient has been screened and transferred to PCP to start phone visit.  Needs refill birth control.

## 2019-06-29 NOTE — Progress Notes (Signed)
Virtual Visit via Telephone Note  I connected with Ann Moran, on 06/29/2019 at 2:55 PM by telephone due to the COVID-19 pandemic and verified that I am speaking with the correct person using two identifiers.   Consent: I discussed the limitations, risks, security and privacy concerns of performing an evaluation and management service by telephone and the availability of in person appointments. I also discussed with the patient that there may be a patient responsible charge related to this service. The patient expressed understanding and agreed to proceed.   Location of Patient: Home  Location of Provider: Clinic   Persons participating in Telemedicine visit: Shavell Nored Farrington-CMA Dr. Alvis Lemmings     History of Present Illness: This is a 34 year old female with a history of hypertension, PCOS seen today for follow-up visit.  Last month she had an acute visit for left ear pain secondary to foreign body impaction and reports complete resolution of her symptoms. At her last visit with me 4 months ago had commenced on spironolactone for hirsutism due to her PCOS but she has not noticed any much difference.  She continues to remain on Metformin and Loestrin which she previously received from her GYN. She is compliant with her antihypertensive and denies any additional concerns today.   Past Medical History:  Diagnosis Date  . Allergy   . Anemia   . Anxiety   . Chlamydia   . Depression    hx of meds, none currently- doing ok  . Diabetes mellitus without complication (HCC)   . GERD (gastroesophageal reflux disease)   . Infection due to trichomonas   . MRSA (methicillin resistant staph aureus) culture positive    Dec 2012  . Obesity   . Pregnancy induced hypertension   . Urinary tract infection    No Known Allergies  Current Outpatient Medications on File Prior to Visit  Medication Sig Dispense Refill  . albuterol (VENTOLIN HFA) 108 (90 Base) MCG/ACT inhaler  Inhale 1-2 puffs into the lungs every 6 (six) hours as needed for wheezing or shortness of breath. 18 g 1  . amLODipine (NORVASC) 10 MG tablet Take 1 tablet (10 mg total) by mouth daily. 90 tablet 3  . Blood Pressure Monitoring (BLOOD PRESSURE MONITOR 3) DEVI 1 Units by Does not apply route 2 (two) times daily. Measure blood pressure twice a day and record results 1 each 0  . cyclobenzaprine (FLEXERIL) 10 MG tablet Take 1 tablet (10 mg total) by mouth 3 (three) times daily as needed for muscle spasms. 30 tablet 0  . famotidine (PEPCID) 40 MG tablet Take 1 tablet (40 mg total) by mouth at bedtime. 30 tablet 5  . ibuprofen (ADVIL) 600 MG tablet Take 1 tablet (600 mg total) by mouth every 8 (eight) hours as needed. 30 tablet 0  . losartan (COZAAR) 100 MG tablet Take 1 tablet (100 mg total) by mouth daily. 30 tablet 3  . metFORMIN (GLUCOPHAGE) 500 MG tablet Take 1 tablet (500 mg total) by mouth 2 (two) times daily with a meal. 60 tablet 5  . Norethindrone Acetate-Ethinyl Estrad-FE (LOESTRIN 24 FE) 1-20 MG-MCG(24) tablet Take 1 tablet by mouth daily. 3 Package 3  . pantoprazole (PROTONIX) 40 MG tablet Take 1 tablet (40 mg total) by mouth 2 (two) times daily before a meal. 60 tablet 3  . spironolactone (ALDACTONE) 25 MG tablet Take 1 tablet (25 mg total) by mouth daily. 30 tablet 2  . [DISCONTINUED] fluticasone (FLONASE) 50 MCG/ACT nasal spray Place  2 sprays into both nostrils daily. (Patient not taking: Reported on 04/30/2018) 9.9 g 2  . [DISCONTINUED] medroxyPROGESTERone (PROVERA) 5 MG tablet Take 2 tablets (10 mg total) by mouth daily. (Patient not taking: Reported on 07/20/2018) 14 tablet 0   No current facility-administered medications on file prior to visit.    Observations/Objective: Awake, alert, oriented x3 Not in acute distress  Assessment and Plan: 1. 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  Dispense: 30 tablet; Refill: 2  2. PCOS (polycystic ovarian syndrome) Continue Loestrin and Metformin Keep appointment with GYN - Norethindrone Acetate-Ethinyl Estrad-FE (LOESTRIN 24 FE) 1-20 MG-MCG(24) tablet; Take 1 tablet by mouth daily.  Dispense: 3 Package; Refill: 3  3. Essential hypertension Doing well on losartan/HCTZ Counseled on blood pressure goal of less than 130/80, low-sodium, DASH diet, medication compliance, 150 minutes of moderate intensity exercise per week. Discussed medication compliance, adverse effects. - Basic Metabolic Panel; Future   Follow Up Instructions: Return in about 6 months (around 12/30/2019) for Medical Conditions.    I discussed the assessment and treatment plan with the patient. The patient was provided an opportunity to ask questions and all were answered. The patient agreed with the plan and demonstrated an understanding of the instructions.   The patient was advised to call back or seek an in-person evaluation if the symptoms worsen or if the condition fails to improve as anticipated.   Return in about 6 months (around 12/30/2019) for Medical Conditions.    I provided 13 minutes total of non-face-to-face time during this encounter including median intraservice time, reviewing previous notes, investigations, ordering medications, medical decision making, coordinating care and patient verbalized understanding at the end of the visit.     [name], MD, FAAFP. [facility] [place], [place]   06/29/2019, 2:55 PM

## 2019-07-01 ENCOUNTER — Other Ambulatory Visit: Payer: Medicaid Other

## 2019-07-01 ENCOUNTER — Other Ambulatory Visit: Payer: Self-pay

## 2019-07-01 MED ORDER — CYCLOBENZAPRINE HCL 10 MG PO TABS: 10.0000 mg | ORAL_TABLET | Freq: Three times a day (TID) | ORAL | 0 refills | Status: DC | PRN

## 2019-07-02 ENCOUNTER — Ambulatory Visit (HOSPITAL_COMMUNITY): Admission: RE | Admit: 2019-07-02 | Payer: Medicaid Other | Source: Ambulatory Visit

## 2019-07-02 ENCOUNTER — Other Ambulatory Visit: Payer: Self-pay

## 2019-07-02 ENCOUNTER — Ambulatory Visit (HOSPITAL_COMMUNITY)
Admission: RE | Admit: 2019-07-02 | Discharge: 2019-07-02 | Disposition: A | Discharge status: Home / Self Care | Payer: Medicaid Other | Source: Ambulatory Visit | Attending: Internal Medicine | Admitting: Internal Medicine

## 2019-07-02 DIAGNOSIS — R1110 Vomiting, unspecified: Secondary | ICD-10-CM

## 2019-07-02 MED ORDER — TECHNETIUM TC 99M SULFUR COLLOID
1.9000 | Freq: Once | INTRAVENOUS | Status: AC | PRN
  Administered 2019-07-02: 1.9 via INTRAVENOUS

## 2019-07-06 ENCOUNTER — Ambulatory Visit: Payer: Medicaid Other | Attending: Critical Care Medicine | Admitting: Physical Therapy

## 2019-07-06 ENCOUNTER — Other Ambulatory Visit: Payer: Self-pay

## 2019-07-06 DIAGNOSIS — M542 Cervicalgia: Secondary | ICD-10-CM | POA: Insufficient documentation

## 2019-07-06 DIAGNOSIS — M6281 Muscle weakness (generalized): Secondary | ICD-10-CM | POA: Diagnosis present

## 2019-07-06 DIAGNOSIS — G8929 Other chronic pain: Secondary | ICD-10-CM | POA: Diagnosis present

## 2019-07-06 DIAGNOSIS — M4004 Postural kyphosis, thoracic region: Secondary | ICD-10-CM | POA: Insufficient documentation

## 2019-07-06 DIAGNOSIS — M545 Low back pain: Secondary | ICD-10-CM | POA: Diagnosis present

## 2019-07-06 NOTE — Therapy (Signed)
[place], [place], [address] Phone: [phone]   Fax:  [phone]  Physical Therapy Treatment  Patient Details  Name: Ann Moran MRN: [id number] Date of Birth: 12/09/1985 Referring Provider (PT): Asencion Noble, MD   Encounter Date: 07/06/2019  PT End of Session - 07/06/19 1017    Visit Number  3    Date for PT Re-Evaluation  07/16/19    Authorization Type  MCD submitted 2/24    PT Start Time  1017    PT Stop Time  1056    PT Time Calculation (min)  39 min    Activity Tolerance  Patient tolerated treatment well    Behavior During Therapy  [facility] for tasks assessed/performed       Past Medical History:  Diagnosis Date  . Allergy   . Anemia   . Anxiety   . Chlamydia   . Depression    hx of meds, none currently- doing ok  . Diabetes mellitus without complication ([place])   . GERD (gastroesophageal reflux disease)   . Infection due to trichomonas   . MRSA (methicillin resistant staph aureus) culture positive    Dec 2012  . Obesity   . Pregnancy induced hypertension   . Urinary tract infection     Past Surgical History:  Procedure Laterality Date  . CHOLECYSTECTOMY    . ECTOPIC PREGNANCY SURGERY      There were no vitals filed for this visit.  Subjective Assessment - 07/06/19 1018    Subjective  Doing the exercises without change in pain. Low back feels really tight. Has not tried using stretches and exercises to reduce pain when she is hurting    Patient Stated Goals  Stop hurting,    Currently in Pain?  Yes    Pain Score  7     Pain Location  Back    Pain Orientation  Lower    Pain Descriptors / Indicators  Tightness    Aggravating Factors   dishes                       [facility] Adult PT Treatment/Exercise - 07/06/19 0001      Exercises   Exercises  Lumbar      Lumbar Exercises: Stretches   Passive Hamstring Stretch Limitations  30s each supine with strap    Lower Trunk Rotation  5 reps   each   Quadruped Mid Back Stretch Limitations  cat camel child pose    Piriformis Stretch Limitations  supine figure 4    Other Lumbar Stretch Exercise  sinke pull off    Other Lumbar Stretch Exercise  physioball flexion stretch      Lumbar Exercises: Aerobic   Nustep  5 min L5 Ue & Le      Lumbar Exercises: Supine   Ab Set  10 reps    Other Supine Lumbar Exercises  ab set with marching    Other Supine Lumbar Exercises  SLR- core tight with arms reaching up      Lumbar Exercises: Quadruped   Straight Leg Raise  20 reps    Straight Leg Raises Limitations  both      Manual Therapy   Manual Therapy  Soft tissue mobilization    Soft tissue mobilization  paraspinals bilat lower thoracic-upper lumbar, manual prone lumbar traction             PT Education - 07/06/19 1058  Education Details  see plan    Person(s) Educated  Patient    Methods  Explanation    Comprehension  Verbalized understanding;Need further instruction       PT Short Term Goals - 06/17/19 1345      PT SHORT TERM GOAL #1   Title  Pt will be independent in her HEP.    Baseline  reports doing HEP    Status  Achieved      PT SHORT TERM GOAL #2   Title  Pt will be able to perform postural correction with sitting and standing.    Baseline  rounded shoulders, tries arching her back to provide support- requires further eduction    Status  On-going      PT SHORT TERM GOAL #3   Title  Pt will be able to pick up objects off the floor with pain less than 3/10 in her low back.    Baseline  avoids due to severe pain    Status  On-going               Plan - 07/06/19 1057    Clinical Impression Statement  Signifiant tightness noted in paraspinals that was reduced with manual therapy. Discussed importance of stretching regularly rather than waiting for pain to get bad. will continue with core strengthening for support.    PT Treatment/Interventions  ADLs/Self Care Home  Management;Cryotherapy;Electrical Stimulation;Moist Heat;Ultrasound;Functional mobility training;Stair training;Gait training;Therapeutic activities;Therapeutic exercise;Balance training;Neuromuscular re-education;Passive range of motion;Dry needling;Taping;Patient/family education;Manual techniques    PT Next Visit Plan  progress core strength, did she stretch regularly    PT Home Exercise Plan  Access Code: [email or web address]    Consulted and Agree with Plan of Care  Patient       Patient will benefit from skilled therapeutic intervention in order to improve the following deficits and impairments:  Pain, Obesity, Decreased strength, Decreased range of motion, Postural dysfunction, Decreased mobility  Visit Diagnosis: Chronic midline low back pain without sciatica  Cervicalgia  Muscle weakness (generalized)     Problem List Patient Active Problem List   Diagnosis Date Noted  . Dysphagia 05/20/2019  . Encounter for other preprocedural examination 05/20/2019  . PCOS (polycystic ovarian syndrome) 02/24/2019  . Neck pain 02/24/2019  . Morbid obesity with body mass index (BMI) of 45.0 to 49.9 in adult [facility]) 02/24/2019  . Essential hypertension 02/24/2019  . Gastroesophageal reflux disease 02/24/2019  . Irregular menstrual cycle 01/14/2019  . Abnormal uterine bleeding 01/14/2019  . Chronic cough 01/14/2019    Ann Moran PT, DPT 07/06/19 11:00 AM   [facility] [address] [place], [place], [address] Phone: [phone]   Fax:  [phone]  Name: Ann Moran MRN: [id number] Date of Birth: February 15, 1986

## 2019-07-08 ENCOUNTER — Ambulatory Visit: Payer: Medicaid Other | Admitting: Physical Therapy

## 2019-07-15 ENCOUNTER — Ambulatory Visit: Payer: Medicaid Other | Admitting: Physical Therapy

## 2019-07-16 ENCOUNTER — Ambulatory Visit: Payer: Medicaid Other | Admitting: Physical Therapy

## 2019-07-16 ENCOUNTER — Encounter: Payer: Self-pay | Admitting: Physical Therapy

## 2019-07-16 ENCOUNTER — Other Ambulatory Visit: Payer: Self-pay

## 2019-07-16 DIAGNOSIS — M6281 Muscle weakness (generalized): Secondary | ICD-10-CM

## 2019-07-16 DIAGNOSIS — M4004 Postural kyphosis, thoracic region: Secondary | ICD-10-CM

## 2019-07-16 DIAGNOSIS — M545 Low back pain: Secondary | ICD-10-CM | POA: Diagnosis not present

## 2019-07-16 DIAGNOSIS — M542 Cervicalgia: Secondary | ICD-10-CM

## 2019-07-16 DIAGNOSIS — G8929 Other chronic pain: Secondary | ICD-10-CM

## 2019-07-16 NOTE — Therapy (Signed)
[facility] [address] [place], [place], [address] Phone: [phone]   Fax:  [phone]  Physical Therapy Treatment/ERO  Patient Details  Name: Ann Moran MRN: [id number] Date of Birth: October 29, 1985 Referring Provider (PT): Shan Levans, MD   Encounter Date: 07/16/2019  PT End of Session - 07/16/19 0001    Visit Number  4    Date for PT Re-Evaluation  09/10/19    Authorization Type  MCD submitted 3/25    PT Start Time  1643   Patient arrived late   PT Stop Time  1710    PT Time Calculation (min)  27 min    Activity Tolerance  Patient tolerated treatment well    Behavior During Therapy  [facility] for tasks assessed/performed       Past Medical History:  Diagnosis Date  . Allergy   . Anemia   . Anxiety   . Chlamydia   . Depression    hx of meds, none currently- doing ok  . Diabetes mellitus without complication (HCC)   . GERD (gastroesophageal reflux disease)   . Infection due to trichomonas   . MRSA (methicillin resistant staph aureus) culture positive    Dec 2012  . Obesity   . Pregnancy induced hypertension   . Urinary tract infection     Past Surgical History:  Procedure Laterality Date  . CHOLECYSTECTOMY    . ECTOPIC PREGNANCY SURGERY      There were no vitals filed for this visit.  Subjective Assessment - 07/16/19 1643    Subjective  Wants to continue with PT. Patient feels as if she hasn't gotten better because she hasn't been able to make appointments. Nothing has gotten worse. 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  Can sit for hours    How long can you stand comfortably?  my feet swell really bad, about an hour    How long can you walk comfortably?  Can walk for15 mins, but has to a lot     Diagnostic tests  x-ray of cervical spine was negative on 03/04/2019    Patient Stated Goals  Stop hurting,    Currently in Pain?  Yes    Pain Score  7     Pain Location  Back    Pain Orientation  Lower    Pain Descriptors / Indicators  Tightness    Pain Type  Chronic pain    Pain Onset  More than a month ago    Pain Frequency  Constant    Aggravating Factors   Dishes, Walking    Pain Relieving Factors  Stretches are helpful    Multiple Pain Sites  Yes    Pain Score  7    Pain Location  Neck    Pain Orientation  Mid    Pain Descriptors / Indicators  Tightness    Pain Type  Chronic pain    Pain Radiating Towards  bilateral shoulders    Pain Onset  More than a month ago    Pain Frequency  Constant    Aggravating Factors   Bra    Pain Relieving Factors  Stretches have been helpful         [facility] PT Assessment - 07/16/19 0001  Assessment   Medical Diagnosis  low back pain without sciatica, cervicalgia    Referring Provider (PT)  Shan Levans, MD    Onset Date/Surgical Date  04/24/07    Hand Dominance  Right      Precautions   Precautions  None      Restrictions   Weight Bearing Restrictions  No      Home Environment   Living Environment  Private residence    Living Arrangements  Children    Type of Home  Apartment    Home Access  Level entry      Prior Function   Level of Independence  Independent    Vocation Requirements  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    Leisure  go to movies, go to park      Cognition   Overall Cognitive Status  Within Functional Limits for tasks assessed      Observation/Other Assessments   Focus on Therapeutic Outcomes (FOTO)   deferred due to MCD       Sensation   Additional Comments  [facility]      Posture/Postural Control   Posture Comments  rounded shoulders & incr kyphosis, forward head, mammary hypertrophy      AROM   Cervical Flexion  19    Cervical Extension  25    Cervical - Right Side Bend  38     Cervical - Left Side Bend  40    Cervical - Right Rotation  45    Cervical - Left Rotation  48    Lumbar Flexion  mid shin    pain in thoracic spine at end range   Lumbar Extension  <50%    Lumbar - Right Side Bend  just above knee joint line    Lumbar - Left Side Bend  just above knee joint line      Strength   Right Shoulder External Rotation  4/5    Left Shoulder External Rotation  4/5    Right Hip ABduction  4/5    Left Hip ABduction  4/5      Transfers   Five time sit to stand comments   14s                   [facility] Adult PT Treatment/Exercise - 07/16/19 0001      Lumbar Exercises: Stretches   Other Lumbar Stretch Exercise  seated upper trap stretch      Manual Therapy   Soft tissue mobilization  Upper Traps and Suboccipitals             PT Education - 07/16/19 1725    Education Details  Discussed patients goals and functional ROMs for driving and household activities    Person(s) Educated  Patient    Methods  Explanation    Comprehension  Verbalized understanding       PT Short Term Goals - 07/16/19 1738      PT SHORT TERM GOAL #1   Title  Pt will be independent in her HEP.    Status  Achieved      PT SHORT TERM GOAL #2   Title  Pt will be able to perform postural correction with sitting and standing.    Baseline  I am trying but it hurts    Status  On-going      PT SHORT TERM GOAL #3   Title  Pt will be able to pick up objects off the floor with  pain less than 3/10 in her low back.    Status  Achieved        PT Long Term Goals - 07/16/19 1651      PT LONG TERM GOAL #1   Title  Patient will be able to stand longer than 15 mins    Baseline  Patient is able to stand for 15 mins max    Time  8    Period  Weeks    Status  New    Target Date  09/10/19      PT LONG TERM GOAL #2   Title  Will be able to bend over more than 10 times with a 3/10 pain    Baseline  Can't bend over more than 4 times without pain    Time  8    Period   Weeks    Status  New    Target Date  09/10/19      PT LONG TERM GOAL #3   Title  Patient will be able to achieve 60 degrees of cervical rotation    Baseline  See flowsheet    Time  8    Period  Weeks    Status  New    Target Date  09/10/19      PT LONG TERM GOAL #4   Title  --    Baseline  --    Time  --    Period  --    Status  --    Target Date  --            Plan - 07/16/19 1714    Clinical Impression Statement  Patient had a lot of tension in the upper traps. She has made some improvements in cervical ROM, but still has a great amount of limitations. Also presents limitations in lumbar ROM resutling in pain and limited tolerance to ADLs. She reports that she has been doing her HEPs. Patients would benefit from skilled PT in order to further increase ROM and to decrease pain to perform household activities.    Personal Factors and Comorbidities  Comorbidity 3+    Comorbidities  HTN, DM, obesity, depression    Examination-Activity Limitations  Carry;Dressing;Lift;Squat;Sit;Sleep;Bend;Stand    Examination-Participation Restrictions  Community Activity;Driving;Laundry;Cleaning    Stability/Clinical Decision Making  Evolving/Moderate complexity    Clinical Decision Making  Moderate    Rehab Potential  Fair    PT Frequency  2x / week    PT Duration  8 weeks    PT Treatment/Interventions  ADLs/Self Care Home Management;Cryotherapy;Electrical Stimulation;Moist Heat;Ultrasound;Functional mobility training;Stair training;Gait training;Therapeutic activities;Therapeutic exercise;Balance training;Neuromuscular re-education;Passive range of motion;Dry needling;Taping;Patient/family education;Manual techniques    PT Next Visit Plan  Core strength, stretches, Strengtening hip, and scapular muscles    PT Home Exercise Plan  Access Code: [email or web address]    Consulted and Agree with Plan of Care  Patient       Patient will benefit from skilled therapeutic intervention in order to improve the  following deficits and impairments:  Pain, Obesity, Decreased strength, Decreased range of motion, Postural dysfunction, Decreased mobility, Difficulty walking  Visit Diagnosis: Chronic midline low back pain without sciatica - Plan: PT plan of care cert/re-cert  Cervicalgia - Plan: PT plan of care cert/re-cert  Muscle weakness (generalized) - Plan: PT plan of care cert/re-cert  Postural kyphosis of thoracic region - Plan: PT plan of care cert/re-cert     Problem List Patient Active Problem List   Diagnosis Date Noted  . Dysphagia 05/20/2019  .  Encounter for other preprocedural examination 05/20/2019  . PCOS (polycystic ovarian syndrome) 02/24/2019  . Neck pain 02/24/2019  . Morbid obesity with body mass index (BMI) of 45.0 to 49.9 in adult [facility]) 02/24/2019  . Essential hypertension 02/24/2019  . Gastroesophageal reflux disease 02/24/2019  . Irregular menstrual cycle 01/14/2019  . Abnormal uterine bleeding 01/14/2019  . Chronic cough 01/14/2019   Cato Mulligan, SPT  Bereket Gernert C. Esau Fridman PT, DPT 07/16/19 5:40 PM  During this treatment session, the therapist was present, participating in and directing the treatment.  [facility] [address] [place], [place], [address] Phone: [phone]   Fax:  [phone]  Name: ROBI MITTER MRN: [id number] Date of Birth: [birth year]

## 2019-07-23 ENCOUNTER — Ambulatory Visit: Payer: Medicaid Other

## 2019-07-28 ENCOUNTER — Other Ambulatory Visit: Payer: Self-pay

## 2019-07-28 ENCOUNTER — Ambulatory Visit: Payer: Medicaid Other | Attending: Critical Care Medicine | Admitting: Physical Therapy

## 2019-07-28 ENCOUNTER — Encounter: Payer: Self-pay | Admitting: Physical Therapy

## 2019-07-28 DIAGNOSIS — M542 Cervicalgia: Secondary | ICD-10-CM | POA: Diagnosis present

## 2019-07-28 DIAGNOSIS — M545 Low back pain, unspecified: Secondary | ICD-10-CM

## 2019-07-28 DIAGNOSIS — G8929 Other chronic pain: Secondary | ICD-10-CM | POA: Insufficient documentation

## 2019-07-28 DIAGNOSIS — M6281 Muscle weakness (generalized): Secondary | ICD-10-CM | POA: Diagnosis present

## 2019-07-28 DIAGNOSIS — M4004 Postural kyphosis, thoracic region: Secondary | ICD-10-CM

## 2019-07-28 NOTE — Therapy (Signed)
[facility] [address] [place], [place], [address] Phone: [phone]   Fax:  [phone]  Physical Therapy Treatment  Patient Details  Name: Ann Moran MRN: [id number] Date of Birth: [birth year] Referring Provider (PT): Shan Levans, MD   Encounter Date: 07/28/2019  PT End of Session - 07/28/19 0948    Visit Number  5    Number of Visits  16    Date for PT Re-Evaluation  09/10/19    Authorization Type  MCD submitted 3/25 approved for 12 visits from 4/6 to 09/07/2019    Authorization - Visit Number  1    Authorization - Number of Visits  12    PT Start Time  [phone]    PT Stop Time  1015    PT Time Calculation (min)  33 min    Activity Tolerance  Patient tolerated treatment well    Behavior During Therapy  [facility] for tasks assessed/performed       Past Medical History:  Diagnosis Date  . Allergy   . Anemia   . Anxiety   . Chlamydia   . Depression    hx of meds, none currently- doing ok  . Diabetes mellitus without complication (HCC)   . GERD (gastroesophageal reflux disease)   . Infection due to trichomonas   . MRSA (methicillin resistant staph aureus) culture positive    Dec 2012  . Obesity   . Pregnancy induced hypertension   . Urinary tract infection     Past Surgical History:  Procedure Laterality Date  . CHOLECYSTECTOMY    . ECTOPIC PREGNANCY SURGERY      There were no vitals filed for this visit.  Subjective Assessment - 07/28/19 0945    Subjective  Pt arriving to therapy 12 minutes late reporting 7/10 pain  in her low back. Pt reporting doing her HEP and it's going well.    Pertinent History  HTN and DM per pt report, Depression    Limitations  Standing;Walking;House hold activities;Lifting    How long can you sit comfortably?  Can sit for hours    How long can you stand comfortably?  my feet swell really bad, about an hour    How long can you walk comfortably?  Can walk for15 mins, but has to a lot     Diagnostic tests  x-ray of cervical spine was negative on 03/04/2019    Patient Stated Goals  Stop hurting,    Currently in Pain?  Yes    Pain Score  7     Pain Location  Back    Pain Orientation  Lower    Pain Descriptors / Indicators  Tightness;Aching    Pain Type  Chronic pain    Pain Onset  More than a month ago                       [facility] Adult PT Treatment/Exercise - 07/28/19 0001      Exercises   Exercises  Lumbar      Lumbar Exercises: Stretches   Active Hamstring Stretch  Right;Left;2 reps;20 seconds    Lower Trunk Rotation  5 reps      Lumbar Exercises: Aerobic   Nustep  5 minutes. L4      Lumbar Exercises: Supine   Ab Set  10 reps    Pelvic Tilt  10 reps    Bridge  10 reps;5 seconds    Other Supine Lumbar Exercises  SLR:  x 10 each LE cues for core activation      Lumbar Exercises: Sidelying   Clam  Both;15 reps      Lumbar Exercises: Quadruped   Madcat/Old Horse  5 reps;Limitations    Madcat/Old Horse Limitations  instructions for pelvic control and dissociation    Straight Leg Raise  15 reps    Straight Leg Raises Limitations  bilateral LE's decreased hip extension             PT Education - 07/28/19 0947    Education Details  Exercise techniques    Person(s) Educated  Patient    Methods  Explanation;Demonstration    Comprehension  Verbalized understanding;Returned demonstration       PT Short Term Goals - 07/28/19 0954      PT SHORT TERM GOAL #1   Title  Pt will be independent in her HEP.    Baseline  reports doing HEP    Time  4    Period  Weeks    Status  Achieved      PT SHORT TERM GOAL #2   Title  Pt will be able to perform postural correction with sitting and standing.    Baseline  I am trying but it hurts    Time  4    Period  Weeks    Status  On-going    Target Date  05/04/19      PT SHORT TERM GOAL #3   Title  Pt will be able to pick up objects off the floor with pain less than 3/10 in her low back.     Baseline  avoids due to severe pain    Time  4    Period  Weeks    Status  Achieved        PT Long Term Goals - 07/28/19 7124      PT LONG TERM GOAL #1   Title  Patient will be able to stand longer than 15 mins    Baseline  Patient is able to stand for 15 mins max    Time  8    Period  Weeks    Status  On-going      PT LONG TERM GOAL #2   Title  Will be able to bend over more than 10 times with a 3/10 pain    Baseline  Can't bend over more than 4 times without pain    Status  On-going      PT LONG TERM GOAL #3   Title  Patient will be able to achieve 60 degrees of cervical rotation    Baseline  See flowsheet    Time  8    Period  Weeks    Status  On-going            Plan - 07/28/19 5809    Clinical Impression Statement  Pt tolerating treatment well. Pt reporting more pain today in her low back so execises were concentrated on core strengthening and lumbar stretching. Pt was approved for 12 visits from 07/28/2019 to 09/07/2019. Pt was edu in abdominal stabilization and exercise technique. Continue to progress toward goals set with skilled PT.    Personal Factors and Comorbidities  Comorbidity 3+    Comorbidities  HTN, DM, obesity, depression    Examination-Activity Limitations  Carry;Dressing;Lift;Squat;Sit;Sleep;Bend;Stand    Stability/Clinical Decision Making  Evolving/Moderate complexity    Rehab Potential  Fair    PT Frequency  2x / week    PT Duration  8 weeks    PT Treatment/Interventions  ADLs/Self Care Home Management;Cryotherapy;Electrical Stimulation;Moist Heat;Ultrasound;Functional mobility training;Stair training;Gait training;Therapeutic activities;Therapeutic exercise;Balance training;Neuromuscular re-education;Passive range of motion;Dry needling;Taping;Patient/family education;Manual techniques    PT Next Visit Plan  Core strength, stretches, Strengtening hip, and scapular muscles    PT Home Exercise Plan  Access Code: [email or web address]    Consulted and Agree with  Plan of Care  Patient       Patient will benefit from skilled therapeutic intervention in order to improve the following deficits and impairments:  Pain, Obesity, Decreased strength, Decreased range of motion, Postural dysfunction, Decreased mobility, Difficulty walking  Visit Diagnosis: Chronic midline low back pain without sciatica  Cervicalgia  Muscle weakness (generalized)  Postural kyphosis of thoracic region     Problem List Patient Active Problem List   Diagnosis Date Noted  . Dysphagia 05/20/2019  . Encounter for other preprocedural examination 05/20/2019  . PCOS (polycystic ovarian syndrome) 02/24/2019  . Neck pain 02/24/2019  . Morbid obesity with body mass index (BMI) of 45.0 to 49.9 in adult [facility]) 02/24/2019  . Essential hypertension 02/24/2019  . Gastroesophageal reflux disease 02/24/2019  . Irregular menstrual cycle 01/14/2019  . Abnormal uterine bleeding 01/14/2019  . Chronic cough 01/14/2019    Oretha Caprice, MPT 07/28/2019, 10:11 AM  [facility] [address] [place], [place], [address] Phone: [phone]   Fax:  [phone]  Name: Ann Moran MRN: [id number] Date of Birth: [birth year]

## 2019-07-31 ENCOUNTER — Ambulatory Visit: Payer: Medicaid Other | Admitting: Physical Therapy

## 2019-07-31 ENCOUNTER — Telehealth: Payer: Self-pay | Admitting: Physical Therapy

## 2019-07-31 NOTE — Telephone Encounter (Signed)
I called pt to follow up about her missed visit on 07/31/2019 at 1015. I left a reminder  about pt's upcoming appointment on Monday at 2:15pm with Army Fossa, DPT.   Narda Amber, PT 07/31/19 10:47 AM

## 2019-08-03 ENCOUNTER — Ambulatory Visit: Payer: Medicaid Other | Admitting: Physical Therapy

## 2019-08-04 ENCOUNTER — Telehealth: Payer: Self-pay | Admitting: Physical Therapy

## 2019-08-04 NOTE — Telephone Encounter (Signed)
Left VM regarding NS from 4/12 @ 2:15. Advised this is her 2nd NS and, per our attendance policy, next appointment will be left on schedule but the rest will be taken off and she will be allowed to schedule 1 appointment at a time. Requested she contact us with any further questions.  Stepfanie Yott C. Camron Essman PT, DPT 08/04/19 11:59 AM

## 2019-08-05 ENCOUNTER — Encounter: Payer: Self-pay | Admitting: Physical Therapy

## 2019-08-05 ENCOUNTER — Ambulatory Visit: Payer: Medicaid Other | Admitting: Physical Therapy

## 2019-08-05 ENCOUNTER — Other Ambulatory Visit: Payer: Self-pay

## 2019-08-05 DIAGNOSIS — M545 Low back pain: Secondary | ICD-10-CM | POA: Diagnosis not present

## 2019-08-05 DIAGNOSIS — M542 Cervicalgia: Secondary | ICD-10-CM

## 2019-08-05 DIAGNOSIS — M4004 Postural kyphosis, thoracic region: Secondary | ICD-10-CM

## 2019-08-05 DIAGNOSIS — M6281 Muscle weakness (generalized): Secondary | ICD-10-CM

## 2019-08-05 DIAGNOSIS — G8929 Other chronic pain: Secondary | ICD-10-CM

## 2019-08-05 NOTE — Therapy (Signed)
[facility] [address] [place], [place], [address] Phone: [phone]   Fax:  [phone]  Physical Therapy Treatment  Patient Details  Name: Ann Moran MRN: [id number] Date of Birth: Aug 12, 1985 Referring Provider (PT): Shan Levans, MD   Encounter Date: 08/05/2019  PT End of Session - 08/05/19 1110    Visit Number  6    Number of Visits  16    Date for PT Re-Evaluation  09/10/19    Authorization Type  MCD submitted 3/25 approved for 12 visits from 4/6 to 09/07/2019    Authorization - Visit Number  2    Authorization - Number of Visits  12    PT Start Time  1110   Pt. arrived late   PT Stop Time  1142    PT Time Calculation (min)  32 min    Activity Tolerance  Patient tolerated treatment well    Behavior During Therapy  [facility] for tasks assessed/performed       Past Medical History:  Diagnosis Date  . Allergy   . Anemia   . Anxiety   . Chlamydia   . Depression    hx of meds, none currently- doing ok  . Diabetes mellitus without complication (HCC)   . GERD (gastroesophageal reflux disease)   . Infection due to trichomonas   . MRSA (methicillin resistant staph aureus) culture positive    Dec 2012  . Obesity   . Pregnancy induced hypertension   . Urinary tract infection     Past Surgical History:  Procedure Laterality Date  . CHOLECYSTECTOMY    . ECTOPIC PREGNANCY SURGERY      There were no vitals filed for this visit.  Subjective Assessment - 08/05/19 1110    Subjective  Patient reports that she is having a lot of pain in her neck and shoulders. She isn't in great spirits and believes that stress has been contributing to pain. She felt good after the last session.    Pertinent History  HTN and DM per pt report, Depression    Limitations  Standing;Walking;House hold activities;Lifting    How long can you sit comfortably?  Can sit for hours    How long can you stand comfortably?  my feet swell really bad,  about an hour    How long can you walk comfortably?  Can walk for15 mins, but has to a lot    Diagnostic tests  x-ray of cervical spine was negative on 03/04/2019    Patient Stated Goals  Stop hurting,    Currently in Pain?  Yes    Pain Score  8    Notes that is may be due to stress   Pain Location  Back    Pain Orientation  Lower    Pain Descriptors / Indicators  Aching;Tightness    Pain Type  Chronic pain    Pain Onset  More than a month ago    Pain Frequency  Constant    Aggravating Factors   Dishes, Walking    Pain Relieving Factors  Stretches are helpful    Effect of Pain on Daily Activities  difficutly sleeping, diffculty lifting groceries, putting on shoes, picking things up off floor, getting in/out of truck    Multiple Pain Sites  Yes    Pain Score  9    Pain Location  Neck    Pain Orientation  Mid    Pain Descriptors / Indicators  Tightness    Pain Type  Chronic pain    Pain Radiating Towards  bilateral shoulders    Pain Onset  More than a month ago    Pain Frequency  Constant    Aggravating Factors   Bra    Pain Relieving Factors  Stretches have been helpful                       [facility] Adult PT Treatment/Exercise - 08/05/19 0001      Exercises   Exercises  Shoulder;Neck      Neck Exercises: Supine   Neck Retraction  20 reps    Other Supine Exercise  DNF holds for 20 secs, 3 sets      Lumbar Exercises: Stretches   Passive Hamstring Stretch  Right;Left;1 rep;30 seconds      Lumbar Exercises: Supine   Other Supine Lumbar Exercises  Pressing physio ball down into abdomen in hooklying     Other Supine Lumbar Exercises  Pressing physio ball down into abdomen in hooklying  w/ table top marches      Shoulder Exercises: Supine   Protraction  AROM;Both;20 reps    Other Supine Exercises  Back strokes w/ core engaged      Manual Therapy   Manual Therapy  Joint mobilization    Joint Mobilization  Grade V LAD on Rt. leg     Soft tissue mobilization   Piriformis Trigger Point Release              PT Education - 08/05/19 1329    Education Details  Patient educated on SIJ dysfunctions and trigger points    Person(s) Educated  Patient    Methods  Explanation;Demonstration    Comprehension  Verbalized understanding;Need further instruction       PT Short Term Goals - 07/28/19 0954      PT SHORT TERM GOAL #1   Title  Pt will be independent in her HEP.    Baseline  reports doing HEP    Time  4    Period  Weeks    Status  Achieved      PT SHORT TERM GOAL #2   Title  Pt will be able to perform postural correction with sitting and standing.    Baseline  I am trying but it hurts    Time  4    Period  Weeks    Status  On-going    Target Date  05/04/19      PT SHORT TERM GOAL #3   Title  Pt will be able to pick up objects off the floor with pain less than 3/10 in her low back.    Baseline  avoids due to severe pain    Time  4    Period  Weeks    Status  Achieved        PT Long Term Goals - 07/28/19 3976      PT LONG TERM GOAL #1   Title  Patient will be able to stand longer than 15 mins    Baseline  Patient is able to stand for 15 mins max    Time  8    Period  Weeks    Status  On-going      PT LONG TERM GOAL #2   Title  Will be able to bend over more than 10 times with a 3/10 pain    Baseline  Can't bend over more than 4 times without pain    Status  On-going  PT LONG TERM GOAL #3   Title  Patient will be able to achieve 60 degrees of cervical rotation    Baseline  See flowsheet    Time  8    Period  Weeks    Status  On-going            Plan - 08/05/19 1323    Clinical Impression Statement  Patient presents to the clinic with increased back and neck pain. She does attribute her increase in pain to stress. She tolerated treatment well. She indicated a great amount of pain around her SIJ. She reports that she hasn't been doing her exercises. She was highly encouraged to do all of her exercises until  the next visit to see if her pain decreases.    Comorbidities  HTN, DM, obesity, depression    Examination-Activity Limitations  Carry;Dressing;Lift;Squat;Sit;Sleep;Bend;Stand    Examination-Participation Restrictions  Community Activity;Driving;Laundry;Cleaning    Stability/Clinical Decision Making  Evolving/Moderate complexity    Clinical Decision Making  Moderate    Rehab Potential  Fair    PT Frequency  2x / week    PT Duration  8 weeks    PT Treatment/Interventions  ADLs/Self Care Home Management;Cryotherapy;Electrical Stimulation;Moist Heat;Ultrasound;Functional mobility training;Stair training;Gait training;Therapeutic activities;Therapeutic exercise;Balance training;Neuromuscular re-education;Passive range of motion;Dry needling;Taping;Patient/family education;Manual techniques    PT Next Visit Plan  Core strength, stretches, Strengtening hip, and scapular muscles    PT Home Exercise Plan  Access Code: [email or web address]    Consulted and Agree with Plan of Care  Patient       Patient will benefit from skilled therapeutic intervention in order to improve the following deficits and impairments:  Pain, Obesity, Decreased strength, Decreased range of motion, Postural dysfunction, Decreased mobility, Difficulty walking  Visit Diagnosis: Chronic midline low back pain without sciatica  Cervicalgia  Muscle weakness (generalized)  Postural kyphosis of thoracic region     Problem List Patient Active Problem List   Diagnosis Date Noted  . Dysphagia 05/20/2019  . Encounter for other preprocedural examination 05/20/2019  . PCOS (polycystic ovarian syndrome) 02/24/2019  . Neck pain 02/24/2019  . Morbid obesity with body mass index (BMI) of 45.0 to 49.9 in adult [facility]) 02/24/2019  . Essential hypertension 02/24/2019  . Gastroesophageal reflux disease 02/24/2019  . Irregular menstrual cycle 01/14/2019  . Abnormal uterine bleeding 01/14/2019  . Chronic cough 01/14/2019    Laveda Norman,  SPT 08/05/2019, 1:35 PM  [facility] [address] [place], [place], [address] Phone: [phone]   Fax:  [phone]  Name: Ann Moran MRN: [id number] Date of Birth: 09-10-1985

## 2019-08-10 ENCOUNTER — Encounter: Payer: Medicaid Other | Admitting: Physical Therapy

## 2019-08-10 ENCOUNTER — Encounter: Payer: Self-pay | Admitting: Physical Therapy

## 2019-08-12 ENCOUNTER — Encounter: Payer: Medicaid Other | Admitting: Physical Therapy

## 2019-08-17 ENCOUNTER — Encounter: Payer: Medicaid Other | Admitting: Physical Therapy

## 2019-08-19 ENCOUNTER — Encounter: Payer: Medicaid Other | Admitting: Physical Therapy

## 2019-08-23 ENCOUNTER — Encounter: Payer: Self-pay | Admitting: Family Medicine

## 2019-08-24 ENCOUNTER — Encounter: Payer: Medicaid Other | Admitting: Physical Therapy

## 2019-08-24 ENCOUNTER — Other Ambulatory Visit: Payer: Self-pay | Admitting: Family Medicine

## 2019-08-24 ENCOUNTER — Ambulatory Visit: Payer: Medicaid Other | Admitting: Physical Therapy

## 2019-08-24 MED ORDER — CETIRIZINE HCL 10 MG PO TABS: 10.0000 mg | ORAL_TABLET | Freq: Every day | ORAL | 1 refills | Status: DC

## 2019-08-24 MED ORDER — OLOPATADINE HCL 0.1 % OP SOLN: 1.0000 [drp] | Freq: Two times a day (BID) | OPHTHALMIC | 1 refills | Status: DC

## 2019-08-24 MED ORDER — CYCLOBENZAPRINE HCL 10 MG PO TABS: 10.0000 mg | ORAL_TABLET | Freq: Three times a day (TID) | ORAL | 0 refills | Status: DC | PRN

## 2019-08-26 ENCOUNTER — Encounter: Payer: Medicaid Other | Admitting: Physical Therapy

## 2019-08-31 ENCOUNTER — Ambulatory Visit: Payer: Medicaid Other | Attending: Critical Care Medicine | Admitting: Physical Therapy

## 2019-08-31 ENCOUNTER — Encounter: Payer: Self-pay | Admitting: Physical Therapy

## 2019-08-31 ENCOUNTER — Other Ambulatory Visit: Payer: Self-pay

## 2019-08-31 DIAGNOSIS — G8929 Other chronic pain: Secondary | ICD-10-CM

## 2019-08-31 DIAGNOSIS — M542 Cervicalgia: Secondary | ICD-10-CM

## 2019-08-31 DIAGNOSIS — M6281 Muscle weakness (generalized): Secondary | ICD-10-CM | POA: Diagnosis present

## 2019-08-31 DIAGNOSIS — M4004 Postural kyphosis, thoracic region: Secondary | ICD-10-CM | POA: Diagnosis present

## 2019-08-31 DIAGNOSIS — M545 Low back pain: Secondary | ICD-10-CM | POA: Insufficient documentation

## 2019-08-31 NOTE — Therapy (Addendum)
[place], [place], [address] Phone: [phone]   Fax:  [phone]  Physical Therapy Treatment/Discharge  Patient Details  Name: Ann Moran MRN: [id number] Date of Birth: 1986-02-02 Referring Provider (PT): Asencion Noble, MD   Encounter Date: 08/31/2019  PT End of Session - 08/31/19 0939    Visit Number  7    Number of Visits  16    Date for PT Re-Evaluation  09/10/19    Authorization Type  MCD submitted 3/25 approved for 12 visits from 4/6 to 09/07/2019    Authorization - Visit Number  3    Authorization - Number of Visits  12    PT Start Time  0940    PT Stop Time  1019    PT Time Calculation (min)  39 min    Activity Tolerance  Patient tolerated treatment well       Past Medical History:  Diagnosis Date  . Allergy   . Anemia   . Anxiety   . Chlamydia   . Depression    hx of meds, none currently- doing ok  . Diabetes mellitus without complication ([place])   . GERD (gastroesophageal reflux disease)   . Infection due to trichomonas   . MRSA (methicillin resistant staph aureus) culture positive    Dec 2012  . Obesity   . Pregnancy induced hypertension   . Urinary tract infection     Past Surgical History:  Procedure Laterality Date  . CHOLECYSTECTOMY    . ECTOPIC PREGNANCY SURGERY      There were no vitals filed for this visit.  Subjective Assessment - 08/31/19 0942    Subjective  Most of her pain is in the low back.  Thinks a lot of it is from her drinking so much soda.  She states she is doing her HEP more consitently and feels some better but it doesn't last.    Patient Stated Goals  Stop hurting,    Currently in Pain?  Yes    Pain Score  6     Pain Location  Back    Pain Orientation  Lower    Pain Descriptors / Indicators  Aching;Tightness    Pain Type  Chronic pain    Pain Onset  More than a month ago    Pain Frequency  Constant         [facility] PT Assessment - 08/31/19  0001      Assessment   Medical Diagnosis  low back pain without sciatica, cervicalgia    Referring Provider (PT)  Asencion Noble, MD    Onset Date/Surgical Date  04/24/07                   [facility] Adult PT Treatment/Exercise - 08/31/19 0001      Neck Exercises: Supine   Neck Retraction  20 reps    Other Supine Exercise  on half bolster, yellow band, 15 reps, overhead pull, horiztontal abduction       Lumbar Exercises: Aerobic   Nustep  5 minutes. L4      Lumbar Exercises: Standing   Row  Strengthening;Both;20 reps;Theraband    Theraband Level (Row)  Level 3 (Green)    Row Limitations  elbows bent and straight with focus on scapular retraction    Other Standing Lumbar Exercises  lumbar counter rotation with green band bilat 2x10      Lumbar Exercises: Supine   Other Supine Lumbar Exercises  on half  bolster, 15 reps SLR, clams with green band, bilat and single, isometric hip flexion      Lumbar Exercises: Sidelying   Other Sidelying Lumbar Exercises  20 reps open book each side with red band               PT Short Term Goals - 07/28/19 0954      PT SHORT TERM GOAL #1   Title  Pt will be independent in her HEP.    Baseline  reports doing HEP    Time  4    Period  Weeks    Status  Achieved      PT SHORT TERM GOAL #2   Title  Pt will be able to perform postural correction with sitting and standing.    Baseline  I am trying but it hurts    Time  4    Period  Weeks    Status  On-going    Target Date  05/04/19      PT SHORT TERM GOAL #3   Title  Pt will be able to pick up objects off the floor with pain less than 3/10 in her low back.    Baseline  avoids due to severe pain    Time  4    Period  Weeks    Status  Achieved        PT Long Term Goals - 07/28/19 5885      PT LONG TERM GOAL #1   Title  Patient will be able to stand longer than 15 mins    Baseline  Patient is able to stand for 15 mins max    Time  8    Period  Weeks    Status   On-going      PT LONG TERM GOAL #2   Title  Will be able to bend over more than 10 times with a 3/10 pain    Baseline  Can't bend over more than 4 times without pain    Status  On-going      PT LONG TERM GOAL #3   Title  Patient will be able to achieve 60 degrees of cervical rotation    Baseline  See flowsheet    Time  8    Period  Weeks    Status  On-going            Plan - 08/31/19 1003    Clinical Impression Statement  Ann Moran reports she can see a little improvement once starting to do her HEP every day.  The relief isn't lasting lone.  She was encouraged to continue with this as she should start to see longer results.    PT Frequency  2x / week    PT Duration  8 weeks    PT Treatment/Interventions  ADLs/Self Care Home Management;Cryotherapy;Electrical Stimulation;Moist Heat;Ultrasound;Functional mobility training;Stair training;Gait training;Therapeutic activities;Therapeutic exercise;Balance training;Neuromuscular re-education;Passive range of motion;Dry needling;Taping;Patient/family education;Manual techniques    PT Next Visit Plan  reassess and request extension from MCD - she has not used all visits however date will be up.    Consulted and Agree with Plan of Care  Patient       Patient will benefit from skilled therapeutic intervention in order to improve the following deficits and impairments:  Pain, Obesity, Decreased strength, Decreased range of motion, Postural dysfunction, Decreased mobility, Difficulty walking  Visit Diagnosis: Chronic midline low back pain without sciatica  Cervicalgia  Muscle weakness (generalized)  Postural kyphosis of thoracic region  Problem List Patient Active Problem List   Diagnosis Date Noted  . Dysphagia 05/20/2019  . Encounter for other preprocedural examination 05/20/2019  . PCOS (polycystic ovarian syndrome) 02/24/2019  . Neck pain 02/24/2019  . Morbid obesity with body mass index (BMI) of 45.0 to 49.9 in adult  [facility]) 02/24/2019  . Essential hypertension 02/24/2019  . Gastroesophageal reflux disease 02/24/2019  . Irregular menstrual cycle 01/14/2019  . Abnormal uterine bleeding 01/14/2019  . Chronic cough 01/14/2019    Jeral Pinch PT  08/31/2019, 10:26 AM  [facility] [address] [place], [place], [address] Phone: [phone]   Fax:  [phone]  Name: Ann Moran MRN: [id number] Date of Birth: 02/28/1986  PHYSICAL THERAPY DISCHARGE SUMMARY  Visits from Start of Care: 7  Current functional level related to goals / functional outcomes: See above . She had her  appointments canceled by clinic due to attendance. I see she will return for PT in near future   Remaining deficits: Unknown   Education / Equipment: HEP  Plan: Patient agrees to discharge.  Patient goals were not met. Patient is being discharged due to                                                     ?????Poor attendance Pearson Forster PT  10/08/19

## 2019-09-01 ENCOUNTER — Encounter: Payer: Medicaid Other | Admitting: Physical Therapy

## 2019-09-04 ENCOUNTER — Encounter: Payer: Medicaid Other | Admitting: Physical Therapy

## 2019-09-08 ENCOUNTER — Encounter: Payer: Medicaid Other | Admitting: Physical Therapy

## 2019-09-08 ENCOUNTER — Ambulatory Visit: Payer: Medicaid Other | Admitting: Physical Therapy

## 2019-09-10 ENCOUNTER — Encounter: Payer: Medicaid Other | Admitting: Physical Therapy

## 2019-09-14 ENCOUNTER — Encounter: Payer: Self-pay | Admitting: Family Medicine

## 2019-09-17 ENCOUNTER — Ambulatory Visit: Payer: Medicaid Other

## 2019-09-17 ENCOUNTER — Other Ambulatory Visit: Payer: Self-pay | Admitting: Internal Medicine

## 2019-09-17 MED ORDER — BENZONATATE 100 MG PO CAPS: 100.0000 mg | ORAL_CAPSULE | Freq: Two times a day (BID) | ORAL | 0 refills | Status: DC | PRN

## 2019-09-18 ENCOUNTER — Other Ambulatory Visit: Payer: Self-pay

## 2019-09-18 MED ORDER — IBUPROFEN 600 MG PO TABS: 600.0000 mg | ORAL_TABLET | Freq: Three times a day (TID) | ORAL | 0 refills | Status: DC | PRN

## 2019-09-24 ENCOUNTER — Other Ambulatory Visit: Payer: Self-pay

## 2019-09-24 ENCOUNTER — Ambulatory Visit: Payer: Medicaid Other | Attending: Family Medicine | Admitting: Physician Assistant

## 2019-09-24 ENCOUNTER — Telehealth: Payer: Self-pay

## 2019-09-24 ENCOUNTER — Encounter: Payer: Self-pay | Admitting: Family Medicine

## 2019-09-24 VITALS — BP 137/92 | HR 94 | Temp 97.7°F | Ht 69.0 in | Wt 332.0 lb

## 2019-09-24 DIAGNOSIS — G8929 Other chronic pain: Secondary | ICD-10-CM

## 2019-09-24 DIAGNOSIS — E119 Type 2 diabetes mellitus without complications: Secondary | ICD-10-CM | POA: Insufficient documentation

## 2019-09-24 DIAGNOSIS — G44319 Acute post-traumatic headache, not intractable: Secondary | ICD-10-CM

## 2019-09-24 DIAGNOSIS — R054 Cough syncope: Secondary | ICD-10-CM

## 2019-09-24 DIAGNOSIS — E669 Obesity, unspecified: Secondary | ICD-10-CM | POA: Insufficient documentation

## 2019-09-24 DIAGNOSIS — Z87440 Personal history of urinary (tract) infections: Secondary | ICD-10-CM | POA: Diagnosis not present

## 2019-09-24 DIAGNOSIS — K219 Gastro-esophageal reflux disease without esophagitis: Secondary | ICD-10-CM | POA: Insufficient documentation

## 2019-09-24 DIAGNOSIS — Z79899 Other long term (current) drug therapy: Secondary | ICD-10-CM | POA: Diagnosis not present

## 2019-09-24 DIAGNOSIS — Z6841 Body Mass Index (BMI) 40.0 and over, adult: Secondary | ICD-10-CM | POA: Diagnosis not present

## 2019-09-24 DIAGNOSIS — R7303 Prediabetes: Secondary | ICD-10-CM

## 2019-09-24 DIAGNOSIS — R55 Syncope and collapse: Secondary | ICD-10-CM

## 2019-09-24 DIAGNOSIS — R05 Cough: Secondary | ICD-10-CM

## 2019-09-24 DIAGNOSIS — Z8249 Family history of ischemic heart disease and other diseases of the circulatory system: Secondary | ICD-10-CM | POA: Diagnosis not present

## 2019-09-24 DIAGNOSIS — M545 Low back pain, unspecified: Secondary | ICD-10-CM

## 2019-09-24 MED ORDER — PANTOPRAZOLE SODIUM 40 MG PO TBEC: 40.0000 mg | DELAYED_RELEASE_TABLET | Freq: Two times a day (BID) | ORAL | 3 refills | Status: DC

## 2019-09-24 NOTE — Telephone Encounter (Signed)
Pt. Request referral for physical therapy for her back pain.

## 2019-09-24 NOTE — Telephone Encounter (Signed)
Referral submitted to P.T.

## 2019-09-24 NOTE — Progress Notes (Signed)
Ann Moran, is a 34 y.o. female  MPN:[id number]  MGQ:[id number]  DOB - 1985-12-29  Subjective:  Chief Complaint and HPI: Ann Moran is a 34 y.o. female here today bc of cough induced syncope.  She has had this occur 3 or 4 times.  It happened about 10 days ago.  It has happened other times in the past more than a year ago and she did not seek medical attention.  The reason she decided to get it checked out this time was bc when she passed out, she hit the back of her head and has been having a lingering HA since then.  She says the episodes have been the same every time she has had them.  She has been smoking when it occurs, feels choked, then starts coughing, experiences urinary incontinence and jumps up to get to the bathroom then passes out.  This last episode 10 days ago was witnessed by her daughter and her bf.  They say she stood up quickly with the coughing then passed out, fell, hit her head then immediately was conscious again.  No N/V/vision changes.  HA has been dull in nature.  She thinks her mom had an aneurysm in her head in her 30s.  Mom is still living.  No FH sudden cardiac events.  No CP.  No SOB.    ROS:   Constitutional:  No f/c, No night sweats, No unexplained weight loss. EENT:  No vision changes, No blurry vision, No hearing changes. No mouth, throat, or ear problems.  Respiratory: No cough, No SOB Cardiac: No CP, no palpitations GI:  No abd pain, No N/V/D. GU: No Urinary s/sx Musculoskeletal: No joint pain Neuro: + dull headache, no dizziness, no motor weakness.  Skin: No rash Endocrine:  No polydipsia. No polyuria.  Psych: Denies SI/HI  No problems updated.  ALLERGIES: No Known Allergies  PAST MEDICAL HISTORY: Past Medical History:  Diagnosis Date  . Allergy   . Anemia   . Anxiety   . Chlamydia   . Depression    hx of meds, none currently- doing ok  . Diabetes mellitus without complication (HCC)   . GERD (gastroesophageal reflux disease)   .  Infection due to trichomonas   . MRSA (methicillin resistant staph aureus) culture positive    Dec 2012  . Obesity   . Pregnancy induced hypertension   . Urinary tract infection     MEDICATIONS AT HOME: Prior to Admission medications   Medication Sig Start Date End Date Taking? Authorizing Provider  albuterol (VENTOLIN HFA) 108 (90 Base) MCG/ACT inhaler Inhale 1-2 puffs into the lungs every 6 (six) hours as needed for wheezing or shortness of breath. 12/25/18  Yes Bast, Traci A, NP  amLODipine (NORVASC) 10 MG tablet Take 1 tablet (10 mg total) by mouth daily. 02/24/19  Yes Storm Frisk, MD  benzonatate (TESSALON PERLES) 100 MG capsule Take 1 capsule (100 mg total) by mouth 2 (two) times daily as needed for cough. 09/17/19  Yes Marcine Matar, MD  Blood Pressure Monitoring (BLOOD PRESSURE MONITOR 3) DEVI 1 Units by Does not apply route 2 (two) times daily. Measure blood pressure twice a day and record results 04/13/19  Yes Storm Frisk, MD  cetirizine (ZYRTEC) 10 MG tablet Take 1 tablet (10 mg total) by mouth daily. 08/24/19  Yes Hoy Register, MD  cyclobenzaprine (FLEXERIL) 10 MG tablet Take 1 tablet (10 mg total) by mouth 3 (three) times daily as needed for muscle  spasms. 08/24/19  Yes [name], MD  famotidine (PEPCID) 40 MG tablet Take 1 tablet (40 mg total) by mouth at bedtime. 05/20/19  Yes Zehr, Laban Emperor, PA-C  ibuprofen (ADVIL) 600 MG tablet Take 1 tablet (600 mg total) by mouth every 8 (eight) hours as needed. 09/18/19  Yes Gildardo Pounds, NP  losartan (COZAAR) 100 MG tablet Take 1 tablet (100 mg total) by mouth daily. 04/28/19 04/27/20 Yes Elsie Stain, MD  olopatadine (PATANOL) 0.1 % ophthalmic solution Place 1 drop into both eyes 2 (two) times daily. 08/24/19  Yes [name], MD  pantoprazole (PROTONIX) 40 MG tablet Take 1 tablet (40 mg total) by mouth 2 (two) times daily before a meal. 04/28/19  Yes Elsie Stain, MD  spironolactone (ALDACTONE) 25 MG tablet Take  1 tablet (25 mg total) by mouth daily. 06/29/19  Yes [name], MD  metFORMIN (GLUCOPHAGE) 500 MG tablet Take 1 tablet (500 mg total) by mouth 2 (two) times daily with a meal. Patient not taking: Reported on 09/24/2019 02/24/19   Truett Mainland, DO  Norethindrone Acetate-Ethinyl Estrad-FE (LOESTRIN 24 FE) 1-20 MG-MCG(24) tablet Take 1 tablet by mouth daily. Patient not taking: Reported on 09/24/2019 06/29/19   [name], MD  fluticasone (FLONASE) 50 MCG/ACT nasal spray Place 2 sprays into both nostrils daily. Patient not taking: Reported on 04/30/2018 02/25/18 12/06/18  Muthersbaugh, Jarrett Soho, PA-C  medroxyPROGESTERone (PROVERA) 5 MG tablet Take 2 tablets (10 mg total) by mouth daily. Patient not taking: Reported on 07/20/2018 06/30/18 12/06/18  Veryl Speak, MD     Objective:  EXAM:   Vitals:   09/24/19 1111  BP: (!) 137/92  Pulse: 94  Temp: 97.7 F (36.5 C)  TempSrc: Temporal  SpO2: 93%  Weight: (!) 332 lb (150.6 kg)  Height: 5\' 9"  (1.753 m)    General appearance : A&OX3. NAD. Non-toxic-appearing HEENT: Atraumatic and Normocephalic.  PERRLA. EOM intact.  TM clear B. Mouth-MMM, post pharynx WNL w/o erythema, No PND.  No swelling of scalp Neck: supple, no JVD. No cervical lymphadenopathy. No thyromegaly Chest/Lungs:  Breathing-non-labored, Good air entry bilaterally, breath sounds normal without rales, rhonchi, or wheezing  CVS: S1 S2 regular, no murmurs, gallops, rubs  Extremities: Bilateral Lower Ext shows no edema, both legs are warm to touch with = pulse throughout Neurology:  CN II-XII grossly intact, Non focal.  Neg rhomberg.  Normal heel to shin and finger to nose Psych:  TP linear. J/I WNL. Normal speech. Appropriate eye contact and affect.  Skin:  No Rash  Data Review Lab Results  Component Value Date   HGBA1C 5.7 (H) 02/16/2019     Assessment & Plan   1. Acute post-traumatic headache, not intractable She is 10 days out from the injury.  Normal neuro exam - CT  Head Wo Contrast; Future - Comprehensive metabolic panel - TSH - CBC with Differential/Platelet  2. Cough syncope syndrome Likely not cardiac given history but will refer for work-up if appropriate due to comorbidities - Ambulatory referral to Cardiology - CBC with Differential/Platelet  3. Syncope, unspecified syncope type Call 911 if occurs again - Ambulatory referral to Cardiology - CBC with Differential/Platelet  4. FHx: aneurysm - Ambulatory referral to Cardiology  5. Prediabetes - Hemoglobin A1c I have had a lengthy discussion and provided education about insulin resistance and the intake of too much sugar/refined carbohydrates.  I have advised the patient to work at a goal of eliminating sugary drinks, candy, desserts, sweets, refined sugars, processed  foods, and white carbohydrates.  The patient expresses understanding.   Patient have been counseled extensively about nutrition and exercise  Return in about 1 month (around 10/24/2019) for PCP;  chronic conditions.  The patient was given clear instructions to go to ER or return to medical center if symptoms don't improve, worsen or new problems develop. The patient verbalized understanding. The patient was told to call to get lab results if they haven't heard anything in the next week.     Georgian Co, PA-C [facility] [place], [place] [phone]   09/24/2019, 11:36 AMPatient ID: Ann Moran, female   DOB: [birth year], 34 y.o.   MRN: [id number]

## 2019-09-24 NOTE — Patient Instructions (Signed)
Make sure you are drinking 80-100 ounces water daily.  Reduce your sugar intake.  Stop smoking.   Syncope Syncope is when you pass out (faint) for a short time. It is caused by a sudden decrease in blood flow to the brain. Signs that you may be about to pass out include:  Feeling dizzy or light-headed.  Feeling sick to your stomach (nauseous).  Seeing all white or all black.  Having cold, clammy skin. If you pass out, get help right away. Call your local emergency services (911 in the [place] Do not drive yourself to the hospital. Follow these instructions at home: Watch for any changes in your symptoms. Take these actions to stay safe and help with your symptoms: Lifestyle  Do not drive, use machinery, or play sports until your doctor says it is okay.  Do not drink alcohol.  Do not use any products that contain nicotine or tobacco, such as cigarettes and e-cigarettes. If you need help quitting, ask your doctor.  Drink enough fluid to keep your pee (urine) pale yellow. General instructions  Take over-the-counter and prescription medicines only as told by your doctor.  If you are taking blood pressure or heart medicine, sit up and stand up slowly. Spend a few minutes getting ready to sit and then stand. This can help you feel less dizzy.  Have someone stay with you until you feel stable.  If you start to feel like you might pass out, lie down right away and raise (elevate) your feet above the level of your heart. Breathe deeply and steadily. Wait until all of the symptoms are gone.  Keep all follow-up visits as told by your doctor. This is important. Get help right away if:  You have a very bad headache.  You pass out once or more than once.  You have pain in your chest, belly, or back.  You have a very fast or uneven heartbeat (palpitations).  It hurts to breathe.  You are bleeding from your mouth or your bottom (rectum).  You have black or tarry poop (stool).  You  have jerky movements that you cannot control (seizure).  You are confused.  You have trouble walking.  You are very weak.  You have vision problems. These symptoms may be an emergency. Do not wait to see if the symptoms will go away. Get medical help right away. Call your local emergency services (911 in the [place] Do not drive yourself to the hospital. Summary  Syncope is when you pass out (faint) for a short time. It is caused by a sudden decrease in blood flow to the brain.  Signs that you may be about to faint include feeling dizzy, light-headed, or sick to your stomach, seeing all white or all black, or having cold, clammy skin.  If you start to feel like you might pass out, lie down right away and raise (elevate) your feet above the level of your heart. Breathe deeply and steadily. Wait until all of the symptoms are gone. This information is not intended to replace advice given to you by your health care provider. Make sure you discuss any questions you have with your health care provider. Document Revised: 05/22/2017 Document Reviewed: 05/22/2017 Elsevier Patient Education  2020 [employer].

## 2019-09-25 LAB — CBC WITH DIFFERENTIAL/PLATELET
Basophils Absolute: 0.1 10*3/uL (ref 0.0–0.2)
Basos: 1 %
EOS (ABSOLUTE): 0.2 10*3/uL (ref 0.0–0.4)
Eos: 3 %
Hematocrit: 40.1 % (ref 34.0–46.6)
Hemoglobin: 13 g/dL (ref 11.1–15.9)
Immature Grans (Abs): 0 10*3/uL (ref 0.0–0.1)
Immature Granulocytes: 0 %
Lymphocytes Absolute: 3 10*3/uL (ref 0.7–3.1)
Lymphs: 37 %
MCH: 28.3 pg (ref 26.6–33.0)
MCHC: 32.4 g/dL (ref 31.5–35.7)
MCV: 87 fL (ref 79–97)
Monocytes Absolute: 0.5 10*3/uL (ref 0.1–0.9)
Monocytes: 6 %
Neutrophils Absolute: 4.3 10*3/uL (ref 1.4–7.0)
Neutrophils: 53 %
Platelets: 359 10*3/uL (ref 150–450)
RBC: 4.6 x10E6/uL (ref 3.77–5.28)
RDW: 13.9 % (ref 11.7–15.4)
WBC: 8.1 10*3/uL (ref 3.4–10.8)

## 2019-09-25 LAB — COMPREHENSIVE METABOLIC PANEL
ALT: 20 IU/L (ref 0–32)
AST: 12 IU/L (ref 0–40)
Albumin/Globulin Ratio: 1.6 (ref 1.2–2.2)
Albumin: 4.5 g/dL (ref 3.8–4.8)
Alkaline Phosphatase: 79 IU/L (ref 48–121)
BUN/Creatinine Ratio: 11 (ref 9–23)
BUN: 9 mg/dL (ref 6–20)
Bilirubin Total: 0.3 mg/dL (ref 0.0–1.2)
CO2: 23 mmol/L (ref 20–29)
Calcium: 9.7 mg/dL (ref 8.7–10.2)
Chloride: 100 mmol/L (ref 96–106)
Creatinine, Ser: 0.81 mg/dL (ref 0.57–1.00)
GFR calc Af Amer: 110 mL/min/{1.73_m2} (ref >59)
GFR calc non Af Amer: 96 mL/min/{1.73_m2} (ref >59)
Globulin, Total: 2.9 g/dL (ref 1.5–4.5)
Glucose: 87 mg/dL (ref 65–99)
Potassium: 4.1 mmol/L (ref 3.5–5.2)
Sodium: 139 mmol/L (ref 134–144)
Total Protein: 7.4 g/dL (ref 6.0–8.5)

## 2019-09-25 LAB — HEMOGLOBIN A1C
Est. average glucose Bld gHb Est-mCnc: 117 mg/dL
Hgb A1c MFr Bld: 5.7 % — ABNORMAL HIGH (ref 4.8–5.6)

## 2019-09-25 LAB — TSH: TSH: 1.93 u[IU]/mL (ref 0.450–4.500)

## 2019-09-28 ENCOUNTER — Encounter: Payer: Self-pay | Admitting: Family Medicine

## 2019-10-01 ENCOUNTER — Ambulatory Visit: Payer: Medicaid Other

## 2019-10-01 ENCOUNTER — Ambulatory Visit: Payer: Medicaid Other | Admitting: Cardiology

## 2019-10-01 ENCOUNTER — Ambulatory Visit: Payer: Medicaid Other | Attending: Internal Medicine

## 2019-10-01 DIAGNOSIS — Z23 Encounter for immunization: Secondary | ICD-10-CM

## 2019-10-01 NOTE — Progress Notes (Signed)
   Covid-19 Vaccination Clinic  Name:  Ann Moran    MRN: [id number] DOB: [birth year]  10/01/2019  [name] was observed post Covid-19 immunization for 15 minutes without incident. She was provided with Vaccine Information Sheet and instruction to access the V-Safe system.   [name] was instructed to call 911 with any severe reactions post vaccine: Marland Kitchen Difficulty breathing  . Swelling of face and throat  . A fast heartbeat  . A bad rash all over body  . Dizziness and weakness   Immunizations Administered    Name Date Dose VIS Date Route   Pfizer COVID-19 Vaccine 10/01/2019  8:19 AM 0.3 mL 06/17/2018 Intramuscular   Manufacturer: [employer], [employer]   Lot: [id number]   NDC: [address]-4

## 2019-10-02 ENCOUNTER — Ambulatory Visit (HOSPITAL_COMMUNITY): Payer: Medicaid Other

## 2019-10-12 ENCOUNTER — Ambulatory Visit: Payer: Medicaid Other | Attending: Internal Medicine | Admitting: Physical Therapy

## 2019-10-12 ENCOUNTER — Other Ambulatory Visit: Payer: Self-pay

## 2019-10-12 DIAGNOSIS — M545 Low back pain, unspecified: Secondary | ICD-10-CM

## 2019-10-12 DIAGNOSIS — M6281 Muscle weakness (generalized): Secondary | ICD-10-CM

## 2019-10-12 DIAGNOSIS — G8929 Other chronic pain: Secondary | ICD-10-CM | POA: Insufficient documentation

## 2019-10-12 DIAGNOSIS — M4004 Postural kyphosis, thoracic region: Secondary | ICD-10-CM | POA: Diagnosis present

## 2019-10-12 NOTE — Therapy (Signed)
[facility] [address] [place], [place], [address] Phone: [phone]   Fax:  [phone]  Physical Therapy Evaluation  Patient Details  Name: Ann Moran MRN: [id number] Date of Birth: 15-Oct-1985 Referring Provider (PT): Marcine Matar, MD   Encounter Date: 10/12/2019   PT End of Session - 10/12/19 1059    Visit Number 1    Number of Visits 4    Date for PT Re-Evaluation 11/23/19    Authorization Type MCD auth requested    PT Start Time 0954    PT Stop Time 1032    PT Time Calculation (min) 38 min    Activity Tolerance Patient tolerated treatment well    Behavior During Therapy [facility] for tasks assessed/performed           Past Medical History:  Diagnosis Date  . Allergy   . Anemia   . Anxiety   . Chlamydia   . Depression    hx of meds, none currently- doing ok  . Diabetes mellitus without complication (HCC)   . GERD (gastroesophageal reflux disease)   . Infection due to trichomonas   . MRSA (methicillin resistant staph aureus) culture positive    Dec 2012  . Obesity   . Pregnancy induced hypertension   . Urinary tract infection     Past Surgical History:  Procedure Laterality Date  . CHOLECYSTECTOMY    . ECTOPIC PREGNANCY SURGERY      There were no vitals filed for this visit.    Subjective Assessment - 10/12/19 1056    Subjective Pt reports low back pain. No huge difference since the last time she was here. Pt reports she has started feeling well enough to go back to work; however, with prolonged standing she felt needles in her back.    Pertinent History HTN and DM per pt report, Depression    Limitations Standing;Lifting;House hold activities    How long can you stand comfortably? 20 minutes    Patient Stated Goals Stop hurting,    Currently in Pain? Yes    Pain Score 7     Pain Location Back    Pain Orientation Lower    Pain Descriptors / Indicators Throbbing    Pain Type Chronic pain      Pain Onset More than a month ago    Pain Frequency Constant    Pain Relieving Factors Laying down    Effect of Pain on Daily Activities difficulty sleeping              [facility] PT Assessment - 10/12/19 0001      Assessment   Medical Diagnosis M54.5,G89.29 (ICD-10-CM) - Chronic bilateral low back pain without sciatica    Referring Provider (PT) Marcine Matar, MD    Prior Therapy Last seen 5/10 for back pain      Balance Screen   Has the patient fallen in the past 6 months Yes    How many times? 1   3 weeks ago; pt reports fainting   Has the patient had a decrease in activity level because of a fear of falling?  No    Is the patient reluctant to leave their home because of a fear of falling?  No      Home Environment   Living Environment Private residence    Living Arrangements Children   10 yr old daughter   Available Help at Discharge Family    Type of Home Apartment  Home Access Level entry    Home Layout One level      Prior Function   Level of Independence Independent    Vocation Unemployed      Observation/Other Assessments   Focus on Therapeutic Outcomes (FOTO)  n/a      Functional Tests   Functional tests Single leg stance      Squat   Comments Unable      Single Leg Stance   Comments R LE: 22 sec L LE: 10 sec       Sit to Stand   Comments 5x sit<>stand 15.06 sec      Posture/Postural Control   Posture/Postural Control Postural limitations    Postural Limitations Increased lumbar lordosis   bilat knee valgus     AROM   Lumbar Flexion WFL however with pain    Lumbar Extension WFL no pain    Lumbar - Right Side Bend WFL    Lumbar - Left Side Bend WFL    Lumbar - Right Rotation WFL    Lumbar - Left Rotation [facility]      Strength   Right Hip Extension 4/5    Right Hip External Rotation  4-/5    Right Hip Internal Rotation 4/5    Right Hip ABduction 5/5    Left Hip Extension 4-/5    Left Hip External Rotation 4/5    Left Hip Internal Rotation  4/5    Left Hip ABduction 5/5      Palpation   Spinal mobility Thoracolumbar stiffness    Palpation comment TTP: quadratus lumborum, lumbar paraspinals, piriformis and PSIS      FABER test   findings Negative      Prone Knee Bend Test   Findings Negative      Straight Leg Raise   Findings Negative      Sacral Compression   Findings Negative    Comments reports feels good                      Objective measurements completed on examination: See above findings.       [facility] PT Treatment/Exercise - 10/12/19 0001      Lumbar Exercises: Stretches   Passive Hamstring Stretch Right;Left;60 seconds    Other Lumbar Stretch Exercise Forward stretch with ball x 30 sec and then with R & L side bend x 30 sec      Knee/Hip Exercises: Standing   Hip Abduction Stengthening;Both;10 reps   blue tband   Hip Extension Stengthening;Both;10 reps   blue tband   Wall Squat 10 reps   cueing to decrease strain on knee                   PT Short Term Goals - 10/12/19 1139      PT SHORT TERM GOAL #1   Title Pt will be independent with stretching her quadratus lumborum    Time 3    Period Weeks    Status New    Target Date 11/02/19      PT SHORT TERM GOAL #2   Title Pt will increase hip extension strength to at least 4+/5    Baseline Hip extension 4-/5 bilat    Time 3    Period Weeks    Status New    Target Date 11/02/19      PT SHORT TERM GOAL #3   Title Pt will be independent with performing good lifting mechanics to pick up  objects from the floor    Baseline Unable to squat due to knee pain    Time 3    Period Weeks    Status New    Target Date 11/02/19             PT Long Term Goals - 10/12/19 1204      PT LONG TERM GOAL #1   Title LTG to be assessed on re-cert    Time 3    Period Weeks    Status New    Target Date 11/02/19                  Plan - 10/12/19 1135    Clinical Impression Statement Ann Moran is a 34 y/o F presenting  to OPPT due to complaints of chronic low back pain. Pt demonstrates pain with flexion and poor tolerance to standing, hip, core, and lumbar weakness, and lumbar stiffness affecting her ability to perform home and return to working. Pt would benefit from consistent PT to address these issues. Pt states she has been doing some stretches from her previous HEP.    Personal Factors and Comorbidities Comorbidity 3+    Comorbidities HTN, DM, obesity, depression    Examination-Activity Limitations Carry;Dressing;Lift;Squat;Sit;Sleep;Bend;Stand    Examination-Participation Restrictions Community Activity;Driving;Laundry;Cleaning    Stability/Clinical Decision Making Evolving/Moderate complexity    Clinical Decision Making Moderate    Rehab Potential Fair    PT Frequency 1x / week    PT Duration 6 weeks    PT Treatment/Interventions ADLs/Self Care Home Management;Cryotherapy;Electrical Stimulation;Moist Heat;Ultrasound;Functional mobility training;Stair training;Gait training;Therapeutic activities;Therapeutic exercise;Balance training;Neuromuscular re-education;Passive range of motion;Dry needling;Taping;Patient/family education;Manual techniques    PT Next Visit Plan Assess response to HEP; continue to stretch and provide manual for quadratus lumborum and lumbar paraspinals. Continue to progress strengthening. Consider planks    PT Home Exercise Plan Access Code [email or web address]    Consulted and Agree with Plan of Care Patient           Patient will benefit from skilled therapeutic intervention in order to improve the following deficits and impairments:  Pain, Obesity, Decreased strength, Decreased range of motion, Postural dysfunction, Decreased mobility, Difficulty walking  Visit Diagnosis: Chronic midline low back pain without sciatica  Muscle weakness (generalized)  Postural kyphosis of thoracic region     Problem List Patient Active Problem List   Diagnosis Date Noted  . Dysphagia 05/20/2019    . Encounter for other preprocedural examination 05/20/2019  . PCOS (polycystic ovarian syndrome) 02/24/2019  . Neck pain 02/24/2019  . Morbid obesity with body mass index (BMI) of 45.0 to 49.9 in adult [facility]) 02/24/2019  . Essential hypertension 02/24/2019  . Gastroesophageal reflux disease 02/24/2019  . Irregular menstrual cycle 01/14/2019  . Abnormal uterine bleeding 01/14/2019  . Chronic cough 01/14/2019    Cadie Sorci April Ma L Markice Torbert PT, DPT 10/12/2019, 12:05 PM  [facility] [address] [place], [place], [address] Phone: [phone]   Fax:  [phone]  Name: Ann Moran MRN: [id number] Date of Birth: Oct 28, 1985

## 2019-10-15 ENCOUNTER — Ambulatory Visit (HOSPITAL_COMMUNITY): Payer: Medicaid Other

## 2019-10-19 ENCOUNTER — Other Ambulatory Visit: Payer: Self-pay | Admitting: Family Medicine

## 2019-10-19 ENCOUNTER — Other Ambulatory Visit: Payer: Self-pay | Admitting: Physician Assistant

## 2019-10-19 ENCOUNTER — Encounter: Payer: Self-pay | Admitting: Family Medicine

## 2019-10-19 DIAGNOSIS — G44319 Acute post-traumatic headache, not intractable: Secondary | ICD-10-CM

## 2019-10-19 MED ORDER — CITALOPRAM HYDROBROMIDE 20 MG PO TABS
20.0000 mg | ORAL_TABLET | Freq: Every day | ORAL | 3 refills | Status: DC
Start: 2019-10-19 — End: 2021-09-14

## 2019-10-19 MED ORDER — HYDROXYZINE HCL 25 MG PO TABS
25.0000 mg | ORAL_TABLET | Freq: Three times a day (TID) | ORAL | 1 refills | Status: DC | PRN
Start: 2019-10-19 — End: 2020-09-12

## 2019-10-21 ENCOUNTER — Ambulatory Visit (HOSPITAL_COMMUNITY): Payer: Medicaid Other

## 2019-10-22 ENCOUNTER — Ambulatory Visit: Payer: Medicaid Other

## 2019-10-25 NOTE — Progress Notes (Deleted)
Cardiology Office Note:    Date:  10/25/2019   ID:  [name], DOB 1985-06-18, MRN [id number]  PCP:  Hoy Register, MD  Cardiologist:  No primary care provider on file.  Electrophysiologist:  None   Referring MD: Anders Simmonds, PA-C   No chief complaint on file. ***  History of Present Illness:    Ann Moran is a 34 y.o. female with a hx of prediabetes, hypertension, obesity, anxiety/depression who is referred by Georgian Co, PA for evaluation of syncope and family history of aneurysms.  She reports that she has had 3-4 episodes of cough induced syncope.  Family history includes mother had cerebral aneurysm in 30s.  Past Medical History:  Diagnosis Date   Allergy    Anemia    Anxiety    Chlamydia    Depression    hx of meds, none currently- doing ok   Diabetes mellitus without complication (HCC)    GERD (gastroesophageal reflux disease)    Infection due to trichomonas    MRSA (methicillin resistant staph aureus) culture positive    Dec 2012   Obesity    Pregnancy induced hypertension    Urinary tract infection     Past Surgical History:  Procedure Laterality Date   CHOLECYSTECTOMY     ECTOPIC PREGNANCY SURGERY      Current Medications: No outpatient medications have been marked as taking for the 10/27/19 encounter (Appointment) with Little Ishikawa, MD.     Allergies:   Patient has no known allergies.   Social History   Socioeconomic History   Marital status: Single    Spouse name: Not on file   Number of children: Not on file   Years of education: Not on file   Highest education level: Not on file  Occupational History   Not on file  Tobacco Use   Smoking status: Current Every Day Smoker    Packs/day: 0.50    Years: 9.00    Pack years: 4.50    Types: Cigarettes   Smokeless tobacco: Never Used  Vaping Use   Vaping Use: Former   Quit date: 08/18/2015  Substance and Sexual Activity   Alcohol use: Yes      Comment: occasional   Drug use: No   Sexual activity: Yes    Birth control/protection: None  Other Topics Concern   Not on file  Social History Narrative   Not on file   Social Determinants of Health   Financial Resource Strain:    Difficulty of Paying Living Expenses:   Food Insecurity:    Worried About Programme researcher, broadcasting/film/video in the Last Year:    Barista in the Last Year:   Transportation Needs:    Freight forwarder (Medical):    Lack of Transportation (Non-Medical):   Physical Activity:    Days of Exercise per Week:    Minutes of Exercise per Session:   Stress:    Feeling of Stress :   Social Connections:    Frequency of Communication with Friends and Family:    Frequency of Social Gatherings with Friends and Family:    Attends Religious Services:    Active Member of Clubs or Organizations:    Attends Engineer, structural:    Marital Status:      Family History: The patient's ***family history includes Asthma in her father; Colon cancer in her paternal aunt and paternal grandmother; Diabetes in her mother and paternal grandmother; Hypertension in  her father, mother, and paternal grandmother; Stroke in her maternal grandmother. There is no history of Esophageal cancer, Stomach cancer, or Rectal cancer.  ROS:   Please see the history of present illness.    *** All other systems reviewed and are negative.  EKGs/Labs/Other Studies Reviewed:    The following studies were reviewed today: ***  EKG:  EKG is *** ordered today.  The ekg ordered today demonstrates ***  Recent Labs: 09/24/2019: ALT 20; BUN 9; Creatinine, Ser 0.81; Hemoglobin 13.0; Platelets 359; Potassium 4.1; Sodium 139; TSH 1.930  Recent Lipid Panel No results found for: CHOL, TRIG, HDL, CHOLHDL, VLDL, LDLCALC, LDLDIRECT  Physical Exam:    VS:  There were no vitals taken for this visit.    Wt Readings from Last 3 Encounters:  09/24/19 (!) 332 lb (150.6 kg)   06/12/19 (!) 326 lb (147.9 kg)  05/20/19 (!) 326 lb (147.9 kg)     GEN: *** Well nourished, well developed in no acute distress HEENT: Normal NECK: No JVD; No carotid bruits LYMPHATICS: No lymphadenopathy CARDIAC: ***RRR, no murmurs, rubs, gallops RESPIRATORY:  Clear to auscultation without rales, wheezing or rhonchi  ABDOMEN: Soft, non-tender, non-distended MUSCULOSKELETAL:  No edema; No deformity  SKIN: Warm and dry NEUROLOGIC:  Alert and oriented x 3 PSYCHIATRIC:  Normal affect   ASSESSMENT:    No diagnosis found. PLAN:    Syncope: Description suggestive of vasovagal syncope triggered by coughing.  Will check echocardiogram to rule out structural heart disease.  Family history of aneurysms: Mother had cerebral aneurysm.  Hypertension: On amlodipine 10 mg daily, losartan 100 mg daily, spironolactone 25 mg daily -Screen for OSA  Prediabetes: On Metformin  RTC in***  Medication Adjustments/Labs and Tests Ordered: Current medicines are reviewed at length with the patient today.  Concerns regarding medicines are outlined above.  No orders of the defined types were placed in this encounter.  No orders of the defined types were placed in this encounter.   There are no Patient Instructions on file for this visit.   Signed, Little Ishikawa, MD  10/25/2019 12:11 PM    [facility]

## 2019-10-27 ENCOUNTER — Ambulatory Visit: Payer: Medicaid Other | Admitting: Cardiology

## 2019-10-28 ENCOUNTER — Ambulatory Visit (HOSPITAL_COMMUNITY): Payer: Medicaid Other

## 2019-11-03 ENCOUNTER — Ambulatory Visit: Payer: Medicaid Other | Admitting: Cardiology

## 2019-11-03 ENCOUNTER — Encounter: Payer: Self-pay | Admitting: *Deleted

## 2019-11-03 ENCOUNTER — Encounter: Payer: Self-pay | Admitting: Cardiology

## 2019-11-03 ENCOUNTER — Other Ambulatory Visit: Payer: Self-pay

## 2019-11-03 VITALS — BP 132/87 | HR 105 | Ht 67.0 in | Wt 312.2 lb

## 2019-11-03 DIAGNOSIS — R0683 Snoring: Secondary | ICD-10-CM | POA: Diagnosis not present

## 2019-11-03 DIAGNOSIS — I10 Essential (primary) hypertension: Secondary | ICD-10-CM

## 2019-11-03 DIAGNOSIS — Z8249 Family history of ischemic heart disease and other diseases of the circulatory system: Secondary | ICD-10-CM | POA: Diagnosis not present

## 2019-11-03 DIAGNOSIS — R55 Syncope and collapse: Secondary | ICD-10-CM

## 2019-11-03 DIAGNOSIS — Z720 Tobacco use: Secondary | ICD-10-CM

## 2019-11-03 MED ORDER — AMLODIPINE BESYLATE 10 MG PO TABS: 10.0000 mg | ORAL_TABLET | Freq: Every day | ORAL | 3 refills | Status: DC

## 2019-11-03 NOTE — Progress Notes (Signed)
Patient ID: Ann Moran, female   DOB: [birth year], 34 y.o.   MRN: [id number] Patient enrolled for [employer] to ship a 14 day ZIO XT long term holter monitor to her home.

## 2019-11-03 NOTE — Patient Instructions (Addendum)
Medication Instructions:  RESTART amlodipine 10 mg daily  *If you need a refill on your cardiac medications before your next appointment, please call your pharmacy*  Testing/Procedures: MRA Head  Your physician has requested that you have an echocardiogram. Echocardiography is a painless test that uses sound waves to create images of your heart. It provides your doctor with information about the size and shape of your heart and how well your heart's chambers and valves are working. This procedure takes approximately one hour. There are no restrictions for this procedure. This will be done at our [facility] location:  [address]  Your physician has recommended that you have a sleep study. This test records several body functions during sleep, including: brain activity, eye movement, oxygen and carbon dioxide blood levels, heart rate and rhythm, breathing rate and rhythm, the flow of air through your mouth and nose, snoring, body muscle movements, and chest and belly movement. --this must be approved by insurance prior to scheduling.  ZIO XT- Long Term Monitor Instructions   Your physician has requested you wear your ZIO patch monitor 14 days.   This is a single patch monitor.  [employer] supplies one patch monitor per enrollment.  Additional stickers are not available.   Please do not apply patch if you will be having a Nuclear Stress Test, Echocardiogram, Cardiac CT, MRI, or Chest Xray during the time frame you would be wearing the monitor. The patch cannot be worn during these tests.  You cannot remove and re-apply the ZIO XT patch monitor.   Your ZIO patch monitor will be sent USPS Priority mail from [facility] directly to your home address. The monitor may also be mailed to a PO BOX if home delivery is not available.   It may take 3-5 days to receive your monitor after you have been enrolled.   Once you have received you monitor, please review enclosed  instructions.  Your monitor has already been registered assigning a specific monitor serial # to you.   Applying the monitor   Shave hair from upper left chest.   Hold abrader disc by orange tab.  Rub abrader in 40 strokes over left upper chest as indicated in your monitor instructions.   Clean area with 4 enclosed alcohol pads .  Use all pads to assure are is cleaned thoroughly.  Let dry.   Apply patch as indicated in monitor instructions.  Patch will be place under collarbone on left side of chest with arrow pointing upward.   Rub patch adhesive wings for 2 minutes.Remove white label marked "1".  Remove white label marked "2".  Rub patch adhesive wings for 2 additional minutes.   While looking in a mirror, press and release button in center of patch.  A small green light will flash 3-4 times .  This will be your only indicator the monitor has been turned on.     Do not shower for the first 24 hours.  You may shower after the first 24 hours.   Press button if you feel a symptom. You will hear a small click.  Record Date, Time and Symptom in the Patient Log Book.   When you are ready to remove patch, follow instructions on last 2 pages of Patient Log Book.  Stick patch monitor onto last page of Patient Log Book.   Place Patient Log Book in [place] box.  Use locking tab on box and tape box closed securely.  The [place] and  White box has [employer] on it.  Please place in mailbox as soon as possible.  Your physician should have your test results approximately 7 days after the monitor has been mailed back to [facility].   Call [facility] Customer Care at [phone] if you have questions regarding your ZIO XT patch monitor.  Call them immediately if you see an orange light blinking on your monitor.   If your monitor falls off in less than 4 days contact our Monitor department at [phone].  If your monitor becomes loose or falls off after 4 days call [employer] at [phone] for  suggestions on securing your monitor.    Follow-Up: At [facility], you and your health needs are our priority.  As part of our continuing mission to provide you with exceptional heart care, we have created designated Provider Care Teams.  These Care Teams include your primary Cardiologist (physician) and Advanced Practice Providers (APPs -  Physician Assistants and Nurse Practitioners) who all work together to provide you with the care you need, when you need it.  We recommend signing up for the patient portal called "MyChart".  Sign up information is provided on this After Visit Summary.  MyChart is used to connect with patients for Virtual Visits (Telemedicine).  Patients are able to view lab/test results, encounter notes, upcoming appointments, etc.  Non-urgent messages can be sent to your provider as well.   To learn more about what you can do with MyChart, go to [email or web address].    Your next appointment:   3 month(s)  The format for your next appointment:   In Person  Provider:   Epifanio Lesches, MD   Other Instructions Please check your blood pressure at home daily, write it down.  Call the office or send message via Mychart with the readings in 2 weeks for Dr. Bjorn Pippin to review.

## 2019-11-03 NOTE — Progress Notes (Signed)
Cardiology Office Note:    Date:  11/03/2019   ID:  Ann Moran, DOB [birth year], MRN [id number]  PCP:  Hoy Register, MD  Cardiologist:  No primary care provider on file.  Electrophysiologist:  None   Referring MD: Anders Simmonds, PA-C   Chief Complaint  Patient presents with  . Loss of Consciousness    History of Present Illness:    Ann Moran is a 34 y.o. female with a hx of prediabetes, hypertension, obesity, anxiety/depression, tobacco use who is referred by Georgian Co, PA for evaluation of syncope and family history of aneurysms.  She reports that she has had 4 episodes of cough induced syncope over last year.  Reports last episode happened 1 month ago.  She was sitting on her couch and began having a coughing spell and started to urinate.  She stood up to go to the bathroom and passed out.  Reports other 3 times where she passed out occurred while sitting after having coughing spell.  States she has frequent coughing spells that usually occur after she smokes.  She does not exercise, reports most exercise she does is walking around her house.  She denies any chest pain or dyspnea.  Occasionally feels like her heart is racing, typically occurs when she is upset.  She denies any lower extremity edema.  She denies any lightheadedness outside of her syncopal episodes.  Reports that she has been told that she snores and stops breathing while she sleeps.  She stopped taking all her BP medications 2 weeks ago.  Has not been checking BP at home.  Smokes 0.5 pack/day.  Father had MI in 50s.  Mother had cerebral aneurysm in 30s   Past Medical History:  Diagnosis Date  . Allergy   . Anemia   . Anxiety   . Chlamydia   . Depression    hx of meds, none currently- doing ok  . Diabetes mellitus without complication (HCC)   . GERD (gastroesophageal reflux disease)   . Infection due to trichomonas   . MRSA (methicillin resistant staph aureus) culture positive    Dec 2012  .  Obesity   . Pregnancy induced hypertension   . Urinary tract infection     Past Surgical History:  Procedure Laterality Date  . CHOLECYSTECTOMY    . ECTOPIC PREGNANCY SURGERY      Current Medications: No outpatient medications have been marked as taking for the 11/03/19 encounter (Office Visit) with Little Ishikawa, MD.     Allergies:   Patient has no known allergies.   Social History   Socioeconomic History  . Marital status: Single    Spouse name: Not on file  . Number of children: Not on file  . Years of education: Not on file  . Highest education level: Not on file  Occupational History  . Not on file  Tobacco Use  . Smoking status: Current Every Day Smoker    Packs/day: 0.50    Years: 9.00    Pack years: 4.50    Types: Cigarettes  . Smokeless tobacco: Never Used  Vaping Use  . Vaping Use: Former  . Quit date: 08/18/2015  Substance and Sexual Activity  . Alcohol use: Yes    Comment: occasional  . Drug use: No  . Sexual activity: Yes    Birth control/protection: None  Other Topics Concern  . Not on file  Social History Narrative  . Not on file   Social Determinants of Health  Financial Resource Strain:   . Difficulty of Paying Living Expenses:   Food Insecurity:   . Worried About Programme researcher, broadcasting/film/video in the Last Year:   . Barista in the Last Year:   Transportation Needs:   . Freight forwarder (Medical):   Marland Kitchen Lack of Transportation (Non-Medical):   Physical Activity:   . Days of Exercise per Week:   . Minutes of Exercise per Session:   Stress:   . Feeling of Stress :   Social Connections:   . Frequency of Communication with Friends and Family:   . Frequency of Social Gatherings with Friends and Family:   . Attends Religious Services:   . Active Member of Clubs or Organizations:   . Attends Banker Meetings:   Marland Kitchen Marital Status:      Family History: The patient's family history includes Asthma in her father; Colon  cancer in her paternal aunt and paternal grandmother; Diabetes in her mother and paternal grandmother; Hypertension in her father, mother, and paternal grandmother; Stroke in her maternal grandmother. There is no history of Esophageal cancer, Stomach cancer, or Rectal cancer.  ROS:   Please see the history of present illness.     All other systems reviewed and are negative.  EKGs/Labs/Other Studies Reviewed:    The following studies were reviewed today:   EKG:  EKG is ordered today.  The ekg ordered today demonstrates sinus tachycardia, rate 105, QTc 473 T wave inversion in lead III  Recent Labs: 09/24/2019: ALT 20; BUN 9; Creatinine, Ser 0.81; Hemoglobin 13.0; Platelets 359; Potassium 4.1; Sodium 139; TSH 1.930  Recent Lipid Panel No results found for: CHOL, TRIG, HDL, CHOLHDL, VLDL, LDLCALC, LDLDIRECT  Physical Exam:    VS:  BP 132/87   Pulse (!) 105   Ht 5\' 7"  (1.702 m)   Wt (!) 312 lb 3.2 oz (141.6 kg)   SpO2 92%   BMI 48.90 kg/m     Wt Readings from Last 3 Encounters:  11/03/19 (!) 312 lb 3.2 oz (141.6 kg)  09/24/19 (!) 332 lb (150.6 kg)  06/12/19 (!) 326 lb (147.9 kg)     GEN:   in no acute distress HEENT: Normal NECK: No JVD; No carotid bruits LYMPHATICS: No lymphadenopathy CARDIAC: regular, tachycardic, no murmurs, rubs, gallops RESPIRATORY:  Clear to auscultation without rales, wheezing or rhonchi  ABDOMEN: Soft, non-tender, non-distended MUSCULOSKELETAL:  No edema; No deformity  SKIN: Warm and dry NEUROLOGIC:  Alert and oriented x 3 PSYCHIATRIC:  Normal affect   ASSESSMENT:    1. Syncope and collapse   2. Snoring   3. Family history of cerebral aneurysm   4. Essential hypertension   5. Tobacco use    PLAN:    Syncope: Description suggestive of vasovagal syncope triggered by coughing.  Will check echocardiogram to rule out structural heart disease.  Check Zio patch x2 weeks to rule out arrhythmia.  Family history of aneurysms: Significant family  history of cerebral aneurysms, reports mother, aunt, and 2 cousins had cerebral aneurysms.  Recommend screening with MRA head.  Hypertension: Had been on amlodipine 10 mg daily, losartan 100 mg daily, spironolactone 25 mg daily.  Reports has been off all medications for past 2 weeks.  BP mildly elevated in clinic today, will restart amlodipine 10 mg daily.  Asked patient to monitor BP daily for next 2 weeks and call with results, will fold other antihypertensives back in if needed.  We will screen for OSA.  Suspected OSA: Reports she has been told she snores and stops breathing while sleeping.  Will order sleep study.  Prediabetes: On Metformin  Tobacco use: Patient counseled on risk of tobacco use and cessation strongly encouraged  RTC in 3 months  Medication Adjustments/Labs and Tests Ordered: Current medicines are reviewed at length with the patient today.  Concerns regarding medicines are outlined above.  Orders Placed This Encounter  Procedures  . MR ANGIO HEAD WO W CONTRAST  . LONG TERM MONITOR (3-14 DAYS)  . EKG 12-Lead  . ECHOCARDIOGRAM COMPLETE  . Split night study   Meds ordered this encounter  Medications  . amLODipine (NORVASC) 10 MG tablet    Sig: Take 1 tablet (10 mg total) by mouth daily.    Dispense:  90 tablet    Refill:  3    Patient Instructions  Medication Instructions:  RESTART amlodipine 10 mg daily  *If you need a refill on your cardiac medications before your next appointment, please call your pharmacy*  Testing/Procedures: MRA Head  Your physician has requested that you have an echocardiogram. Echocardiography is a painless test that uses sound waves to create images of your heart. It provides your doctor with information about the size and shape of your heart and how well your heart's chambers and valves are working. This procedure takes approximately one hour. There are no restrictions for this procedure. This will be done at our [facility]  location:  [address]  Your physician has recommended that you have a sleep study. This test records several body functions during sleep, including: brain activity, eye movement, oxygen and carbon dioxide blood levels, heart rate and rhythm, breathing rate and rhythm, the flow of air through your mouth and nose, snoring, body muscle movements, and chest and belly movement. --this must be approved by insurance prior to scheduling.  ZIO XT- Long Term Monitor Instructions   Your physician has requested you wear your ZIO patch monitor 14 days.   This is a single patch monitor.  [employer] supplies one patch monitor per enrollment.  Additional stickers are not available.   Please do not apply patch if you will be having a Nuclear Stress Test, Echocardiogram, Cardiac CT, MRI, or Chest Xray during the time frame you would be wearing the monitor. The patch cannot be worn during these tests.  You cannot remove and re-apply the ZIO XT patch monitor.   Your ZIO patch monitor will be sent USPS Priority mail from [facility] directly to your home address. The monitor may also be mailed to a PO BOX if home delivery is not available.   It may take 3-5 days to receive your monitor after you have been enrolled.   Once you have received you monitor, please review enclosed instructions.  Your monitor has already been registered assigning a specific monitor serial # to you.   Applying the monitor   Shave hair from upper left chest.   Hold abrader disc by orange tab.  Rub abrader in 40 strokes over left upper chest as indicated in your monitor instructions.   Clean area with 4 enclosed alcohol pads .  Use all pads to assure are is cleaned thoroughly.  Let dry.   Apply patch as indicated in monitor instructions.  Patch will be place under collarbone on left side of chest with arrow pointing upward.   Rub patch adhesive wings for 2 minutes.Remove white label marked "1".  Remove white  label marked "2".  Rub patch adhesive wings for 2 additional minutes.   While looking in a mirror, press and release button in center of patch.  A small green light will flash 3-4 times .  This will be your only indicator the monitor has been turned on.     Do not shower for the first 24 hours.  You may shower after the first 24 hours.   Press button if you feel a symptom. You will hear a small click.  Record Date, Time and Symptom in the Patient Log Book.   When you are ready to remove patch, follow instructions on last 2 pages of Patient Log Book.  Stick patch monitor onto last page of Patient Log Book.   Place Patient Log Book in [place] box.  Use locking tab on box and tape box closed securely.  The [place] and [employer] box has [employer] Cop[employer] postage on it.  Please place in mailbox as soon as possible.  Your physician should have your test results approximately 7 days after the monitor has [facility].   Call [facility] Customer Care at [phone] if you have questions regarding your ZIO XT patch monitor.  Call them immediately if you see an orange light blinking on your monitor.   If your monitor falls off in less than 4 days contact our Monitor department at [phone].  If your monitor becomes loose or falls off after 4 days call [employer] at [phone] for suggestions on securing your monitor.    Follow-Up: At [facility], you and your health needs are our priority.  As part of our continuing mission to provide you with exceptional heart care, we have created designated Provider Care Teams.  These Care Teams include your primary Cardiologist (physician) and Advanced Practice Providers (APPs -  Physician Assistants and Nurse Practitioners) who all work together to provide you with the care you need, when you need it.  We recommend signing up for the patient portal called "MyChart".  Sign up information is provided on this After Visit Summary.  MyChart is used to  connect with patients for Virtual Visits (Telemedicine).  Patients are able to view lab/test results, encounter notes, upcoming appointments, etc.  Non-urgent messages can be sent to your provider as well.   To learn more about what you can do with MyChart, go to [email or web address].    Your next appointment:   3 month(s)  The format for your next appointment:   In Person  Provider:   Epifanio Lescheshristopher Kwabena Strutz, MD   Other Instructions Please check your blood pressure at home daily, write it down.  Call the office or send message via Mychart with the readings in 2 weeks for Dr. Bjorn PippinSchumann to review.       Signed, Little Ishikawahristopher L Gennie Dib, MD  11/03/2019 1:31 PM    [facility]

## 2019-11-06 ENCOUNTER — Telehealth: Payer: Self-pay | Admitting: *Deleted

## 2019-11-07 ENCOUNTER — Encounter: Payer: Self-pay | Admitting: Family Medicine

## 2019-11-08 ENCOUNTER — Telehealth: Payer: Medicaid Other | Admitting: Physician Assistant

## 2019-11-08 DIAGNOSIS — R109 Unspecified abdominal pain: Secondary | ICD-10-CM

## 2019-11-08 NOTE — Progress Notes (Signed)
Based on what you shared with me, I feel your condition warrants further evaluation and I recommend that you be seen for a face to face office visit.   NOTE: If you entered your credit card information for this eVisit, you will not be charged. You may see a "hold" on your card for the $35 but that hold will drop off and you will not have a charge processed.   If you are having a true medical emergency please call 911.      For an urgent face to face visit, [facility] has five urgent care centers for your convenience:      NEW:  [facility] [place] Get Driving Directions [phone] [address] [place], [place] [address] . 10 am - 6pm Monday - Friday    [facility] ([place] Get Driving Directions [phone] [address] [place], [place] [address] . 10 am to 8 pm Monday-Friday . 12 pm to 8 pm Saturday-Sunday     [facility] Get Driving Directions [phone] [address], [address] [place], [place] [address] . 8 am to 8 pm Monday-Friday . 9 am to 6 pm Saturday . 11 am to 6 pm Sunday     [facility] [place] Get Driving Directions  [phone] [address] [place], [place] [address] . 8 am to 8 pm Monday-Friday . 8 am to 4 pm Saturday-Sunday    [facility] at [name] Get Driving Directions [phone] [address] [place], [place] [address] . 12 pm to 6 pm Monday-Friday      Your e-visit answers were reviewed by a board certified advanced clinical practitioner to complete your personal care plan.  Thank you for using e-Visits.   Greater than 5 minutes, yet less than 10 minutes of time have been spent researching, coordinating, and implementing care for this patient today 

## 2019-11-10 ENCOUNTER — Other Ambulatory Visit (HOSPITAL_COMMUNITY): Payer: Medicaid Other

## 2019-11-11 ENCOUNTER — Ambulatory Visit (HOSPITAL_COMMUNITY)
Admission: EM | Admit: 2019-11-11 | Discharge: 2019-11-11 | Disposition: A | Discharge status: Home / Self Care | Payer: Medicaid Other | Attending: Urgent Care | Admitting: Urgent Care

## 2019-11-11 ENCOUNTER — Encounter: Payer: Self-pay | Admitting: *Deleted

## 2019-11-11 ENCOUNTER — Ambulatory Visit (INDEPENDENT_AMBULATORY_CARE_PROVIDER_SITE_OTHER): Payer: Medicaid Other

## 2019-11-11 ENCOUNTER — Encounter (HOSPITAL_COMMUNITY): Payer: Self-pay | Admitting: Emergency Medicine

## 2019-11-11 ENCOUNTER — Other Ambulatory Visit: Payer: Self-pay

## 2019-11-11 ENCOUNTER — Other Ambulatory Visit: Payer: Self-pay | Admitting: Family Medicine

## 2019-11-11 ENCOUNTER — Encounter (HOSPITAL_COMMUNITY): Payer: Self-pay

## 2019-11-11 ENCOUNTER — Other Ambulatory Visit: Payer: Self-pay | Admitting: Nurse Practitioner

## 2019-11-11 ENCOUNTER — Emergency Department (HOSPITAL_COMMUNITY)
Admission: EM | Admit: 2019-11-11 | Discharge: 2019-11-11 | Disposition: A | Discharge status: Home / Self Care | Payer: Medicaid Other | Attending: Emergency Medicine | Admitting: Emergency Medicine

## 2019-11-11 DIAGNOSIS — R110 Nausea: Secondary | ICD-10-CM

## 2019-11-11 DIAGNOSIS — R350 Frequency of micturition: Secondary | ICD-10-CM | POA: Diagnosis present

## 2019-11-11 DIAGNOSIS — R112 Nausea with vomiting, unspecified: Secondary | ICD-10-CM

## 2019-11-11 DIAGNOSIS — R109 Unspecified abdominal pain: Secondary | ICD-10-CM | POA: Insufficient documentation

## 2019-11-11 DIAGNOSIS — Z5321 Procedure and treatment not carried out due to patient leaving prior to being seen by health care provider: Secondary | ICD-10-CM | POA: Insufficient documentation

## 2019-11-11 DIAGNOSIS — R1030 Lower abdominal pain, unspecified: Secondary | ICD-10-CM | POA: Diagnosis present

## 2019-11-11 DIAGNOSIS — R197 Diarrhea, unspecified: Secondary | ICD-10-CM | POA: Insufficient documentation

## 2019-11-11 DIAGNOSIS — R55 Syncope and collapse: Secondary | ICD-10-CM

## 2019-11-11 DIAGNOSIS — Z3202 Encounter for pregnancy test, result negative: Secondary | ICD-10-CM | POA: Diagnosis not present

## 2019-11-11 DIAGNOSIS — N3001 Acute cystitis with hematuria: Secondary | ICD-10-CM | POA: Diagnosis present

## 2019-11-11 LAB — POCT URINALYSIS DIP (DEVICE)
Bilirubin Urine: NEGATIVE
Glucose, UA: NEGATIVE mg/dL
Ketones, ur: NEGATIVE mg/dL
Nitrite: NEGATIVE
Protein, ur: 30 mg/dL — AB
Specific Gravity, Urine: 1.02 (ref 1.005–1.030)
Urobilinogen, UA: 0.2 mg/dL (ref 0.0–1.0)
pH: 7 (ref 5.0–8.0)

## 2019-11-11 LAB — CBC
HCT: 41.6 % (ref 36.0–46.0)
Hemoglobin: 13.2 g/dL (ref 12.0–15.0)
MCH: 29.4 pg (ref 26.0–34.0)
MCHC: 31.7 g/dL (ref 30.0–36.0)
MCV: 92.7 fL (ref 80.0–100.0)
Platelets: 289 10*3/uL (ref 150–400)
RBC: 4.49 MIL/uL (ref 3.87–5.11)
RDW: 14.1 % (ref 11.5–15.5)
WBC: 5.8 10*3/uL (ref 4.0–10.5)
nRBC: 0 % (ref 0.0–0.2)

## 2019-11-11 LAB — COMPREHENSIVE METABOLIC PANEL
ALT: 19 U/L (ref 0–44)
AST: 14 U/L — ABNORMAL LOW (ref 15–41)
Albumin: 4 g/dL (ref 3.5–5.0)
Alkaline Phosphatase: 57 U/L (ref 38–126)
Anion gap: 9 (ref 5–15)
BUN: <5 mg/dL — ABNORMAL LOW (ref 6–20)
CO2: 29 mmol/L (ref 22–32)
Calcium: 9 mg/dL (ref 8.9–10.3)
Chloride: 103 mmol/L (ref 98–111)
Creatinine, Ser: 0.74 mg/dL (ref 0.44–1.00)
GFR calc Af Amer: >60 mL/min (ref >60)
GFR calc non Af Amer: >60 mL/min (ref >60)
Glucose, Bld: 91 mg/dL (ref 70–99)
Potassium: 4.1 mmol/L (ref 3.5–5.1)
Sodium: 141 mmol/L (ref 135–145)
Total Bilirubin: 0.6 mg/dL (ref 0.3–1.2)
Total Protein: 7.2 g/dL (ref 6.5–8.1)

## 2019-11-11 LAB — URINALYSIS, ROUTINE W REFLEX MICROSCOPIC
Bilirubin Urine: NEGATIVE
Glucose, UA: NEGATIVE mg/dL
Ketones, ur: NEGATIVE mg/dL
Nitrite: NEGATIVE
Protein, ur: NEGATIVE mg/dL
Specific Gravity, Urine: 1.01 (ref 1.005–1.030)
pH: 7 (ref 5.0–8.0)

## 2019-11-11 LAB — LIPASE, BLOOD: Lipase: 17 U/L (ref 11–51)

## 2019-11-11 LAB — I-STAT BETA HCG BLOOD, ED (MC, WL, AP ONLY): I-stat hCG, quantitative: <5 m[IU]/mL (ref <5)

## 2019-11-11 LAB — POC URINE PREG, ED: Preg Test, Ur: NEGATIVE

## 2019-11-11 MED ORDER — NAPROXEN 500 MG PO TABS
500.0000 mg | ORAL_TABLET | Freq: Two times a day (BID) | ORAL | 0 refills | Status: DC
Start: 2019-11-11 — End: 2020-01-08

## 2019-11-11 MED ORDER — CEPHALEXIN 500 MG PO CAPS
500.0000 mg | ORAL_CAPSULE | Freq: Two times a day (BID) | ORAL | 0 refills | Status: DC
Start: 2019-11-11 — End: 2020-03-23

## 2019-11-11 MED ORDER — FLUCONAZOLE 150 MG PO TABS
150.0000 mg | ORAL_TABLET | ORAL | 0 refills | Status: DC
Start: 2019-11-11 — End: 2020-03-15

## 2019-11-11 NOTE — ED Triage Notes (Addendum)
Pt presents to UC for n/v/d since Thursday. Pt also complaining of abdominal pain and painful intercourse. Pt has been treating n/v/d with pepto, with no relief. Pt denies fevers, chills, body aches, headaches.   Of note pt left before being seen at [facility], had blood work and urine completed. No POC urine ordered by this RN

## 2019-11-11 NOTE — ED Triage Notes (Signed)
Per pt, states abdominal pain since thursday-was taking Pepto-states dark stool-states OTC meds not helping, no N/V-

## 2019-11-11 NOTE — Progress Notes (Signed)
Patient ID: Ann Moran, female   DOB: Sep 07, 1985, 34 y.o.   MRN: [id number] Patient had replacement 14 day ZIO XT applied in office.  Meredith from [facility] contacted to cancel original enrollment; monitor applied wrong.

## 2019-11-11 NOTE — ED Provider Notes (Addendum)
[facility]   MRN: [id number] DOB: 08-11-1985  Subjective:   Ann Moran is a 34 y.o. female presenting for 1 week history of persistent malaise. Symptoms started out with nausea, vomiting and diarrhea and lasted about 2 days. Symptoms resolved with just supportive care. She subsequently took an antibiotic for what she thought was an eye infection and then ended up stopping this. Her eyes no longer bothering her. Denies fever, active vomiting, bloody stools. She has now developed lower abdominal pain, cramping, urinary frequency. Admits history of UTI, BV. She is sexually active, does not use condoms for protection. Would like to be checked for STIs as well. Denies vaginal discharge, genital rash, genital rotation.  No current facility-administered medications for this encounter.  Current Outpatient Medications:  .  amLODipine (NORVASC) 10 MG tablet, Take 1 tablet (10 mg total) by mouth daily., Disp: 90 tablet, Rfl: 3 .  citalopram (CELEXA) 20 MG tablet, Take 1 tablet (20 mg total) by mouth daily., Disp: 30 tablet, Rfl: 3 .  cyclobenzaprine (FLEXERIL) 10 MG tablet, Take 1 tablet (10 mg total) by mouth 3 (three) times daily as needed for muscle spasms., Disp: 30 tablet, Rfl: 0 .  hydrOXYzine (ATARAX/VISTARIL) 25 MG tablet, Take 1 tablet (25 mg total) by mouth every 8 (eight) hours as needed., Disp: 60 tablet, Rfl: 1 .  albuterol (VENTOLIN HFA) 108 (90 Base) MCG/ACT inhaler, Inhale 1-2 puffs into the lungs every 6 (six) hours as needed for wheezing or shortness of breath., Disp: 18 g, Rfl: 1 .  ibuprofen (ADVIL) 600 MG tablet, Take 1 tablet (600 mg total) by mouth every 8 (eight) hours as needed., Disp: 30 tablet, Rfl: 0   No Known Allergies  Past Medical History:  Diagnosis Date  . Allergy   . Anemia   . Anxiety   . Chlamydia   . Depression    hx of meds, none currently- doing ok  . Diabetes mellitus without complication (HCC)   . GERD (gastroesophageal reflux disease)   .  Infection due to trichomonas   . MRSA (methicillin resistant staph aureus) culture positive    Dec 2012  . Obesity   . Pregnancy induced hypertension   . Urinary tract infection      Past Surgical History:  Procedure Laterality Date  . CHOLECYSTECTOMY    . ECTOPIC PREGNANCY SURGERY      Family History  Problem Relation Age of Onset  . Hypertension Mother   . Diabetes Mother   . Asthma Father   . Hypertension Father   . Stroke Maternal Grandmother   . Diabetes Paternal Grandmother   . Hypertension Paternal Grandmother   . Colon cancer Paternal Grandmother   . Colon cancer Paternal Aunt   . Esophageal cancer Neg Hx   . Stomach cancer Neg Hx   . Rectal cancer Neg Hx     Social History   Tobacco Use  . Smoking status: Current Every Day Smoker    Packs/day: 0.50    Years: 9.00    Pack years: 4.50    Types: Cigarettes  . Smokeless tobacco: Never Used  Vaping Use  . Vaping Use: Former  . Quit date: 08/18/2015  Substance Use Topics  . Alcohol use: Yes    Comment: occasional  . Drug use: No    ROS   Objective:   Vitals: BP 135/72 (BP Location: Right Arm)   Pulse 85   Temp 98.2 F (36.8 C) (Oral)   Resp 16  LMP 10/12/2019 (Approximate)   SpO2 100%   Physical Exam Constitutional:      General: She is not in acute distress.    Appearance: Normal appearance. She is well-developed and normal weight. She is not ill-appearing, toxic-appearing or diaphoretic.  HENT:     Head: Normocephalic and atraumatic.     Right Ear: External ear normal.     Left Ear: External ear normal.     Nose: Nose normal.     Mouth/Throat:     Mouth: Mucous membranes are moist.     Pharynx: Oropharynx is clear.  Eyes:     General: No scleral icterus.    Extraocular Movements: Extraocular movements intact.     Pupils: Pupils are equal, round, and reactive to light.  Cardiovascular:     Rate and Rhythm: Normal rate and regular rhythm.     Heart sounds: Normal heart sounds. No  murmur heard.  No friction rub. No gallop.   Pulmonary:     Effort: Pulmonary effort is normal. No respiratory distress.     Breath sounds: Normal breath sounds. No stridor. No wheezing, rhonchi or rales.  Abdominal:     General: Bowel sounds are normal. There is no distension.     Palpations: Abdomen is soft. There is no mass.     Tenderness: There is abdominal tenderness (lower abdomen). There is no right CVA tenderness, left CVA tenderness, guarding or rebound.  Skin:    General: Skin is warm and dry.     Coloration: Skin is not pale.     Findings: No rash.  Neurological:     General: No focal deficit present.     Mental Status: She is alert and oriented to person, place, and time.  Psychiatric:        Mood and Affect: Mood normal.        Behavior: Behavior normal.        Thought Content: Thought content normal.        Judgment: Judgment normal.     Results for orders placed or performed during the hospital encounter of 11/11/19 (from the past 24 hour(s))  Urinalysis, Routine w reflex microscopic     Status: Abnormal   Collection Time: 11/11/19  9:33 AM  Result Value Ref Range   Color, Urine YELLOW YELLOW   APPearance HAZY (A) CLEAR   Specific Gravity, Urine 1.010 1.005 - 1.030   pH 7.0 5.0 - 8.0   Glucose, UA NEGATIVE NEGATIVE mg/dL   Hgb urine dipstick LARGE (A) NEGATIVE   Bilirubin Urine NEGATIVE NEGATIVE   Ketones, ur NEGATIVE NEGATIVE mg/dL   Protein, ur NEGATIVE NEGATIVE mg/dL   Nitrite NEGATIVE NEGATIVE   Leukocytes,Ua MODERATE (A) NEGATIVE   RBC / HPF 0-5 0 - 5 RBC/hpf   WBC, UA 6-10 0 - 5 WBC/hpf   Bacteria, UA RARE (A) NONE SEEN   Squamous Epithelial / LPF 11-20 0 - 5   Mucus PRESENT   Lipase, blood     Status: None   Collection Time: 11/11/19 11:24 AM  Result Value Ref Range   Lipase 17 11 - 51 U/L  Comprehensive metabolic panel     Status: Abnormal   Collection Time: 11/11/19 11:24 AM  Result Value Ref Range   Sodium 141 135 - 145 mmol/L   Potassium  4.1 3.5 - 5.1 mmol/L   Chloride 103 98 - 111 mmol/L   CO2 29 22 - 32 mmol/L   Glucose, Bld 91 70 - 99 mg/dL  BUN <5 (L) 6 - 20 mg/dL   Creatinine, Ser 0.04 0.44 - 1.00 mg/dL   Calcium 9.0 8.9 - 59.9 mg/dL   Total Protein 7.2 6.5 - 8.1 g/dL   Albumin 4.0 3.5 - 5.0 g/dL   AST 14 (L) 15 - 41 U/L   ALT 19 0 - 44 U/L   Alkaline Phosphatase 57 38 - 126 U/L   Total Bilirubin 0.6 0.3 - 1.2 mg/dL   GFR calc non Af Amer >60 >60 mL/min   GFR calc Af Amer >60 >60 mL/min   Anion gap 9 5 - 15  CBC     Status: None   Collection Time: 11/11/19 11:24 AM  Result Value Ref Range   WBC 5.8 4.0 - 10.5 K/uL   RBC 4.49 3.87 - 5.11 MIL/uL   Hemoglobin 13.2 12.0 - 15.0 g/dL   HCT 77.4 36 - 46 %   MCV 92.7 80.0 - 100.0 fL   MCH 29.4 26.0 - 34.0 pg   MCHC 31.7 30.0 - 36.0 g/dL   RDW 14.2 39.5 - 32.0 %   Platelets 289 150 - 400 K/uL   nRBC 0.0 0.0 - 0.2 %  I-Stat beta hCG blood, ED     Status: None   Collection Time: 11/11/19 11:40 AM  Result Value Ref Range   I-stat hCG, quantitative <5.0 <5 mIU/mL   Comment 3           Results for orders placed or performed during the hospital encounter of 11/11/19 (from the past 24 hour(s))  POCT urinalysis dip (device)     Status: Abnormal   Collection Time: 11/11/19 12:28 PM  Result Value Ref Range   Glucose, UA NEGATIVE NEGATIVE mg/dL   Bilirubin Urine NEGATIVE NEGATIVE   Ketones, ur NEGATIVE NEGATIVE mg/dL   Specific Gravity, Urine 1.020 1.005 - 1.030   Hgb urine dipstick LARGE (A) NEGATIVE   pH 7.0 5.0 - 8.0   Protein, ur 30 (A) NEGATIVE mg/dL   Urobilinogen, UA 0.2 0.0 - 1.0 mg/dL   Nitrite NEGATIVE NEGATIVE   Leukocytes,Ua LARGE (A) NEGATIVE  POC urine pregnancy     Status: None   Collection Time: 11/11/19 12:34 PM  Result Value Ref Range   Preg Test, Ur NEGATIVE NEGATIVE      Assessment and Plan :   PDMP not reviewed this encounter.  1. Acute cystitis with hematuria   2. Nausea vomiting and diarrhea   3. Lower abdominal pain   4.  Urinary frequency     Patient was at [facility] long prior to coming to our clinic. She had labs drawn and all were unremarkable. Here in clinic, symptoms and physical exam findings consistent with acute cystitis with hematuria. Urine culture pending, start Keflex. Recommended aggressive hydration, naproxen for pain and inflammation. STI testing pending. Low suspicion for PID. Diflucan for antibiotic associated yeast infection. Counseled patient on potential for adverse effects with medications prescribed/recommended today, ER and return-to-clinic precautions discussed, patient verbalized understanding.   Wallis Bamberg, PA-C 11/11/19 1301

## 2019-11-12 ENCOUNTER — Telehealth (HOSPITAL_COMMUNITY): Payer: Self-pay | Admitting: Emergency Medicine

## 2019-11-12 LAB — CERVICOVAGINAL ANCILLARY ONLY
Bacterial Vaginitis (gardnerella): POSITIVE — AB
Candida Glabrata: NEGATIVE
Candida Vaginitis: NEGATIVE
Chlamydia: NEGATIVE
Comment: NEGATIVE
Comment: NEGATIVE
Comment: NEGATIVE
Comment: NEGATIVE
Comment: NEGATIVE
Comment: NORMAL
Neisseria Gonorrhea: NEGATIVE
Trichomonas: NEGATIVE

## 2019-11-12 LAB — URINE CULTURE: Culture: 70000 — AB

## 2019-11-12 MED ORDER — IBUPROFEN 600 MG PO TABS: 600.0000 mg | ORAL_TABLET | Freq: Three times a day (TID) | ORAL | 0 refills | Status: DC | PRN

## 2019-11-12 MED ORDER — CYCLOBENZAPRINE HCL 10 MG PO TABS: 10.0000 mg | ORAL_TABLET | Freq: Three times a day (TID) | ORAL | 0 refills | Status: DC | PRN

## 2019-11-12 MED ORDER — METRONIDAZOLE 500 MG PO TABS
500.0000 mg | ORAL_TABLET | Freq: Two times a day (BID) | ORAL | 0 refills | Status: DC
Start: 2019-11-12 — End: 2020-02-12

## 2019-11-12 NOTE — Telephone Encounter (Signed)
Bacterial vaginosis is positive. Pt needs treatment. Flagyl 500 mg BID x 7 days #14 no refills sent to patients pharmacy of choice.     Spoke with pt and verbalized understanding

## 2019-11-17 ENCOUNTER — Encounter: Payer: Self-pay | Admitting: Family Medicine

## 2019-11-24 ENCOUNTER — Ambulatory Visit (HOSPITAL_COMMUNITY): Payer: Medicaid Other

## 2019-11-24 ENCOUNTER — Ambulatory Visit: Payer: Medicaid Other | Admitting: Physical Therapy

## 2019-11-30 ENCOUNTER — Ambulatory Visit: Payer: Medicaid Other

## 2019-12-01 ENCOUNTER — Encounter (HOSPITAL_BASED_OUTPATIENT_CLINIC_OR_DEPARTMENT_OTHER): Payer: Self-pay

## 2019-12-01 ENCOUNTER — Ambulatory Visit (HOSPITAL_COMMUNITY): Admission: RE | Admit: 2019-12-01 | Payer: Medicaid Other | Source: Ambulatory Visit

## 2019-12-01 ENCOUNTER — Ambulatory Visit (HOSPITAL_BASED_OUTPATIENT_CLINIC_OR_DEPARTMENT_OTHER): Payer: Medicaid Other | Admitting: Cardiovascular Disease

## 2019-12-01 ENCOUNTER — Other Ambulatory Visit: Payer: Self-pay

## 2019-12-04 ENCOUNTER — Other Ambulatory Visit (HOSPITAL_COMMUNITY): Payer: Medicaid Other

## 2019-12-06 ENCOUNTER — Encounter: Payer: Self-pay | Admitting: Family Medicine

## 2019-12-07 ENCOUNTER — Encounter (HOSPITAL_COMMUNITY): Payer: Self-pay | Admitting: Cardiology

## 2019-12-07 ENCOUNTER — Other Ambulatory Visit: Payer: Self-pay | Admitting: Family Medicine

## 2019-12-07 ENCOUNTER — Telehealth: Payer: Self-pay | Admitting: Cardiology

## 2019-12-07 DIAGNOSIS — F329 Major depressive disorder, single episode, unspecified: Secondary | ICD-10-CM

## 2019-12-07 DIAGNOSIS — F419 Anxiety disorder, unspecified: Secondary | ICD-10-CM

## 2019-12-07 DIAGNOSIS — F32A Depression, unspecified: Secondary | ICD-10-CM

## 2019-12-07 NOTE — Telephone Encounter (Signed)
Spoke with patient regarding rescheduling MRA head ordered by Dr. Rosezella Rumpf Sunday 12/13/19 at 3:00pm at [facility]---arrival time is 2:30 pm---patient will need to go thru the ED and stop at the registration desk for check in.  Patient voiced her understanding.

## 2019-12-11 ENCOUNTER — Other Ambulatory Visit: Payer: Self-pay | Admitting: *Deleted

## 2019-12-11 DIAGNOSIS — I10 Essential (primary) hypertension: Secondary | ICD-10-CM

## 2019-12-11 DIAGNOSIS — Z8249 Family history of ischemic heart disease and other diseases of the circulatory system: Secondary | ICD-10-CM

## 2019-12-11 DIAGNOSIS — R55 Syncope and collapse: Secondary | ICD-10-CM

## 2019-12-13 ENCOUNTER — Other Ambulatory Visit (HOSPITAL_COMMUNITY): Payer: Medicaid Other

## 2019-12-13 ENCOUNTER — Ambulatory Visit (HOSPITAL_COMMUNITY): Payer: Medicaid Other

## 2019-12-16 ENCOUNTER — Encounter: Payer: Self-pay | Admitting: Family Medicine

## 2019-12-16 ENCOUNTER — Encounter (HOSPITAL_BASED_OUTPATIENT_CLINIC_OR_DEPARTMENT_OTHER): Payer: Self-pay

## 2019-12-16 ENCOUNTER — Other Ambulatory Visit: Payer: Self-pay | Admitting: Family Medicine

## 2019-12-16 ENCOUNTER — Encounter (HOSPITAL_BASED_OUTPATIENT_CLINIC_OR_DEPARTMENT_OTHER): Payer: Medicaid Other | Admitting: Cardiovascular Disease

## 2019-12-16 MED ORDER — CYCLOBENZAPRINE HCL 10 MG PO TABS: 10.0000 mg | ORAL_TABLET | Freq: Three times a day (TID) | ORAL | 0 refills | Status: DC | PRN

## 2019-12-17 ENCOUNTER — Telehealth (HOSPITAL_COMMUNITY): Payer: Self-pay | Admitting: Cardiology

## 2019-12-17 ENCOUNTER — Ambulatory Visit: Payer: Medicaid Other | Attending: Family Medicine | Admitting: Licensed Clinical Social Worker

## 2019-12-17 ENCOUNTER — Other Ambulatory Visit: Payer: Self-pay

## 2019-12-17 DIAGNOSIS — F4323 Adjustment disorder with mixed anxiety and depressed mood: Secondary | ICD-10-CM | POA: Diagnosis not present

## 2019-12-17 NOTE — Telephone Encounter (Signed)
Just an FYI. We have made several attempts to contact this patient including sending a letter to schedule or reschedule their echocardiogram. We will be removing the patient from the echo WQ.   12/07/2019 MAILED LETTER [facility]  12/04/2019 PT NO SHOWED x 2  11/10/19 Pt No SHOWED      Thank you

## 2019-12-17 NOTE — BH Specialist Note (Signed)
Integrated Behavioral Health Visit via Telemedicine (Telephone)  12/17/2019 Ann Moran [id number]   Session Start time: 2:35 PM  Session End time: 3:00 PM Total time: 25 minutes  Referring Provider: Dr Alvis Lemmings Type of Visit: Telephonic Patient location: Home [facility] Provider location: Office All persons participating in visit: LCSW and Patient   Discussed confidentiality: Yes   "By engaging in this telephone visit, you consent to the provision of healthcare.  Additionally, you authorize for your insurance to be billed for the services provided during this telephone visit."   Patient and/or legal guardian consented to telephone visit: Yes   PRESENTING CONCERNS: Patient and/or family reports the following symptoms/concerns: Pt reports difficulty managing depression and anxiety symptoms triggered by recent break-up with fiance. Symptoms include feelings of sadness and crying, pt does not endorse SI/HI.   LCSW inquired on medication management. Pt shared that she experienced "weird" dreams that caused itching in sleep. Pt stopped taking medication after approximately 4 weeks. Doing better with sleep since meds  Duration of problem: Ongoing; Diagnosed in 2012 with MDD and PTSD through [facility], participated in med management and therapy. Discontinued services due to transportation barriers Severity of problem: moderate  STRENGTHS (Protective Factors/Coping Skills): Pt has social connections  GOALS ADDRESSED: Patient will: 1.  Reduce symptoms of: anxiety and depression  2.  Increase knowledge and/or ability of: coping skills and healthy habits  3.  Demonstrate ability to: Increase healthy adjustment to current life circumstances and Increase adequate support systems for patient/family   PROGRESS OF GOALS: Ongoing  INTERVENTIONS: Interventions utilized:  Solution-Focused Strategies, Supportive Counseling, Psychoeducation and/or Health Education and Link to [employer] Standardized Assessments completed: Not Needed  ASSESSMENT: Patient currently experiencing difficulty managing depression and anxiety symptoms triggered by break-up with fiance. Denies current SI/HI.   Patient may benefit from therapy and medication management. Healthy coping skills were discussed to assist with management of symptoms. Pt is open to referral to [facility]. LCSW will send crisis intervention resources to address on file, per pt request.   PLAN: 1. Follow up with behavioral health clinician on : Contact LCSW with any additional behavioral health and/or resource needs 2. Behavioral recommendations: Pt agreed to utilize healthy coping skills discussed and crisis intervention resources, as needed 3. Referral(s): [facility] (LME/Outside Clinic)  Bridgett Larsson, [place] 01/05/2020 6:21 AM   Confirmed patient's address: Yes  Confirmed patient's phone number: Yes  Any changes to demographics: No   Confirmed patient's insurance: Yes  Any changes to patient's insurance: No    The following statements were read to the patient and/or legal guardian that are established with the [facility] Provider.  "The purpose of this phone visit is to provide behavioral health care while limiting exposure to the coronavirus (COVID19).  There is a possibility of technology failure and discussed alternative modes of communication if that failure occurs."

## 2019-12-18 ENCOUNTER — Ambulatory Visit (HOSPITAL_COMMUNITY)
Admission: EM | Admit: 2019-12-18 | Discharge: 2019-12-18 | Disposition: A | Discharge status: Home / Self Care | Payer: Medicaid Other | Attending: Family Medicine | Admitting: Family Medicine

## 2019-12-18 ENCOUNTER — Encounter (HOSPITAL_COMMUNITY): Payer: Self-pay

## 2019-12-18 ENCOUNTER — Other Ambulatory Visit: Payer: Self-pay

## 2019-12-18 DIAGNOSIS — Z20822 Contact with and (suspected) exposure to covid-19: Secondary | ICD-10-CM | POA: Diagnosis not present

## 2019-12-18 DIAGNOSIS — R430 Anosmia: Secondary | ICD-10-CM

## 2019-12-18 DIAGNOSIS — Z87440 Personal history of urinary (tract) infections: Secondary | ICD-10-CM | POA: Diagnosis not present

## 2019-12-18 DIAGNOSIS — Z8759 Personal history of other complications of pregnancy, childbirth and the puerperium: Secondary | ICD-10-CM | POA: Insufficient documentation

## 2019-12-18 DIAGNOSIS — K219 Gastro-esophageal reflux disease without esophagitis: Secondary | ICD-10-CM | POA: Diagnosis not present

## 2019-12-18 DIAGNOSIS — Z79899 Other long term (current) drug therapy: Secondary | ICD-10-CM | POA: Diagnosis not present

## 2019-12-18 DIAGNOSIS — E669 Obesity, unspecified: Secondary | ICD-10-CM | POA: Diagnosis not present

## 2019-12-18 DIAGNOSIS — R5383 Other fatigue: Secondary | ICD-10-CM | POA: Insufficient documentation

## 2019-12-18 DIAGNOSIS — Z8249 Family history of ischemic heart disease and other diseases of the circulatory system: Secondary | ICD-10-CM | POA: Insufficient documentation

## 2019-12-18 DIAGNOSIS — Z791 Long term (current) use of non-steroidal anti-inflammatories (NSAID): Secondary | ICD-10-CM | POA: Insufficient documentation

## 2019-12-18 DIAGNOSIS — Z833 Family history of diabetes mellitus: Secondary | ICD-10-CM | POA: Diagnosis not present

## 2019-12-18 DIAGNOSIS — B349 Viral infection, unspecified: Secondary | ICD-10-CM | POA: Diagnosis present

## 2019-12-18 DIAGNOSIS — F329 Major depressive disorder, single episode, unspecified: Secondary | ICD-10-CM | POA: Diagnosis not present

## 2019-12-18 DIAGNOSIS — S161XXA Strain of muscle, fascia and tendon at neck level, initial encounter: Secondary | ICD-10-CM | POA: Insufficient documentation

## 2019-12-18 DIAGNOSIS — R432 Parageusia: Secondary | ICD-10-CM

## 2019-12-18 DIAGNOSIS — F419 Anxiety disorder, unspecified: Secondary | ICD-10-CM | POA: Insufficient documentation

## 2019-12-18 DIAGNOSIS — F17210 Nicotine dependence, cigarettes, uncomplicated: Secondary | ICD-10-CM | POA: Diagnosis not present

## 2019-12-18 DIAGNOSIS — Z9049 Acquired absence of other specified parts of digestive tract: Secondary | ICD-10-CM | POA: Diagnosis not present

## 2019-12-18 DIAGNOSIS — M542 Cervicalgia: Secondary | ICD-10-CM

## 2019-12-18 DIAGNOSIS — R438 Other disturbances of smell and taste: Secondary | ICD-10-CM | POA: Insufficient documentation

## 2019-12-18 DIAGNOSIS — E119 Type 2 diabetes mellitus without complications: Secondary | ICD-10-CM | POA: Diagnosis not present

## 2019-12-18 DIAGNOSIS — R059 Cough, unspecified: Secondary | ICD-10-CM

## 2019-12-18 DIAGNOSIS — R05 Cough: Secondary | ICD-10-CM | POA: Diagnosis not present

## 2019-12-18 MED ORDER — ALBUTEROL SULFATE HFA 108 (90 BASE) MCG/ACT IN AERS: 2.0000 | INHALATION_SPRAY | RESPIRATORY_TRACT | 0 refills | Status: DC | PRN

## 2019-12-18 MED ORDER — PREDNISONE 10 MG (21) PO TBPK
ORAL_TABLET | Freq: Every day | ORAL | 0 refills | Status: AC
Start: 2019-12-18 — End: 2019-12-24

## 2019-12-18 MED ORDER — MELOXICAM 15 MG PO TABS: 15.0000 mg | ORAL_TABLET | Freq: Every day | ORAL | 0 refills | Status: DC

## 2019-12-18 MED ORDER — METHOCARBAMOL 500 MG PO TABS
500.0000 mg | ORAL_TABLET | Freq: Two times a day (BID) | ORAL | 0 refills | Status: DC
Start: 2019-12-18 — End: 2019-12-25

## 2019-12-18 MED ORDER — TIZANIDINE HCL 4 MG PO TABS
4.0000 mg | ORAL_TABLET | Freq: Four times a day (QID) | ORAL | 0 refills | Status: DC | PRN
Start: 2019-12-18 — End: 2019-12-18
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
## 2019-12-18 NOTE — Discharge Instructions (Signed)
Your COVID test is pending.  You should self quarantine until the test result is back.    Take Tylenol as needed for fever or discomfort.  Rest and keep yourself hydrated.    Go to the emergency department if you develop acute worsening symptoms.    Take Robaxin as prescribed  Take meloxicam as prescribed, do not take ibuprofen or naproxen with this medication  Follow up with your primary care provider or an orthopedist if your pain is not improving.
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
## 2019-12-19 ENCOUNTER — Ambulatory Visit (HOSPITAL_COMMUNITY): Payer: Medicaid Other | Attending: Cardiology

## 2019-12-19 LAB — SARS CORONAVIRUS 2 (TAT 6-24 HRS): SARS Coronavirus 2: NEGATIVE

## 2019-12-24 ENCOUNTER — Encounter: Payer: Self-pay | Admitting: Family Medicine

## 2019-12-25 ENCOUNTER — Ambulatory Visit (HOSPITAL_COMMUNITY)
Admission: EM | Admit: 2019-12-25 | Discharge: 2019-12-25 | Disposition: A | Discharge status: Home / Self Care | Payer: Medicaid Other | Attending: Internal Medicine | Admitting: Internal Medicine

## 2019-12-25 ENCOUNTER — Encounter (HOSPITAL_COMMUNITY): Payer: Self-pay

## 2019-12-25 ENCOUNTER — Ambulatory Visit (INDEPENDENT_AMBULATORY_CARE_PROVIDER_SITE_OTHER): Payer: Medicaid Other

## 2019-12-25 ENCOUNTER — Other Ambulatory Visit: Payer: Self-pay

## 2019-12-25 DIAGNOSIS — M542 Cervicalgia: Secondary | ICD-10-CM | POA: Diagnosis not present

## 2019-12-25 DIAGNOSIS — J060 Acute laryngopharyngitis: Secondary | ICD-10-CM

## 2019-12-25 MED ORDER — IBUPROFEN 600 MG PO TABS
600.0000 mg | ORAL_TABLET | Freq: Four times a day (QID) | ORAL | 0 refills | Status: DC | PRN
Start: 2019-12-25 — End: 2020-03-23

## 2019-12-25 MED ORDER — METHOCARBAMOL 500 MG PO TABS
500.0000 mg | ORAL_TABLET | Freq: Two times a day (BID) | ORAL | 0 refills | Status: DC
Start: 2019-12-25 — End: 2020-02-04

## 2019-12-25 MED ORDER — BIOTIN 5 MG PO TABS: 10.0000 mg | ORAL_TABLET | Freq: Every day | ORAL | 0 refills | Status: AC

## 2019-12-25 MED ORDER — BENZONATATE 100 MG PO CAPS
100.0000 mg | ORAL_CAPSULE | Freq: Three times a day (TID) | ORAL | 0 refills | Status: DC
Start: 2019-12-25 — End: 2020-02-12

## 2019-12-25 NOTE — ED Triage Notes (Signed)
Pt presents with chest pain, shortness of breath on exertion x 2 days; heaviness of the tongue x 1 day. Pt states she felt throbbing chest pain last night. Pt reports she is taking muscle relaxer for  Arm pain, but her mouth feels dry.

## 2019-12-25 NOTE — Discharge Instructions (Signed)
Humidifier use night is recommended Antihypertensive agents
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
## 2020-01-02 ENCOUNTER — Ambulatory Visit (HOSPITAL_COMMUNITY): Payer: Medicaid Other | Attending: Cardiology

## 2020-01-08 ENCOUNTER — Ambulatory Visit (HOSPITAL_COMMUNITY)
Admission: EM | Admit: 2020-01-08 | Discharge: 2020-01-08 | Disposition: A | Discharge status: Home / Self Care | Payer: Medicaid Other | Attending: Emergency Medicine | Admitting: Emergency Medicine

## 2020-01-08 ENCOUNTER — Other Ambulatory Visit: Payer: Self-pay

## 2020-01-08 ENCOUNTER — Encounter (HOSPITAL_COMMUNITY): Payer: Self-pay

## 2020-01-08 DIAGNOSIS — Z20822 Contact with and (suspected) exposure to covid-19: Secondary | ICD-10-CM | POA: Insufficient documentation

## 2020-01-08 DIAGNOSIS — J069 Acute upper respiratory infection, unspecified: Secondary | ICD-10-CM

## 2020-01-08 DIAGNOSIS — S161XXD Strain of muscle, fascia and tendon at neck level, subsequent encounter: Secondary | ICD-10-CM

## 2020-01-08 LAB — SARS CORONAVIRUS 2 (TAT 6-24 HRS): SARS Coronavirus 2: NEGATIVE

## 2020-01-08 MED ORDER — CETIRIZINE HCL 10 MG PO TABS
10.0000 mg | ORAL_TABLET | Freq: Every day | ORAL | 0 refills | Status: DC
Start: 2020-01-08 — End: 2020-02-12

## 2020-01-08 MED ORDER — NAPROXEN 500 MG PO TABS
500.0000 mg | ORAL_TABLET | Freq: Two times a day (BID) | ORAL | 0 refills | Status: DC
Start: 2020-01-08 — End: 2020-02-12

## 2020-01-08 MED ORDER — CYCLOBENZAPRINE HCL 10 MG PO TABS: 10.0000 mg | ORAL_TABLET | Freq: Three times a day (TID) | ORAL | 0 refills | Status: DC | PRN

## 2020-01-08 MED ORDER — PREDNISONE 20 MG PO TABS
40.0000 mg | ORAL_TABLET | Freq: Every day | ORAL | 0 refills | Status: AC
Start: 2020-01-08 — End: 2020-01-13

## 2020-01-08 NOTE — Discharge Instructions (Addendum)
Push fluids to ensure adequate hydration and keep secretions thin.  Tylenol and/or ibuprofen as needed for pain or fevers.  Daily zyrtec.  Naproxen twice a day as needed for pain.  Flexeril as needed as a muscle relaxer. May cause drowsiness. Please do not take if driving or drinking alcohol.   5 days of prednisone to help with your breathing.  Self isolate until covid results are back and negative.  Will notify you by phone of any positive findings. Your negative results will be sent through your MyChart.      If symptoms worsen or do not improve in the next week to return to be seen or to follow up with your PCP.
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
## 2020-01-08 NOTE — ED Triage Notes (Signed)
Pt presents with cough and sore throat x 2 days. Pt states she is feeling her neck is still swelling, she was seen at this location for the same complaint 3 weeks ago approx. Pt denies sob, fever,

## 2020-01-16 ENCOUNTER — Ambulatory Visit (HOSPITAL_BASED_OUTPATIENT_CLINIC_OR_DEPARTMENT_OTHER): Payer: Medicaid Other | Admitting: Cardiovascular Disease

## 2020-01-18 ENCOUNTER — Ambulatory Visit: Payer: Medicaid Other | Admitting: Family Medicine

## 2020-01-19 ENCOUNTER — Ambulatory Visit (HOSPITAL_COMMUNITY): Admit: 2020-01-19 | Payer: Medicaid Other

## 2020-01-26 ENCOUNTER — Encounter: Payer: Self-pay | Admitting: Family Medicine

## 2020-01-26 ENCOUNTER — Ambulatory Visit: Payer: Medicaid Other | Admitting: Advanced Practice Midwife

## 2020-01-26 ENCOUNTER — Other Ambulatory Visit: Payer: Self-pay | Admitting: Family Medicine

## 2020-01-26 MED ORDER — AMOXICILLIN 500 MG PO CAPS
500.0000 mg | ORAL_CAPSULE | Freq: Three times a day (TID) | ORAL | 0 refills | Status: DC
Start: 2020-01-26 — End: 2020-02-12

## 2020-02-03 ENCOUNTER — Encounter: Payer: Self-pay | Admitting: Family Medicine

## 2020-02-04 ENCOUNTER — Encounter: Payer: Self-pay | Admitting: Family Medicine

## 2020-02-04 ENCOUNTER — Ambulatory Visit: Payer: Medicaid Other | Attending: Family Medicine | Admitting: Family Medicine

## 2020-02-04 ENCOUNTER — Other Ambulatory Visit: Payer: Self-pay

## 2020-02-04 ENCOUNTER — Other Ambulatory Visit: Payer: Self-pay | Admitting: Family Medicine

## 2020-02-04 ENCOUNTER — Ambulatory Visit: Payer: Medicaid Other | Admitting: Cardiology

## 2020-02-04 VITALS — BP 137/91 | HR 90 | Ht 69.0 in | Wt 307.8 lb

## 2020-02-04 DIAGNOSIS — R202 Paresthesia of skin: Secondary | ICD-10-CM | POA: Diagnosis not present

## 2020-02-04 DIAGNOSIS — Z22322 Carrier or suspected carrier of Methicillin resistant Staphylococcus aureus: Secondary | ICD-10-CM | POA: Diagnosis not present

## 2020-02-04 DIAGNOSIS — E669 Obesity, unspecified: Secondary | ICD-10-CM | POA: Insufficient documentation

## 2020-02-04 DIAGNOSIS — F17210 Nicotine dependence, cigarettes, uncomplicated: Secondary | ICD-10-CM | POA: Diagnosis not present

## 2020-02-04 DIAGNOSIS — M62830 Muscle spasm of back: Secondary | ICD-10-CM

## 2020-02-04 DIAGNOSIS — Z791 Long term (current) use of non-steroidal anti-inflammatories (NSAID): Secondary | ICD-10-CM | POA: Diagnosis not present

## 2020-02-04 DIAGNOSIS — F419 Anxiety disorder, unspecified: Secondary | ICD-10-CM | POA: Insufficient documentation

## 2020-02-04 DIAGNOSIS — K219 Gastro-esophageal reflux disease without esophagitis: Secondary | ICD-10-CM | POA: Diagnosis not present

## 2020-02-04 DIAGNOSIS — E119 Type 2 diabetes mellitus without complications: Secondary | ICD-10-CM | POA: Diagnosis not present

## 2020-02-04 DIAGNOSIS — R200 Anesthesia of skin: Secondary | ICD-10-CM | POA: Diagnosis not present

## 2020-02-04 DIAGNOSIS — M542 Cervicalgia: Secondary | ICD-10-CM

## 2020-02-04 DIAGNOSIS — Z79899 Other long term (current) drug therapy: Secondary | ICD-10-CM | POA: Insufficient documentation

## 2020-02-04 DIAGNOSIS — Z87440 Personal history of urinary (tract) infections: Secondary | ICD-10-CM | POA: Diagnosis not present

## 2020-02-04 DIAGNOSIS — F329 Major depressive disorder, single episode, unspecified: Secondary | ICD-10-CM | POA: Diagnosis not present

## 2020-02-04 DIAGNOSIS — M25511 Pain in right shoulder: Secondary | ICD-10-CM

## 2020-02-04 MED ORDER — TRAMADOL HCL 50 MG PO TABS: 50.0000 mg | ORAL_TABLET | Freq: Three times a day (TID) | ORAL | 0 refills | Status: AC | PRN

## 2020-02-04 MED ORDER — PREDNISONE 20 MG PO TABS: ORAL_TABLET | ORAL | 0 refills | Status: DC

## 2020-02-04 MED ORDER — CYCLOBENZAPRINE HCL 5 MG PO TABS: ORAL_TABLET | ORAL | 1 refills | Status: DC
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
## 2020-02-04 NOTE — Progress Notes (Signed)
Arm/neck

## 2020-02-12 ENCOUNTER — Ambulatory Visit
Admission: RE | Admit: 2020-02-12 | Discharge: 2020-02-12 | Disposition: A | Discharge status: Home / Self Care | Payer: Medicaid Other | Source: Ambulatory Visit | Attending: Family Medicine | Admitting: Family Medicine

## 2020-02-12 ENCOUNTER — Other Ambulatory Visit: Payer: Self-pay

## 2020-02-12 VITALS — BP 132/88 | HR 108 | Temp 98.0°F | Resp 19

## 2020-02-12 DIAGNOSIS — M79601 Pain in right arm: Secondary | ICD-10-CM

## 2020-02-12 NOTE — Discharge Instructions (Addendum)
Try ice 20 minutes x 5 times a day Follow up with Ortho for further workup.

## 2020-02-12 NOTE — ED Provider Notes (Signed)
[facility]    CSN: [id number] Arrival date & time: 02/12/20  1101      History   Chief Complaint Chief Complaint  Patient presents with  . Arm Pain    HPI Ann Moran is a 34 y.o. female  With extensive medical history as outlined below presenting for persistent right upper arm and base of C-spine neck pain.  Has been seen by her PCP approximately 3 times since August after sustaining an injury.  Please see those records, reviewed by me at time of visit.  Has undergone 3 rounds of prednisone, muscle relaxer, tramadol with temporary relief.  Denies worsening of condition, repeat traumatization.  No change in vision or hearing, dizziness, arm numbness or weakness, chest pain, difficulty breathing.  Denies rash.  Past Medical History:  Diagnosis Date  . Allergy   . Anemia   . Anxiety   . Chlamydia   . Depression    hx of meds, none currently- doing ok  . Diabetes mellitus without complication (HCC)   . GERD (gastroesophageal reflux disease)   . Infection due to trichomonas   . MRSA (methicillin resistant staph aureus) culture positive    Dec 2012  . Obesity   . Pregnancy induced hypertension   . Urinary tract infection     Patient Active Problem List   Diagnosis Date Noted  . Dysphagia 05/20/2019  . Encounter for other preprocedural examination 05/20/2019  . PCOS (polycystic ovarian syndrome) 02/24/2019  . Neck pain 02/24/2019  . Morbid obesity with body mass index (BMI) of 45.0 to 49.9 in adult [facility]) 02/24/2019  . Essential hypertension 02/24/2019  . Gastroesophageal reflux disease 02/24/2019  . Irregular menstrual cycle 01/14/2019  . Abnormal uterine bleeding 01/14/2019  . Chronic cough 01/14/2019    Past Surgical History:  Procedure Laterality Date  . CHOLECYSTECTOMY    . ECTOPIC PREGNANCY SURGERY      OB History    Gravida  2   Para  1   Term  0   Preterm  1   AB  1   Living  1     SAB      TAB      Ectopic  1    Multiple      Live Births  1            Home Medications    Prior to Admission medications   Medication Sig Start Date End Date Taking? Authorizing Provider  cyclobenzaprine (FLEXERIL) 5 MG tablet One pill in am and midday and up to 2 pills at bedtime as needed for muscle spasm 02/04/20  Yes Fulp, Cammie, MD  predniSONE (DELTASONE) 20 MG tablet 2 pills daily x 2 days, 1 pill daily x 2 days then 1/2 pill daily x 4 days; take after eating 02/04/20  Yes Fulp, Cammie, MD  albuterol (VENTOLIN HFA) 108 (90 Base) MCG/ACT inhaler Inhale 2 puffs into the lungs every 4 (four) hours as needed for wheezing or shortness of breath. 12/18/19   Moshe Cipro, NP  cephALEXin (KEFLEX) 500 MG capsule Take 1 capsule (500 mg total) by mouth 2 (two) times daily. Patient not taking: Reported on 02/04/2020 11/11/19   Wallis Bamberg, PA-C  citalopram (CELEXA) 20 MG tablet Take 1 tablet (20 mg total) by mouth daily. 10/19/19   Hoy Register, MD  fluconazole (DIFLUCAN) 150 MG tablet Take 1 tablet (150 mg total) by mouth once a week. Patient not taking: Reported on 02/04/2020 11/11/19   Wallis Bamberg,  PA-C  hydrOXYzine (ATARAX/VISTARIL) 25 MG tablet Take 1 tablet (25 mg total) by mouth every 8 (eight) hours as needed. 10/19/19   Hoy Register, MD  ibuprofen (ADVIL) 600 MG tablet Take 1 tablet (600 mg total) by mouth every 6 (six) hours as needed. 12/25/19   LampteyBritta Mccreedy, MD  meloxicam (MOBIC) 15 MG tablet Take 1 tablet (15 mg total) by mouth daily. Patient not taking: Reported on 02/04/2020 12/18/19   Moshe Cipro, NP  amLODipine (NORVASC) 10 MG tablet Take 1 tablet (10 mg total) by mouth daily. Patient not taking: Reported on 02/04/2020 11/03/19 02/12/20  Little Ishikawa, MD  cetirizine (ZYRTEC) 10 MG tablet Take 1 tablet (10 mg total) by mouth daily. Patient not taking: Reported on 02/04/2020 01/08/20 02/12/20  Georgetta Haber, NP  fluticasone (FLONASE) 50 MCG/ACT nasal spray Place 2 sprays into  both nostrils daily. Patient not taking: Reported on 04/30/2018 02/25/18 12/06/18  Muthersbaugh, Dahlia Client, PA-C  medroxyPROGESTERone (PROVERA) 5 MG tablet Take 2 tablets (10 mg total) by mouth daily. Patient not taking: Reported on 07/20/2018 06/30/18 12/06/18  Geoffery Lyons, MD    Family History Family History  Problem Relation Age of Onset  . Hypertension Mother   . Diabetes Mother   . Asthma Father   . Hypertension Father   . Stroke Maternal Grandmother   . Diabetes Paternal Grandmother   . Hypertension Paternal Grandmother   . Colon cancer Paternal Grandmother   . Colon cancer Paternal Aunt   . Esophageal cancer Neg Hx   . Stomach cancer Neg Hx   . Rectal cancer Neg Hx     Social History Social History   Tobacco Use  . Smoking status: Current Every Day Smoker    Packs/day: 0.50    Years: 9.00    Pack years: 4.50    Types: Cigarettes  . Smokeless tobacco: Never Used  Vaping Use  . Vaping Use: Former  . Quit date: 08/18/2015  Substance Use Topics  . Alcohol use: Yes    Comment: occasional  . Drug use: No     Allergies   Patient has no known allergies.   Review of Systems As per HPI   Physical Exam Triage Vital Signs ED Triage Vitals  Enc Vitals Group     BP      Pulse      Resp      Temp      Temp src      SpO2      Weight      Height      Head Circumference      Peak Flow      Pain Score      Pain Loc      Pain Edu?      Excl. in GC?    No data found.  Updated Vital Signs BP 132/88 (BP Location: Left Arm)   Pulse (!) 108   Temp 98 F (36.7 C) (Oral)   Resp 19   SpO2 94%   Visual Acuity Right Eye Distance:   Left Eye Distance:   Bilateral Distance:    Right Eye Near:   Left Eye Near:    Bilateral Near:     Physical Exam Constitutional:      General: She is not in acute distress. HENT:     Head: Normocephalic and atraumatic.  Eyes:     General: No scleral icterus.    Pupils: Pupils are equal, round, and reactive to light.  Cardiovascular:     Rate and Rhythm: Normal rate.  Pulmonary:     Effort: Pulmonary effort is normal.  Musculoskeletal:        General: Tenderness present. No swelling. Normal range of motion.     Comments: C6, 7 without bony deformity.  More so right-sided superior trapezius with right neck arch tenderness.  Neurovascularly intact bilaterally.  Skin:    Coloration: Skin is not jaundiced or pale.  Neurological:     Mental Status: She is alert and oriented to person, place, and time.      UC Treatments / Results  Labs (all labs ordered are listed, but only abnormal results are displayed) Labs Reviewed - No data to display  EKG   Radiology No results found.  Procedures Procedures (including critical care time)  Medications Ordered in UC Medications - No data to display  Initial Impression / Assessment and Plan / UC Course  I have reviewed the triage vital signs and the nursing notes.  Pertinent labs & imaging results that were available during my care of the patient were reviewed by me and considered in my medical decision making (see chart for details).     Acute on chronic MSK pain.  Will keep follow-up appointments with PCP, though provided contact information for orthopedics for further evaluation/imaging as needed.  Will defer repeat prednisone and patient states she has enough cyclobenzaprine.  Divided work note was patient does do heavy lifting.  Return precautions discussed, pt verbalized understanding and is agreeable to plan. Final Clinical Impressions(s) / UC Diagnoses   Final diagnoses:  Right arm pain     Discharge Instructions     Try ice 20 minutes x 5 times a day Follow up with Ortho for further workup.    ED Prescriptions    None     PDMP not reviewed this encounter.   Hall[place], [place], [place] 02/12/20 1145

## 2020-02-12 NOTE — ED Triage Notes (Signed)
Patient in with c/o right upper arm pain that started approx 2 weeks ago. States that pain is achy and throbbing but is getting worse.  Was taking prednisone and muscle relaxers with no relief  Also states that when she doesn't use the arm she feels numbness and tingling  Denies any recent trauma or injury to area

## 2020-02-22 ENCOUNTER — Ambulatory Visit (HOSPITAL_COMMUNITY): Admission: RE | Admit: 2020-02-22 | Payer: Medicaid Other | Source: Ambulatory Visit

## 2020-02-22 ENCOUNTER — Other Ambulatory Visit: Payer: Self-pay | Admitting: Family Medicine

## 2020-02-22 DIAGNOSIS — M542 Cervicalgia: Secondary | ICD-10-CM

## 2020-02-28 ENCOUNTER — Ambulatory Visit (HOSPITAL_BASED_OUTPATIENT_CLINIC_OR_DEPARTMENT_OTHER): Payer: Medicaid Other | Attending: Cardiology | Admitting: Cardiovascular Disease

## 2020-02-28 ENCOUNTER — Other Ambulatory Visit: Payer: Self-pay

## 2020-02-28 DIAGNOSIS — G4736 Sleep related hypoventilation in conditions classified elsewhere: Secondary | ICD-10-CM | POA: Insufficient documentation

## 2020-02-28 DIAGNOSIS — Z79899 Other long term (current) drug therapy: Secondary | ICD-10-CM | POA: Diagnosis not present

## 2020-02-28 DIAGNOSIS — G4733 Obstructive sleep apnea (adult) (pediatric): Secondary | ICD-10-CM | POA: Diagnosis not present

## 2020-02-28 DIAGNOSIS — R0683 Snoring: Secondary | ICD-10-CM | POA: Diagnosis present

## 2020-02-28 DIAGNOSIS — Z791 Long term (current) use of non-steroidal anti-inflammatories (NSAID): Secondary | ICD-10-CM | POA: Insufficient documentation

## 2020-02-28 DIAGNOSIS — I10 Essential (primary) hypertension: Secondary | ICD-10-CM | POA: Insufficient documentation

## 2020-02-28 DIAGNOSIS — Z7952 Long term (current) use of systemic steroids: Secondary | ICD-10-CM | POA: Insufficient documentation

## 2020-02-28 NOTE — Progress Notes (Deleted)
Cardiology Office Note:    Date:  02/28/2020   ID:  Ann Moran, DOB 11-Oct-1985, MRN [id number]  PCP:  Hoy Register, MD  Cardiologist:  No primary care provider on file.  Electrophysiologist:  None   Referring MD: Hoy Register, MD   No chief complaint on file.   History of Present Illness:    Ann Moran is a 34 y.o. female with a hx of prediabetes, hypertension, obesity, anxiety/depression, tobacco use who presents for follow-up.  She was referred by Georgian Co, PA for evaluation of syncope and family history of aneurysms, initially seen on 11/03/2019.  She reports that she has had 4 episodes of cough induced syncope over last year.  Reports last episode happened 1 month ago.  She was sitting on her couch and began having a coughing spell and started to urinate.  She stood up to go to the bathroom and passed out.  Reports other 3 times where she passed out occurred while sitting after having coughing spell.  States she has frequent coughing spells that usually occur after she smokes.  She does not exercise, reports most exercise she does is walking around her house.  She denies any chest pain or dyspnea.  Occasionally feels like her heart is racing, typically occurs when she is upset.  She denies any lower extremity edema.  She denies any lightheadedness outside of her syncopal episodes.  Reports that she has been told that she snores and stops breathing while she sleeps.  She stopped taking all her BP medications 2 weeks ago.  Has not been checking BP at home.  Smokes 0.5 pack/day.  Father had MI in 78s.  Mother had cerebral aneurysm in 30s.  Initial clinic visit in July 2021, echocardiogram was ordered but has not been done.  Zio patch x11 days showed no significant arrhythmias.  MRA head was ordered but has not been done.  Since last clinic visit,   Past Medical History:  Diagnosis Date  . Allergy   . Anemia   . Anxiety   . Chlamydia   . Depression    hx of meds,  none currently- doing ok  . Diabetes mellitus without complication (HCC)   . GERD (gastroesophageal reflux disease)   . Infection due to trichomonas   . MRSA (methicillin resistant staph aureus) culture positive    Dec 2012  . Obesity   . Pregnancy induced hypertension   . Urinary tract infection     Past Surgical History:  Procedure Laterality Date  . CHOLECYSTECTOMY    . ECTOPIC PREGNANCY SURGERY      Current Medications: No outpatient medications have been marked as taking for the 03/01/20 encounter (Appointment) with Little Ishikawa, MD.     Allergies:   Patient has no known allergies.   Social History   Socioeconomic History  . Marital status: Single    Spouse name: Not on file  . Number of children: Not on file  . Years of education: Not on file  . Highest education level: Not on file  Occupational History  . Not on file  Tobacco Use  . Smoking status: Current Every Day Smoker    Packs/day: 0.50    Years: 9.00    Pack years: 4.50    Types: Cigarettes  . Smokeless tobacco: Never Used  Vaping Use  . Vaping Use: Former  . Quit date: 08/18/2015  Substance and Sexual Activity  . Alcohol use: Yes    Comment: occasional  .  Drug use: No  . Sexual activity: Yes    Birth control/protection: None  Other Topics Concern  . Not on file  Social History Narrative  . Not on file   Social Determinants of Health   Financial Resource Strain:   . Difficulty of Paying Living Expenses: Not on file  Food Insecurity:   . Worried About Programme researcher, broadcasting/film/video in the Last Year: Not on file  . Ran Out of Food in the Last Year: Not on file  Transportation Needs:   . Lack of Transportation (Medical): Not on file  . Lack of Transportation (Non-Medical): Not on file  Physical Activity:   . Days of Exercise per Week: Not on file  . Minutes of Exercise per Session: Not on file  Stress:   . Feeling of Stress : Not on file  Social Connections:   . Frequency of Communication  with Friends and Family: Not on file  . Frequency of Social Gatherings with Friends and Family: Not on file  . Attends Religious Services: Not on file  . Active Member of Clubs or Organizations: Not on file  . Attends Banker Meetings: Not on file  . Marital Status: Not on file     Family History: The patient's family history includes Asthma in her father; Colon cancer in her paternal aunt and paternal grandmother; Diabetes in her mother and paternal grandmother; Hypertension in her father, mother, and paternal grandmother; Stroke in her maternal grandmother. There is no history of Esophageal cancer, Stomach cancer, or Rectal cancer.  ROS:   Please see the history of present illness.     All other systems reviewed and are negative.  EKGs/Labs/Other Studies Reviewed:    The following studies were reviewed today:   EKG:  EKG is ordered today.  The ekg ordered today demonstrates sinus tachycardia, rate 105, QTc 473 T wave inversion in lead III  Recent Labs: 09/24/2019: TSH 1.930 11/11/2019: ALT 19; BUN <5; Creatinine, Ser 0.74; Hemoglobin 13.2; Platelets 289; Potassium 4.1; Sodium 141  Recent Lipid Panel No results found for: CHOL, TRIG, HDL, CHOLHDL, VLDL, LDLCALC, LDLDIRECT  Physical Exam:    VS:  There were no vitals taken for this visit.    Wt Readings from Last 3 Encounters:  02/04/20 (!) 307 lb 12.8 oz (139.6 kg)  12/01/19 (!) 330 lb (149.7 kg)  11/03/19 (!) 312 lb 3.2 oz (141.6 kg)     GEN:   in no acute distress HEENT: Normal NECK: No JVD; No carotid bruits LYMPHATICS: No lymphadenopathy CARDIAC: regular, tachycardic, no murmurs, rubs, gallops RESPIRATORY:  Clear to auscultation without rales, wheezing or rhonchi  ABDOMEN: Soft, non-tender, non-distended MUSCULOSKELETAL:  No edema; No deformity  SKIN: Warm and dry NEUROLOGIC:  Alert and oriented x 3 PSYCHIATRIC:  Normal affect   ASSESSMENT:    No diagnosis found. PLAN:    Syncope: Description  suggestive of vasovagal syncope triggered by coughing.  Echocardiogram ordered but has not been done.  Zio patch showed no significant arrhythmias.  Family history of aneurysms: Significant family history of cerebral aneurysms, reports mother, aunt, and 2 cousins had cerebral aneurysms.  Recommend screening with MRA head; has been ordered but not done yet.  Hypertension: Had been on amlodipine 10 mg daily, losartan 100 mg daily, spironolactone 25 mg daily.  Reports has been off all medications for past 2 weeks.  BP mildly elevated in clinic today, will restart amlodipine 10 mg daily.  Asked patient to monitor BP daily for  next 2 weeks and call with results, will fold other antihypertensives back in if needed.  We will screen for OSA.  Suspected OSA: Reports she has been told she snores and stops breathing while sleeping.  Sleep study ordered but has not been done.  Prediabetes: On Metformin  Tobacco use: Patient counseled on risk of tobacco use and cessation strongly encouraged  RTC in 3 months  Medication Adjustments/Labs and Tests Ordered: Current medicines are reviewed at length with the patient today.  Concerns regarding medicines are outlined above.  No orders of the defined types were placed in this encounter.  No orders of the defined types were placed in this encounter.   There are no Patient Instructions on file for this visit.   Signed, Little Ishikawa, MD  02/28/2020 7:08 PM    [facility]

## 2020-03-01 ENCOUNTER — Ambulatory Visit: Payer: Medicaid Other | Admitting: Cardiology

## 2020-03-04 ENCOUNTER — Encounter: Payer: Self-pay | Admitting: Family Medicine

## 2020-03-06 ENCOUNTER — Encounter (HOSPITAL_BASED_OUTPATIENT_CLINIC_OR_DEPARTMENT_OTHER): Payer: Self-pay | Admitting: Cardiovascular Disease

## 2020-03-06 NOTE — Procedures (Signed)
Patient Name: Ann Moran, Ann Moran Date: 02/28/2020 Gender: Female D.O.B: 11/25/1985 Age (years): 34 Referring Provider: Oswaldo Milian Height (inches): 15 Interpreting Physician: Shelva Majestic MD, ABSM Weight (lbs): 300 RPSGT: Gwenyth Allegra BMI: 47 MRN: [id number] Neck Size: 17.00  CLINICAL INFORMATION Sleep Study Type: Split Night CPAP  Indication for sleep study: Hypertension  Epworth Sleepiness Score: 9  SLEEP STUDY TECHNIQUE As per the AASM Manual for the Scoring of Sleep and Associated Events v2.3 (April 2016) with a hypopnea requiring 4% desaturations.  The channels recorded and monitored were frontal, central and occipital EEG, electrooculogram (EOG), submentalis EMG (chin), nasal and oral airflow, thoracic and abdominal wall motion, anterior tibialis EMG, snore microphone, electrocardiogram, and pulse oximetry. Continuous positive airway pressure (CPAP) was initiated when the patient met split night criteria and was titrated according to treat sleep-disordered breathing.  MEDICATIONS albuterol (VENTOLIN HFA) 108 (90 Base) MCG/ACT inhaler cephALEXin (KEFLEX) 500 MG capsule citalopram (CELEXA) 20 MG tablet cyclobenzaprine (FLEXERIL) 5 MG tablet fluconazole (DIFLUCAN) 150 MG tablet hydrOXYzine (ATARAX/VISTARIL) 25 MG tablet ibuprofen (ADVIL) 600 MG tablet meloxicam (MOBIC) 15 MG tablet predniSONE (DELTASONE) 20 MG tablet Medications self-administered by patient taken the night of the study : IBUPROFEN, HYDROXYZINE  RESPIRATORY PARAMETERS Diagnostic Total AHI (/hr): 18.4 RDI (/hr): 20.3 OA Index (/hr): - CA Index (/hr): 0.0 REM AHI (/hr): 107.1 NREM AHI (/hr): 7.1 Supine AHI (/hr): 33.8 Non-supine AHI (/hr): 17.5 Min O2 Sat (%): 72.0 Mean O2 (%): 93.0 Time below 88% (min): 8.2   Titration Optimal Pressure (cm): 16 AHI at Optimal Pressure (/hr): 0.8 Min O2 at Optimal Pressure (%): 89.0 Supine % at Optimal (%): 55 Sleep % at Optimal (%): 88   SLEEP  ARCHITECTURE The recording time for the entire night was 394 minutes.  During a baseline period of 231.4 minutes, the patient slept for 124.0 minutes in REM and nonREM, yielding a sleep efficiency of 53.6%%. Sleep onset after lights out was 86.2 minutes with a REM latency of 64.0 minutes. The patient spent 16.5%% of the night in stage N1 sleep, 72.2%% in stage N2 sleep, 0.0%% in stage N3 and 11.3% in REM.  During the titration period of 161.6 minutes, the patient slept for 147.8 minutes in REM and nonREM, yielding a sleep efficiency of 91.5%%. Sleep onset after CPAP initiation was 1.3 minutes with a REM latency of 23.0 minutes. The patient spent 5.8%% of the night in stage N1 sleep, 56.4%% in stage N2 sleep, 0.0%% in stage N3 and 37.9% in REM.  CARDIAC DATA The 2 lead EKG demonstrated sinus rhythm. The mean heart rate was 100.0 beats per minute. Other EKG findings include: None.  LEG MOVEMENT DATA The total Periodic Limb Movements of Sleep (PLMS) were 0. The PLMS index was 0.0 .  IMPRESSIONS - Moderate obstructive sleep apnea occurred during the diagnostic portion of the study (AHI 18.4/h; RDI 20.3/h); however, events were very severe during REM sleep (AHI 107.1/h). CPAP was initiated at 7 cm and was titrated to 16 cm of water. At 16 cm AHI 0.8/h with O2 nadir 89 and snoring was present. - No significant central sleep apnea occurred during the diagnostic portion of the study (CAI = 0.0/hour). - Significant  oxygen desaturation during the diagnostic portion of the study to a nadir of 83% with NREM sleep and 72.0% during REM sleep. - The patient snored with soft snoring volume during the diagnostic portion of the study. - No cardiac abnormalities were noted during this study. - Clinically significant  periodic limb movements did not occur during sleep.  DIAGNOSIS - Obstructive Sleep Apnea (G47.33) - Nocturnal hypoxemia  RECOMMENDATIONS - Recommend an initial trial of CPAP Auto therapy with EPR  of 3 at 14 - 20 cm H2O with heated humidification.  A Medium size Fisher&Paykel Full Face Mask Simplus mask was used for the titration study. - Effort should be made to optimize nasal and oropharyngeal patency - Avoid alcohol, sedatives and other CNS depressants that may worsen sleep apnea and disrupt normal sleep architecture. - Sleep hygiene should be reviewed to assess factors that may improve sleep quality. - Weight management (BMI 47) and regular exercise should be initiated or continued. - Recommend a download in 30 days and sleep clinic evaluation after 4 weeks of therapy.  [Electronically signed] 03/06/2020 09:46 AM  Shelva Majestic MD, [facility], [place], American Board of Sleep Medicine   NPI: [id number] [place] PH: [phone]   FX: [phone] [place]

## 2020-03-07 ENCOUNTER — Telehealth: Payer: Self-pay | Admitting: *Deleted

## 2020-03-07 ENCOUNTER — Ambulatory Visit (HOSPITAL_COMMUNITY): Admission: RE | Admit: 2020-03-07 | Payer: Medicaid Other | Source: Ambulatory Visit

## 2020-03-07 NOTE — Telephone Encounter (Signed)
Informed patient of sleep study results and patient understanding was verbalized. Patient understands her sleep study showed   IMPRESSIONS - Moderate obstructive sleep apnea occurred during the diagnostic portion of the study (AHI 18.4/h; RDI 20.3/h); however, events were very severe during REM sleep (AHI 107.1/h). CPAP was initiated at 7 cm and was titrated to 16 cm of water. At 16 cm AHI 0.8/h with O2 nadir 89 and snoring was present. - Significant  oxygen desaturation during the diagnostic portion of the study to a nadir of 83% with NREM sleep and 72.0% during REM sleep. - The patient snored with soft snoring volume during the diagnostic portion of the study. - No cardiac abnormalities were noted during this study. - Clinically significant periodic limb movements did not occur during sleep.  DIAGNOSIS - Obstructive Sleep Apnea (G47.33) - Nocturnal hypoxemia  RECOMMENDATIONS - Recommend an initial trial of CPAP Auto therapy with EPR of 3 at 14 - 20 cm H2O with heated humidification  Upon patient request DME selection is [facility]. Patient understands she/he will be contacted by [facility] to set up her/he cpap. Patient understands to call if [facility] does not contact her/he with new setup in a timely manner. Patient understands they will be called once confirmation has been received from [facility] that they have received their new machine to schedule 10 week follow up appointment.   [facility] notified of new cpap order  Please add to airview Patient was grateful for the call and thanked me.

## 2020-03-07 NOTE — Telephone Encounter (Signed)
-----   Message from Lennette Bihari, MD sent at 03/06/2020  9:52 AM EST ----- [place] [place], please notify pt and set up with DME for CPAP initiation.

## 2020-03-11 ENCOUNTER — Ambulatory Visit: Payer: Medicaid Other | Admitting: Cardiology

## 2020-03-14 ENCOUNTER — Ambulatory Visit: Payer: Medicaid Other | Attending: Family Medicine

## 2020-03-14 ENCOUNTER — Other Ambulatory Visit: Payer: Self-pay

## 2020-03-14 ENCOUNTER — Encounter: Payer: Self-pay | Admitting: Certified Nurse Midwife

## 2020-03-14 ENCOUNTER — Ambulatory Visit (INDEPENDENT_AMBULATORY_CARE_PROVIDER_SITE_OTHER): Payer: Medicaid Other | Admitting: Certified Nurse Midwife

## 2020-03-14 ENCOUNTER — Other Ambulatory Visit (HOSPITAL_COMMUNITY)
Admission: RE | Admit: 2020-03-14 | Discharge: 2020-03-14 | Disposition: A | Discharge status: Home / Self Care | Payer: Medicaid Other | Source: Ambulatory Visit | Attending: Certified Nurse Midwife | Admitting: Certified Nurse Midwife

## 2020-03-14 VITALS — BP 129/83 | HR 97 | Ht 69.0 in | Wt 313.6 lb

## 2020-03-14 DIAGNOSIS — Z23 Encounter for immunization: Secondary | ICD-10-CM | POA: Diagnosis not present

## 2020-03-14 DIAGNOSIS — Z113 Encounter for screening for infections with a predominantly sexual mode of transmission: Secondary | ICD-10-CM | POA: Diagnosis present

## 2020-03-14 DIAGNOSIS — E282 Polycystic ovarian syndrome: Secondary | ICD-10-CM

## 2020-03-14 DIAGNOSIS — B373 Candidiasis of vulva and vagina: Secondary | ICD-10-CM

## 2020-03-14 DIAGNOSIS — J111 Influenza due to unidentified influenza virus with other respiratory manifestations: Secondary | ICD-10-CM

## 2020-03-14 DIAGNOSIS — A599 Trichomoniasis, unspecified: Secondary | ICD-10-CM | POA: Diagnosis not present

## 2020-03-14 DIAGNOSIS — B3731 Acute candidiasis of vulva and vagina: Secondary | ICD-10-CM

## 2020-03-14 DIAGNOSIS — Z01419 Encounter for gynecological examination (general) (routine) without abnormal findings: Secondary | ICD-10-CM | POA: Diagnosis not present

## 2020-03-14 MED ORDER — GLYBURIDE 5 MG PO TABS: 5.0000 mg | ORAL_TABLET | Freq: Every day | ORAL | 5 refills | Status: DC

## 2020-03-14 NOTE — Progress Notes (Signed)
History:  Ann Moran is a 34 y.o. [phone] who presents to clinic today for annual gynecology exam. Patient reports facial acne and notes she would like STD testing today. Patient notes she has been experiencing facial acne for about one year since she was diagnosed with PCOS. Patient states she has taken spironolactone for about one year without any improvement in her acne. She states it is progressively worsening. Patient notes she is not currently taking any medications for her PCOS. She was taking metformin in the past but discontinued it on her own because it was making her sick. Patient also notes that she is not using any birth control currently. In regards to STD testing, patient states her partner has not been faithful to her, so she would like to be tested. Patient is not experiencing any symptoms of STD. Patient denies any N/V/D, abdominal pain, vaginal bleeding, vaginal discharge, vaginal pain, dysuria, shortness of breath or chest pain today.  The following portions of the patient's history were reviewed and updated as appropriate: allergies, current medications, family history, past medical history, social history, past surgical history and problem list.  Review of Systems:  Review of Systems  Respiratory: Negative for shortness of breath.   Cardiovascular: Negative for chest pain.  Gastrointestinal: Negative for abdominal pain, diarrhea, nausea and vomiting.  Genitourinary: Negative for dysuria.       Negative for vaginal pain, bleeding, or discharge.  Skin: Negative for rash.       Facial acne extending to neck and down back  Neurological: Negative for dizziness and headaches.      Objective:  Physical Exam BP 129/83   Pulse 97   Ht 5\' 9"  (1.753 m)   Wt (!) 313 lb 9.6 oz (142.2 kg)   LMP 02/29/2020 (Approximate)   BMI 46.31 kg/m  Physical Exam Constitutional:      Appearance: She is obese.  HENT:     Head: Normocephalic and atraumatic.  Cardiovascular:      Rate and Rhythm: Normal rate and regular rhythm.  Pulmonary:     Effort: Pulmonary effort is normal.     Breath sounds: Normal breath sounds.  Abdominal:     General: Abdomen is flat. There is no distension.     Palpations: Abdomen is soft. There is no mass.     Tenderness: There is no abdominal tenderness. There is no guarding.  Musculoskeletal:        General: No swelling.     Cervical back: Normal range of motion and neck supple.     Right lower leg: No edema.     Left lower leg: No edema.  Skin:    General: Skin is warm and dry.  Neurological:     General: No focal deficit present.     Mental Status: She is alert and oriented to person, place, and time.  Psychiatric:        Mood and Affect: Mood normal.        Behavior: Behavior normal.       Labs and Imaging No results found for this or any previous visit (from the past 24 hour(s)).  No results found.   Assessment & Plan:  1. Screen for STD (sexually transmitted disease) - Patient not experiencing signs or symptoms of STD. Screening for STDs. - Cervicovaginal ancillary only( [place]) - HIV Antibody (routine testing w rflx) - RPR - Hepatitis B Surface AntiGEN - Hepatitis C Antibody  2. Influenza - Patient given annual flu  vaccine. - Flu Vaccine QUAD 36+ mos IM  3. Women's annual routine gynecological examination - Patient doing well. Concerns about acne in regards to PCOS. - Patient given referral to dermatology because patient does not want to start taking birth control at this time.  4. PCOS (polycystic ovarian syndrome) - Patient concerned with acne regarding PCOS. Patient does not want to start taking birth control at this time, so patient referred to dermatology. - A1c 5.7 in June 2021. Patient is prediabetic and started on glyburide because not able to tolerate metformin. - Ambulatory referral to Dermatology - glyBURIDE (DIABETA) 5 MG tablet; Take 1 tablet (5 mg total) by mouth daily with breakfast.   Dispense: 60 tablet; Refill: 5   Gigi Gin, [place] 03/14/2020 9:13 AM

## 2020-03-15 ENCOUNTER — Ambulatory Visit (HOSPITAL_COMMUNITY): Admission: RE | Admit: 2020-03-15 | Payer: Medicaid Other | Source: Ambulatory Visit

## 2020-03-15 LAB — CERVICOVAGINAL ANCILLARY ONLY
Bacterial Vaginitis (gardnerella): NEGATIVE
Candida Glabrata: NEGATIVE
Candida Vaginitis: POSITIVE — AB
Chlamydia: NEGATIVE
Comment: NEGATIVE
Comment: NEGATIVE
Comment: NEGATIVE
Comment: NEGATIVE
Comment: NEGATIVE
Comment: NORMAL
Neisseria Gonorrhea: NEGATIVE
Trichomonas: POSITIVE — AB

## 2020-03-15 LAB — HIV ANTIBODY (ROUTINE TESTING W REFLEX): HIV Screen 4th Generation wRfx: NONREACTIVE

## 2020-03-15 LAB — HEPATITIS C ANTIBODY: Hep C Virus Ab: <0.1 s/co ratio (ref 0.0–0.9)

## 2020-03-15 LAB — HEPATITIS B SURFACE ANTIGEN: Hepatitis B Surface Ag: NEGATIVE

## 2020-03-15 LAB — RPR: RPR Ser Ql: NONREACTIVE

## 2020-03-15 MED ORDER — FLUCONAZOLE 150 MG PO TABS: 150.0000 mg | ORAL_TABLET | Freq: Every day | ORAL | 1 refills | Status: DC

## 2020-03-15 MED ORDER — TINIDAZOLE 500 MG PO TABS: 2.0000 g | ORAL_TABLET | Freq: Once | ORAL | 0 refills | Status: AC

## 2020-03-15 NOTE — Addendum Note (Signed)
Addended by: Sharyon Cable on: 03/15/2020 03:34 PM   Modules accepted: Orders

## 2020-03-21 ENCOUNTER — Encounter: Payer: Self-pay | Admitting: Family Medicine

## 2020-03-22 ENCOUNTER — Other Ambulatory Visit: Payer: Self-pay | Admitting: Family Medicine

## 2020-03-22 MED ORDER — BENZONATATE 100 MG PO CAPS: 100.0000 mg | ORAL_CAPSULE | Freq: Three times a day (TID) | ORAL | 0 refills | Status: DC | PRN

## 2020-03-22 MED ORDER — ALBUTEROL SULFATE HFA 108 (90 BASE) MCG/ACT IN AERS: 2.0000 | INHALATION_SPRAY | RESPIRATORY_TRACT | 0 refills | Status: DC | PRN

## 2020-03-23 ENCOUNTER — Ambulatory Visit: Payer: Self-pay

## 2020-03-23 ENCOUNTER — Other Ambulatory Visit: Payer: Self-pay

## 2020-03-23 ENCOUNTER — Ambulatory Visit
Admission: RE | Admit: 2020-03-23 | Discharge: 2020-03-23 | Disposition: A | Discharge status: Home / Self Care | Payer: Medicaid Other | Source: Ambulatory Visit

## 2020-03-23 VITALS — BP 136/93 | HR 99 | Temp 98.4°F | Resp 20

## 2020-03-23 DIAGNOSIS — J069 Acute upper respiratory infection, unspecified: Secondary | ICD-10-CM | POA: Diagnosis not present

## 2020-03-23 MED ORDER — IBUPROFEN 800 MG PO TABS: 800.0000 mg | ORAL_TABLET | Freq: Three times a day (TID) | ORAL | 0 refills | Status: DC

## 2020-03-23 MED ORDER — FLUTICASONE PROPIONATE 50 MCG/ACT NA SUSP
1.0000 | Freq: Every day | NASAL | 0 refills | Status: DC
Start: 2020-03-23 — End: 2020-07-04

## 2020-03-23 MED ORDER — BENZONATATE 200 MG PO CAPS: 200.0000 mg | ORAL_CAPSULE | Freq: Three times a day (TID) | ORAL | 0 refills | Status: AC | PRN

## 2020-03-23 NOTE — Discharge Instructions (Signed)
Covid test pending, monitor my chart for results Rest and fluids Tylenol and ibuprofen as needed for fevers body aches headaches Tessalon every 8 hours as needed for cough May continue Mucinex DM to further help with congestion and cough Flonase nasal spray 1 to 2 spray to help with congestion and drainage Follow-up if not improving or worsening

## 2020-03-23 NOTE — ED Provider Notes (Signed)
[facility]    CSN: [id number] Arrival date & time: 03/23/20  1104      History   Chief Complaint Chief Complaint  Patient presents with  . Cough    HPI Ann Moran is a 34 y.o. female history of asthma presenting today for evaluation of cough congestion headache.  Reports symptoms began Sunday and have persisted over the past 4 days.  Daughter here with similar symptoms.  Reports fevers at home.  Has having some chest discomfort with cough.  Denies chest discomfort at rest.  Reports a lot of mucus production and drainage.  HPI  Past Medical History:  Diagnosis Date  . Allergy   . Anemia   . Anxiety   . Chlamydia   . Depression    hx of meds, none currently- doing ok  . Diabetes mellitus without complication (HCC)   . GERD (gastroesophageal reflux disease)   . Infection due to trichomonas   . MRSA (methicillin resistant staph aureus) culture positive    Dec 2012  . Obesity   . Pregnancy induced hypertension   . Urinary tract infection     Patient Active Problem List   Diagnosis Date Noted  . Dysphagia 05/20/2019  . Encounter for other preprocedural examination 05/20/2019  . PCOS (polycystic ovarian syndrome) 02/24/2019  . Neck pain 02/24/2019  . Morbid obesity with body mass index (BMI) of 45.0 to 49.9 in adult [facility]) 02/24/2019  . Essential hypertension 02/24/2019  . Gastroesophageal reflux disease 02/24/2019  . Irregular menstrual cycle 01/14/2019  . Abnormal uterine bleeding 01/14/2019  . Chronic cough 01/14/2019    Past Surgical History:  Procedure Laterality Date  . CHOLECYSTECTOMY    . ECTOPIC PREGNANCY SURGERY      OB History    Gravida  2   Para  1   Term  0   Preterm  1   AB  1   Living  1     SAB      TAB      Ectopic  1   Multiple      Live Births  1            Home Medications    Prior to Admission medications   Medication Sig Start Date End Date Taking? Authorizing Provider  amLODipine (NORVASC)  10 MG tablet Take 10 mg by mouth daily.   Yes [provider]  albuterol (VENTOLIN HFA) 108 (90 Base) MCG/ACT inhaler Inhale 2 puffs into the lungs every 4 (four) hours as needed for wheezing or shortness of breath. 03/22/20   Hoy Register, MD  benzonatate (TESSALON) 200 MG capsule Take 1 capsule (200 mg total) by mouth 3 (three) times daily as needed for up to 7 days for cough. 03/23/20 03/30/20  Malkia Nippert C, PA-C  cyclobenzaprine (FLEXERIL) 5 MG tablet One pill in am and midday and up to 2 pills at bedtime as needed for muscle spasm 02/04/20   Fulp, Cammie, MD  fluticasone (FLONASE) 50 MCG/ACT nasal spray Place 1-2 sprays into both nostrils daily. 03/23/20   Maicy Filip C, PA-C  glyBURIDE (DIABETA) 5 MG tablet Take 1 tablet (5 mg total) by mouth daily with breakfast. 03/14/20   Sharyon Cable, CNM  hydrOXYzine (ATARAX/VISTARIL) 25 MG tablet Take 1 tablet (25 mg total) by mouth every 8 (eight) hours as needed. 10/19/19   Hoy Register, MD  ibuprofen (ADVIL) 800 MG tablet Take 1 tablet (800 mg total) by mouth 3 (three) times daily.  03/23/20   Audri Kozub C, PA-C  predniSONE (DELTASONE) 20 MG tablet 2 pills daily x 2 days, 1 pill daily x 2 days then 1/2 pill daily x 4 days; take after eating 02/04/20   Fulp, Cammie, MD  cetirizine (ZYRTEC) 10 MG tablet Take 1 tablet (10 mg total) by mouth daily. Patient not taking: Reported on 02/04/2020 01/08/20 02/12/20  Georgetta Haber, NP  citalopram (CELEXA) 20 MG tablet Take 1 tablet (20 mg total) by mouth daily. Patient not taking: Reported on 03/14/2020 10/19/19 03/23/20  Hoy Register, MD  medroxyPROGESTERone (PROVERA) 5 MG tablet Take 2 tablets (10 mg total) by mouth daily. Patient not taking: Reported on 07/20/2018 06/30/18 12/06/18  Geoffery Lyons, MD    Family History Family History  Problem Relation Age of Onset  . Hypertension Mother   . Diabetes Mother   . Asthma Father   . Hypertension Father   . Stroke Maternal  Grandmother   . Diabetes Paternal Grandmother   . Hypertension Paternal Grandmother   . Colon cancer Paternal Grandmother   . Colon cancer Paternal Aunt   . Esophageal cancer Neg Hx   . Stomach cancer Neg Hx   . Rectal cancer Neg Hx     Social History Social History   Tobacco Use  . Smoking status: Current Every Day Smoker    Packs/day: 0.50    Years: 9.00    Pack years: 4.50    Types: Cigarettes  . Smokeless tobacco: Never Used  Vaping Use  . Vaping Use: Former  . Quit date: 08/18/2015  Substance Use Topics  . Alcohol use: Yes    Comment: occasional  . Drug use: No     Allergies   Patient has no known allergies.   Review of Systems Review of Systems  Constitutional: Positive for fatigue and fever. Negative for activity change, appetite change and chills.  HENT: Positive for congestion and rhinorrhea. Negative for ear pain, sinus pressure, sore throat and trouble swallowing.   Eyes: Negative for discharge and redness.  Respiratory: Positive for cough. Negative for chest tightness and shortness of breath.   Cardiovascular: Negative for chest pain.  Gastrointestinal: Negative for abdominal pain, diarrhea, nausea and vomiting.  Musculoskeletal: Negative for myalgias.  Skin: Negative for rash.  Neurological: Positive for headaches. Negative for dizziness and light-headedness.     Physical Exam Triage Vital Signs ED Triage Vitals  Enc Vitals Group     BP 03/23/20 1128 (!) 136/93     Pulse Rate 03/23/20 1128 99     Resp 03/23/20 1128 20     Temp 03/23/20 1128 98.4 F (36.9 C)     Temp Source 03/23/20 1128 Oral     SpO2 03/23/20 1128 93 %     Weight --      Height --      Head Circumference --      Peak Flow --      Pain Score 03/23/20 1126 6     Pain Loc --      Pain Edu? --      Excl. in GC? --    No data found.  Updated Vital Signs BP (!) 136/93 (BP Location: Left Arm)   Pulse 99   Temp 98.4 F (36.9 C) (Oral)   Resp 20   LMP 02/29/2020  (Approximate)   SpO2 93%   Visual Acuity Right Eye Distance:   Left Eye Distance:   Bilateral Distance:    Right Eye Near:  Left Eye Near:    Bilateral Near:     Physical Exam Vitals and nursing note reviewed.  Constitutional:      Appearance: She is well-developed.     Comments: No acute distress  HENT:     Head: Normocephalic and atraumatic.     Ears:     Comments: Bilateral ears without tenderness to palpation of external auricle, tragus and mastoid, EAC's without erythema or swelling, TM's with good bony landmarks and cone of light. Non erythematous.     Nose: Nose normal.     Mouth/Throat:     Comments: Oral mucosa pink and moist, no tonsillar enlargement or exudate. Posterior pharynx patent and nonerythematous, no uvula deviation or swelling. Normal phonation. Eyes:     Conjunctiva/sclera: Conjunctivae normal.  Cardiovascular:     Rate and Rhythm: Normal rate.  Pulmonary:     Effort: Pulmonary effort is normal. No respiratory distress.     Comments: Breathing comfortably at rest, CTABL, no wheezing, rales or other adventitious sounds auscultated Abdominal:     General: There is no distension.  Musculoskeletal:        General: Normal range of motion.     Cervical back: Neck supple.  Skin:    General: Skin is warm and dry.  Neurological:     Mental Status: She is alert and oriented to person, place, and time.      UC Treatments / Results  Labs (all labs ordered are listed, but only abnormal results are displayed) Labs Reviewed  NOVEL CORONAVIRUS, NAA    EKG   Radiology No results found.  Procedures Procedures (including critical care time)  Medications Ordered in UC Medications - No data to display  Initial Impression / Assessment and Plan / UC Course  I have reviewed the triage vital signs and the nursing notes.  Pertinent labs & imaging results that were available during my care of the patient were reviewed by me and considered in my medical  decision making (see chart for details).     Viral URI with cough-exam reassuring, lungs clear to auscultation recommend symptomatic and supportive care.  Covid test pending.  Rest and fluids. Discussed strict return precautions. Patient verbalized understanding and is agreeable with plan.   Final Clinical Impressions(s) / UC Diagnoses   Final diagnoses:  Viral URI with cough     Discharge Instructions     Covid test pending, monitor my chart for results Rest and fluids Tylenol and ibuprofen as needed for fevers body aches headaches Tessalon every 8 hours as needed for cough May continue Mucinex DM to further help with congestion and cough Flonase nasal spray 1 to 2 spray to help with congestion and drainage Follow-up if not improving or worsening    ED Prescriptions    Medication Sig Dispense Auth. Provider   benzonatate (TESSALON) 200 MG capsule Take 1 capsule (200 mg total) by mouth 3 (three) times daily as needed for up to 7 days for cough. 28 capsule Rima Blizzard C, PA-C   fluticasone (FLONASE) 50 MCG/ACT nasal spray Place 1-2 sprays into both nostrils daily. 16 g Appollonia Klee C, PA-C   ibuprofen (ADVIL) 800 MG tablet Take 1 tablet (800 mg total) by mouth 3 (three) times daily. 21 tablet Rinaldo Macqueen, [place] C, PA-C     PDMP not reviewed this encounter.   Lew Dawes, PA-C 03/23/20 1211

## 2020-03-23 NOTE — ED Triage Notes (Signed)
Cough, congestion, headache since Sunday

## 2020-03-25 ENCOUNTER — Encounter: Payer: Self-pay | Admitting: Family Medicine

## 2020-03-25 LAB — NOVEL CORONAVIRUS, NAA: SARS-CoV-2, NAA: NOT DETECTED

## 2020-03-25 LAB — SARS-COV-2, NAA 2 DAY TAT

## 2020-03-26 ENCOUNTER — Encounter (HOSPITAL_COMMUNITY): Payer: Self-pay

## 2020-03-26 ENCOUNTER — Other Ambulatory Visit: Payer: Self-pay

## 2020-03-26 DIAGNOSIS — Z23 Encounter for immunization: Secondary | ICD-10-CM | POA: Diagnosis not present

## 2020-03-26 DIAGNOSIS — W2209XA Striking against other stationary object, initial encounter: Secondary | ICD-10-CM | POA: Insufficient documentation

## 2020-03-26 DIAGNOSIS — S61412A Laceration without foreign body of left hand, initial encounter: Secondary | ICD-10-CM | POA: Diagnosis not present

## 2020-03-26 DIAGNOSIS — E119 Type 2 diabetes mellitus without complications: Secondary | ICD-10-CM | POA: Insufficient documentation

## 2020-03-26 DIAGNOSIS — I10 Essential (primary) hypertension: Secondary | ICD-10-CM | POA: Diagnosis not present

## 2020-03-26 DIAGNOSIS — Z79899 Other long term (current) drug therapy: Secondary | ICD-10-CM | POA: Diagnosis not present

## 2020-03-26 DIAGNOSIS — F17210 Nicotine dependence, cigarettes, uncomplicated: Secondary | ICD-10-CM | POA: Diagnosis not present

## 2020-03-26 DIAGNOSIS — S6992XA Unspecified injury of left wrist, hand and finger(s), initial encounter: Secondary | ICD-10-CM | POA: Diagnosis present

## 2020-03-26 NOTE — ED Triage Notes (Signed)
Pt to ED by POV from home with c/o approx 1 1/2 cm laceration in between her ring and middle finger on her left hand, bleeding is contolled at this time. Pt reports getting frustrated and hitting her hand on her desk resulting in injury. Arrives A+O, VSS, NADN.

## 2020-03-27 ENCOUNTER — Emergency Department (HOSPITAL_COMMUNITY)
Admission: EM | Admit: 2020-03-27 | Discharge: 2020-03-27 | Disposition: A | Discharge status: Home / Self Care | Payer: Medicaid Other | Attending: Emergency Medicine | Admitting: Emergency Medicine

## 2020-03-27 ENCOUNTER — Encounter: Payer: Self-pay | Admitting: Family Medicine

## 2020-03-27 ENCOUNTER — Emergency Department (HOSPITAL_COMMUNITY): Payer: Medicaid Other

## 2020-03-27 DIAGNOSIS — S61412A Laceration without foreign body of left hand, initial encounter: Secondary | ICD-10-CM

## 2020-03-27 MED ORDER — TETANUS-DIPHTH-ACELL PERTUSSIS 5-2.5-18.5 LF-MCG/0.5 IM SUSY
0.5000 mL | PREFILLED_SYRINGE | Freq: Once | INTRAMUSCULAR | Status: AC
  Administered 2020-03-27: 0.5 mL via INTRAMUSCULAR
  Filled 2020-03-27: qty 0.5

## 2020-03-27 MED ORDER — LIDOCAINE HCL (PF) 1 % IJ SOLN
10.0000 mL | Freq: Once | INTRAMUSCULAR | Status: AC
  Administered 2020-03-27: 10 mL
  Filled 2020-03-27: qty 30

## 2020-03-27 NOTE — ED Provider Notes (Signed)
[facility]-EMERGENCY DEPT Provider Note   CSN: [id number] Arrival date & time: 03/26/20  2239     History Chief Complaint  Patient presents with  . Laceration    Ann Moran is a 34 y.o. female who presents to the emergency department with complaints of left hand injury that occurred around 6 PM.  Patient states she became frustrated and hit her hand on a desk resulting in pain, swelling, and laceration. Pain worse with movement. No alleviating factors.  Patient is right-hand dominant.  She denies numbness, tingling, or weakness.  Unknown last tetanus.  No other areas of injury.  HPI     Past Medical History:  Diagnosis Date  . Allergy   . Anemia   . Anxiety   . Chlamydia   . Depression    hx of meds, none currently- doing ok  . Diabetes mellitus without complication (HCC)   . GERD (gastroesophageal reflux disease)   . Infection due to trichomonas   . MRSA (methicillin resistant staph aureus) culture positive    Dec 2012  . Obesity   . Pregnancy induced hypertension   . Urinary tract infection     Patient Active Problem List   Diagnosis Date Noted  . Dysphagia 05/20/2019  . Encounter for other preprocedural examination 05/20/2019  . PCOS (polycystic ovarian syndrome) 02/24/2019  . Neck pain 02/24/2019  . Morbid obesity with body mass index (BMI) of 45.0 to 49.9 in adult [facility]) 02/24/2019  . Essential hypertension 02/24/2019  . Gastroesophageal reflux disease 02/24/2019  . Irregular menstrual cycle 01/14/2019  . Abnormal uterine bleeding 01/14/2019  . Chronic cough 01/14/2019    Past Surgical History:  Procedure Laterality Date  . CHOLECYSTECTOMY    . ECTOPIC PREGNANCY SURGERY       OB History    Gravida  2   Para  1   Term  0   Preterm  1   AB  1   Living  1     SAB      TAB      Ectopic  1   Multiple      Live Births  1           Family History  Problem Relation Age of Onset  . Hypertension Mother   .  Diabetes Mother   . Asthma Father   . Hypertension Father   . Stroke Maternal Grandmother   . Diabetes Paternal Grandmother   . Hypertension Paternal Grandmother   . Colon cancer Paternal Grandmother   . Colon cancer Paternal Aunt   . Esophageal cancer Neg Hx   . Stomach cancer Neg Hx   . Rectal cancer Neg Hx     Social History   Tobacco Use  . Smoking status: Current Every Day Smoker    Packs/day: 0.50    Years: 9.00    Pack years: 4.50    Types: Cigarettes  . Smokeless tobacco: Never Used  Vaping Use  . Vaping Use: Former  . Quit date: 08/18/2015  Substance Use Topics  . Alcohol use: Yes    Comment: occasional  . Drug use: No    Home Medications Prior to Admission medications   Medication Sig Start Date End Date Taking? Authorizing Provider  albuterol (VENTOLIN HFA) 108 (90 Base) MCG/ACT inhaler Inhale 2 puffs into the lungs every 4 (four) hours as needed for wheezing or shortness of breath. 03/22/20   Hoy Register, MD  amLODipine (NORVASC) 10 MG tablet Take  10 mg by mouth daily.    [provider]  benzonatate (TESSALON) 200 MG capsule Take 1 capsule (200 mg total) by mouth 3 (three) times daily as needed for up to 7 days for cough. 03/23/20 03/30/20  Wieters, Hallie C, PA-C  cyclobenzaprine (FLEXERIL) 5 MG tablet One pill in am and midday and up to 2 pills at bedtime as needed for muscle spasm 02/04/20   Fulp, Cammie, MD  fluticasone (FLONASE) 50 MCG/ACT nasal spray Place 1-2 sprays into both nostrils daily. 03/23/20   Wieters, Hallie C, PA-C  glyBURIDE (DIABETA) 5 MG tablet Take 1 tablet (5 mg total) by mouth daily with breakfast. 03/14/20   Sharyon Cableogers, Veronica C, CNM  hydrOXYzine (ATARAX/VISTARIL) 25 MG tablet Take 1 tablet (25 mg total) by mouth every 8 (eight) hours as needed. 10/19/19   Hoy RegisterNewlin, Enobong, MD  ibuprofen (ADVIL) 800 MG tablet Take 1 tablet (800 mg total) by mouth 3 (three) times daily. 03/23/20   Wieters, Hallie C, PA-C  predniSONE (DELTASONE) 20 MG  tablet 2 pills daily x 2 days, 1 pill daily x 2 days then 1/2 pill daily x 4 days; take after eating 02/04/20   Fulp, Cammie, MD  cetirizine (ZYRTEC) 10 MG tablet Take 1 tablet (10 mg total) by mouth daily. Patient not taking: Reported on 02/04/2020 01/08/20 02/12/20  Georgetta HaberBurky, Natalie B, NP  citalopram (CELEXA) 20 MG tablet Take 1 tablet (20 mg total) by mouth daily. Patient not taking: Reported on 03/14/2020 10/19/19 03/23/20  Hoy RegisterNewlin, Enobong, MD  medroxyPROGESTERone (PROVERA) 5 MG tablet Take 2 tablets (10 mg total) by mouth daily. Patient not taking: Reported on 07/20/2018 06/30/18 12/06/18  Geoffery Lyonselo, Douglas, MD    Allergies    Patient has no known allergies.  Review of Systems   Review of Systems  Constitutional: Negative for chills and fever.  Respiratory: Negative for shortness of breath.   Cardiovascular: Negative for chest pain.  Gastrointestinal: Negative for abdominal pain.  Musculoskeletal: Positive for arthralgias and joint swelling.  Skin: Positive for wound.  Neurological: Negative for weakness and numbness.  All other systems reviewed and are negative.   Physical Exam Updated Vital Signs BP (!) 149/107 (BP Location: Right Arm)   Pulse 92   Temp 98.7 F (37.1 C) (Oral)   Resp 18   Ht 5\' 9"  (1.753 m)   Wt 131 kg   LMP 02/29/2020 (Approximate)   SpO2 98%   BMI 42.65 kg/m   Physical Exam Vitals and nursing note reviewed.  Constitutional:      General: She is not in acute distress.    Appearance: Normal appearance. She is not ill-appearing or toxic-appearing.  HENT:     Head: Normocephalic and atraumatic.  Neck:     Comments: No midline tenderness.  Cardiovascular:     Rate and Rhythm: Normal rate.     Pulses:          Radial pulses are 2+ on the right side and 2+ on the left side.  Pulmonary:     Effort: No respiratory distress.     Breath sounds: Normal breath sounds.  Musculoskeletal:     Cervical back: Normal range of motion and neck supple.     Comments:  Upper extremities: Left hand: Patient has mild swelling to the third and fourth MCP joints with an approximately 2 cm length laceration to the webspace between the third and fourth digits that is approximately 4  mm deep.  There is no active bleeding.  No  visible foreign bodies.  No visible tendon injury.  Patient has intact active range of motion throughout the bilateral upper extremities, specifically is able to flex/extend all IP/MCP joints and is able to do so against resistance.  She is tender to palpation to the left third and fourth MCPs, proximal phalanges, and PIP joints.  Otherwise nontender.  No anatomical snuffbox tenderness.  Skin:    General: Skin is warm and dry.     Capillary Refill: Capillary refill takes less than 2 seconds.  Neurological:     Mental Status: She is alert.     Comments: Alert. Clear speech. Sensation grossly intact to bilateral upper extremities. 5/5 symmetric grip strength. Ambulatory.  Able to perform okay sign, thumbs up, cross second/third digits bilaterally.  Psychiatric:        Mood and Affect: Mood normal.        Behavior: Behavior normal.     ED Results / Procedures / Treatments   Labs (all labs ordered are listed, but only abnormal results are displayed) Labs Reviewed - No data to display  EKG None  Radiology DG Hand Complete Left  Result Date: 03/27/2020 CLINICAL DATA:  Injury, laceration EXAM: LEFT HAND - COMPLETE 3+ VIEW COMPARISON:  None. FINDINGS: Osseous alignment is normal. No fracture line or displaced fracture fragment is seen. No radiodense foreign body within the soft tissues. IMPRESSION: Negative. Electronically Signed   By: Bary Richard M.D.   On: 03/27/2020 05:01    Procedures .Marland KitchenLaceration Repair  Date/Time: 03/27/2020 6:04 AM Performed by: Cherly Anderson, PA-C Authorized by: Cherly Anderson, PA-C   Consent:    Consent obtained:  Verbal   Consent given by:  Patient   Risks discussed:  Infection, nerve damage,  need for additional repair, pain, poor cosmetic result, poor wound healing, vascular damage, retained foreign body and tendon damage   Alternatives discussed:  No treatment Anesthesia (see MAR for exact dosages):    Anesthesia method:  Local infiltration   Local anesthetic:  Lidocaine 1% w/o epi Laceration details:    Location:  Hand   Hand location: web space between left 3rd/4th digits.   Length (cm):  2   Depth (mm):  4 Repair type:    Repair type:  Simple Pre-procedure details:    Preparation:  Patient was prepped and draped in usual sterile fashion and imaging obtained to evaluate for foreign bodies Exploration:    Hemostasis achieved with:  Direct pressure   Wound exploration: wound explored through full range of motion and entire depth of wound probed and visualized     Contaminated: no   Treatment:    Area cleansed with:  Betadine   Amount of cleaning:  Standard   Irrigation solution:  Sterile water   Irrigation method:  Pressure wash Skin repair:    Repair method:  Sutures   Suture size: 4-0 vicryl rapide.   Number of sutures:  3 Approximation:    Approximation:  Loose Post-procedure details:    Dressing:  Non-adherent dressing   Patient tolerance of procedure:  Tolerated well, no immediate complications   (including critical care time)  Medications Ordered in ED Medications  lidocaine (PF) (XYLOCAINE) 1 % injection 10 mL (10 mLs Infiltration Given 03/27/20 0517)  Tdap (BOOSTRIX) injection 0.5 mL (0.5 mLs Intramuscular Given 03/27/20 0517)    ED Course  I have reviewed the triage vital signs and the nursing notes.  Pertinent labs & imaging results that were available during my care of the  patient were reviewed by me and considered in my medical decision making (see chart for details).    MDM Rules/Calculators/A&P                         Patient presents to the emergency department with injury to her left hand.  She is nontoxic, resting comfortably, vitals  without significant abnormality.  Tray obtained, personally reviewed and interpreted by me, no fracture or dislocation.  Patient is neurovascularly intact distally to her laceration.  Wound was cleansed and pressure irrigated, visualized in a bloodless field, no evidence of foreign body or tendon injury.  Repaired per procedure note above.  Subsequently applied nonadherent dressing.  Patient neurovascularly intact distally prior to and following procedure, good cap refill, able to flex/extend MCP/IP joints against resistance, sensation grossly intact. I discussed results, treatment plan, need for follow-up, and return precautions with the patient. Provided opportunity for questions, patient confirmed understanding and is in agreement with plan. Findings and plan of care discussed with supervising physician Dr. Preston Fleeting who is in agreement.   Final Clinical Impression(s) / ED Diagnoses Final diagnoses:  Laceration of left hand without foreign body, initial encounter    Rx / DC Orders ED Discharge Orders    None       Cherly Anderson, PA-C 03/27/20 0610    Dione Booze, MD 03/27/20 [phone]

## 2020-03-27 NOTE — Discharge Instructions (Addendum)
You were seen in the emergency department today for a laceration.  Your x-ray did not show any fractures or dislocations.  Your laceration was closed with 3 absorbable stitches. Please keep this area clean and dry for the next 24 hours, after 24 hours you may get this area wet, but avoid soaking the area. Keep the area covered as best possible especially when in the sun to help in minimizing scarring.   Your tetanus has been updated  The stitches will dissolve on their own, these will be able to be gently pulled out once they are loose, this will likely not be until least 1 week.  Please have the wound rechecked by your primary care provider in 5 days.. Return to the ER soon should you start to experience pus type drainage from the wound, redness around the wound, or fevers as this could indicate the area is infected, please return to the ER for any other worsening symptoms or concerns that you may have.

## 2020-03-28 ENCOUNTER — Ambulatory Visit: Payer: Medicaid Other | Admitting: Cardiology

## 2020-03-31 NOTE — Progress Notes (Deleted)
Cardiology Office Note:    Date:  03/31/2020   ID:  Ann Moran, DOB 1985/06/14, MRN [id number]  PCP:  Hoy Register, MD  Cardiologist:  No primary care provider on file.  Electrophysiologist:  None   Referring MD: Hoy Register, MD   No chief complaint on file.   History of Present Illness:    Ann Moran is a 34 y.o. female with a hx of prediabetes, hypertension, obesity, anxiety/depression, tobacco use who presents for follow-up.  She was referred by Georgian Co, PA for evaluation of syncope and family history of aneurysms, initially seen on 11/03/2019.  She reports that she has had 4 episodes of cough induced syncope over last year.  Reports last episode happened 1 month ago.  She was sitting on her couch and began having a coughing spell and started to urinate.  She stood up to go to the bathroom and passed out.  Reports other 3 times where she passed out occurred while sitting after having coughing spell.  States she has frequent coughing spells that usually occur after she smokes.  She does not exercise, reports most exercise she does is walking around her house.  She denies any chest pain or dyspnea.  Occasionally feels like her heart is racing, typically occurs when she is upset.  She denies any lower extremity edema.  She denies any lightheadedness outside of her syncopal episodes.  Reports that she has been told that she snores and stops breathing while she sleeps.  She stopped taking all her BP medications 2 weeks ago.  Has not been checking BP at home.  Smokes 0.5 pack/day.  Father had MI in 69s.  Mother had cerebral aneurysm in 30s  Zio patch x11 days on 11/11/2019 showed no significant arrhythmias.  Since last clinic visit,   Past Medical History:  Diagnosis Date  . Allergy   . Anemia   . Anxiety   . Chlamydia   . Depression    hx of meds, none currently- doing ok  . Diabetes mellitus without complication (HCC)   . GERD (gastroesophageal reflux disease)    . Infection due to trichomonas   . MRSA (methicillin resistant staph aureus) culture positive    Dec 2012  . Obesity   . Pregnancy induced hypertension   . Urinary tract infection     Past Surgical History:  Procedure Laterality Date  . CHOLECYSTECTOMY    . ECTOPIC PREGNANCY SURGERY      Current Medications: No outpatient medications have been marked as taking for the 04/01/20 encounter (Appointment) with Little Ishikawa, MD.     Allergies:   Patient has no known allergies.   Social History   Socioeconomic History  . Marital status: Single    Spouse name: Not on file  . Number of children: Not on file  . Years of education: Not on file  . Highest education level: Not on file  Occupational History  . Not on file  Tobacco Use  . Smoking status: Current Every Day Smoker    Packs/day: 0.50    Years: 9.00    Pack years: 4.50    Types: Cigarettes  . Smokeless tobacco: Never Used  Vaping Use  . Vaping Use: Former  . Quit date: 08/18/2015  Substance and Sexual Activity  . Alcohol use: Yes    Comment: occasional  . Drug use: No  . Sexual activity: Yes    Birth control/protection: None  Other Topics Concern  . Not on file  Social History Narrative  . Not on file   Social Determinants of Health   Financial Resource Strain: Not on file  Food Insecurity: No Food Insecurity  . Worried About Programme researcher, broadcasting/film/video in the Last Year: Never true  . Ran Out of Food in the Last Year: Never true  Transportation Needs: No Transportation Needs  . Lack of Transportation (Medical): No  . Lack of Transportation (Non-Medical): No  Physical Activity: Not on file  Stress: Not on file  Social Connections: Not on file     Family History: The patient's family history includes Asthma in her father; Colon cancer in her paternal aunt and paternal grandmother; Diabetes in her mother and paternal grandmother; Hypertension in her father, mother, and paternal grandmother; Stroke in  her maternal grandmother. There is no history of Esophageal cancer, Stomach cancer, or Rectal cancer.  ROS:   Please see the history of present illness.     All other systems reviewed and are negative.  EKGs/Labs/Other Studies Reviewed:    The following studies were reviewed today:   EKG:  EKG is ordered today.  The ekg ordered today demonstrates sinus tachycardia, rate 105, QTc 473 T wave inversion in lead III  Recent Labs: 09/24/2019: TSH 1.930 11/11/2019: ALT 19; BUN <5; Creatinine, Ser 0.74; Hemoglobin 13.2; Platelets 289; Potassium 4.1; Sodium 141  Recent Lipid Panel No results found for: CHOL, TRIG, HDL, CHOLHDL, VLDL, LDLCALC, LDLDIRECT  Physical Exam:    VS:  There were no vitals taken for this visit.    Wt Readings from Last 3 Encounters:  03/26/20 288 lb 12.8 oz (131 kg)  03/14/20 (!) 313 lb 9.6 oz (142.2 kg)  02/28/20 300 lb (136.1 kg)     GEN:   in no acute distress HEENT: Normal NECK: No JVD; No carotid bruits LYMPHATICS: No lymphadenopathy CARDIAC: regular, tachycardic, no murmurs, rubs, gallops RESPIRATORY:  Clear to auscultation without rales, wheezing or rhonchi  ABDOMEN: Soft, non-tender, non-distended MUSCULOSKELETAL:  No edema; No deformity  SKIN: Warm and dry NEUROLOGIC:  Alert and oriented x 3 PSYCHIATRIC:  Normal affect   ASSESSMENT:    No diagnosis found. PLAN:    Syncope: Description suggestive of vasovagal syncope triggered by coughing.  Will check echocardiogram to rule out structural heart disease.  Zio patch x11 days on 11/11/2019 showed no significant arrhythmias.  Family history of aneurysms: Significant family history of cerebral aneurysms, reports mother, aunt, and 2 cousins had cerebral aneurysms.  Recommend screening with MRA head.  Hypertension: Had been on amlodipine 10 mg daily, losartan 100 mg daily, spironolactone 25 mg daily.  Reports has been off all medications for past 2 weeks.  BP mildly elevated in clinic today, will restart  amlodipine 10 mg daily.  Asked patient to monitor BP daily for next 2 weeks and call with results, will fold other antihypertensives back in if needed.  We will screen for OSA.  OSA: Sleep study was positive for OSA, started on CPAP  Prediabetes: On Metformin  Tobacco use: Patient counseled on risk of tobacco use and cessation strongly encouraged  RTC in ***  Medication Adjustments/Labs and Tests Ordered: Current medicines are reviewed at length with the patient today.  Concerns regarding medicines are outlined above.  No orders of the defined types were placed in this encounter.  No orders of the defined types were placed in this encounter.   There are no Patient Instructions on file for this visit.   Signed, Little Ishikawa, MD  03/31/2020 10:48 PM     [facility]

## 2020-04-01 ENCOUNTER — Ambulatory Visit: Payer: Medicaid Other | Admitting: Cardiology

## 2020-04-06 ENCOUNTER — Ambulatory Visit (HOSPITAL_COMMUNITY): Admission: RE | Admit: 2020-04-06 | Payer: Medicaid Other | Source: Ambulatory Visit

## 2020-04-06 ENCOUNTER — Ambulatory Visit
Admission: EM | Admit: 2020-04-06 | Discharge: 2020-04-06 | Discharge status: Against Medical Advice | Payer: Medicaid Other

## 2020-04-06 ENCOUNTER — Ambulatory Visit: Payer: Self-pay

## 2020-04-06 NOTE — ED Triage Notes (Signed)
No answer from waiting  Area

## 2020-04-07 ENCOUNTER — Encounter: Payer: Self-pay | Admitting: Family Medicine

## 2020-04-07 ENCOUNTER — Ambulatory Visit
Admission: EM | Admit: 2020-04-07 | Discharge: 2020-04-07 | Disposition: A | Discharge status: Home / Self Care | Payer: Medicaid Other | Attending: Family Medicine | Admitting: Family Medicine

## 2020-04-07 DIAGNOSIS — M79642 Pain in left hand: Secondary | ICD-10-CM | POA: Diagnosis not present

## 2020-04-07 MED ORDER — DOXYCYCLINE HYCLATE 100 MG PO CAPS: 100.0000 mg | ORAL_CAPSULE | Freq: Two times a day (BID) | ORAL | 0 refills | Status: AC

## 2020-04-07 NOTE — ED Provider Notes (Signed)
Ann Moran    CSN: [id number] Arrival date & time: 04/07/20  1234      History   Chief Complaint Chief Complaint  Patient presents with  . Hand Injury    5 days ago     HPI Ann Moran is a 34 y.o. female.   Patient is a 35 year old female presents today for concern for infection of the left hand.  Injured this 5 days ago and was seen in the ER and had stitches placed.  Report over the last day or so she has had increased pain, draining from site and fever of 100.1 last night.  Treating with Tylenol and ibuprofen.  Mild itching at the site.     Past Medical History:  Diagnosis Date  . Allergy   . Anemia   . Anxiety   . Chlamydia   . Depression    hx of meds, none currently- doing ok  . Diabetes mellitus without complication (HCC)   . GERD (gastroesophageal reflux disease)   . Infection due to trichomonas   . MRSA (methicillin resistant staph aureus) culture positive    Dec 2012  . Obesity   . Pregnancy induced hypertension   . Urinary tract infection     Patient Active Problem List   Diagnosis Date Noted  . Dysphagia 05/20/2019  . Encounter for other preprocedural examination 05/20/2019  . PCOS (polycystic ovarian syndrome) 02/24/2019  . Neck pain 02/24/2019  . Morbid obesity with body mass index (BMI) of 45.0 to 49.9 in adult [facility]) 02/24/2019  . Essential hypertension 02/24/2019  . Gastroesophageal reflux disease 02/24/2019  . Irregular menstrual cycle 01/14/2019  . Abnormal uterine bleeding 01/14/2019  . Chronic cough 01/14/2019    Past Surgical History:  Procedure Laterality Date  . CHOLECYSTECTOMY    . ECTOPIC PREGNANCY SURGERY      OB History    Gravida  2   Para  1   Term  0   Preterm  1   AB  1   Living  1     SAB      IAB      Ectopic  1   Multiple      Live Births  1            Home Medications    Prior to Admission medications   Medication Sig Start Date End Date Taking? Authorizing Provider   albuterol (VENTOLIN HFA) 108 (90 Base) MCG/ACT inhaler Inhale 2 puffs into the lungs every 4 (four) hours as needed for wheezing or shortness of breath. 03/22/20   Hoy Register, MD  amLODipine (NORVASC) 10 MG tablet Take 10 mg by mouth daily.    [provider]  cyclobenzaprine (FLEXERIL) 5 MG tablet One pill in am and midday and up to 2 pills at bedtime as needed for muscle spasm 02/04/20   Fulp, Cammie, MD  doxycycline (VIBRAMYCIN) 100 MG capsule Take 1 capsule (100 mg total) by mouth 2 (two) times daily for 7 days. 04/07/20 04/14/20  Dahlia Byes A, NP  fluticasone (FLONASE) 50 MCG/ACT nasal spray Place 1-2 sprays into both nostrils daily. 03/23/20   Wieters, Hallie C, PA-C  glyBURIDE (DIABETA) 5 MG tablet Take 1 tablet (5 mg total) by mouth daily with breakfast. 03/14/20   Sharyon Cable, CNM  hydrOXYzine (ATARAX/VISTARIL) 25 MG tablet Take 1 tablet (25 mg total) by mouth every 8 (eight) hours as needed. 10/19/19   Hoy Register, MD  ibuprofen (ADVIL) 800 MG tablet  Take 1 tablet (800 mg total) by mouth 3 (three) times daily. 03/23/20   Wieters, Hallie C, PA-C  predniSONE (DELTASONE) 20 MG tablet 2 pills daily x 2 days, 1 pill daily x 2 days then 1/2 pill daily x 4 days; take after eating 02/04/20   Fulp, Cammie, MD  cetirizine (ZYRTEC) 10 MG tablet Take 1 tablet (10 mg total) by mouth daily. Patient not taking: Reported on 02/04/2020 01/08/20 02/12/20  Georgetta Haber, NP  citalopram (CELEXA) 20 MG tablet Take 1 tablet (20 mg total) by mouth daily. Patient not taking: Reported on 03/14/2020 10/19/19 03/23/20  Hoy Register, MD  medroxyPROGESTERone (PROVERA) 5 MG tablet Take 2 tablets (10 mg total) by mouth daily. Patient not taking: Reported on 07/20/2018 06/30/18 12/06/18  Geoffery Lyons, MD    Family History Family History  Problem Relation Age of Onset  . Hypertension Mother   . Diabetes Mother   . Asthma Father   . Hypertension Father   . Stroke Maternal Grandmother   .  Diabetes Paternal Grandmother   . Hypertension Paternal Grandmother   . Colon cancer Paternal Grandmother   . Colon cancer Paternal Aunt   . Esophageal cancer Neg Hx   . Stomach cancer Neg Hx   . Rectal cancer Neg Hx     Social History Social History   Tobacco Use  . Smoking status: Current Every Day Smoker    Packs/day: 0.50    Years: 9.00    Pack years: 4.50    Types: Cigarettes  . Smokeless tobacco: Never Used  Vaping Use  . Vaping Use: Former  . Quit date: 08/18/2015  Substance Use Topics  . Alcohol use: Yes    Comment: occasional  . Drug use: No     Allergies   Patient has no known allergies.   Review of Systems Review of Systems   Physical Exam Triage Vital Signs ED Triage Vitals  Enc Vitals Group     BP 04/07/20 1252 116/78     Pulse Rate 04/07/20 1252 98     Resp 04/07/20 1252 16     Temp 04/07/20 1252 98.2 F (36.8 C)     Temp Source 04/07/20 1252 Temporal     SpO2 04/07/20 1252 96 %     Weight 04/07/20 1247 (!) 301 lb (136.5 kg)     Height --      Head Circumference --      Peak Flow --      Pain Score 04/07/20 1247 8     Pain Loc --      Pain Edu? --      Excl. in GC? --    No data found.  Updated Vital Signs BP 116/78 (BP Location: Left Arm)   Pulse 98   Temp 98.2 F (36.8 C) (Temporal)   Resp 16   Wt (!) 301 lb (136.5 kg)   LMP  (Within Weeks) Comment: 2 weeks ago   SpO2 96%   BMI 44.45 kg/m   Visual Acuity Right Eye Distance:   Left Eye Distance:   Bilateral Distance:    Right Eye Near:   Left Eye Near:    Bilateral Near:     Physical Exam Vitals and nursing note reviewed.  Constitutional:      General: She is not in acute distress.    Appearance: Normal appearance. She is not ill-appearing, toxic-appearing or diaphoretic.  HENT:     Head: Normocephalic.     Nose: Nose normal.  Eyes:     Conjunctiva/sclera: Conjunctivae normal.  Pulmonary:     Effort: Pulmonary effort is normal.  Musculoskeletal:        General:  Normal range of motion.       Hands:     Cervical back: Normal range of motion.     Comments: Stitches in place to left hand.  There is swelling, erythema and pain to the area No obvious drainage  Skin:    General: Skin is warm and dry.     Findings: No rash.  Neurological:     Mental Status: She is alert.  Psychiatric:        Mood and Affect: Mood normal.      UC Treatments / Results  Labs (all labs ordered are listed, but only abnormal results are displayed) Labs Reviewed - No data to display  EKG   Radiology No results found.  Procedures Procedures (including critical care time)  Medications Ordered in UC Medications - No data to display  Initial Impression / Assessment and Plan / UC Course  I have reviewed the triage vital signs and the nursing notes.  Pertinent labs & imaging results that were available during my care of the patient were reviewed by me and considered in my medical decision making (see chart for details).     Hand pain Covering for possible developing infection in the hand Treating with doxycycline Recommend keep area clean and dry. Follow up as needed for continued or worsening symptoms   Final Clinical Impressions(s) / UC Diagnoses   Final diagnoses:  Hand pain, left     Discharge Instructions     Take the antibiotics as prescribed Keep area clean and dry.  Follow up as needed for continued or worsening symptoms     ED Prescriptions    Medication Sig Dispense Auth. Provider   doxycycline (VIBRAMYCIN) 100 MG capsule Take 1 capsule (100 mg total) by mouth 2 (two) times daily for 7 days. 14 capsule Leonela Kivi A, NP     PDMP not reviewed this encounter.   Janace Aris, NP 04/07/20 1322

## 2020-04-07 NOTE — Discharge Instructions (Addendum)
Take the antibiotics as prescribed Keep area clean and dry.  Follow up as needed for continued or worsening symptoms

## 2020-04-07 NOTE — ED Triage Notes (Signed)
Patient presents to Urgent Care with complaints of left hand injury 5 days ago. Pt had stiches placed at [facility] and states that the site is itchy with increased pain has had a fever last temp 100.1. Treating pain and fever with tylenol and ibuprofen.

## 2020-04-08 ENCOUNTER — Other Ambulatory Visit: Payer: Self-pay | Admitting: Family Medicine

## 2020-04-08 MED ORDER — CEPHALEXIN 500 MG PO CAPS: 500.0000 mg | ORAL_CAPSULE | Freq: Two times a day (BID) | ORAL | 0 refills | Status: DC

## 2020-04-15 ENCOUNTER — Ambulatory Visit: Payer: Self-pay

## 2020-05-09 NOTE — Telephone Encounter (Signed)
error 

## 2020-05-19 ENCOUNTER — Ambulatory Visit: Payer: Medicaid Other | Attending: Physician Assistant | Admitting: Physician Assistant

## 2020-05-19 ENCOUNTER — Encounter: Payer: Self-pay | Admitting: Physician Assistant

## 2020-05-19 ENCOUNTER — Other Ambulatory Visit: Payer: Self-pay

## 2020-05-19 ENCOUNTER — Ambulatory Visit: Payer: Self-pay

## 2020-05-19 DIAGNOSIS — M62830 Muscle spasm of back: Secondary | ICD-10-CM | POA: Diagnosis not present

## 2020-05-19 DIAGNOSIS — M25511 Pain in right shoulder: Secondary | ICD-10-CM

## 2020-05-19 DIAGNOSIS — M542 Cervicalgia: Secondary | ICD-10-CM

## 2020-05-19 MED ORDER — DICLOFENAC SODIUM 75 MG PO TBEC: 75.0000 mg | DELAYED_RELEASE_TABLET | Freq: Two times a day (BID) | ORAL | 0 refills | Status: DC

## 2020-05-19 MED ORDER — METHOCARBAMOL 500 MG PO TABS: 1000.0000 mg | ORAL_TABLET | Freq: Three times a day (TID) | ORAL | 0 refills | Status: DC
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
## 2020-05-20 ENCOUNTER — Ambulatory Visit (HOSPITAL_COMMUNITY): Payer: Self-pay

## 2020-05-24 ENCOUNTER — Ambulatory Visit: Payer: Self-pay | Admitting: Family Medicine

## 2020-05-24 NOTE — Telephone Encounter (Signed)
Pt reports sore throat x 2 weeks. States started as cough, stuffy nose, all resolved, sore throat remains. States throat is red, no visible white patches,burns when swallowing. Tested negative covid 5 days ago. Afebrile. Attempted to schedule MyChart video visit, no availability per 3 day disposition. Pt aware, home care advise given, advised UC if symptoms worsen. Assured would route to practice for providers  Review. Pt verbalizes understanding.  Reason for Disposition . [1] Sore throat is the only symptom AND [2] present > 48 hours  Answer Assessment - Initial Assessment Questions 1. ONSET: "When did the throat start hurting?" (Hours or days ago)      2 weeks ago 2. SEVERITY: "How bad is the sore throat?" (Scale 1-10; mild, moderate or severe)   - MILD (1-3):  doesn't interfere with eating or normal activities   - MODERATE (4-7): interferes with eating some solids and normal activities   - SEVERE (8-10):  excruciating pain, interferes with most normal activities   - SEVERE DYSPHAGIA: can't swallow liquids, drooling     8/10 3. STREP EXPOSURE: "Has there been any exposure to strep within the past week?" If Yes, ask: "What type of contact occurred?"      no 4.  VIRAL SYMPTOMS: "Are there any symptoms of a cold, such as a runny nose, cough, hoarse voice or red eyes?"      Cough and stuffy nose resolved, sore throat remains 5. FEVER: "Do you have a fever?" If Yes, ask: "What is your temperature, how was it measured, and when did it start?"     3 days ago temp 100.1 6. PUS ON THE TONSILS: "Is there pus on the tonsils in the back of your throat?"     No 7. OTHER SYMPTOMS: "Do you have any other symptoms?" (e.g., difficulty breathing, headache, rash)     Burns when you swallow.  Protocols used: SORE THROAT-A-AH

## 2020-05-24 NOTE — Telephone Encounter (Signed)
MA UTR patient x2 to offer appointment.

## 2020-05-25 ENCOUNTER — Encounter: Payer: Self-pay | Admitting: Physician Assistant

## 2020-05-25 ENCOUNTER — Telehealth (INDEPENDENT_AMBULATORY_CARE_PROVIDER_SITE_OTHER): Payer: Medicaid Other | Admitting: Physician Assistant

## 2020-05-25 DIAGNOSIS — J029 Acute pharyngitis, unspecified: Secondary | ICD-10-CM | POA: Diagnosis not present

## 2020-05-25 NOTE — Patient Instructions (Signed)
Continue symptomatic treatment as needed.  Please let us know if your symptoms worsen.  I am thankful that you are feeling better, I encourage you to get your Covid booster shot.  COVID-19 Vaccine Information can be found at: [email or web address] For questions related to vaccine distribution or appointments, please email [email or web address] or call [phone].    Please let us know if there is anything else we can do for you  [name], PA-C Physician Assistant [facility] [email or web address]   Sore Throat A sore throat is pain, burning, irritation, or scratchiness in the throat. When you have a sore throat, you may feel pain or tenderness in your throat when you swallow or talk. Many things can cause a sore throat, including:  An infection.  Seasonal allergies.  Dryness in the air.  Irritants, such as smoke or pollution.  Radiation treatment to the area.  Gastroesophageal reflux disease (GERD).  A tumor. A sore throat is often the first sign of another sickness. It may happen with other symptoms, such as coughing, sneezing, fever, and swollen neck glands. Most sore throats go away without medical treatment. Follow these instructions at home:  Take over-the-counter medicines only as told by your health care provider. ? If your child has a sore throat, do not give your child aspirin because of the association with Reye syndrome.  Drink enough fluids to keep your urine pale yellow.  Rest as needed.  To help with pain, try: ? Sipping warm liquids, such as broth, herbal tea, or warm water. ? Eating or drinking cold or frozen liquids, such as frozen ice pops. ? Gargling with a salt-water mixture 3-4 times a day or as needed. To make a salt-water mixture, completely dissolve -1 tsp (3-6 g) of salt in 1 cup (237 mL) of warm water. ? Sucking on hard candy or  throat lozenges. ? Putting a cool-mist humidifier in your bedroom at night to moisten the air. ? Sitting in the bathroom with the door closed for 5-10 minutes while you run hot water in the shower.  Do not use any products that contain nicotine or tobacco, such as cigarettes, e-cigarettes, and chewing tobacco. If you need help quitting, ask your health care provider.  Wash your hands well and often with soap and water. If soap and water are not available, use hand sanitizer.      Contact a health care provider if:  You have a fever for more than 2-3 days.  You have symptoms that last (are persistent) for more than 2-3 days.  Your throat does not get better within 7 days.  You have a fever and your symptoms suddenly get worse.  Your child who is 3 months to 5 years old has a temperature of 102.14F (39C) or higher. Get help right away if:  You have difficulty breathing.  You cannot swallow fluids, soft foods, or your saliva.  You have increased swelling in your throat or neck.  You have persistent nausea and vomiting. Summary  A sore throat is pain, burning, irritation, or scratchiness in the throat. Many things can cause a sore throat.  Take over-the-counter medicines only as told by your health care provider. Do not give your child aspirin.  Drink plenty of fluids, and rest as needed.  Contact a health care provider if your symptoms worsen or your sore throat does not get better within 7 days. This information is not intended to replace advice given to you by your health care provider. Make  sure you discuss any questions you have with your health care provider. Document Revised: 09/09/2017 Document Reviewed: 09/09/2017 Elsevier Patient Education  2021 [employer].

## 2020-05-25 NOTE — Progress Notes (Signed)
I connected with  Ann Moran on 05/25/20 by a video enabled telemedicine application and verified that I am speaking with the correct person using two identifiers.   I discussed the limitations of evaluation and management by telemedicine. The patient expressed understanding and agreed to proceed.    Established Patient Office Visit  Subjective:  Patient ID: Ann Moran, female    DOB: 11-27-1985  Age: 35 y.o. MRN: [id number]  CC:  Chief Complaint  Patient presents with  . Sore Throat   Virtual Visit via Video Note  I connected with Ann Moran on 05/25/20 at  8:30 AM EST by a video enabled telemedicine application and verified that I am speaking with the correct person using two identifiers.  Location: Patient: Home Provider: Primary Care at [facility]   I discussed the limitations of evaluation and management by telemedicine and the availability of in person appointments. The patient expressed understanding and agreed to proceed.  History of Present Illness:  [name] reports that she has been having a sore throat for the past 2 weeks, states that it has been difficult to swallow.  Reports that at the beginning of the 2-week.  She also had a headache, a cough, states those have both resolved.  Reports that she has had intermittent fevers with the last one approximately 3 days ago.  Reports that she did take a home COVID test 5 days ago which was negative.  States that she has been drinking hot tea and using cough drops with some relief.  Does endorse that when she woke up this morning her throat is much improved and she was able to eat without much discomfort.  Reports that daughter has been sick with similar symptoms, also to go home Covid test which was negative.  Reports Two pfizer covid vaccines  - last one 7/21   Observations/Objective: Medical history and current medications reviewed, no physical exam completed     Past Medical History:  Diagnosis Date   . Allergy   . Anemia   . Anxiety   . Chlamydia   . Depression    hx of meds, none currently- doing ok  . Diabetes mellitus without complication (HCC)   . GERD (gastroesophageal reflux disease)   . Infection due to trichomonas   . MRSA (methicillin resistant staph aureus) culture positive    Dec 2012  . Obesity   . Pregnancy induced hypertension   . Urinary tract infection     Past Surgical History:  Procedure Laterality Date  . CHOLECYSTECTOMY    . ECTOPIC PREGNANCY SURGERY      Family History  Problem Relation Age of Onset  . Hypertension Mother   . Diabetes Mother   . Asthma Father   . Hypertension Father   . Stroke Maternal Grandmother   . Diabetes Paternal Grandmother   . Hypertension Paternal Grandmother   . Colon cancer Paternal Grandmother   . Colon cancer Paternal Aunt   . Esophageal cancer Neg Hx   . Stomach cancer Neg Hx   . Rectal cancer Neg Hx     Social History   Socioeconomic History  . Marital status: Single    Spouse name: Not on file  . Number of children: Not on file  . Years of education: Not on file  . Highest education level: Not on file  Occupational History  . Not on file  Tobacco Use  . Smoking status: Current Every Day Smoker    Packs/day: 0.50  Years: 9.00    Pack years: 4.50    Types: Cigarettes  . Smokeless tobacco: Never Used  Vaping Use  . Vaping Use: Former  . Quit date: 08/18/2015  Substance and Sexual Activity  . Alcohol use: Yes    Comment: occasional  . Drug use: No  . Sexual activity: Yes    Birth control/protection: None  Other Topics Concern  . Not on file  Social History Narrative  . Not on file   Social Determinants of Health   Financial Resource Strain: Not on file  Food Insecurity: No Food Insecurity  . Worried About Programme researcher, broadcasting/film/video in the Last Year: Never true  . Ran Out of Food in the Last Year: Never true  Transportation Needs: No Transportation Needs  . Lack of Transportation (Medical): No   . Lack of Transportation (Non-Medical): No  Physical Activity: Not on file  Stress: Not on file  Social Connections: Not on file  Intimate Partner Violence: Not on file    Outpatient Medications Prior to Visit  Medication Sig Dispense Refill  . albuterol (VENTOLIN HFA) 108 (90 Base) MCG/ACT inhaler Inhale 2 puffs into the lungs every 4 (four) hours as needed for wheezing or shortness of breath. 18 g 0  . amLODipine (NORVASC) 10 MG tablet Take 10 mg by mouth daily.    . diclofenac (VOLTAREN) 75 MG EC tablet Take 1 tablet (75 mg total) by mouth 2 (two) times daily. X 10 days then prn pain 60 tablet 0  . fluticasone (FLONASE) 50 MCG/ACT nasal spray Place 1-2 sprays into both nostrils daily. 16 g 0  . glyBURIDE (DIABETA) 5 MG tablet Take 1 tablet (5 mg total) by mouth daily with breakfast. 60 tablet 5  . hydrOXYzine (ATARAX/VISTARIL) 25 MG tablet Take 1 tablet (25 mg total) by mouth every 8 (eight) hours as needed. 60 tablet 1  . methocarbamol (ROBAXIN) 500 MG tablet Take 2 tablets (1,000 mg total) by mouth 3 (three) times daily. X 10 days then prn muscle spasm 90 tablet 0   No facility-administered medications prior to visit.    No Known Allergies  ROS Review of Systems  Constitutional: Negative for chills, fatigue and fever.  HENT: Positive for sore throat. Negative for congestion, sinus pressure, sinus pain and trouble swallowing.   Eyes: Negative.   Respiratory: Negative for cough.   Cardiovascular: Negative.   Gastrointestinal: Negative for abdominal pain, diarrhea, nausea and vomiting.  Endocrine: Negative.   Genitourinary: Negative.   Musculoskeletal: Negative for myalgias.  Skin: Negative.   Allergic/Immunologic: Negative.   Neurological: Negative.   Hematological: Negative.   Psychiatric/Behavioral: Negative.       Objective:     There were no vitals taken for this visit. Wt Readings from Last 3 Encounters:  04/07/20 (!) 301 lb (136.5 kg)  03/26/20 288 lb 12.8 oz  (131 kg)  03/14/20 (!) 313 lb 9.6 oz (142.2 kg)     Health Maintenance Due  Topic Date Due  . COVID-19 Vaccine (2 - Pfizer 3-dose series) 10/22/2019    There are no preventive care reminders to display for this patient.  Lab Results  Component Value Date   TSH 1.930 09/24/2019   Lab Results  Component Value Date   WBC 5.8 11/11/2019   HGB 13.2 11/11/2019   HCT 41.6 11/11/2019   MCV 92.7 11/11/2019   PLT 289 11/11/2019   Lab Results  Component Value Date   NA 141 11/11/2019   K 4.1 11/11/2019  CO2 29 11/11/2019   GLUCOSE 91 11/11/2019   BUN <5 (L) 11/11/2019   CREATININE 0.74 11/11/2019   BILITOT 0.6 11/11/2019   ALKPHOS 57 11/11/2019   AST 14 (L) 11/11/2019   ALT 19 11/11/2019   PROT 7.2 11/11/2019   ALBUMIN 4.0 11/11/2019   CALCIUM 9.0 11/11/2019   ANIONGAP 9 11/11/2019   No results found for: CHOL No results found for: HDL No results found for: LDLCALC No results found for: TRIG No results found for: CHOLHDL Lab Results  Component Value Date   HGBA1C 5.7 (H) 09/24/2019      Assessment & Plan:   Problem List Items Addressed This Visit   None   Visit Diagnoses    Sore throat    -  Primary     Assessment and Plan: 1. Sore throat Symptoms are resolving well, patient encouraged to continue over-the-counter symptomatic treatment as needed, red flags given for prompt reevaluation  Patient encouraged to complete Pfizer COVID booster shot   Follow Up Instructions:    I discussed the assessment and treatment plan with the patient. The patient was provided an opportunity to ask questions and all were answered. The patient agreed with the plan and demonstrated an understanding of the instructions.   The patient was advised to call back or seek an in-person evaluation if the symptoms worsen or if the condition fails to improve as anticipated.    No orders of the defined types were placed in this encounter.   Follow-up: Return if symptoms worsen or  fail to improve.    Kasandra Knudsen Taha Dimond, PA-C

## 2020-05-26 ENCOUNTER — Encounter: Payer: Self-pay | Admitting: Physician Assistant

## 2020-05-27 ENCOUNTER — Ambulatory Visit: Payer: Medicaid Other

## 2020-05-28 ENCOUNTER — Other Ambulatory Visit: Payer: Self-pay | Admitting: Physician Assistant

## 2020-05-28 DIAGNOSIS — J029 Acute pharyngitis, unspecified: Secondary | ICD-10-CM

## 2020-05-28 MED ORDER — AMOXICILLIN 500 MG PO CAPS: 500.0000 mg | ORAL_CAPSULE | Freq: Three times a day (TID) | ORAL | 0 refills | Status: AC

## 2020-06-03 ENCOUNTER — Other Ambulatory Visit: Payer: Self-pay

## 2020-06-03 ENCOUNTER — Ambulatory Visit: Payer: Medicaid Other | Attending: Family Medicine

## 2020-06-03 DIAGNOSIS — M25511 Pain in right shoulder: Secondary | ICD-10-CM | POA: Insufficient documentation

## 2020-06-03 DIAGNOSIS — M542 Cervicalgia: Secondary | ICD-10-CM

## 2020-06-03 DIAGNOSIS — M62830 Muscle spasm of back: Secondary | ICD-10-CM | POA: Diagnosis present
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
## 2020-06-03 NOTE — Patient Instructions (Signed)
Access Code: [facility] URL: [email or web address] Date: 06/03/2020 Prepared by: Gardiner Rhyme  Exercises Open Books - 2 x daily - 7 x weekly - 2 sets - 10 reps Supine Cervical Retraction with Towel - 2 x daily - 7 x weekly - 1-2 sets - 10 reps - 5 seconds hold Standing Median Nerve Glide - 3 x daily - 7 x weekly - 1-2 sets - 10 reps Seated Thoracic Extension with Pectoralis Stretch - 3 x daily - 7 x weekly - 2 sets - 10 reps

## 2020-06-07 ENCOUNTER — Telehealth: Payer: Self-pay

## 2020-06-07 ENCOUNTER — Ambulatory Visit: Payer: Medicaid Other

## 2020-06-07 NOTE — Telephone Encounter (Signed)
Called pt and LM VM at 4:18pm regarding no show today for 3:45pm appt. Reviewed attendance policy and reminded of next appt 2/25.   Bettey Mare. Corliss Marcus, PT, DPT

## 2020-06-17 ENCOUNTER — Ambulatory Visit: Payer: Medicaid Other | Admitting: Physical Therapy

## 2020-06-23 ENCOUNTER — Ambulatory Visit: Payer: Medicaid Other | Attending: Family Medicine | Admitting: Physical Therapy

## 2020-06-23 ENCOUNTER — Other Ambulatory Visit: Payer: Self-pay

## 2020-06-23 ENCOUNTER — Encounter: Payer: Self-pay | Admitting: Physical Therapy

## 2020-06-23 DIAGNOSIS — G8929 Other chronic pain: Secondary | ICD-10-CM | POA: Insufficient documentation

## 2020-06-23 DIAGNOSIS — M542 Cervicalgia: Secondary | ICD-10-CM | POA: Diagnosis present

## 2020-06-23 DIAGNOSIS — M25511 Pain in right shoulder: Secondary | ICD-10-CM | POA: Insufficient documentation

## 2020-06-23 DIAGNOSIS — M4004 Postural kyphosis, thoracic region: Secondary | ICD-10-CM | POA: Diagnosis not present

## 2020-06-23 DIAGNOSIS — M545 Low back pain, unspecified: Secondary | ICD-10-CM | POA: Insufficient documentation

## 2020-06-23 DIAGNOSIS — M6281 Muscle weakness (generalized): Secondary | ICD-10-CM | POA: Diagnosis not present

## 2020-06-23 DIAGNOSIS — M62830 Muscle spasm of back: Secondary | ICD-10-CM | POA: Diagnosis present
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
## 2020-06-23 NOTE — Patient Instructions (Addendum)
Over Head Pull: Narrow Grip       On back, knees bent, feet flat, band across thighs, elbows straight but relaxed. Pull hands apart (start). Keeping elbows straight, bring arms up and over head, hands toward floor. Keep pull steady on band. Hold momentarily. Return slowly, keeping pull steady, back to start. Repeat __15_ times. Band color ___Y___   Side Pull: Double Arm   On back, knees bent, feet flat. Arms perpendicular to body, shoulder level, elbows straight but relaxed. Pull arms out to sides, elbows straight. Resistance band comes across collarbones, hands toward floor. Hold momentarily. Slowly return to starting position. Repeat 15___ times. Band color _Y____   Sash   On back, knees bent, feet flat, left hand on left hip, right hand above left. Pull right arm DIAGONALLY (hip to shoulder) across chest. Bring right arm along head toward floor. Hold momentarily. Slowly return to starting position. Repeat _15__ times. Do with left arm. Band color __Y____   Shoulder Rotation: Double Arm   On back, knees bent, feet flat, elbows tucked at sides, bent 90, hands palms up. Pull hands apart and down toward floor, keeping elbows near sides. Hold momentarily. Slowly return to starting position. Repeat __15_ times. Band color ____Y   Access Code: [facility] URL: [email or web address] Date: 06/23/2020 Prepared by: Jannette Spanner  Exercises Open Books - 2 x daily - 7 x weekly - 2 sets - 10 reps Supine Cervical Retraction with Towel - 2 x daily - 7 x weekly - 1-2 sets - 10 reps - 5 seconds hold Standing Median Nerve Glide - 3 x daily - 7 x weekly - 1-2 sets - 10 reps Seated Thoracic Extension with Pectoralis Stretch - 3 x daily - 7 x weekly - 2 sets - 10 reps Gentle Levator Scapulae Stretch - 1 x daily - 7 x weekly - 1 sets - 3 reps - 10 hold Seated Upper Trapezius Stretch - 1 x daily - 7 x weekly - 1 sets - 3 reps - 10 hold

## 2020-06-28 ENCOUNTER — Encounter: Payer: Self-pay | Admitting: Physical Therapy

## 2020-06-28 ENCOUNTER — Ambulatory Visit: Payer: Medicaid Other | Admitting: Physical Therapy

## 2020-06-28 ENCOUNTER — Other Ambulatory Visit: Payer: Self-pay

## 2020-06-28 DIAGNOSIS — M25511 Pain in right shoulder: Secondary | ICD-10-CM

## 2020-06-28 DIAGNOSIS — M542 Cervicalgia: Secondary | ICD-10-CM | POA: Diagnosis not present

## 2020-06-28 DIAGNOSIS — M62830 Muscle spasm of back: Secondary | ICD-10-CM
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
## 2020-06-30 ENCOUNTER — Ambulatory Visit: Payer: Medicaid Other | Admitting: Physical Therapy

## 2020-07-04 ENCOUNTER — Encounter: Payer: Self-pay | Admitting: Family Medicine

## 2020-07-04 ENCOUNTER — Other Ambulatory Visit: Payer: Self-pay | Admitting: Family Medicine

## 2020-07-04 ENCOUNTER — Encounter: Payer: Self-pay | Admitting: Physician Assistant

## 2020-07-04 MED ORDER — CETIRIZINE HCL 10 MG PO TABS
10.0000 mg | ORAL_TABLET | Freq: Every day | ORAL | 0 refills | Status: DC
Start: 2020-07-04 — End: 2020-07-05

## 2020-07-04 MED ORDER — FLUTICASONE PROPIONATE 50 MCG/ACT NA SUSP
1.0000 | Freq: Every day | NASAL | 0 refills | Status: DC
Start: 2020-07-04 — End: 2020-08-07

## 2020-07-05 ENCOUNTER — Ambulatory Visit: Payer: Medicaid Other

## 2020-07-05 ENCOUNTER — Other Ambulatory Visit: Payer: Self-pay

## 2020-07-05 ENCOUNTER — Other Ambulatory Visit: Payer: Self-pay | Admitting: Family Medicine

## 2020-07-05 DIAGNOSIS — M4004 Postural kyphosis, thoracic region: Secondary | ICD-10-CM

## 2020-07-05 DIAGNOSIS — M25511 Pain in right shoulder: Secondary | ICD-10-CM

## 2020-07-05 DIAGNOSIS — M6281 Muscle weakness (generalized): Secondary | ICD-10-CM

## 2020-07-05 DIAGNOSIS — M542 Cervicalgia: Secondary | ICD-10-CM

## 2020-07-05 DIAGNOSIS — G8929 Other chronic pain: Secondary | ICD-10-CM

## 2020-07-05 DIAGNOSIS — M62830 Muscle spasm of back: Secondary | ICD-10-CM

## 2020-07-05 DIAGNOSIS — M545 Other chronic pain: Secondary | ICD-10-CM

## 2020-07-05 MED ORDER — ALBUTEROL SULFATE HFA 108 (90 BASE) MCG/ACT IN AERS: 2.0000 | INHALATION_SPRAY | RESPIRATORY_TRACT | 0 refills | Status: DC | PRN
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
## 2020-07-07 ENCOUNTER — Ambulatory Visit: Payer: Medicaid Other

## 2020-07-12 ENCOUNTER — Ambulatory Visit: Payer: Medicaid Other | Admitting: Physical Therapy

## 2020-07-14 ENCOUNTER — Ambulatory Visit: Payer: Medicaid Other

## 2020-07-14 ENCOUNTER — Telehealth: Payer: Self-pay

## 2020-07-14 NOTE — Telephone Encounter (Signed)
Called pt after missing 8am appt. Left message informing pt of attendance policy and that this was the 2nd no show. Informed pt if she wanted to continue to come in, she could schedule one at a time, but if she missed an appointment or canceled day of, she would be discharged.  Ann Moran. Corliss Marcus, PT, DPT

## 2020-07-19 NOTE — Telephone Encounter (Signed)
LVM for patient to call back and schedule for next available appt with either Dr. Bjorn Pippin or an APP.

## 2020-07-20 ENCOUNTER — Other Ambulatory Visit: Payer: Self-pay

## 2020-07-20 ENCOUNTER — Ambulatory Visit
Admission: RE | Admit: 2020-07-20 | Discharge: 2020-07-20 | Discharge status: Against Medical Advice | Payer: Medicaid Other | Source: Ambulatory Visit | Attending: Emergency Medicine | Admitting: Emergency Medicine

## 2020-07-20 ENCOUNTER — Ambulatory Visit: Payer: Medicaid Other

## 2020-07-20 VITALS — BP 159/96 | HR 112 | Temp 98.3°F | Resp 18

## 2020-07-20 DIAGNOSIS — J22 Unspecified acute lower respiratory infection: Secondary | ICD-10-CM

## 2020-07-20 DIAGNOSIS — K219 Gastro-esophageal reflux disease without esophagitis: Secondary | ICD-10-CM

## 2020-07-20 MED ORDER — PREDNISONE 20 MG PO TABS: 40.0000 mg | ORAL_TABLET | Freq: Every day | ORAL | 0 refills | Status: AC

## 2020-07-20 MED ORDER — OMEPRAZOLE 20 MG PO CPDR: 20.0000 mg | DELAYED_RELEASE_CAPSULE | Freq: Every day | ORAL | 0 refills | Status: DC

## 2020-07-20 MED ORDER — ALUM & MAG HYDROXIDE-SIMETH 200-200-20 MG/5ML PO SUSP
30.0000 mL | Freq: Once | ORAL | Status: AC
  Administered 2020-07-20: 30 mL via ORAL

## 2020-07-20 MED ORDER — AMOXICILLIN-POT CLAVULANATE 875-125 MG PO TABS: 1.0000 | ORAL_TABLET | Freq: Two times a day (BID) | ORAL | 0 refills | Status: DC

## 2020-07-20 MED ORDER — LIDOCAINE VISCOUS HCL 2 % MT SOLN
15.0000 mL | Freq: Once | OROMUCOSAL | Status: AC
  Administered 2020-07-20: 15 mL via ORAL

## 2020-07-20 NOTE — ED Triage Notes (Signed)
Pt c/o productive cough for over a month. C/o chest congestion/tightness x2 days. Pt c/o pain from rt elbow to rt hand x2 days.

## 2020-07-20 NOTE — Discharge Instructions (Addendum)
I have started antibiotics and prednisone in hopes of helping your symptoms.  Omeprazole as well to help with the pain. I do feel an xray is still warranted so return as able, particularly if worsening.

## 2020-07-20 NOTE — ED Provider Notes (Signed)
[facility]    CSN: [id number] Arrival date & time: 07/20/20  1514      History   Chief Complaint Chief Complaint  Patient presents with  . appt 3  . Cough    HPI Ann Moran is a 35 y.o. female.   Ann Moran presents with complaints of cough for the past month. Has been treated for bronchitis with inhaler and tessalon in the past which has helped some. Woke three nights ago with chest pain as well as right arm pain. Pain radiates to back. Chest pain with deep breathing. Pain with palpation of the chest. No abdominal pain. History of allergies and taking medication for these as well. She does smoke. No history of blood clots.    ROS per HPI, negative if not otherwise mentioned.      Past Medical History:  Diagnosis Date  . Allergy   . Anemia   . Anxiety   . Chlamydia   . Depression    hx of meds, none currently- doing ok  . Diabetes mellitus without complication (HCC)   . GERD (gastroesophageal reflux disease)   . Infection due to trichomonas   . MRSA (methicillin resistant staph aureus) culture positive    Dec 2012  . Obesity   . Pregnancy induced hypertension   . Urinary tract infection     Patient Active Problem List   Diagnosis Date Noted  . Dysphagia 05/20/2019  . Encounter for other preprocedural examination 05/20/2019  . PCOS (polycystic ovarian syndrome) 02/24/2019  . Neck pain 02/24/2019  . Morbid obesity with body mass index (BMI) of 45.0 to 49.9 in adult [facility]) 02/24/2019  . Essential hypertension 02/24/2019  . Gastroesophageal reflux disease 02/24/2019  . Irregular menstrual cycle 01/14/2019  . Abnormal uterine bleeding 01/14/2019  . Chronic cough 01/14/2019    Past Surgical History:  Procedure Laterality Date  . CHOLECYSTECTOMY    . ECTOPIC PREGNANCY SURGERY      OB History    Gravida  2   Para  1   Term  0   Preterm  1   AB  1   Living  1     SAB      IAB      Ectopic  1   Multiple      Live  Births  1            Home Medications    Prior to Admission medications   Medication Sig Start Date End Date Taking? Authorizing Provider  amoxicillin-clavulanate (AUGMENTIN) 875-125 MG tablet Take 1 tablet by mouth every 12 (twelve) hours. 07/20/20  Yes Linus Mako B, NP  omeprazole (PRILOSEC) 20 MG capsule Take 1 capsule (20 mg total) by mouth daily. 07/20/20  Yes Assad Harbeson, Dorene Grebe B, NP  predniSONE (DELTASONE) 20 MG tablet Take 2 tablets (40 mg total) by mouth daily with breakfast for 5 days. 07/20/20 07/25/20 Yes Aryaa Bunting, Barron Alvine, NP  albuterol (VENTOLIN HFA) 108 (90 Base) MCG/ACT inhaler Inhale 2 puffs into the lungs every 4 (four) hours as needed for wheezing or shortness of breath. 07/05/20   Hoy Register, MD  amLODipine (NORVASC) 10 MG tablet Take 10 mg by mouth daily.    [provider]  cetirizine (ZYRTEC) 10 MG tablet TAKE 1 TABLET(10 MG) BY MOUTH DAILY 07/05/20   Hoy Register, MD  fluticasone (FLONASE) 50 MCG/ACT nasal spray Place 1-2 sprays into both nostrils daily. 07/04/20   Hoy Register, MD  glyBURIDE (DIABETA) 5 MG  tablet Take 1 tablet (5 mg total) by mouth daily with breakfast. 03/14/20   Sharyon Cable, CNM  hydrOXYzine (ATARAX/VISTARIL) 25 MG tablet Take 1 tablet (25 mg total) by mouth every 8 (eight) hours as needed. 10/19/19   Hoy Register, MD  citalopram (CELEXA) 20 MG tablet Take 1 tablet (20 mg total) by mouth daily. Patient not taking: Reported on 03/14/2020 10/19/19 03/23/20  Hoy Register, MD  medroxyPROGESTERone (PROVERA) 5 MG tablet Take 2 tablets (10 mg total) by mouth daily. Patient not taking: Reported on 07/20/2018 06/30/18 12/06/18  Geoffery Lyons, MD    Family History Family History  Problem Relation Age of Onset  . Hypertension Mother   . Diabetes Mother   . Asthma Father   . Hypertension Father   . Stroke Maternal Grandmother   . Diabetes Paternal Grandmother   . Hypertension Paternal Grandmother   . Colon cancer Paternal  Grandmother   . Colon cancer Paternal Aunt   . Esophageal cancer Neg Hx   . Stomach cancer Neg Hx   . Rectal cancer Neg Hx     Social History Social History   Tobacco Use  . Smoking status: Current Every Day Smoker    Packs/day: 0.50    Years: 9.00    Pack years: 4.50    Types: Cigarettes  . Smokeless tobacco: Never Used  Vaping Use  . Vaping Use: Former  . Quit date: 08/18/2015  Substance Use Topics  . Alcohol use: Yes    Comment: occasional  . Drug use: No     Allergies   Patient has no known allergies.   Review of Systems Review of Systems   Physical Exam Triage Vital Signs ED Triage Vitals [07/20/20 1533]  Enc Vitals Group     BP (!) 159/96     Pulse Rate (!) 112     Resp 18     Temp 98.3 F (36.8 C)     Temp Source Oral     SpO2 92 %     Weight      Height      Head Circumference      Peak Flow      Pain Score 5     Pain Loc      Pain Edu?      Excl. in GC?    No data found.  Updated Vital Signs BP (!) 159/96 (BP Location: Left Arm)   Pulse (!) 112   Temp 98.3 F (36.8 C) (Oral)   Resp 18   SpO2 92%    Physical Exam Constitutional:      General: She is not in acute distress.    Appearance: She is well-developed.  HENT:     Right Ear: Tympanic membrane and ear canal normal.     Left Ear: Tympanic membrane and ear canal normal.  Cardiovascular:     Rate and Rhythm: Tachycardia present.  Pulmonary:     Effort: Pulmonary effort is normal.     Breath sounds: Decreased breath sounds present. No wheezing.     Comments: Occasional dry cough noted  Chest:     Chest wall: Tenderness present.    Skin:    General: Skin is warm and dry.  Neurological:     Mental Status: She is alert and oriented to person, place, and time.      UC Treatments / Results  Labs (all labs ordered are listed, but only abnormal results are displayed) Labs Reviewed - No data to display  EKG   Radiology No results found.  Procedures Procedures  (including critical care time)  Medications Ordered in UC Medications  alum & mag hydroxide-simeth (MAALOX/MYLANTA) 200-200-20 MG/5ML suspension 30 mL (30 mLs Oral Given 07/20/20 1606)    And  lidocaine (XYLOCAINE) 2 % viscous mouth solution 15 mL (15 mLs Oral Given 07/20/20 1607)    Initial Impression / Assessment and Plan / UC Course  I have reviewed the triage vital signs and the nursing notes.  Pertinent labs & imaging results that were available during my care of the patient were reviewed by me and considered in my medical decision making (see chart for details).     O2 only at 92%; tachycardic as well. Unfortunately patient had to leave prior to completing chest xray. States her chest discomfort has significantly improved s/p gi cocktail here in clinic. Pneumonia vs bronchitis vs gerd vs pe considered given vitals and symptoms. Patient states she will try ot return to complete evaluation. Medications initially sent with return precautions provided. Ambulatory out of clinic without difficulty.   Final Clinical Impressions(s) / UC Diagnoses   Final diagnoses:  Lower respiratory tract infection  Gastroesophageal reflux disease, unspecified whether esophagitis present     Discharge Instructions     I have started antibiotics and prednisone in hopes of helping your symptoms.  Omeprazole as well to help with the pain. I do feel an xray is still warranted so return as able, particularly if worsening.     ED Prescriptions    Medication Sig Dispense Auth. Provider   amoxicillin-clavulanate (AUGMENTIN) 875-125 MG tablet Take 1 tablet by mouth every 12 (twelve) hours. 14 tablet Linus Mako B, NP   predniSONE (DELTASONE) 20 MG tablet Take 2 tablets (40 mg total) by mouth daily with breakfast for 5 days. 10 tablet Linus Mako B, NP   omeprazole (PRILOSEC) 20 MG capsule Take 1 capsule (20 mg total) by mouth daily. 30 capsule Georgetta Haber, NP     PDMP not reviewed this  encounter.   Georgetta Haber, NP 07/20/20 2137

## 2020-07-21 ENCOUNTER — Encounter: Payer: Self-pay | Admitting: Emergency Medicine

## 2020-07-21 ENCOUNTER — Ambulatory Visit
Admission: EM | Admit: 2020-07-21 | Discharge: 2020-07-21 | Disposition: A | Discharge status: Home / Self Care | Payer: Medicaid Other | Attending: Family Medicine | Admitting: Family Medicine

## 2020-07-21 ENCOUNTER — Ambulatory Visit (INDEPENDENT_AMBULATORY_CARE_PROVIDER_SITE_OTHER): Payer: Medicaid Other

## 2020-07-21 ENCOUNTER — Ambulatory Visit: Payer: Self-pay

## 2020-07-21 DIAGNOSIS — R053 Chronic cough: Secondary | ICD-10-CM

## 2020-07-21 DIAGNOSIS — R079 Chest pain, unspecified: Secondary | ICD-10-CM | POA: Diagnosis not present

## 2020-07-21 DIAGNOSIS — R059 Cough, unspecified: Secondary | ICD-10-CM | POA: Diagnosis not present

## 2020-07-21 DIAGNOSIS — I10 Essential (primary) hypertension: Secondary | ICD-10-CM

## 2020-07-21 DIAGNOSIS — M940 Chondrocostal junction syndrome [Tietze]: Secondary | ICD-10-CM

## 2020-07-21 NOTE — Discharge Instructions (Addendum)
Your blood pressure was noted to be elevated during your visit today. If you are currently taking medication for high blood pressure, please ensure you are taking this as directed. If you do not have a history of high blood pressure and your blood pressure remains persistently elevated, you may need to begin taking a medication at some point. You may return here within the next few days to recheck if unable to see your primary care provider or if you do not have a one.  BP (!) 170/107 (BP Location: Left Wrist)   Pulse 94   Temp 98.2 F (36.8 C) (Oral)   Resp 19   SpO2 95%   BP Readings from Last 3 Encounters:  07/21/20 (!) 170/107  07/20/20 (!) 159/96  04/07/20 116/78

## 2020-07-21 NOTE — ED Provider Notes (Signed)
[facility]   [id number] 07/21/20 Arrival Time: 9179  ASSESSMENT & PLAN:  1. Persistent cough for 3 weeks or longer   2. Uncontrolled hypertension   3. Costochondritis    See AVS for d/c information. VSS today. Began taking medications prescribed yesterday.  I have personally viewed the imaging studies ordered this visit. CXR: No acute abnormalities.    Discharge Instructions      Your blood pressure was noted to be elevated during your visit today. If you are currently taking medication for high blood pressure, please ensure you are taking this as directed. If you do not have a history of high blood pressure and your blood pressure remains persistently elevated, you may need to begin taking a medication at some point. You may return here within the next few days to recheck if unable to see your primary care provider or if you do not have a one.  BP (!) 170/107 (BP Location: Left Wrist)   Pulse 94   Temp 98.2 F (36.8 C) (Oral)   Resp 19   SpO2 95%   BP Readings from Last 3 Encounters:  07/21/20 (!) 170/107  07/20/20 (!) 159/96  04/07/20 116/78            Follow-up Information    Schedule an appointment as soon as possible for a visit  with Hoy Register, MD.   Specialty: Family Medicine Why: For recheck and to evaluate your blood pressure. Contact information: [address] [place] [address] [phone]               Reviewed expectations re: course of current medical issues. Questions answered. Outlined signs and symptoms indicating need for more acute intervention. Understanding verbalized. After Visit Summary given.   SUBJECTIVE: History from: patient. Seen here yesterday; note reviewed by me. Had to leave; returning for recheck and CXR. Ann Moran is a 35 y.o. female who reports coughing for past month; inhaler with some relief. CP with deep breaths that improved yesterday after GI cocktail; no SOB reported.  Afebrile.  Social History   Tobacco Use  Smoking Status Current Every Day Smoker  . Packs/day: 0.50  . Years: 9.00  . Pack years: 4.50  . Types: Cigarettes  Smokeless Tobacco Never Used   Increased blood pressure noted today. Reports that she is treated for HTN. She reports not taking medications regularly as instructed, no swelling of ankles, no orthostatic dizziness or lightheadedness, no orthopnea or paroxysmal nocturnal dyspnea and no palpitations.   OBJECTIVE:  Vitals:   07/21/20 0956  BP: (!) 170/107  Pulse: 94  Resp: 19  Temp: 98.2 F (36.8 C)  TempSrc: Oral  SpO2: 95%    General appearance: alert; no distress Eyes: PERRLA; EOMI; conjunctiva normal HENT: [place]; AT; without nasal congestion Neck: supple  Lungs: speaks full sentences without difficulty; unlabored; CTAB Chest Wall: is TTP over mid chest Extremities: no edema Skin: warm and dry Neurologic: normal gait Psychological: alert and cooperative; normal mood and affect   No Known Allergies  Past Medical History:  Diagnosis Date  . Allergy   . Anemia   . Anxiety   . Chlamydia   . Depression    hx of meds, none currently- doing ok  . Diabetes mellitus without complication (HCC)   . GERD (gastroesophageal reflux disease)   . Infection due to trichomonas   . MRSA (methicillin resistant staph aureus) culture positive    Dec 2012  . Obesity   . Pregnancy  induced hypertension   . Urinary tract infection    Social History   Socioeconomic History  . Marital status: Single    Spouse name: Not on file  . Number of children: Not on file  . Years of education: Not on file  . Highest education level: Not on file  Occupational History  . Not on file  Tobacco Use  . Smoking status: Current Every Day Smoker    Packs/day: 0.50    Years: 9.00    Pack years: 4.50    Types: Cigarettes  . Smokeless tobacco: Never Used  Vaping Use  . Vaping Use: Former  . Quit date: 08/18/2015  Substance and Sexual  Activity  . Alcohol use: Yes    Comment: occasional  . Drug use: No  . Sexual activity: Yes    Birth control/protection: None  Other Topics Concern  . Not on file  Social History Narrative  . Not on file   Social Determinants of Health   Financial Resource Strain: Not on file  Food Insecurity: No Food Insecurity  . Worried About Programme researcher, broadcasting/film/video in the Last Year: Never true  . Ran Out of Food in the Last Year: Never true  Transportation Needs: No Transportation Needs  . Lack of Transportation (Medical): No  . Lack of Transportation (Non-Medical): No  Physical Activity: Not on file  Stress: Not on file  Social Connections: Not on file  Intimate Partner Violence: Not on file   Family History  Problem Relation Age of Onset  . Hypertension Mother   . Diabetes Mother   . Asthma Father   . Hypertension Father   . Stroke Maternal Grandmother   . Diabetes Paternal Grandmother   . Hypertension Paternal Grandmother   . Colon cancer Paternal Grandmother   . Colon cancer Paternal Aunt   . Esophageal cancer Neg Hx   . Stomach cancer Neg Hx   . Rectal cancer Neg Hx    Past Surgical History:  Procedure Laterality Date  . CHOLECYSTECTOMY    . ECTOPIC PREGNANCY SURGERY       Mardella Layman, MD 07/21/20 1126

## 2020-07-21 NOTE — Progress Notes (Deleted)
Cardiology Office Note:    Date:  07/21/2020   ID:  Ann Moran, DOB 1985/09/18, MRN [id number]  PCP:  Ann Register, MD  Cardiologist:  No primary care provider on file.  Electrophysiologist:  None   Referring MD: Ann Register, MD   No chief complaint on file.   History of Present Illness:    Ann Moran is a 35 y.o. female with a hx of prediabetes, hypertension, obesity, anxiety/depression, tobacco use who presents for follow-up.  She was referred by Ann Co, PA for evaluation of syncope and family history of aneurysms, initially seen on 11/03/2019.  She reports that she has had 4 episodes of cough induced syncope over last year.  Reports last episode happened 1 month ago.  She was sitting on her couch and began having a coughing spell and started to urinate.  She stood up to go to the bathroom and passed out.  Reports other 3 times where she passed out occurred while sitting after having coughing spell.  States she has frequent coughing spells that usually occur after she smokes.  She does not exercise, reports most exercise she does is walking around her house.  She denies any chest pain or dyspnea.  Occasionally feels like her heart is racing, typically occurs when she is upset.  She denies any lower extremity edema.  She denies any lightheadedness outside of her syncopal episodes.  Reports that she has been told that she snores and stops breathing while she sleeps.  She stopped taking all her BP medications 2 weeks ago.  Has not been checking BP at home.  Smokes 0.5 pack/day.  Father had MI in 68s.  Mother had cerebral aneurysm in 30s  At initial clinic visit on 11/03/2019, echocardiogram was ordered but was not done.  Zio patch x11 days showed no significant arrhythmias.  Since last clinic visit,    Past Medical History:  Diagnosis Date  . Allergy   . Anemia   . Anxiety   . Chlamydia   . Depression    hx of meds, none currently- doing ok  . Diabetes mellitus  without complication (HCC)   . GERD (gastroesophageal reflux disease)   . Infection due to trichomonas   . MRSA (methicillin resistant staph aureus) culture positive    Dec 2012  . Obesity   . Pregnancy induced hypertension   . Urinary tract infection     Past Surgical History:  Procedure Laterality Date  . CHOLECYSTECTOMY    . ECTOPIC PREGNANCY SURGERY      Current Medications: No outpatient medications have been marked as taking for the 07/22/20 encounter (Appointment) with Little Ishikawa, MD.     Allergies:   Patient has no known allergies.   Social History   Socioeconomic History  . Marital status: Single    Spouse name: Not on file  . Number of children: Not on file  . Years of education: Not on file  . Highest education level: Not on file  Occupational History  . Not on file  Tobacco Use  . Smoking status: Current Every Day Smoker    Packs/day: 0.50    Years: 9.00    Pack years: 4.50    Types: Cigarettes  . Smokeless tobacco: Never Used  Vaping Use  . Vaping Use: Former  . Quit date: 08/18/2015  Substance and Sexual Activity  . Alcohol use: Yes    Comment: occasional  . Drug use: No  . Sexual activity: Yes  Birth control/protection: None  Other Topics Concern  . Not on file  Social History Narrative  . Not on file   Social Determinants of Health   Financial Resource Strain: Not on file  Food Insecurity: No Food Insecurity  . Worried About Programme researcher, broadcasting/film/video in the Last Year: Never true  . Ran Out of Food in the Last Year: Never true  Transportation Needs: No Transportation Needs  . Lack of Transportation (Medical): No  . Lack of Transportation (Non-Medical): No  Physical Activity: Not on file  Stress: Not on file  Social Connections: Not on file     Family History: The patient's family history includes Asthma in her father; Colon cancer in her paternal aunt and paternal grandmother; Diabetes in her mother and paternal grandmother;  Hypertension in her father, mother, and paternal grandmother; Stroke in her maternal grandmother. There is no history of Esophageal cancer, Stomach cancer, or Rectal cancer.  ROS:   Please see the history of present illness.     All other systems reviewed and are negative.  EKGs/Labs/Other Studies Reviewed:    The following studies were reviewed today:   EKG:  EKG is ordered today.  The ekg ordered today demonstrates sinus tachycardia, rate 105, QTc 473 T wave inversion in lead III  Recent Labs: 09/24/2019: TSH 1.930 11/11/2019: ALT 19; BUN <5; Creatinine, Ser 0.74; Hemoglobin 13.2; Platelets 289; Potassium 4.1; Sodium 141  Recent Lipid Panel No results found for: CHOL, TRIG, HDL, CHOLHDL, VLDL, LDLCALC, LDLDIRECT  Physical Exam:    VS:  There were no vitals taken for this visit.    Wt Readings from Last 3 Encounters:  04/07/20 (!) 301 lb (136.5 kg)  03/26/20 288 lb 12.8 oz (131 kg)  03/14/20 (!) 313 lb 9.6 oz (142.2 kg)     GEN:   in no acute distress HEENT: Normal NECK: No JVD; No carotid bruits LYMPHATICS: No lymphadenopathy CARDIAC: regular, tachycardic, no murmurs, rubs, gallops RESPIRATORY:  Clear to auscultation without rales, wheezing or rhonchi  ABDOMEN: Soft, non-tender, non-distended MUSCULOSKELETAL:  No edema; No deformity  SKIN: Warm and dry NEUROLOGIC:  Alert and oriented x 3 PSYCHIATRIC:  Normal affect   ASSESSMENT:    No diagnosis found. PLAN:    Syncope: Description suggestive of vasovagal syncope triggered by coughing.  At initial clinic visit on 11/03/2019, echocardiogram was ordered but was not done.  Zio patch x11 days showed no significant arrhythmias.  Family history of aneurysms: Significant family history of cerebral aneurysms, reports mother, aunt, and 2 cousins had cerebral aneurysms.  Recommend screening with MRA head.  Hypertension: On amlodipine 10 mg daily  Suspected OSA: Reports she has been told she snores and stops breathing while  sleeping.  Will order sleep study.  Prediabetes: On glyburide  Tobacco use: Patient counseled on risk of tobacco use and cessation strongly encouraged  RTC in*** Medication Adjustments/Labs and Tests Ordered: Current medicines are reviewed at length with the patient today.  Concerns regarding medicines are outlined above.  No orders of the defined types were placed in this encounter.  No orders of the defined types were placed in this encounter.   There are no Patient Instructions on file for this visit.   Signed, Little Ishikawa, MD  07/21/2020 11:56 PM    [facility]

## 2020-07-21 NOTE — ED Triage Notes (Signed)
Pt presents for Chest x-ray after yesterdays visit.

## 2020-07-22 ENCOUNTER — Other Ambulatory Visit: Payer: Self-pay

## 2020-07-22 ENCOUNTER — Ambulatory Visit (INDEPENDENT_AMBULATORY_CARE_PROVIDER_SITE_OTHER): Payer: Medicaid Other | Admitting: Cardiology

## 2020-07-22 ENCOUNTER — Encounter: Payer: Self-pay | Admitting: Cardiology

## 2020-07-22 VITALS — BP 134/100 | HR 89 | Ht 69.0 in | Wt 327.0 lb

## 2020-07-22 DIAGNOSIS — Z720 Tobacco use: Secondary | ICD-10-CM

## 2020-07-22 DIAGNOSIS — R55 Syncope and collapse: Secondary | ICD-10-CM | POA: Diagnosis not present

## 2020-07-22 DIAGNOSIS — Z8249 Family history of ischemic heart disease and other diseases of the circulatory system: Secondary | ICD-10-CM

## 2020-07-22 DIAGNOSIS — R0683 Snoring: Secondary | ICD-10-CM | POA: Diagnosis not present

## 2020-07-22 DIAGNOSIS — I10 Essential (primary) hypertension: Secondary | ICD-10-CM | POA: Diagnosis not present

## 2020-07-22 NOTE — Patient Instructions (Signed)
Medication Instructions:  Continue same medications *If you need a refill on your cardiac medications before your next appointment, please call your pharmacy*   Lab Work: None ordered   Testing/Procedures: Echo  Sleep Study  MRA Brain     Follow-Up: At [facility], you and your health needs are our priority.  As part of our continuing mission to provide you with exceptional heart care, we have created designated Provider Care Teams.  These Care Teams include your primary Cardiologist (physician) and Advanced Practice Providers (APPs -  Physician Assistants and Nurse Practitioners) who all work together to provide you with the care you need, when you need it.  We recommend signing up for the patient portal called "MyChart".  Sign up information is provided on this After Visit Summary.  MyChart is used to connect with patients for Virtual Visits (Telemedicine).  Patients are able to view lab/test results, encounter notes, upcoming appointments, etc.  Non-urgent messages can be sent to your provider as well.   To learn more about what you can do with MyChart, go to [email or web address].    Your next appointment:  6 months    Call in July to schedule Oct appointment    The format for your next appointment:  Office   Provider:  Dr.Schumann  Check Blood Pressure twice a day for 1 week send reading to [facility] Guide will be contacting you smoking sensation
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
## 2020-07-25 ENCOUNTER — Telehealth: Payer: Self-pay | Admitting: Cardiology

## 2020-07-25 NOTE — Telephone Encounter (Signed)
Left message for patient to call and schedule the Echocardiogram and MRA brain ordered by Dr. Bjorn Pippin

## 2020-07-26 ENCOUNTER — Encounter: Payer: Self-pay | Admitting: Family Medicine

## 2020-07-26 ENCOUNTER — Other Ambulatory Visit: Payer: Self-pay | Admitting: Family Medicine

## 2020-07-26 MED ORDER — AMOXICILLIN 500 MG PO CAPS: 500.0000 mg | ORAL_CAPSULE | Freq: Three times a day (TID) | ORAL | 0 refills | Status: DC

## 2020-07-26 NOTE — Telephone Encounter (Signed)
Spoke with patent regarding the Friday 08/26/20 8:35 am Echocardiogram appointment at [address] and the Thursday 08/04/20 4:00pm MRA brain at [facility]--arrival time is 3:45pm --1st floor admissions office.  Will mail information to patient and she voiced her understanding.

## 2020-07-27 ENCOUNTER — Other Ambulatory Visit: Payer: Self-pay | Admitting: Family Medicine

## 2020-07-27 MED ORDER — FLUCONAZOLE 150 MG PO TABS: 150.0000 mg | ORAL_TABLET | Freq: Once | ORAL | 0 refills | Status: AC

## 2020-07-31 ENCOUNTER — Ambulatory Visit
Admission: RE | Admit: 2020-07-31 | Discharge: 2020-07-31 | Disposition: A | Discharge status: Home / Self Care | Payer: Medicaid Other | Source: Ambulatory Visit | Attending: Emergency Medicine | Admitting: Emergency Medicine

## 2020-07-31 ENCOUNTER — Ambulatory Visit (INDEPENDENT_AMBULATORY_CARE_PROVIDER_SITE_OTHER): Payer: Medicaid Other

## 2020-07-31 ENCOUNTER — Other Ambulatory Visit: Payer: Self-pay

## 2020-07-31 VITALS — BP 142/94 | HR 96 | Temp 98.0°F | Resp 18

## 2020-07-31 DIAGNOSIS — S8265XA Nondisplaced fracture of lateral malleolus of left fibula, initial encounter for closed fracture: Secondary | ICD-10-CM

## 2020-07-31 DIAGNOSIS — M25572 Pain in left ankle and joints of left foot: Secondary | ICD-10-CM

## 2020-07-31 MED ORDER — IBUPROFEN 800 MG PO TABS: 800.0000 mg | ORAL_TABLET | Freq: Three times a day (TID) | ORAL | 0 refills | Status: DC

## 2020-07-31 NOTE — ED Triage Notes (Signed)
Pt presents with left ankle pain from turning ankle getting out of car

## 2020-07-31 NOTE — ED Provider Notes (Signed)
[facility]    CSN: [id number] Arrival date & time: 07/31/20  1349      History   Chief Complaint Chief Complaint  Patient presents with  . Ankle Pain    HPI Ann Moran is a 35 y.o. female presenting today for evaluation of ankle injury.  Rolled ankle getting out of car yesterday.  Since has had pain and tightness to her ankle.  Denies history of prior fracture, but does report previously spraining ankle on the side.  Denies numbness or tingling.   HPI  Past Medical History:  Diagnosis Date  . Allergy   . Anemia   . Anxiety   . Chlamydia   . Depression    hx of meds, none currently- doing ok  . Diabetes mellitus without complication (HCC)   . GERD (gastroesophageal reflux disease)   . Infection due to trichomonas   . MRSA (methicillin resistant staph aureus) culture positive    Dec 2012  . Obesity   . Pregnancy induced hypertension   . Urinary tract infection     Patient Active Problem List   Diagnosis Date Noted  . Dysphagia 05/20/2019  . Encounter for other preprocedural examination 05/20/2019  . PCOS (polycystic ovarian syndrome) 02/24/2019  . Neck pain 02/24/2019  . Morbid obesity with body mass index (BMI) of 45.0 to 49.9 in adult [facility]) 02/24/2019  . Essential hypertension 02/24/2019  . Gastroesophageal reflux disease 02/24/2019  . Irregular menstrual cycle 01/14/2019  . Abnormal uterine bleeding 01/14/2019  . Chronic cough 01/14/2019    Past Surgical History:  Procedure Laterality Date  . CHOLECYSTECTOMY    . ECTOPIC PREGNANCY SURGERY      OB History    Gravida  2   Para  1   Term  0   Preterm  1   AB  1   Living  1     SAB      IAB      Ectopic  1   Multiple      Live Births  1            Home Medications    Prior to Admission medications   Medication Sig Start Date End Date Taking? Authorizing Provider  ibuprofen (ADVIL) 800 MG tablet Take 1 tablet (800 mg total) by mouth 3 (three) times daily.  07/31/20  Yes Luma Clopper C, PA-C  albuterol (VENTOLIN HFA) 108 (90 Base) MCG/ACT inhaler Inhale 2 puffs into the lungs every 4 (four) hours as needed for wheezing or shortness of breath. 07/05/20   Hoy Register, MD  amLODipine (NORVASC) 10 MG tablet Take 10 mg by mouth daily.    [provider]  amoxicillin (AMOXIL) 500 MG capsule Take 1 capsule (500 mg total) by mouth 3 (three) times daily. 07/26/20   Hoy Register, MD  cetirizine (ZYRTEC) 10 MG tablet TAKE 1 TABLET(10 MG) BY MOUTH DAILY 07/05/20   Hoy Register, MD  fluticasone (FLONASE) 50 MCG/ACT nasal spray Place 1-2 sprays into both nostrils daily. 07/04/20   Hoy Register, MD  glyBURIDE (DIABETA) 5 MG tablet Take 1 tablet (5 mg total) by mouth daily with breakfast. 03/14/20   Sharyon Cable, CNM  hydrOXYzine (ATARAX/VISTARIL) 25 MG tablet Take 1 tablet (25 mg total) by mouth every 8 (eight) hours as needed. 10/19/19   Hoy Register, MD  omeprazole (PRILOSEC) 20 MG capsule Take 1 capsule (20 mg total) by mouth daily. 07/20/20   Georgetta Haber, NP  citalopram (CELEXA) 20  MG tablet Take 1 tablet (20 mg total) by mouth daily. Patient not taking: Reported on 03/14/2020 10/19/19 03/23/20  Hoy Register, MD  medroxyPROGESTERone (PROVERA) 5 MG tablet Take 2 tablets (10 mg total) by mouth daily. Patient not taking: Reported on 07/20/2018 06/30/18 12/06/18  Geoffery Lyons, MD    Family History Family History  Problem Relation Age of Onset  . Hypertension Mother   . Diabetes Mother   . Asthma Father   . Hypertension Father   . Stroke Maternal Grandmother   . Diabetes Paternal Grandmother   . Hypertension Paternal Grandmother   . Colon cancer Paternal Grandmother   . Colon cancer Paternal Aunt   . Esophageal cancer Neg Hx   . Stomach cancer Neg Hx   . Rectal cancer Neg Hx     Social History Social History   Tobacco Use  . Smoking status: Current Every Day Smoker    Packs/day: 0.50    Years: 9.00    Pack years: 4.50     Types: Cigarettes  . Smokeless tobacco: Never Used  Vaping Use  . Vaping Use: Former  . Quit date: 08/18/2015  Substance Use Topics  . Alcohol use: Yes    Comment: occasional  . Drug use: No     Allergies   Patient has no known allergies.   Review of Systems Review of Systems  Constitutional: Negative for fatigue and fever.  HENT: Negative for mouth sores.   Eyes: Negative for visual disturbance.  Respiratory: Negative for shortness of breath.   Cardiovascular: Negative for chest pain.  Gastrointestinal: Negative for abdominal pain, nausea and vomiting.  Genitourinary: Negative for genital sores.  Musculoskeletal: Positive for arthralgias, gait problem and joint swelling.  Skin: Negative for color change, rash and wound.  Neurological: Negative for dizziness, weakness, light-headedness and headaches.     Physical Exam Triage Vital Signs ED Triage Vitals  Enc Vitals Group     BP 07/31/20 1407 (!) 142/94     Pulse Rate 07/31/20 1407 96     Resp 07/31/20 1407 18     Temp 07/31/20 1407 98 F (36.7 C)     Temp src --      SpO2 07/31/20 1407 98 %     Weight --      Height --      Head Circumference --      Peak Flow --      Pain Score 07/31/20 1406 6     Pain Loc --      Pain Edu? --      Excl. in GC? --    No data found.  Updated Vital Signs BP (!) 142/94   Pulse 96   Temp 98 F (36.7 C)   Resp 18   SpO2 98%   Visual Acuity Right Eye Distance:   Left Eye Distance:   Bilateral Distance:    Right Eye Near:   Left Eye Near:    Bilateral Near:     Physical Exam Vitals and nursing note reviewed.  Constitutional:      Appearance: She is well-developed.     Comments: No acute distress  HENT:     Head: Normocephalic and atraumatic.     Nose: Nose normal.  Eyes:     Conjunctiva/sclera: Conjunctivae normal.  Cardiovascular:     Rate and Rhythm: Normal rate.  Pulmonary:     Effort: Pulmonary effort is normal. No respiratory distress.  Abdominal:      General: There is  no distension.  Musculoskeletal:        General: Normal range of motion.     Cervical back: Neck supple.     Comments: Left ankle: No obvious swelling deformity or discoloration, mild tenderness palpation to medial lateral malleolus, increased tenderness to palpation over anterior ankle and anterior shin, nontender throughout dorsum of foot, dorsalis pedis 2+, nontender to palpation along length of Achilles Full active range of motion of ankle  Skin:    General: Skin is warm and dry.  Neurological:     Mental Status: She is alert and oriented to person, place, and time.      UC Treatments / Results  Labs (all labs ordered are listed, but only abnormal results are displayed) Labs Reviewed - No data to display  EKG   Radiology DG Ankle Complete Left  Result Date: 07/31/2020 CLINICAL DATA:  Left ankle pain after twisting injury getting out of a car. EXAM: LEFT ANKLE COMPLETE - 3+ VIEW COMPARISON:  Left ankle x-rays dated Aug 24, 2010. FINDINGS: New small avulsion fracture at the tip of the lateral malleolus. No dislocation. The ankle mortise is symmetric. The talar dome is intact. Joint spaces are preserved. Small tibiotalar joint effusion. Bone mineralization is normal. Mild bimalleolar soft tissue swelling. IMPRESSION: 1. New small avulsion fracture at the tip of the lateral malleolus. Electronically Signed   By: Obie Dredge M.D.   On: 07/31/2020 15:09    Procedures Procedures (including critical care time)  Medications Ordered in UC Medications - No data to display  Initial Impression / Assessment and Plan / UC Course  I have reviewed the triage vital signs and the nursing notes.  Pertinent labs & imaging results that were available during my care of the patient were reviewed by me and considered in my medical decision making (see chart for details).     Small avulsion to lateral malleolus will place in cam walker boot and weightbearing,  anti-inflammatories ice and elevate, follow-up with sports medicine to monitor healing.  Discussed strict return precautions. Patient verbalized understanding and is agreeable with plan.  Final Clinical Impressions(s) / UC Diagnoses   Final diagnoses:  Nondisplaced fracture of lateral malleolus of left fibula, initial encounter for closed fracture     Discharge Instructions     Use anti-inflammatories for pain/swelling. You may take up to 800 mg Ibuprofen every 8 hours with food. You may supplement Ibuprofen with Tylenol [phone] mg every 8 hours.  Wear boot May take off and elevate and ice Follow-up with sports medicine    ED Prescriptions    Medication Sig Dispense Auth. Provider   ibuprofen (ADVIL) 800 MG tablet Take 1 tablet (800 mg total) by mouth 3 (three) times daily. 21 tablet Pernella Ackerley, Trainer C, PA-C     PDMP not reviewed this encounter.   Lew Dawes, PA-C 07/31/20 1521

## 2020-07-31 NOTE — Discharge Instructions (Addendum)
Use anti-inflammatories for pain/swelling. You may take up to 800 mg Ibuprofen every 8 hours with food. You may supplement Ibuprofen with Tylenol [phone] mg every 8 hours.  Wear boot May take off and elevate and ice Follow-up with sports medicine

## 2020-08-04 ENCOUNTER — Ambulatory Visit (HOSPITAL_COMMUNITY): Admission: RE | Admit: 2020-08-04 | Payer: Medicaid Other | Source: Ambulatory Visit

## 2020-08-07 ENCOUNTER — Other Ambulatory Visit: Payer: Self-pay | Admitting: Family Medicine

## 2020-08-08 ENCOUNTER — Ambulatory Visit (INDEPENDENT_AMBULATORY_CARE_PROVIDER_SITE_OTHER): Payer: Medicaid Other | Admitting: Sports Medicine

## 2020-08-08 ENCOUNTER — Telehealth: Payer: Self-pay | Admitting: Cardiology

## 2020-08-08 ENCOUNTER — Other Ambulatory Visit: Payer: Self-pay

## 2020-08-08 VITALS — Ht 69.0 in | Wt 325.0 lb

## 2020-08-08 DIAGNOSIS — S82892A Other fracture of left lower leg, initial encounter for closed fracture: Secondary | ICD-10-CM

## 2020-08-08 NOTE — Telephone Encounter (Signed)
Spoke with patent regarding the Wednesday 08/17/20 3:00pm MRA head appointment at [facility]---arrival time is 2:30 pm--1st floor admissions office for check in. Patient voiced her understanding.

## 2020-08-08 NOTE — Progress Notes (Addendum)
    SUBJECTIVE:   CHIEF COMPLAINT / HPI: follow up fracture left an  Patient reports incident happened about 1 week ago. Was getting out of car and stepped in hole.  Twisted left foot, heard popping and immediately had swelling of the ankle. She reports that her foot turned in toward her.  She was not able to fully weight bear but was able to apply some weight to the ankle at that time.  She reports increase in swelling and bruising and decided to go to the Urgent cre the next day.  She was told she had a hairline fracture, was place in a boot for 6 weeks and advised to follow up with SM.  She is here for evaluation and further recommendations.  She has been compliant with wearing the boot.  Reports swelling and bruising has decreased.  She has some throbbing pain over the shin and around the left ankle for which she takes [employer] and Tylenol.    PERTINENT  PMH / PSH:  None  OBJECTIVE:   Ht 5\' 9"  (1.753 m)   Wt (!) 325 lb (147.4 kg)   BMI 47.99 kg/m    General: Alert, no acute distress Left ankle Inspection: mild edema noted to lateral malleolar area, no ecchymosis noted Palpation: tenderness at the anterior tip of lateral malleolus, no pain at the posterior lateral malleolus edge, base of 5th metatarsal.  ROM: flexion, extension, inversion, eversion limited secondary to pain NVS: distal pulses and cap refill present  Left leg Inspection: no swelling or bruising noted Palpation: tenderness noted over anteriomedial tibial area Homans sign negative, no calf tenderness   ASSESSMENT/PLAN:   Ankle fracture, left Xray showed small avulsion fracture of lateral malleolus.  Stable, nondisplaced.  No surgical intervention required.  Swelling and pain improving. -Will switch to ankle support brace -Crutches for support -ROM exercises -Follow up with Dr in 3/52    4/52, MD [facility]   Patient seen and evaluated with the resident.  I agree  with the above plan of care.  X-rays show a tiny avulsion fracture off of the left lateral malleolus.  This is stable enough that she can discontinue CAM Walker immobilization as pain allows.  We will fit her with a med spec brace, provide crutches to help with ambulation, and she will start range of motion exercises.  Follow-up with me again in 3 weeks.

## 2020-08-08 NOTE — Patient Instructions (Signed)
It was a pleasure meeting you today.  Your xray shows a small fracture of the ankle.  This is treated like a bad sprain.  Today we will change your brace to an ankle support brace.    Start range of motion exercises as given to you.  You can do these 3 times a day.  You may still have some pain at the site so please use the crutches to help with weight bearing.  Dr Margaretha Sheffield will follow up with you in 3 weeks.  Dana Allan, MD Family Medicine Residency

## 2020-08-09 ENCOUNTER — Encounter: Payer: Self-pay | Admitting: Sports Medicine

## 2020-08-09 ENCOUNTER — Other Ambulatory Visit: Payer: Self-pay | Admitting: Critical Care Medicine

## 2020-08-09 DIAGNOSIS — S82892A Other fracture of left lower leg, initial encounter for closed fracture: Secondary | ICD-10-CM | POA: Insufficient documentation

## 2020-08-09 MED ORDER — ALBUTEROL SULFATE HFA 108 (90 BASE) MCG/ACT IN AERS: 2.0000 | INHALATION_SPRAY | RESPIRATORY_TRACT | 0 refills | Status: DC | PRN

## 2020-08-09 MED ORDER — FLUTICASONE PROPIONATE 50 MCG/ACT NA SUSP: 1.0000 | Freq: Every day | NASAL | 0 refills | Status: DC

## 2020-08-09 NOTE — Telephone Encounter (Signed)
  Notes to clinic: Patient would like a 90 day supply    Requested Prescriptions  Pending Prescriptions Disp Refills   fluticasone (FLONASE) 50 MCG/ACT nasal spray [Pharmacy Med Name: FLUTICASONE NASAL SP (120) RX] 48 g     Sig: SHAKE LIQUID AND USE 1 TO 2 SPRAYS IN EACH NOSTRIL DAILY      Ear, Nose, and Throat: Nasal Preparations - Corticosteroids Passed - 08/09/2020  9:31 AM      Passed - Valid encounter within last 12 months    Recent Outpatient Visits           2 months ago Muscle spasm of back   [facility] [place], [place], [place]   6 months ago Neck pain   [facility] [place], [place], [place]   10 months ago Cough syncope syndrome   [facility] [place], [place], [place]   1 year ago PCOS (polycystic ovarian syndrome)   [facility] Hoy Register, MD   1 year ago Left ear pain   [facility] [place], [place], [place]

## 2020-08-09 NOTE — Assessment & Plan Note (Signed)
Xray showed small avulsion fracture of lateral malleolus.  Stable, nondisplaced.  No surgical intervention required.  Swelling and pain improving. -Will switch to ankle support brace -Crutches for support -ROM exercises -Follow up with Dr Margaretha Sheffield in 3/52

## 2020-08-17 ENCOUNTER — Ambulatory Visit (HOSPITAL_COMMUNITY): Admission: RE | Admit: 2020-08-17 | Payer: Medicaid Other | Source: Ambulatory Visit

## 2020-08-23 ENCOUNTER — Telehealth: Payer: Self-pay

## 2020-08-23 ENCOUNTER — Ambulatory Visit: Payer: Medicaid Other

## 2020-08-23 NOTE — Telephone Encounter (Signed)
Spoke with pt regarding late cancel today. Pt was informed via phone call after last no show that she would be discharged if she missed another appointment or canceled day of. Pt was understanding. Advised her to f/u with MD, should she need to.   Bettey Mare. Corliss Marcus, PT, DPT

## 2020-08-25 ENCOUNTER — Ambulatory Visit: Payer: Medicaid Other | Admitting: Physical Therapy

## 2020-08-25 ENCOUNTER — Other Ambulatory Visit: Payer: Self-pay | Admitting: Family Medicine

## 2020-08-25 ENCOUNTER — Encounter: Payer: Self-pay | Admitting: Family Medicine

## 2020-08-25 DIAGNOSIS — M62830 Muscle spasm of back: Secondary | ICD-10-CM

## 2020-08-26 ENCOUNTER — Ambulatory Visit (HOSPITAL_COMMUNITY): Payer: Medicaid Other | Attending: Internal Medicine

## 2020-08-26 ENCOUNTER — Other Ambulatory Visit: Payer: Self-pay

## 2020-08-26 DIAGNOSIS — R55 Syncope and collapse: Secondary | ICD-10-CM | POA: Diagnosis present

## 2020-08-26 DIAGNOSIS — I10 Essential (primary) hypertension: Secondary | ICD-10-CM

## 2020-08-26 DIAGNOSIS — R0683 Snoring: Secondary | ICD-10-CM | POA: Insufficient documentation

## 2020-08-26 LAB — ECHOCARDIOGRAM COMPLETE
Area-P 1/2: 3.1 cm2
S' Lateral: 2.6 cm

## 2020-08-30 ENCOUNTER — Encounter: Payer: Self-pay | Admitting: Family Medicine

## 2020-08-30 ENCOUNTER — Ambulatory Visit: Payer: Medicaid Other | Admitting: Sports Medicine

## 2020-09-05 NOTE — Telephone Encounter (Signed)
Spoke to pt regarding [employer]. Pt state last night she had been crying due to a stressful week then developed chest tightness and felt like it was hard to breath. She report symptoms initially only lasted about 10-15 minutes, then later reoccurred and lasted all night. Pt state she is still experiencing a little chest discomfort and SOB.  Based on pt current symptoms, nurse recommended pt report to ER for further evaluations. Pt verbalized understanding.

## 2020-09-12 ENCOUNTER — Encounter: Payer: Self-pay | Admitting: Family Medicine

## 2020-09-12 ENCOUNTER — Telehealth: Payer: Self-pay | Admitting: Family Medicine

## 2020-09-12 ENCOUNTER — Telehealth: Payer: Self-pay

## 2020-09-12 ENCOUNTER — Other Ambulatory Visit: Payer: Self-pay

## 2020-09-12 ENCOUNTER — Ambulatory Visit: Payer: Medicaid Other | Attending: Family Medicine | Admitting: Family Medicine

## 2020-09-12 DIAGNOSIS — F32A Depression, unspecified: Secondary | ICD-10-CM

## 2020-09-12 DIAGNOSIS — F419 Anxiety disorder, unspecified: Secondary | ICD-10-CM

## 2020-09-12 MED ORDER — HYDROXYZINE HCL 25 MG PO TABS: 25.0000 mg | ORAL_TABLET | Freq: Three times a day (TID) | ORAL | 1 refills | Status: DC | PRN

## 2020-09-12 MED ORDER — FLUOXETINE HCL 20 MG PO TABS: 20.0000 mg | ORAL_TABLET | Freq: Every day | ORAL | 3 refills | Status: DC

## 2020-09-12 NOTE — Telephone Encounter (Signed)
Following up on a request from PCP on connecting the patient with mental health resources and a appt for with [facility]. Verified the patients information: name and date of birth.   Patient shared she has been in counseling before and would like to be restart therapy with a female provider. Case Manager coordinated scheduling BH appts of the patient: 7/19 at 9am  for assessment/therapy and 7/22 at 8am with  psych/med mgmt [telehealth].   Dates, times and location have been shared with the patient.

## 2020-09-12 NOTE — Telephone Encounter (Signed)
Patient has been scheduled with [facility] for the following appts: 7/19 at 9am with[in person] and 7/22 at 8am with for psych/med mgmt [telehealth].

## 2020-09-12 NOTE — Telephone Encounter (Signed)
This patient will benefit from psychotherapy for anxiety and depression.  Could you please refer her?  Thank you

## 2020-09-12 NOTE — Progress Notes (Signed)
Virtual Visit via Telephone Note  I connected with Ann Moran, on 09/12/2020 at 8:11 AM by telephone due to the COVID-19 pandemic and verified that I am speaking with the correct person using two identifiers.   Consent: I discussed the limitations, risks, security and privacy concerns of performing an evaluation and management service by telephone and the availability of in person appointments. I also discussed with the patient that there may be a patient responsible charge related to this service. The patient expressed understanding and agreed to proceed.   Location of Patient: Home  Location of Provider: Clinic   Persons participating in Telemedicine visit: [name] Dr. Alvis Lemmings     History of Present Illness: This is a 35 year old female with a history of hypertension, PCOS seen today for follow-up visit.   2 weeks ago she was crying and was upset and this occurred again yesterday. This was trigerred by being yelled at. Her chest was tight, sore,  She had difficulty breathing and she had a headache and it took her hours to calm down. Her Mom was on the phone with her and had her do some breathing exercise She has been dealing with Depression for years previously followed by [facility] and was on medications which she stated made her drowsy.  She has had suicidal ideations but no intent no homicidal ideations.  Endorses presence of anhedonia, not wanting to get out of bed. She is open to medications and psychotherapy. Past Medical History:  Diagnosis Date  . Allergy   . Anemia   . Anxiety   . Chlamydia   . Depression    hx of meds, none currently- doing ok  . Diabetes mellitus without complication (HCC)   . GERD (gastroesophageal reflux disease)   . Infection due to trichomonas   . MRSA (methicillin resistant staph aureus) culture positive    Dec 2012  . Obesity   . Pregnancy induced hypertension   . Urinary tract infection    No Known Allergies  Current  Outpatient Medications on File Prior to Visit  Medication Sig Dispense Refill  . albuterol (VENTOLIN HFA) 108 (90 Base) MCG/ACT inhaler Inhale 2 puffs into the lungs every 4 (four) hours as needed for wheezing or shortness of breath. 18 g 0  . amLODipine (NORVASC) 10 MG tablet Take 10 mg by mouth daily.    Marland Kitchen amoxicillin (AMOXIL) 500 MG capsule Take 1 capsule (500 mg total) by mouth 3 (three) times daily. 30 capsule 0  . cetirizine (ZYRTEC) 10 MG tablet TAKE 1 TABLET(10 MG) BY MOUTH DAILY 90 tablet 0  . fluticasone (FLONASE) 50 MCG/ACT nasal spray SHAKE LIQUID AND USE 1 TO 2 SPRAYS IN EACH NOSTRIL DAILY 16 g 0  . glyBURIDE (DIABETA) 5 MG tablet Take 1 tablet (5 mg total) by mouth daily with breakfast. 60 tablet 5  . hydrOXYzine (ATARAX/VISTARIL) 25 MG tablet Take 1 tablet (25 mg total) by mouth every 8 (eight) hours as needed. 60 tablet 1  . ibuprofen (ADVIL) 800 MG tablet Take 1 tablet (800 mg total) by mouth 3 (three) times daily. 21 tablet 0  . omeprazole (PRILOSEC) 20 MG capsule Take 1 capsule (20 mg total) by mouth daily. 30 capsule 0  . [DISCONTINUED] citalopram (CELEXA) 20 MG tablet Take 1 tablet (20 mg total) by mouth daily. (Patient not taking: Reported on 03/14/2020) 30 tablet 3  . [DISCONTINUED] medroxyPROGESTERone (PROVERA) 5 MG tablet Take 2 tablets (10 mg total) by mouth daily. (Patient not taking:  Reported on 07/20/2018) 14 tablet 0   No current facility-administered medications on file prior to visit.    ROS: See HPI  Observations/Objective: Awake, alert, oriented x3 Not in acute distress  Assessment and Plan: 1. Anxiety and depression Uncontrolled Will refer for Psychotherapy and message has been sent to Social Worker Counseled on onset of action and adverse effects of medications - hydrOXYzine (ATARAX/VISTARIL) 25 MG tablet; Take 1 tablet (25 mg total) by mouth every 8 (eight) hours as needed for anxiety.  Dispense: 60 tablet; Refill: 1 - FLUoxetine (PROZAC) 20 MG  tablet; Take 1 tablet (20 mg total) by mouth daily.  Dispense: 30 tablet; Refill: 3   Follow Up Instructions: Advised to call Clinic to schedule follow up in 2 months   I discussed the assessment and treatment plan with the patient. The patient was provided an opportunity to ask questions and all were answered. The patient agreed with the plan and demonstrated an understanding of the instructions.   The patient was advised to call back or seek an in-person evaluation if the symptoms worsen or if the condition fails to improve as anticipated.     I provided 14 minutes total of non-face-to-face time during this encounter.   Hoy Register, MD, FAAFP. [facility] [place], [place] [phone]   09/12/2020, 8:11 AM

## 2020-09-18 ENCOUNTER — Encounter (HOSPITAL_BASED_OUTPATIENT_CLINIC_OR_DEPARTMENT_OTHER): Payer: Medicaid Other | Admitting: Cardiovascular Disease

## 2020-09-23 ENCOUNTER — Ambulatory Visit
Admission: RE | Admit: 2020-09-23 | Discharge: 2020-09-23 | Disposition: A | Discharge status: Home / Self Care | Payer: Medicaid Other | Source: Ambulatory Visit | Attending: Student | Admitting: Student

## 2020-09-23 ENCOUNTER — Other Ambulatory Visit: Payer: Self-pay

## 2020-09-23 VITALS — BP 134/91 | HR 94 | Temp 98.3°F | Resp 18

## 2020-09-23 DIAGNOSIS — Z1152 Encounter for screening for COVID-19: Secondary | ICD-10-CM

## 2020-09-23 DIAGNOSIS — E119 Type 2 diabetes mellitus without complications: Secondary | ICD-10-CM | POA: Diagnosis not present

## 2020-09-23 DIAGNOSIS — J069 Acute upper respiratory infection, unspecified: Secondary | ICD-10-CM | POA: Diagnosis not present

## 2020-09-23 DIAGNOSIS — K219 Gastro-esophageal reflux disease without esophagitis: Secondary | ICD-10-CM

## 2020-09-23 MED ORDER — LIDOCAINE VISCOUS HCL 2 % MT SOLN: 15.0000 mL | OROMUCOSAL | 0 refills | Status: DC | PRN

## 2020-09-23 MED ORDER — PROMETHAZINE-DM 6.25-15 MG/5ML PO SYRP: 5.0000 mL | ORAL_SOLUTION | Freq: Four times a day (QID) | ORAL | 0 refills | Status: DC | PRN

## 2020-09-23 MED ORDER — PREDNISONE 20 MG PO TABS: 40.0000 mg | ORAL_TABLET | Freq: Every day | ORAL | 0 refills | Status: AC

## 2020-09-23 NOTE — ED Triage Notes (Signed)
Patient presents to Urgent Care with complaints of cough, fever (last temp 100.3), runny noise, and headache since 05/29.  Pt states she has a hx of seasonal allergies. Treating symptoms with elderberry, zinc, tylenol (last dose last night),  Zyrtec.

## 2020-09-23 NOTE — Discharge Instructions (Signed)
-  Prednisone, 2 pills taken at the same time for 5 days in a row.  Try taking this earlier in the day as it can give you energy. Try to avoid ibuprofen while taking this medication as it can increase your chance of gastritis and gastrointestinal bleeding when combined with prednisone. -Promethazine DM cough syrup for congestion/cough. This could make you drowsy, so take at night before bed. -For sore throat, use lidocaine mouthwash up to every 4 hours. Make sure not to eat for at least 1 hour after using this, as your mouth will be very numb and you could bite yourself. -For fevers/chills, bodyaches, headaches- tylenol -For GERD, continue the omeprazole.

## 2020-09-23 NOTE — ED Provider Notes (Signed)
[facility]    CSN: [id number] Arrival date & time: 09/23/20  1212      History   Chief Complaint Chief Complaint  Patient presents with  . Cough  . Headache  . Nasal Congestion  . Appointment    1200     HPI ILLYRIA SOBOCINSKI is a 35 y.o. female  presenting with cough, headaches, nasal congestion x1 week.  Medical history chronic cough, diabetes, GERD, obesity. Frequent hacking cough. Nasal congestion. Throbbing headache behind forehead. Notes fevers as high as 100.3 at home. Has not taken antipyretic today. Denies n/v/d/abd pain. Hx seasonal allergies, zyrtec providing minimal relief. Denies n/v/d, shortness of breath, chest pain,  facial pain, teeth pain, sore throat, loss of taste/smell, swollen lymph nodes, ear pain.    HPI  Past Medical History:  Diagnosis Date  . Allergy   . Anemia   . Anxiety   . Chlamydia   . Depression    hx of meds, none currently- doing ok  . Diabetes mellitus without complication (HCC)   . GERD (gastroesophageal reflux disease)   . Infection due to trichomonas   . MRSA (methicillin resistant staph aureus) culture positive    Dec 2012  . Obesity   . Pregnancy induced hypertension   . Urinary tract infection     Patient Active Problem List   Diagnosis Date Noted  . Ankle fracture, left 08/09/2020  . Dysphagia 05/20/2019  . Encounter for other preprocedural examination 05/20/2019  . PCOS (polycystic ovarian syndrome) 02/24/2019  . Neck pain 02/24/2019  . Morbid obesity with body mass index (BMI) of 45.0 to 49.9 in adult [facility]) 02/24/2019  . Essential hypertension 02/24/2019  . Gastroesophageal reflux disease 02/24/2019  . Irregular menstrual cycle 01/14/2019  . Abnormal uterine bleeding 01/14/2019  . Chronic cough 01/14/2019    Past Surgical History:  Procedure Laterality Date  . CHOLECYSTECTOMY    . ECTOPIC PREGNANCY SURGERY      OB History    Gravida  2   Para  1   Term  0   Preterm  1   AB  1   Living   1     SAB      IAB      Ectopic  1   Multiple      Live Births  1            Home Medications    Prior to Admission medications   Medication Sig Start Date End Date Taking? Authorizing Provider  lidocaine (XYLOCAINE) 2 % solution Use as directed 15 mLs in the mouth or throat as needed for mouth pain. 09/23/20  Yes Rhys Martini, PA-C  predniSONE (DELTASONE) 20 MG tablet Take 2 tablets (40 mg total) by mouth daily for 5 days. 09/23/20 09/28/20 Yes Rhys Martini, PA-C  promethazine-dextromethorphan (PROMETHAZINE-DM) 6.25-15 MG/5ML syrup Take 5 mLs by mouth 4 (four) times daily as needed for cough. 09/23/20  Yes Rhys Martini, PA-C  albuterol (VENTOLIN HFA) 108 (90 Base) MCG/ACT inhaler Inhale 2 puffs into the lungs every 4 (four) hours as needed for wheezing or shortness of breath. 08/09/20   Storm Frisk, MD  amLODipine (NORVASC) 10 MG tablet Take 10 mg by mouth daily.    [provider]  amoxicillin (AMOXIL) 500 MG capsule Take 1 capsule (500 mg total) by mouth 3 (three) times daily. 07/26/20   Hoy Register, MD  cetirizine (ZYRTEC) 10 MG tablet TAKE 1 TABLET(10 MG) BY MOUTH DAILY  07/05/20   Hoy Register, MD  FLUoxetine (PROZAC) 20 MG tablet Take 1 tablet (20 mg total) by mouth daily. 09/12/20   Hoy Register, MD  fluticasone (FLONASE) 50 MCG/ACT nasal spray SHAKE LIQUID AND USE 1 TO 2 SPRAYS IN EACH NOSTRIL DAILY 08/10/20   Hoy Register, MD  glyBURIDE (DIABETA) 5 MG tablet Take 1 tablet (5 mg total) by mouth daily with breakfast. 03/14/20   Sharyon Cable, CNM  hydrOXYzine (ATARAX/VISTARIL) 25 MG tablet Take 1 tablet (25 mg total) by mouth every 8 (eight) hours as needed for anxiety. 09/12/20   Hoy Register, MD  ibuprofen (ADVIL) 800 MG tablet Take 1 tablet (800 mg total) by mouth 3 (three) times daily. 07/31/20   Wieters, Hallie C, PA-C  omeprazole (PRILOSEC) 20 MG capsule Take 1 capsule (20 mg total) by mouth daily. 07/20/20   Georgetta Haber, NP   citalopram (CELEXA) 20 MG tablet Take 1 tablet (20 mg total) by mouth daily. Patient not taking: Reported on 03/14/2020 10/19/19 03/23/20  Hoy Register, MD  medroxyPROGESTERone (PROVERA) 5 MG tablet Take 2 tablets (10 mg total) by mouth daily. Patient not taking: Reported on 07/20/2018 06/30/18 12/06/18  Geoffery Lyons, MD    Family History Family History  Problem Relation Age of Onset  . Hypertension Mother   . Diabetes Mother   . Asthma Father   . Hypertension Father   . Stroke Maternal Grandmother   . Diabetes Paternal Grandmother   . Hypertension Paternal Grandmother   . Colon cancer Paternal Grandmother   . Colon cancer Paternal Aunt   . Esophageal cancer Neg Hx   . Stomach cancer Neg Hx   . Rectal cancer Neg Hx     Social History Social History   Tobacco Use  . Smoking status: Current Every Day Smoker    Packs/day: 0.50    Years: 9.00    Pack years: 4.50    Types: Cigarettes  . Smokeless tobacco: Never Used  Vaping Use  . Vaping Use: Former  . Quit date: 08/18/2015  Substance Use Topics  . Alcohol use: Yes    Comment: occasional  . Drug use: No     Allergies   Patient has no known allergies.   Review of Systems Review of Systems  Constitutional: Negative for appetite change, chills and fever.  HENT: Positive for congestion. Negative for ear pain, rhinorrhea, sinus pressure, sinus pain and sore throat.   Eyes: Negative for redness and visual disturbance.  Respiratory: Positive for cough. Negative for chest tightness, shortness of breath and wheezing.   Cardiovascular: Negative for chest pain and palpitations.  Gastrointestinal: Negative for abdominal pain, constipation, diarrhea, nausea and vomiting.  Genitourinary: Negative for dysuria, frequency and urgency.  Musculoskeletal: Negative for myalgias.  Neurological: Positive for headaches. Negative for dizziness and weakness.  Psychiatric/Behavioral: Negative for confusion.  All other systems reviewed and  are negative.    Physical Exam Triage Vital Signs ED Triage Vitals [09/23/20 1323]  Enc Vitals Group     BP (!) 134/91     Pulse Rate 94     Resp 18     Temp 98.3 F (36.8 C)     Temp src      SpO2 96 %     Weight      Height      Head Circumference      Peak Flow      Pain Score      Pain Loc  Pain Edu?      Excl. in GC?    No data found.  Updated Vital Signs BP (!) 134/91 (BP Location: Left Arm)   Pulse 94   Temp 98.3 F (36.8 C)   Resp 18   LMP  (Within Months) Comment: has not had a menstrual period this year   SpO2 96%   Visual Acuity Right Eye Distance:   Left Eye Distance:   Bilateral Distance:    Right Eye Near:   Left Eye Near:    Bilateral Near:     Physical Exam Vitals reviewed.  Constitutional:      General: She is not in acute distress.    Appearance: Normal appearance. She is not ill-appearing.  HENT:     Head: Normocephalic and atraumatic.     Right Ear: Hearing, tympanic membrane, ear canal and external ear normal. No swelling or tenderness. There is no impacted cerumen. No mastoid tenderness. Tympanic membrane is not perforated, erythematous, retracted or bulging.     Left Ear: Hearing, tympanic membrane, ear canal and external ear normal. No swelling or tenderness. There is no impacted cerumen. No mastoid tenderness. Tympanic membrane is not perforated, erythematous, retracted or bulging.     Nose:     Right Sinus: No maxillary sinus tenderness or frontal sinus tenderness.     Left Sinus: No maxillary sinus tenderness or frontal sinus tenderness.     Mouth/Throat:     Mouth: Mucous membranes are moist.     Pharynx: Uvula midline. No oropharyngeal exudate or posterior oropharyngeal erythema.     Tonsils: No tonsillar exudate.  Cardiovascular:     Rate and Rhythm: Normal rate and regular rhythm.     Heart sounds: Normal heart sounds.  Pulmonary:     Breath sounds: Normal breath sounds and air entry. No decreased breath sounds,  wheezing, rhonchi or rales.     Comments: occ cough Chest:     Chest wall: No tenderness.  Abdominal:     General: Abdomen is flat. Bowel sounds are normal.     Tenderness: There is no abdominal tenderness. There is no guarding or rebound.  Lymphadenopathy:     Cervical: No cervical adenopathy.  Neurological:     General: No focal deficit present.     Mental Status: She is alert and oriented to person, place, and time.  Psychiatric:        Attention and Perception: Attention and perception normal.        Mood and Affect: Mood and affect normal.        Behavior: Behavior normal. Behavior is cooperative.        Thought Content: Thought content normal.        Judgment: Judgment normal.      UC Treatments / Results  Labs (all labs ordered are listed, but only abnormal results are displayed) Labs Reviewed  COVID-19, FLU A+B NAA    EKG   Radiology No results found.  Procedures Procedures (including critical care time)  Medications Ordered in UC Medications - No data to display  Initial Impression / Assessment and Plan / UC Course  I have reviewed the triage vital signs and the nursing notes.  Pertinent labs & imaging results that were available during my care of the patient were reviewed by me and considered in my medical decision making (see chart for details).    This patient is a 35 year old female presenting with viral URI with cough.  She is afebrile, nontachycardic, nontachypneic,  oxygenating well on room air without wheezes rhonchi or rales. Last dose of antipyretic was 20 hours ago.   Pt with diabetes, A1c 5.7 on 09/2019. Diet controlled.   Covid and influenza sent.   Prednisone, promethazine, viscous lidocaine.   ED return precautions discussed .   Final Clinical Impressions(s) / UC Diagnoses   Final diagnoses:  Viral URI with cough  Encounter for screening for COVID-19  Diet-controlled diabetes mellitus (HCC)  Gastroesophageal reflux disease without  esophagitis     Discharge Instructions     -Prednisone, 2 pills taken at the same time for 5 days in a row.  Try taking this earlier in the day as it can give you energy. Try to avoid ibuprofen while taking this medication as it can increase your chance of gastritis and gastrointestinal bleeding when combined with prednisone. -Promethazine DM cough syrup for congestion/cough. This could make you drowsy, so take at night before bed. -For sore throat, use lidocaine mouthwash up to every 4 hours. Make sure not to eat for at least 1 hour after using this, as your mouth will be very numb and you could bite yourself. -For fevers/chills, bodyaches, headaches- tylenol -For GERD, continue the omeprazole.     ED Prescriptions    Medication Sig Dispense Auth. Provider   predniSONE (DELTASONE) 20 MG tablet Take 2 tablets (40 mg total) by mouth daily for 5 days. 10 tablet Rhys MartiniGraham, Kayonna Lawniczak E, PA-C   promethazine-dextromethorphan (PROMETHAZINE-DM) 6.25-15 MG/5ML syrup Take 5 mLs by mouth 4 (four) times daily as needed for cough. 118 mL Ignacia BayleyGraham, Jariya Reichow E, PA-C   lidocaine (XYLOCAINE) 2 % solution Use as directed 15 mLs in the mouth or throat as needed for mouth pain. 100 mL Rhys MartiniGraham, Ifeanyi Mickelson E, PA-C     PDMP not reviewed this encounter.   Rhys MartiniGraham, Yousuf Ager E, PA-C 09/23/20 1422

## 2020-09-25 LAB — COVID-19, FLU A+B NAA
Influenza A, NAA: NOT DETECTED
Influenza B, NAA: NOT DETECTED
SARS-CoV-2, NAA: NOT DETECTED

## 2020-10-08 ENCOUNTER — Ambulatory Visit
Admission: RE | Admit: 2020-10-08 | Discharge status: Absent without leave | Payer: Medicaid Other | Source: Ambulatory Visit | Attending: Family Medicine | Admitting: Family Medicine

## 2020-10-08 ENCOUNTER — Encounter: Payer: Self-pay | Admitting: *Deleted

## 2020-10-08 ENCOUNTER — Ambulatory Visit
Admission: EM | Admit: 2020-10-08 | Discharge: 2020-10-08 | Disposition: A | Discharge status: Home / Self Care | Payer: Medicaid Other | Attending: Physician Assistant | Admitting: Physician Assistant

## 2020-10-08 ENCOUNTER — Other Ambulatory Visit: Payer: Self-pay

## 2020-10-08 DIAGNOSIS — J019 Acute sinusitis, unspecified: Secondary | ICD-10-CM | POA: Diagnosis not present

## 2020-10-08 MED ORDER — AMOXICILLIN-POT CLAVULANATE 875-125 MG PO TABS: 1.0000 | ORAL_TABLET | Freq: Two times a day (BID) | ORAL | 0 refills | Status: DC

## 2020-10-08 MED ORDER — PROMETHAZINE-DM 6.25-15 MG/5ML PO SYRP: 5.0000 mL | ORAL_SOLUTION | Freq: Four times a day (QID) | ORAL | 0 refills | Status: DC | PRN

## 2020-10-08 NOTE — ED Provider Notes (Signed)
[facility]    CSN: [id number] Arrival date & time: 10/08/20  1224      History   Chief Complaint Chief Complaint  Patient presents with   Cough   Nasal Congestion    HPI Ann Moran is a 35 y.o. female.   Pt presents with persistent cough, congestion, sinus pressure that started two weeks ago.  She was recently seen 09/23/2020 and prescribed prednisone and promethazine.  COVID and flu negative at this visit. She reports some improvement since that time, but has had persistent sinus pressure and cough.  Promethazine is helping with cough, but she has run out. She denies wheezing, fever, chills, n/v/d, chest pain, chest tightness.      Past Medical History:  Diagnosis Date   Allergy    Anemia    Anxiety    Chlamydia    Depression    hx of meds, none currently- doing ok   Diabetes mellitus without complication (HCC)    GERD (gastroesophageal reflux disease)    Infection due to trichomonas    MRSA (methicillin resistant staph aureus) culture positive    Dec 2012   Obesity    Pregnancy induced hypertension    Urinary tract infection     Patient Active Problem List   Diagnosis Date Noted   Ankle fracture, left 08/09/2020   Dysphagia 05/20/2019   Encounter for other preprocedural examination 05/20/2019   PCOS (polycystic ovarian syndrome) 02/24/2019   Neck pain 02/24/2019   Morbid obesity with body mass index (BMI) of 45.0 to 49.9 in adult [facility]) 02/24/2019   Essential hypertension 02/24/2019   Gastroesophageal reflux disease 02/24/2019   Irregular menstrual cycle 01/14/2019   Abnormal uterine bleeding 01/14/2019   Chronic cough 01/14/2019    Past Surgical History:  Procedure Laterality Date   CHOLECYSTECTOMY     ECTOPIC PREGNANCY SURGERY      OB History     Gravida  2   Para  1   Term  0   Preterm  1   AB  1   Living  1      SAB      IAB      Ectopic  1   Multiple      Live Births  1            Home Medications     Prior to Admission medications   Medication Sig Start Date End Date Taking? Authorizing Provider  amoxicillin-clavulanate (AUGMENTIN) 875-125 MG tablet Take 1 tablet by mouth every 12 (twelve) hours. 10/08/20  Yes Shenica Holzheimer, PA-C  lidocaine (XYLOCAINE) 2 % solution Use as directed 15 mLs in the mouth or throat as needed for mouth pain. 09/23/20  Yes Rhys Martini, PA-C  albuterol (VENTOLIN HFA) 108 (90 Base) MCG/ACT inhaler Inhale 2 puffs into the lungs every 4 (four) hours as needed for wheezing or shortness of breath. 08/09/20   Storm Frisk, MD  amLODipine (NORVASC) 10 MG tablet Take 10 mg by mouth daily.    [provider]  cetirizine (ZYRTEC) 10 MG tablet TAKE 1 TABLET(10 MG) BY MOUTH DAILY 07/05/20   Hoy Register, MD  FLUoxetine (PROZAC) 20 MG tablet Take 1 tablet (20 mg total) by mouth daily. 09/12/20   Hoy Register, MD  fluticasone (FLONASE) 50 MCG/ACT nasal spray SHAKE LIQUID AND USE 1 TO 2 SPRAYS IN EACH NOSTRIL DAILY 08/10/20   Hoy Register, MD  glyBURIDE (DIABETA) 5 MG tablet Take 1 tablet (5 mg total)  by mouth daily with breakfast. 03/14/20   Sharyon Cable, CNM  hydrOXYzine (ATARAX/VISTARIL) 25 MG tablet Take 1 tablet (25 mg total) by mouth every 8 (eight) hours as needed for anxiety. 09/12/20   Hoy Register, MD  ibuprofen (ADVIL) 800 MG tablet Take 1 tablet (800 mg total) by mouth 3 (three) times daily. 07/31/20   Wieters, Hallie C, PA-C  omeprazole (PRILOSEC) 20 MG capsule Take 1 capsule (20 mg total) by mouth daily. 07/20/20   Georgetta Haber, NP  promethazine-dextromethorphan (PROMETHAZINE-DM) 6.25-15 MG/5ML syrup Take 5 mLs by mouth 4 (four) times daily as needed for cough. 10/08/20   Jodell Cipro, PA-C  citalopram (CELEXA) 20 MG tablet Take 1 tablet (20 mg total) by mouth daily. Patient not taking: Reported on 03/14/2020 10/19/19 03/23/20  Hoy Register, MD  medroxyPROGESTERone (PROVERA) 5 MG tablet Take 2 tablets (10 mg total) by mouth  daily. Patient not taking: Reported on 07/20/2018 06/30/18 12/06/18  Geoffery Lyons, MD    Family History Family History  Problem Relation Age of Onset   Hypertension Mother    Diabetes Mother    Asthma Father    Hypertension Father    Stroke Maternal Grandmother    Diabetes Paternal Grandmother    Hypertension Paternal Grandmother    Colon cancer Paternal Grandmother    Colon cancer Paternal Aunt    Esophageal cancer Neg Hx    Stomach cancer Neg Hx    Rectal cancer Neg Hx     Social History Social History   Tobacco Use   Smoking status: Every Day    Packs/day: 0.50    Years: 9.00    Pack years: 4.50    Types: Cigarettes   Smokeless tobacco: Never  Vaping Use   Vaping Use: Some days   Last attempt to quit: 08/18/2015   Devices: "rarely"  Substance Use Topics   Alcohol use: Not Currently   Drug use: No     Allergies   Patient has no known allergies.   Review of Systems Review of Systems  Constitutional:  Negative for chills and fever.  HENT:  Positive for congestion, sinus pressure and sinus pain. Negative for ear pain and sore throat.   Eyes:  Negative for pain and visual disturbance.  Respiratory:  Positive for cough. Negative for shortness of breath and wheezing.   Cardiovascular:  Negative for chest pain and palpitations.  Gastrointestinal:  Negative for abdominal pain and vomiting.  Genitourinary:  Negative for dysuria and hematuria.  Musculoskeletal:  Negative for arthralgias and back pain.  Skin:  Negative for color change and rash.  Neurological:  Negative for seizures and syncope.  All other systems reviewed and are negative.   Physical Exam Triage Vital Signs ED Triage Vitals  Enc Vitals Group     BP 10/08/20 1323 112/81     Pulse Rate 10/08/20 1323 (!) 107     Resp 10/08/20 1323 18     Temp 10/08/20 1323 (!) 97.5 F (36.4 C)     Temp Source 10/08/20 1323 Temporal     SpO2 10/08/20 1323 95 %     Weight --      Height --      Head  Circumference --      Peak Flow --      Pain Score 10/08/20 1325 9     Pain Loc --      Pain Edu? --      Excl. in GC? --    No data  found.  Updated Vital Signs BP 112/81   Pulse (!) 107   Temp (!) 97.5 F (36.4 C) (Temporal)   Resp 18   LMP 10/05/2020 (Exact Date)   SpO2 95%   Visual Acuity Right Eye Distance:   Left Eye Distance:   Bilateral Distance:    Right Eye Near:   Left Eye Near:    Bilateral Near:     Physical Exam Vitals and nursing note reviewed.  Constitutional:      General: She is not in acute distress.    Appearance: She is well-developed.  HENT:     Head: Normocephalic and atraumatic.     Nose: Congestion present.     Right Sinus: Maxillary sinus tenderness present.     Left Sinus: Maxillary sinus tenderness present.  Eyes:     Conjunctiva/sclera: Conjunctivae normal.  Cardiovascular:     Rate and Rhythm: Normal rate and regular rhythm.     Heart sounds: No murmur heard. Pulmonary:     Effort: Pulmonary effort is normal. No respiratory distress.     Breath sounds: Normal breath sounds.  Abdominal:     Palpations: Abdomen is soft.     Tenderness: There is no abdominal tenderness.  Musculoskeletal:     Cervical back: Neck supple.  Skin:    General: Skin is warm and dry.  Neurological:     Mental Status: She is alert.     UC Treatments / Results  Labs (all labs ordered are listed, but only abnormal results are displayed) Labs Reviewed - No data to display  EKG   Radiology No results found.  Procedures Procedures (including critical care time)  Medications Ordered in UC Medications - No data to display  Initial Impression / Assessment and Plan / UC Course  I have reviewed the triage vital signs and the nursing notes.  Pertinent labs & imaging results that were available during my care of the patient were reviewed by me and considered in my medical decision making (see chart for details).     Lungs clear, O2 95%, pt  afebrile. Initially had improvement now complains of persistent sinus pain and pressure and cough. Will treat sinus infection.  Augmentin prescribed.  Refill of promethazine given.  Return precautions discussed.  Final Clinical Impressions(s) / UC Diagnoses   Final diagnoses:  Acute non-recurrent sinusitis, unspecified location     Discharge Instructions      Take medication as prescribed Continue with cough syrup as needed.  Drink plenty of fluids, rest.    ED Prescriptions     Medication Sig Dispense Auth. Provider   amoxicillin-clavulanate (AUGMENTIN) 875-125 MG tablet Take 1 tablet by mouth every 12 (twelve) hours. 14 tablet Lynnzie Blackson, PA-C   promethazine-dextromethorphan (PROMETHAZINE-DM) 6.25-15 MG/5ML syrup Take 5 mLs by mouth 4 (four) times daily as needed for cough. 118 mL Jodell Cipro, PA-C      PDMP not reviewed this encounter.   Jodell Cipro, PA-C 10/20/20 1558

## 2020-10-08 NOTE — ED Triage Notes (Signed)
C/O cough, nasal congestion, sore throat x 2 wks without fevers.

## 2020-10-08 NOTE — Discharge Instructions (Signed)
Take medication as prescribed Continue with cough syrup as needed.  Drink plenty of fluids, rest.

## 2020-10-17 ENCOUNTER — Ambulatory Visit: Payer: Medicaid Other | Admitting: Family Medicine

## 2020-10-17 ENCOUNTER — Encounter: Payer: Self-pay | Admitting: Family Medicine

## 2020-10-18 ENCOUNTER — Other Ambulatory Visit: Payer: Self-pay | Admitting: Family Medicine

## 2020-10-18 DIAGNOSIS — F419 Anxiety disorder, unspecified: Secondary | ICD-10-CM

## 2020-10-18 DIAGNOSIS — F32A Depression, unspecified: Secondary | ICD-10-CM

## 2020-10-18 MED ORDER — HYDROXYZINE HCL 25 MG PO TABS: 25.0000 mg | ORAL_TABLET | Freq: Three times a day (TID) | ORAL | 1 refills | Status: DC | PRN

## 2020-10-18 NOTE — Telephone Encounter (Signed)
Attempted to call pt to schedule appt, no answer, left voicemail.

## 2020-10-19 NOTE — Telephone Encounter (Addendum)
Scheduled pt for next Wednesday 10/26/20 at 3:00 pm

## 2020-10-26 ENCOUNTER — Institutional Professional Consult (permissible substitution): Payer: Medicaid Other | Admitting: Clinical

## 2020-10-27 ENCOUNTER — Ambulatory Visit: Payer: Medicaid Other

## 2020-10-28 ENCOUNTER — Telehealth: Payer: Self-pay | Admitting: Clinical

## 2020-11-03 ENCOUNTER — Encounter (INDEPENDENT_AMBULATORY_CARE_PROVIDER_SITE_OTHER): Payer: Self-pay | Admitting: Clinical

## 2020-11-06 ENCOUNTER — Other Ambulatory Visit: Payer: Self-pay

## 2020-11-06 ENCOUNTER — Ambulatory Visit
Admission: RE | Admit: 2020-11-06 | Discharge: 2020-11-06 | Disposition: A | Discharge status: Home / Self Care | Payer: Medicaid Other | Source: Ambulatory Visit | Attending: Emergency Medicine | Admitting: Emergency Medicine

## 2020-11-06 VITALS — BP 137/90 | HR 96 | Temp 98.2°F | Resp 18

## 2020-11-06 DIAGNOSIS — M62838 Other muscle spasm: Secondary | ICD-10-CM

## 2020-11-06 DIAGNOSIS — S46811A Strain of other muscles, fascia and tendons at shoulder and upper arm level, right arm, initial encounter: Secondary | ICD-10-CM

## 2020-11-06 DIAGNOSIS — M898X3 Other specified disorders of bone, forearm: Secondary | ICD-10-CM

## 2020-11-06 MED ORDER — IBUPROFEN 600 MG PO TABS: 600.0000 mg | ORAL_TABLET | Freq: Four times a day (QID) | ORAL | 0 refills | Status: DC | PRN

## 2020-11-06 MED ORDER — TIZANIDINE HCL 4 MG PO TABS: 4.0000 mg | ORAL_TABLET | Freq: Three times a day (TID) | ORAL | 0 refills | Status: DC | PRN
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
## 2020-11-06 NOTE — Discharge Instructions (Addendum)
600 mg of ibuprofen combined with 1000 mg of Tylenol together 3-4 times a day as needed for pain.  Stop the methocarbamol, try the tizanidine instead.  Ice to your forearm.  We do not have x-ray today.  If you are not getting any better in several days, go to the urgent care where you can have it imaged.

## 2020-11-07 ENCOUNTER — Ambulatory Visit (HOSPITAL_COMMUNITY)
Admission: RE | Admit: 2020-11-07 | Discharge: 2020-11-07 | Disposition: A | Discharge status: Home / Self Care | Payer: Medicaid Other | Source: Ambulatory Visit | Attending: Family Medicine | Admitting: Family Medicine

## 2020-11-07 ENCOUNTER — Encounter (HOSPITAL_COMMUNITY): Payer: Self-pay

## 2020-11-07 ENCOUNTER — Ambulatory Visit (INDEPENDENT_AMBULATORY_CARE_PROVIDER_SITE_OTHER): Payer: Medicaid Other

## 2020-11-07 VITALS — BP 158/100 | HR 84 | Temp 98.2°F | Resp 18

## 2020-11-07 DIAGNOSIS — R030 Elevated blood-pressure reading, without diagnosis of hypertension: Secondary | ICD-10-CM

## 2020-11-07 DIAGNOSIS — M25521 Pain in right elbow: Secondary | ICD-10-CM
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
## 2020-11-07 NOTE — Discharge Instructions (Addendum)
Your blood pressure was noted to be elevated during your visit today. If you are currently taking medication for high blood pressure, please ensure you are taking this as directed. If you do not have a history of high blood pressure and your blood pressure remains persistently elevated, you may need to begin taking a medication at some point. You may return here within the next few days to recheck if unable to see your primary care provider or if you do not have a one.  BP (!) 158/100   Pulse 84   Temp 98.2 F (36.8 C) (Oral)   Resp 18   LMP 04/12/2020   SpO2 97%   BP Readings from Last 3 Encounters:  11/07/20 (!) 158/100  11/06/20 137/90  10/08/20 112/81   For your elbow pain you may try taking the prednisone you have at home: 40mg  daily for 5 days.
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
## 2020-11-07 NOTE — Telephone Encounter (Signed)
Several unsuccessful attempts to reach pt and reschedule. Sent message in pt chart.

## 2020-11-08 ENCOUNTER — Ambulatory Visit (HOSPITAL_COMMUNITY): Payer: Self-pay | Admitting: Clinical

## 2020-11-11 ENCOUNTER — Ambulatory Visit (HOSPITAL_COMMUNITY): Payer: Medicaid Other | Admitting: Psychiatry

## 2020-11-11 ENCOUNTER — Encounter (HOSPITAL_COMMUNITY): Payer: Self-pay | Admitting: Psychiatry

## 2020-11-14 ENCOUNTER — Ambulatory Visit: Payer: Self-pay | Admitting: Family Medicine

## 2020-11-14 ENCOUNTER — Ambulatory Visit: Payer: Medicaid Other | Admitting: Family Medicine

## 2020-11-18 ENCOUNTER — Ambulatory Visit: Payer: Self-pay | Admitting: Sports Medicine

## 2020-11-23 ENCOUNTER — Encounter: Payer: Self-pay | Admitting: Family Medicine

## 2020-11-25 ENCOUNTER — Ambulatory Visit (INDEPENDENT_AMBULATORY_CARE_PROVIDER_SITE_OTHER): Payer: Self-pay | Admitting: Family Medicine

## 2020-11-25 ENCOUNTER — Other Ambulatory Visit: Payer: Self-pay

## 2020-11-25 ENCOUNTER — Ambulatory Visit: Payer: Medicaid Other

## 2020-11-25 ENCOUNTER — Ambulatory Visit: Payer: Self-pay

## 2020-11-25 VITALS — BP 151/83 | Ht 69.0 in | Wt 303.0 lb

## 2020-11-25 DIAGNOSIS — M546 Pain in thoracic spine: Secondary | ICD-10-CM

## 2020-11-25 DIAGNOSIS — M25521 Pain in right elbow: Secondary | ICD-10-CM

## 2020-11-25 DIAGNOSIS — M79601 Pain in right arm: Secondary | ICD-10-CM

## 2020-11-25 MED ORDER — MELOXICAM 15 MG PO TABS: 15.0000 mg | ORAL_TABLET | Freq: Every day | ORAL | 0 refills | Status: DC
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
## 2020-11-25 NOTE — Patient Instructions (Signed)
I reviewed your x-rays from the emergency department and today we did an ultrasound of your right elbow.  You do have some extra fluid in the elbow joint which is indicative of a probable bone contusion.  I do not see a fracture of the radial head.  I am putting you on Mobic for the next 7 to 10 days.  If it starts bothering your stomach, please stop it and give the office a call.  Do not take any over-the-counter Aleve ibuprofen or Motrin with this but you can take over-the-counter Tylenol.  We have given you some exercises for range of motion for the elbow.  These will be mildly uncomfortable but should not be terribly painful.  I would do them for the next 2 weeks or until all your symptoms are gone.  It may take 2 or 3 weeks for this elbow to feel normal.  Also giving you some exercises for you and your daughter to use for the upper back strain you both have.  You do not necessarily need to make a follow-up but if you are not improving as planned, please feel free to call the office and get another appointment with [place].  It was nice to meet both of you.

## 2020-11-29 ENCOUNTER — Ambulatory Visit: Payer: Medicaid Other | Attending: Family Medicine

## 2020-11-29 ENCOUNTER — Other Ambulatory Visit (HOSPITAL_COMMUNITY)
Admission: RE | Admit: 2020-11-29 | Discharge: 2020-11-29 | Disposition: A | Discharge status: Home / Self Care | Payer: Medicaid Other | Source: Ambulatory Visit | Attending: Family Medicine | Admitting: Family Medicine

## 2020-11-29 ENCOUNTER — Other Ambulatory Visit: Payer: Self-pay

## 2020-11-29 DIAGNOSIS — Z113 Encounter for screening for infections with a predominantly sexual mode of transmission: Secondary | ICD-10-CM | POA: Insufficient documentation

## 2020-11-30 LAB — CERVICOVAGINAL ANCILLARY ONLY
Bacterial Vaginitis (gardnerella): NEGATIVE
Candida Glabrata: NEGATIVE
Candida Vaginitis: POSITIVE — AB
Chlamydia: POSITIVE — AB
Comment: NEGATIVE
Comment: NEGATIVE
Comment: NEGATIVE
Comment: NEGATIVE
Comment: NEGATIVE
Comment: NORMAL
Neisseria Gonorrhea: NEGATIVE
Trichomonas: POSITIVE — AB

## 2020-11-30 LAB — HIV ANTIBODY (ROUTINE TESTING W REFLEX): HIV Screen 4th Generation wRfx: NONREACTIVE

## 2020-12-01 ENCOUNTER — Other Ambulatory Visit: Payer: Self-pay

## 2020-12-01 ENCOUNTER — Other Ambulatory Visit: Payer: Self-pay | Admitting: Family Medicine

## 2020-12-01 MED ORDER — DOXYCYCLINE HYCLATE 100 MG PO TABS
ORAL_TABLET | ORAL | 0 refills | Status: DC
  Filled 2020-12-01: qty 14, 7d supply, fill #0

## 2020-12-01 MED ORDER — FLUCONAZOLE 150 MG PO TABS: 150.0000 mg | ORAL_TABLET | Freq: Once | ORAL | 0 refills | Status: AC

## 2020-12-01 MED ORDER — DOXYCYCLINE HYCLATE 100 MG PO TABS: 100.0000 mg | ORAL_TABLET | Freq: Two times a day (BID) | ORAL | 0 refills | Status: DC

## 2020-12-01 MED ORDER — METRONIDAZOLE 500 MG PO TABS
ORAL_TABLET | ORAL | 0 refills | Status: DC
  Filled 2020-12-01: qty 4, 1d supply, fill #0

## 2020-12-01 MED ORDER — METRONIDAZOLE 500 MG PO TABS: 500.0000 mg | ORAL_TABLET | Freq: Two times a day (BID) | ORAL | 0 refills | Status: AC

## 2020-12-07 NOTE — Progress Notes (Signed)
Paperwork has been faxed to [facility].

## 2020-12-09 ENCOUNTER — Ambulatory Visit: Payer: Medicaid Other

## 2020-12-23 ENCOUNTER — Other Ambulatory Visit: Payer: Self-pay

## 2020-12-23 ENCOUNTER — Ambulatory Visit (INDEPENDENT_AMBULATORY_CARE_PROVIDER_SITE_OTHER): Payer: Self-pay | Admitting: Family Medicine

## 2020-12-23 VITALS — Ht 69.0 in | Wt 300.0 lb

## 2020-12-23 DIAGNOSIS — M546 Pain in thoracic spine: Secondary | ICD-10-CM

## 2020-12-23 DIAGNOSIS — M25521 Pain in right elbow: Secondary | ICD-10-CM

## 2020-12-23 DIAGNOSIS — M25561 Pain in right knee: Secondary | ICD-10-CM

## 2020-12-23 DIAGNOSIS — G8929 Other chronic pain: Secondary | ICD-10-CM

## 2020-12-23 MED ORDER — METHYLPREDNISOLONE ACETATE 40 MG/ML IJ SUSP
40.0000 mg | Freq: Once | INTRAMUSCULAR | Status: AC
  Administered 2020-12-23: 40 mg via INTRA_ARTICULAR

## 2020-12-23 NOTE — Assessment & Plan Note (Signed)
Improving, likely secondary to radial head contusion.  Continue anti-inflammatories and icing as needed.  Follow-up in 4 weeks if continued pain.
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
## 2020-12-23 NOTE — Assessment & Plan Note (Addendum)
Given Patient's continued pain and trigger points on examination, discussed risk and benefits of trigger point injection, ultimately she opted to proceed with 2 trigger point injections.  These were performed on her right levator scapula and right rhomboid per below.  She tolerated the procedure well.  She was given further exercises for levator scapula and rhomboid to perform at home.  Can continue meloxicam.  Follow-up in 4 weeks unless pain is resolved.

## 2020-12-23 NOTE — Progress Notes (Signed)
[facility]: Attending Note: I have reviewed the chart, discussed wit the Sports Medicine Fellow. I agree with assessment and treatment plan as detailed in the Fellow's note. Elbow pain has resolved.  I think she had a small radial head contusion on the right.  Glad that is better. Continued problems with scapular area upper back pain.  I do think this is from muscle strain of the levator scapula and rhomboids.  Trigger point injection today.  Added overhead press.  Follow-up 4 weeks if not better.

## 2020-12-23 NOTE — Patient Instructions (Signed)
Thank you for coming to see me today. It was a pleasure. Today we talked about:   Today you received an injection with corticosteroid. This injection is usually done in response to pain and inflammation. There is some "numbing" medicine also in the shot so the injected area may be numb and feel really good for the next couple of hours. The numbing medicine usually wears off in 2-3 hours though, and then your pain level will be right back where it was before the injection.   The actually benefit from the steroid injection is usually noticed in 2-7 days. You may actually experience a small (as in 10%) INCREASE in pain in the first 24 hours---that is common.   Things to watch out for that you should contact us or a health care provider urgently would include: 1. Unusual (as in more than 10%) increase in pain 2. New fever > 101.5 3. New swelling or redness of the injected area.  4. Streaking of red lines around the area injected.   Please follow-up with [place] in 4 weeks if still having pain.  Do the exercises we gave you and you can continue to take meloxicam daily as needed.  If you have any questions or concerns, please do not hesitate to call the office at [phone].  Best,   Luis Abed, DO [facility]

## 2020-12-23 NOTE — Assessment & Plan Note (Signed)
Exam and history are most consistent with patellofemoral syndrome.  We will give her her strengthening exercises for hip abductors.  Can continue anti-inflammatory as needed.  Follow-up in 4 weeks if no improvement.

## 2021-01-03 ENCOUNTER — Encounter: Payer: Self-pay | Admitting: Family Medicine

## 2021-01-03 ENCOUNTER — Telehealth: Payer: Self-pay

## 2021-01-03 NOTE — Telephone Encounter (Signed)
Patient called back for [name] stated that she was bit by a spider and need an appointment ASAP.  please call Ph# [phone]

## 2021-01-03 NOTE — Telephone Encounter (Signed)
Contacted pt to schedule an appt pt didn't answer lvm  ?

## 2021-01-05 NOTE — Telephone Encounter (Signed)
Pt called back to schedule an appt, no openings until October, please advise.

## 2021-01-06 ENCOUNTER — Ambulatory Visit (HOSPITAL_COMMUNITY): Payer: Medicaid Other

## 2021-01-07 ENCOUNTER — Other Ambulatory Visit: Payer: Self-pay

## 2021-01-07 ENCOUNTER — Ambulatory Visit
Admission: RE | Admit: 2021-01-07 | Discharge: 2021-01-07 | Disposition: A | Discharge status: Home / Self Care | Payer: Medicaid Other | Source: Ambulatory Visit | Attending: Emergency Medicine | Admitting: Emergency Medicine

## 2021-01-07 VITALS — BP 156/93 | HR 94 | Temp 98.3°F | Resp 18

## 2021-01-07 DIAGNOSIS — L03116 Cellulitis of left lower limb: Secondary | ICD-10-CM

## 2021-01-07 MED ORDER — DOXYCYCLINE HYCLATE 100 MG PO CAPS: 100.0000 mg | ORAL_CAPSULE | Freq: Two times a day (BID) | ORAL | 0 refills | Status: AC

## 2021-01-07 NOTE — Discharge Instructions (Signed)
Doxycycline twice daily x1 week Warm compresses Tylenol and ibuprofen for pain May use over-the-counter hydrocortisone cream for any itching Follow-up if not improving or worsening

## 2021-01-07 NOTE — ED Triage Notes (Signed)
Pt sts possible bug bite to left upper thigh 6 days ago

## 2021-01-07 NOTE — ED Provider Notes (Signed)
[facility]    CSN: [id number] Arrival date & time: 01/07/21  0954      History   Chief Complaint Chief Complaint  Patient presents with   Appointment    1000   Insect Bite    HPI Ann Moran is a 35 y.o. female presenting today for evaluation of possible bug bite.  Reports area to left upper thigh that she first noticed approximately 6 days ago.  Initially itchy, but over the past 5 days she she has developed pain and discomfort around the area.  She does report fevers earlier in the week.  Reports 1 episode of vomiting after taking leftover amoxicillin which she had from a prior dental infection.  HPI  Past Medical History:  Diagnosis Date   Allergy    Anemia    Anxiety    Chlamydia    Depression    hx of meds, none currently- doing ok   Diabetes mellitus without complication (HCC)    GERD (gastroesophageal reflux disease)    Infection due to trichomonas    MRSA (methicillin resistant staph aureus) culture positive    Dec 2012   Obesity    Pregnancy induced hypertension    Urinary tract infection     Patient Active Problem List   Diagnosis Date Noted   Thoracic back pain 12/23/2020   Right elbow pain 12/23/2020   Chronic pain of right knee 12/23/2020   Ankle fracture, left 08/09/2020   Dysphagia 05/20/2019   Encounter for other preprocedural examination 05/20/2019   PCOS (polycystic ovarian syndrome) 02/24/2019   Neck pain 02/24/2019   Morbid obesity with body mass index (BMI) of 45.0 to 49.9 in adult [facility]) 02/24/2019   Essential hypertension 02/24/2019   Gastroesophageal reflux disease 02/24/2019   Irregular menstrual cycle 01/14/2019   Abnormal uterine bleeding 01/14/2019   Chronic cough 01/14/2019    Past Surgical History:  Procedure Laterality Date   CHOLECYSTECTOMY     ECTOPIC PREGNANCY SURGERY      OB History     Gravida  2   Para  1   Term  0   Preterm  1   AB  1   Living  1      SAB      IAB      Ectopic   1   Multiple      Live Births  1            Home Medications    Prior to Admission medications   Medication Sig Start Date End Date Taking? Authorizing Provider  doxycycline (VIBRAMYCIN) 100 MG capsule Take 1 capsule (100 mg total) by mouth 2 (two) times daily for 7 days. 01/07/21 01/14/21 Yes Stewart Pimenta C, PA-C  albuterol (VENTOLIN HFA) 108 (90 Base) MCG/ACT inhaler Inhale 2 puffs into the lungs every 4 (four) hours as needed for wheezing or shortness of breath. 08/09/20   Storm Frisk, MD  amLODipine (NORVASC) 10 MG tablet Take 10 mg by mouth daily.    [provider]  cetirizine (ZYRTEC) 10 MG tablet TAKE 1 TABLET(10 MG) BY MOUTH DAILY 07/05/20   Hoy Register, MD  FLUoxetine (PROZAC) 20 MG tablet Take 1 tablet (20 mg total) by mouth daily. 09/12/20   Hoy Register, MD  fluticasone (FLONASE) 50 MCG/ACT nasal spray SHAKE LIQUID AND USE 1 TO 2 SPRAYS IN EACH NOSTRIL DAILY 08/10/20   Hoy Register, MD  glyBURIDE (DIABETA) 5 MG tablet Take 1 tablet (5 mg total)  by mouth daily with breakfast. 03/14/20   Sharyon Cable, CNM  hydrOXYzine (ATARAX/VISTARIL) 25 MG tablet Take 1 tablet (25 mg total) by mouth every 8 (eight) hours as needed for anxiety. 10/18/20   Hoy Register, MD  meloxicam (MOBIC) 15 MG tablet Take 1 tablet (15 mg total) by mouth daily. 11/25/20   Nestor Ramp, MD  metroNIDAZOLE (FLAGYL) 500 MG tablet Take 4 tablets by mouth just once. 12/01/20   Hoy Register, MD  omeprazole (PRILOSEC) 20 MG capsule Take 1 capsule (20 mg total) by mouth daily. 07/20/20   Georgetta Haber, NP  tiZANidine (ZANAFLEX) 4 MG tablet Take 1 tablet (4 mg total) by mouth every 8 (eight) hours as needed for muscle spasms. 11/06/20   Domenick Gong, MD  citalopram (CELEXA) 20 MG tablet Take 1 tablet (20 mg total) by mouth daily. Patient not taking: Reported on 03/14/2020 10/19/19 03/23/20  Hoy Register, MD  medroxyPROGESTERone (PROVERA) 5 MG tablet Take 2 tablets (10 mg total)  by mouth daily. Patient not taking: Reported on 07/20/2018 06/30/18 12/06/18  Geoffery Lyons, MD    Family History Family History  Problem Relation Age of Onset   Hypertension Mother    Diabetes Mother    Asthma Father    Hypertension Father    Stroke Maternal Grandmother    Diabetes Paternal Grandmother    Hypertension Paternal Grandmother    Colon cancer Paternal Grandmother    Colon cancer Paternal Aunt    Esophageal cancer Neg Hx    Stomach cancer Neg Hx    Rectal cancer Neg Hx     Social History Social History   Tobacco Use   Smoking status: Every Day    Packs/day: 0.50    Years: 9.00    Pack years: 4.50    Types: Cigarettes   Smokeless tobacco: Never  Vaping Use   Vaping Use: Some days   Last attempt to quit: 08/18/2015   Devices: "rarely"  Substance Use Topics   Alcohol use: Yes   Drug use: No     Allergies   Patient has no known allergies.   Review of Systems Review of Systems  Constitutional:  Negative for fatigue and fever.  HENT:  Negative for mouth sores.   Eyes:  Negative for visual disturbance.  Respiratory:  Negative for shortness of breath.   Cardiovascular:  Negative for chest pain.  Gastrointestinal:  Negative for abdominal pain, nausea and vomiting.  Genitourinary:  Negative for genital sores.  Musculoskeletal:  Negative for arthralgias and joint swelling.  Skin:  Positive for color change. Negative for rash and wound.  Neurological:  Negative for dizziness, weakness, light-headedness and headaches.    Physical Exam Triage Vital Signs ED Triage Vitals [01/07/21 1007]  Enc Vitals Group     BP (!) 156/93     Pulse Rate 94     Resp 18     Temp 98.3 F (36.8 C)     Temp Source Oral     SpO2 97 %     Weight      Height      Head Circumference      Peak Flow      Pain Score 4     Pain Loc      Pain Edu?      Excl. in GC?    No data found.  Updated Vital Signs BP (!) 156/93 (BP Location: Left Arm)   Pulse 94   Temp 98.3 F  (36.8 C) (  Oral)   Resp 18   SpO2 97%   Visual Acuity Right Eye Distance:   Left Eye Distance:   Bilateral Distance:    Right Eye Near:   Left Eye Near:    Bilateral Near:     Physical Exam Vitals and nursing note reviewed.  Constitutional:      Appearance: She is well-developed.     Comments: No acute distress  HENT:     Head: Normocephalic and atraumatic.     Nose: Nose normal.  Eyes:     Conjunctiva/sclera: Conjunctivae normal.  Cardiovascular:     Rate and Rhythm: Normal rate.  Pulmonary:     Effort: Pulmonary effort is normal. No respiratory distress.  Abdominal:     General: There is no distension.  Musculoskeletal:        General: Normal range of motion.     Cervical back: Neck supple.  Skin:    General: Skin is warm and dry.     Comments: Lateral aspect of left proximal thigh with scabbing with surrounding erythema and tenderness, no obvious induration or fluctuance  Neurological:     Mental Status: She is alert and oriented to person, place, and time.     UC Treatments / Results  Labs (all labs ordered are listed, but only abnormal results are displayed) Labs Reviewed - No data to display  EKG   Radiology No results found.  Procedures Procedures (including critical care time)  Medications Ordered in UC Medications - No data to display  Initial Impression / Assessment and Plan / UC Course  I have reviewed the triage vital signs and the nursing notes.  Pertinent labs & imaging results that were available during my care of the patient were reviewed by me and considered in my medical decision making (see chart for details).    Left leg cellulitis, suspect likely staph infection, no signs of obvious abscess, initiated on doxycycline, warm compresses anti-inflammatories and monitoring.  Discussed strict return precautions. Patient verbalized understanding and is agreeable with plan.  Final Clinical Impressions(s) / UC Diagnoses   Final  diagnoses:  Cellulitis of leg, left     Discharge Instructions      Doxycycline twice daily x1 week Warm compresses Tylenol and ibuprofen for pain May use over-the-counter hydrocortisone cream for any itching Follow-up if not improving or worsening     ED Prescriptions     Medication Sig Dispense Auth. Provider   doxycycline (VIBRAMYCIN) 100 MG capsule Take 1 capsule (100 mg total) by mouth 2 (two) times daily for 7 days. 14 capsule Finley Dinkel, [place] C, PA-C      PDMP not reviewed this encounter.   Lew Dawes, PA-C 01/07/21 1053

## 2021-01-15 ENCOUNTER — Telehealth: Payer: Medicaid Other

## 2021-01-19 ENCOUNTER — Telehealth: Payer: Medicaid Other | Admitting: Physician Assistant

## 2021-01-19 DIAGNOSIS — B9689 Other specified bacterial agents as the cause of diseases classified elsewhere: Secondary | ICD-10-CM

## 2021-01-19 DIAGNOSIS — J019 Acute sinusitis, unspecified: Secondary | ICD-10-CM

## 2021-01-19 MED ORDER — FLUTICASONE PROPIONATE 50 MCG/ACT NA SUSP: 2.0000 | Freq: Every day | NASAL | 0 refills | Status: DC

## 2021-01-19 MED ORDER — BENZONATATE 100 MG PO CAPS: 100.0000 mg | ORAL_CAPSULE | Freq: Three times a day (TID) | ORAL | 0 refills | Status: DC | PRN

## 2021-01-19 MED ORDER — AMOXICILLIN-POT CLAVULANATE 875-125 MG PO TABS: 1.0000 | ORAL_TABLET | Freq: Two times a day (BID) | ORAL | 0 refills | Status: DC

## 2021-01-19 NOTE — Patient Instructions (Signed)
Ann Moran, thank you for joining [name], PA-C for today's virtual visit.  While this provider is not your primary care provider (PCP), if your PCP is located in our provider database this encounter information will be shared with them immediately following your visit.  Consent: (Patient) Ann Moran provided verbal consent for this virtual visit at the beginning of the encounter.  Current Medications:  Current Outpatient Medications:    albuterol (VENTOLIN HFA) 108 (90 Base) MCG/ACT inhaler, Inhale 2 puffs into the lungs every 4 (four) hours as needed for wheezing or shortness of breath., Disp: 18 g, Rfl: 0   amLODipine (NORVASC) 10 MG tablet, Take 10 mg by mouth daily., Disp: , Rfl:    cetirizine (ZYRTEC) 10 MG tablet, TAKE 1 TABLET(10 MG) BY MOUTH DAILY, Disp: 90 tablet, Rfl: 0   FLUoxetine (PROZAC) 20 MG tablet, Take 1 tablet (20 mg total) by mouth daily., Disp: 30 tablet, Rfl: 3   fluticasone (FLONASE) 50 MCG/ACT nasal spray, SHAKE LIQUID AND USE 1 TO 2 SPRAYS IN EACH NOSTRIL DAILY, Disp: 16 g, Rfl: 0   glyBURIDE (DIABETA) 5 MG tablet, Take 1 tablet (5 mg total) by mouth daily with breakfast., Disp: 60 tablet, Rfl: 5   hydrOXYzine (ATARAX/VISTARIL) 25 MG tablet, Take 1 tablet (25 mg total) by mouth every 8 (eight) hours as needed for anxiety., Disp: 60 tablet, Rfl: 1   meloxicam (MOBIC) 15 MG tablet, Take 1 tablet (15 mg total) by mouth daily., Disp: 15 tablet, Rfl: 0   metroNIDAZOLE (FLAGYL) 500 MG tablet, Take 4 tablets by mouth just once., Disp: 4 tablet, Rfl: 0   omeprazole (PRILOSEC) 20 MG capsule, Take 1 capsule (20 mg total) by mouth daily., Disp: 30 capsule, Rfl: 0   tiZANidine (ZANAFLEX) 4 MG tablet, Take 1 tablet (4 mg total) by mouth every 8 (eight) hours as needed for muscle spasms., Disp: 30 tablet, Rfl: 0   Medications ordered in this encounter:  No orders of the defined types were placed in this encounter.    *If you need refills on other  medications prior to your next appointment, please contact your pharmacy*  Follow-Up: Call back or seek an in-person evaluation if the symptoms worsen or if the condition fails to improve as anticipated.  Other Instructions Please take antibiotic as directed.  Increase fluid intake.  Use Saline nasal spray.  Take a daily multivitamin. Use the Flonase and Tessalon as directed. Start OTC Mucinex.  Place a humidifier in the bedroom.  Please call or return clinic if symptoms are not improving.  Sinusitis Sinusitis is redness, soreness, and swelling (inflammation) of the paranasal sinuses. Paranasal sinuses are air pockets within the bones of your face (beneath the eyes, the middle of the forehead, or above the eyes). In healthy paranasal sinuses, mucus is able to drain out, and air is able to circulate through them by way of your nose. However, when your paranasal sinuses are inflamed, mucus and air can become trapped. This can allow bacteria and other germs to grow and cause infection. Sinusitis can develop quickly and last only a short time (acute) or continue over a long period (chronic). Sinusitis that lasts for more than 12 weeks is considered chronic.  CAUSES  Causes of sinusitis include: Allergies. Structural abnormalities, such as displacement of the cartilage that separates your nostrils (deviated septum), which can decrease the air flow through your nose and sinuses and affect sinus drainage. Functional abnormalities, such as when the small hairs (cilia)  that line your sinuses and help remove mucus do not work properly or are not present. SYMPTOMS  Symptoms of acute and chronic sinusitis are the same. The primary symptoms are pain and pressure around the affected sinuses. Other symptoms include: Upper toothache. Earache. Headache. Bad breath. Decreased sense of smell and taste. A cough, which worsens when you are lying flat. Fatigue. Fever. Thick drainage from your nose, which often  is green and may contain pus (purulent). Swelling and warmth over the affected sinuses. DIAGNOSIS  Your caregiver will perform a physical exam. During the exam, your caregiver may: Look in your nose for signs of abnormal growths in your nostrils (nasal polyps). Tap over the affected sinus to check for signs of infection. View the inside of your sinuses (endoscopy) with a special imaging device with a light attached (endoscope), which is inserted into your sinuses. If your caregiver suspects that you have chronic sinusitis, one or more of the following tests may be recommended: Allergy tests. Nasal culture A sample of mucus is taken from your nose and sent to a lab and screened for bacteria. Nasal cytology A sample of mucus is taken from your nose and examined by your caregiver to determine if your sinusitis is related to an allergy. TREATMENT  Most cases of acute sinusitis are related to a viral infection and will resolve on their own within 10 days. Sometimes medicines are prescribed to help relieve symptoms (pain medicine, decongestants, nasal steroid sprays, or saline sprays).  However, for sinusitis related to a bacterial infection, your caregiver will prescribe antibiotic medicines. These are medicines that will help kill the bacteria causing the infection.  Rarely, sinusitis is caused by a fungal infection. In theses cases, your caregiver will prescribe antifungal medicine. For some cases of chronic sinusitis, surgery is needed. Generally, these are cases in which sinusitis recurs more than 3 times per year, despite other treatments. HOME CARE INSTRUCTIONS  Drink plenty of water. Water helps thin the mucus so your sinuses can drain more easily. Use a humidifier. Inhale steam 3 to 4 times a day (for example, sit in the bathroom with the shower running). Apply a warm, moist washcloth to your face 3 to 4 times a day, or as directed by your caregiver. Use saline nasal sprays to help moisten  and clean your sinuses. Take over-the-counter or prescription medicines for pain, discomfort, or fever only as directed by your caregiver. SEEK IMMEDIATE MEDICAL CARE IF: You have increasing pain or severe headaches. You have nausea, vomiting, or drowsiness. You have swelling around your face. You have vision problems. You have a stiff neck. You have difficulty breathing. MAKE SURE YOU:  Understand these instructions. Will watch your condition. Will get help right away if you are not doing well or get worse. Document Released: 04/09/2005 Document Revised: 07/02/2011 Document Reviewed: 04/24/2011 [facility] Patient Information 2014 [place], [place].    If you have been instructed to have an in-person evaluation today at a local Urgent Care facility, please use the link below. It will take you to a list of all of our available [facility], including address, phone number and hours of operation. Please do not delay care.  [facility]  If you or a family member do not have a primary care provider, use the link below to schedule a visit and establish care. When you choose a  primary care physician or advanced practice provider, you gain a long-term partner in health. Find a Primary Care  Provider  Learn more about [place]'s in-office and virtual care options: [facility]

## 2021-01-19 NOTE — Progress Notes (Signed)
Virtual Visit Consent   Ann Moran, you are scheduled for a virtual visit with a [place] provider today.     Just as with appointments in the office, your consent must be obtained to participate.  Your consent will be active for this visit and any virtual visit you may have with one of our providers in the next 365 days.     If you have a MyChart account, a copy of this consent can be sent to you electronically.  All virtual visits are billed to your insurance company just like a traditional visit in the office.    As this is a virtual visit, video technology does not allow for your provider to perform a traditional examination.  This may limit your provider's ability to fully assess your condition.  If your provider identifies any concerns that need to be evaluated in person or the need to arrange testing (such as labs, EKG, etc.), we will make arrangements to do so.     Although advances in technology are sophisticated, we cannot ensure that it will always work on either your end or our end.  If the connection with a video visit is poor, the visit may have to be switched to a telephone visit.  With either a video or telephone visit, we are not always able to ensure that we have a secure connection.     I need to obtain your verbal consent now.   Are you willing to proceed with your visit today?    Ann Moran has provided verbal consent on 01/19/2021 for a virtual visit (video or telephone).   [name], [place]   Date: 01/19/2021 7:54 AM   Virtual Visit via Video Note   I, [name], connected with  Ann Moran  ([id number], [birth year]) on 01/19/21 at  7:45 AM EDT by a video-enabled telemedicine application and verified that I am speaking with the correct person using two identifiers.  Location: Patient: Virtual Visit Location Patient: Home Provider: Virtual Visit Location Provider: Home Office   I discussed the limitations of evaluation and management  by telemedicine and the availability of in person appointments. The patient expressed understanding and agreed to proceed.    History of Present Illness: Ann Moran is a 35 y.o. who identifies as a female who was assigned female at birth, and is being seen today for 2 weeks of URI symptoms including head and chest congestion with ongoing cough, now with increase sputum production and colored phlegm. Denies SOB. Denies chest pain. Has noted right sided maxillary/tooth pain. Denies recent travel. Daughter was sick a couple of weeks ago. No + COVID tests.   HPI: HPI  Problems:  Patient Active Problem List   Diagnosis Date Noted   Thoracic back pain 12/23/2020   Right elbow pain 12/23/2020   Chronic pain of right knee 12/23/2020   Ankle fracture, left 08/09/2020   Dysphagia 05/20/2019   Encounter for other preprocedural examination 05/20/2019   PCOS (polycystic ovarian syndrome) 02/24/2019   Neck pain 02/24/2019   Morbid obesity with body mass index (BMI) of 45.0 to 49.9 in adult [facility]) 02/24/2019   Essential hypertension 02/24/2019   Gastroesophageal reflux disease 02/24/2019   Irregular menstrual cycle 01/14/2019   Abnormal uterine bleeding 01/14/2019   Chronic cough 01/14/2019    Allergies: No Known Allergies Medications:  Current Outpatient Medications:    amoxicillin-clavulanate (AUGMENTIN) 875-125 MG tablet, Take 1 tablet by mouth 2 (two) times daily., Disp:  14 tablet, Rfl: 0   benzonatate (TESSALON) 100 MG capsule, Take 1 capsule (100 mg total) by mouth 3 (three) times daily as needed for cough., Disp: 30 capsule, Rfl: 0   fluticasone (FLONASE) 50 MCG/ACT nasal spray, Place 2 sprays into both nostrils daily., Disp: 16 g, Rfl: 0   albuterol (VENTOLIN HFA) 108 (90 Base) MCG/ACT inhaler, Inhale 2 puffs into the lungs every 4 (four) hours as needed for wheezing or shortness of breath., Disp: 18 g, Rfl: 0   cetirizine (ZYRTEC) 10 MG tablet, TAKE 1 TABLET(10 MG) BY MOUTH DAILY, Disp:  90 tablet, Rfl: 0   FLUoxetine (PROZAC) 20 MG tablet, Take 1 tablet (20 mg total) by mouth daily., Disp: 30 tablet, Rfl: 3   hydrOXYzine (ATARAX/VISTARIL) 25 MG tablet, Take 1 tablet (25 mg total) by mouth every 8 (eight) hours as needed for anxiety., Disp: 60 tablet, Rfl: 1  Observations/Objective: Patient is well-developed, well-nourished in no acute distress.  Resting comfortably at home.  Head is normocephalic, atraumatic.  No labored breathing. Speech is clear and coherent with logical content.  Patient is alert and oriented at baseline.   Assessment and Plan: 1. Acute bacterial sinusitis - benzonatate (TESSALON) 100 MG capsule; Take 1 capsule (100 mg total) by mouth 3 (three) times daily as needed for cough.  Dispense: 30 capsule; Refill: 0 - amoxicillin-clavulanate (AUGMENTIN) 875-125 MG tablet; Take 1 tablet by mouth 2 (two) times daily.  Dispense: 14 tablet; Refill: 0 Rx Augmentin.  Increase fluids.  Rest.  Saline nasal spray.  Probiotic.  Mucinex as directed.  Humidifier in bedroom. Flonase per orders. Tessalon per orders.  Call or return to clinic if symptoms are not improving.   Follow Up Instructions: I discussed the assessment and treatment plan with the patient. The patient was provided an opportunity to ask questions and all were answered. The patient agreed with the plan and demonstrated an understanding of the instructions.  A copy of instructions were sent to the patient via MyChart unless otherwise noted below.   The patient was advised to call back or seek an in-person evaluation if the symptoms worsen or if the condition fails to improve as anticipated.  Time:  I spent 10 minutes with the patient via telehealth technology discussing the above problems/concerns.    [name], PA-C

## 2021-01-27 ENCOUNTER — Ambulatory Visit (INDEPENDENT_AMBULATORY_CARE_PROVIDER_SITE_OTHER): Payer: Medicaid Other | Admitting: Family Medicine

## 2021-01-27 VITALS — Ht 69.0 in | Wt 300.0 lb

## 2021-01-27 DIAGNOSIS — M7652 Patellar tendinitis, left knee: Secondary | ICD-10-CM | POA: Diagnosis not present

## 2021-01-27 DIAGNOSIS — M7651 Patellar tendinitis, right knee: Secondary | ICD-10-CM | POA: Diagnosis not present

## 2021-01-27 DIAGNOSIS — M25571 Pain in right ankle and joints of right foot: Secondary | ICD-10-CM | POA: Insufficient documentation

## 2021-01-27 DIAGNOSIS — M546 Pain in thoracic spine: Secondary | ICD-10-CM | POA: Diagnosis not present

## 2021-01-27 NOTE — Assessment & Plan Note (Signed)
I suspect this is a severe sprain on the right ankle.  We will have her stop and get x-rays on the way home.  We will place her in a Aircast weightbearing as tolerated and have her follow-up in 2 weeks.  At that time we will start aggressive rehabilitation.  I will call her if we find something on the x-rays that we do not expect.

## 2021-01-27 NOTE — Assessment & Plan Note (Signed)
We discussed this.  He does not have a thing to do with fluid on the knee and in fact she does not have an effusion on either knee.  I would like to address this in the future when she has recovered from her current ankle issues because she would not be able to do the exercise program necessary for this.  She will remind me at the next office visit we will discuss this and give her some rehabilitation exercises.  In the interim, icing is always good for pain.

## 2021-01-27 NOTE — Assessment & Plan Note (Signed)
We are both pleased that she is having improvement in thoracic back pain.  I would increase the number of time she does exercises to 3-4 times a day and follow this long-term.

## 2021-01-27 NOTE — Patient Instructions (Signed)
Please get the x-rays of your ankle.  We will go over the films when I see you back in 2 weeks.  If there is something unexpected on, like Asiyah fracture that I did not think you had, then I will call you otherwise we will just look at them when you come back.  I am putting you in an Aircast which I want you to wear most of the time when you are up and moving around.  Try to keep the ankle elevated all other times.  I would recommend you ice it twice a day for 15 to 20 minutes at a time.  Once we see you back, we will start you on a rehabilitation program for that ankle.  I am glad the exercises are helping your upper back.  I think I would try to do them 3-4 even 5 times a day if you have the capacity to do that.  The more you do them the better your upper back will be.  Great to see you!

## 2021-01-27 NOTE — Progress Notes (Signed)
  Ann Moran - 35 y.o. female MRN [id number]  Date of birth: 1985/11/20    SUBJECTIVE:      Chief Complaint:/ HPI:  #1.  Right ankle pain.  She tripped and twisted her ankle.  This occurred several days ago.  She has not been able to bear much weight since then.  She has had a lot of swelling. #2.  Regarding her thoracic back pain, it is much improved when she does the exercises.  She says the exercises are actually quite a relief to do she is occasionally been doing them more often than twice a day.  In between exercise episodes however she still can have problems with this. 3.  New issue of bilateral knee pain especially when climbing stairs.  She has had some history of "fluid on the knee" and she is concerned that that is what is causing her issues today with the knee pain.      OBJECTIVE: Ht 5\' 9"  (1.753 m)   Wt 300 lb (136.1 kg)   BMI 44.30 kg/m   Physical Exam:  Vital signs are reviewed. GENERAL: Well-developed female no acute distress Knees: Symmetrical.  No effusion.  Ligamentously intact to varus and valgus stress.  There is no joint line tenderness on either knee.  She does have some tenderness to palpation over the midportion of the patellar tendon bilaterally and this reproduces her pain. ANKLE: Right.  Soft tissue swelling anterior laterally.  She is tender over the inferior retinaculum and over the general area of the peroneal tendon.  There is no defect noted in the tendons.  She has intact strength plantar flexion dorsiflexion eversion and inversion.  Squeeze test of the lower leg is negative for syndesmotic pain.  He is neurovascularly intact in the foot.  ASSESSMENT & PLAN:  See problem based charting & AVS for pt instructions. Pain in joint involving right ankle and foot I suspect this is a severe sprain on the right ankle.  We will have her stop and get x-rays on the way home.  We will place her in a Aircast weightbearing as tolerated and have her follow-up in 2  weeks.  At that time we will start aggressive rehabilitation.  I will call her if we find something on the x-rays that we do not expect.  Patellar tendinitis of both knees We discussed this.  He does not have a thing to do with fluid on the knee and in fact she does not have an effusion on either knee.  I would like to address this in the future when she has recovered from her current ankle issues because she would not be able to do the exercise program necessary for this.  She will remind me at the next office visit we will discuss this and give her some rehabilitation exercises.  In the interim, icing is always good for pain.  Thoracic back pain We are both pleased that she is having improvement in thoracic back pain.  I would increase the number of time she does exercises to 3-4 times a day and follow this long-term.

## 2021-01-30 ENCOUNTER — Other Ambulatory Visit: Payer: Self-pay

## 2021-01-30 ENCOUNTER — Ambulatory Visit
Admission: RE | Admit: 2021-01-30 | Discharge: 2021-01-30 | Disposition: A | Discharge status: Home / Self Care | Payer: Medicaid Other | Source: Ambulatory Visit | Attending: Family Medicine | Admitting: Family Medicine

## 2021-01-30 DIAGNOSIS — M25571 Pain in right ankle and joints of right foot: Secondary | ICD-10-CM

## 2021-01-31 ENCOUNTER — Ambulatory Visit: Payer: Medicaid Other | Admitting: Physician Assistant

## 2021-02-06 ENCOUNTER — Ambulatory Visit
Admission: RE | Admit: 2021-02-06 | Discharge: 2021-02-06 | Disposition: A | Discharge status: Home / Self Care | Payer: Medicaid Other | Source: Ambulatory Visit | Attending: Internal Medicine | Admitting: Internal Medicine

## 2021-02-06 ENCOUNTER — Other Ambulatory Visit: Payer: Self-pay

## 2021-02-06 VITALS — BP 145/91 | HR 89 | Temp 98.1°F | Resp 16

## 2021-02-06 DIAGNOSIS — J029 Acute pharyngitis, unspecified: Secondary | ICD-10-CM | POA: Diagnosis not present

## 2021-02-06 DIAGNOSIS — Z20822 Contact with and (suspected) exposure to covid-19: Secondary | ICD-10-CM | POA: Diagnosis present

## 2021-02-06 DIAGNOSIS — M25571 Pain in right ankle and joints of right foot: Secondary | ICD-10-CM | POA: Insufficient documentation

## 2021-02-06 DIAGNOSIS — L02412 Cutaneous abscess of left axilla: Secondary | ICD-10-CM | POA: Insufficient documentation

## 2021-02-06 LAB — POCT RAPID STREP A (OFFICE): Rapid Strep A Screen: NEGATIVE

## 2021-02-06 MED ORDER — CLINDAMYCIN HCL 150 MG PO CAPS: 300.0000 mg | ORAL_CAPSULE | Freq: Three times a day (TID) | ORAL | 0 refills | Status: AC

## 2021-02-06 NOTE — Discharge Instructions (Addendum)
Your rapid strep test was negative.  Your throat culture and COVID-19 viral swab are pending.  We will call if they are positive.  You are being treated with clindamycin antibiotic to help treat abscess of the left armpit.  Please also use warm compresses multiple times daily.  Follow-up with primary care or urgent care if armpit symptoms persist.  Please also take daily bowel probiotic.  Follow-up with orthopedist for right ankle pain.

## 2021-02-06 NOTE — ED Provider Notes (Addendum)
[facility]    CSN: [id number] Arrival date & time: 02/06/21  0904      History   Chief Complaint Chief Complaint  Patient presents with   Mass   Sore Throat   Ankle Pain   Appt 9am    HPI Ann Moran is a 35 y.o. female.   Patient comes in today with multiple complaints.  Patient reports sore throat and painful swallowing x2 days.  Denies any nasal congestion, runny nose, cough, fever, nausea, vomiting, diarrhea, chest pain, shortness of breath.  Daughter had similar symptoms recently.  Patient has not yet taken any medications to help alleviate sore throat.  Patient also reporting a "lump" in her left armpit that started yesterday.  Lump is painful.  Denies any drainage from the lump.  Patient reports that this has not occurred before.  Patient does have history of MRSA to her leg a few weeks prior.  Denies any breast pain or any symptoms of the breast.  Patient also reporting right ankle "stiffness" that has been present since an ankle injury 1 week prior.  Patient was seen at orthopedist and an Aircast was applied that she has been wearing ever since.  X-ray was completed showing no acute bony abnormalities or dislocations.  Patient was to follow-up with orthopedist 2 weeks after initial visit but has not yet done so.  Denies any numbness or tingling to lower extremity.  Is able to bear weight.   Sore Throat  Ankle Pain  Past Medical History:  Diagnosis Date   Allergy    Anemia    Anxiety    Chlamydia    Depression    hx of meds, none currently- doing ok   Diabetes mellitus without complication (HCC)    GERD (gastroesophageal reflux disease)    Infection due to trichomonas    MRSA (methicillin resistant staph aureus) culture positive    Dec 2012   Obesity    Pregnancy induced hypertension    Urinary tract infection     Patient Active Problem List   Diagnosis Date Noted   Patellar tendinitis of both knees 01/27/2021   Pain in joint involving  right ankle and foot 01/27/2021   Thoracic back pain 12/23/2020   Right elbow pain 12/23/2020   Chronic pain of right knee 12/23/2020   Ankle fracture, left 08/09/2020   Dysphagia 05/20/2019   Encounter for other preprocedural examination 05/20/2019   PCOS (polycystic ovarian syndrome) 02/24/2019   Neck pain 02/24/2019   Morbid obesity with body mass index (BMI) of 45.0 to 49.9 in adult [facility]) 02/24/2019   Essential hypertension 02/24/2019   Gastroesophageal reflux disease 02/24/2019   Irregular menstrual cycle 01/14/2019   Abnormal uterine bleeding 01/14/2019   Chronic cough 01/14/2019    Past Surgical History:  Procedure Laterality Date   CHOLECYSTECTOMY     ECTOPIC PREGNANCY SURGERY      OB History     Gravida  2   Para  1   Term  0   Preterm  1   AB  1   Living  1      SAB      IAB      Ectopic  1   Multiple      Live Births  1            Home Medications    Prior to Admission medications   Medication Sig Start Date End Date Taking? Authorizing Provider  clindamycin (CLEOCIN) 150 MG  capsule Take 2 capsules (300 mg total) by mouth 3 (three) times daily for 10 days. 02/06/21 02/16/21 Yes Lance Muss, FNP  albuterol (VENTOLIN HFA) 108 (90 Base) MCG/ACT inhaler Inhale 2 puffs into the lungs every 4 (four) hours as needed for wheezing or shortness of breath. 08/09/20   Storm Frisk, MD  benzonatate (TESSALON) 100 MG capsule Take 1 capsule (100 mg total) by mouth 3 (three) times daily as needed for cough. 01/19/21   Waldon Merl, PA-C  cetirizine (ZYRTEC) 10 MG tablet TAKE 1 TABLET(10 MG) BY MOUTH DAILY 07/05/20   Hoy Register, MD  FLUoxetine (PROZAC) 20 MG tablet Take 1 tablet (20 mg total) by mouth daily. 09/12/20   Hoy Register, MD  fluticasone (FLONASE) 50 MCG/ACT nasal spray Place 2 sprays into both nostrils daily. 01/19/21   Waldon Merl, PA-C  hydrOXYzine (ATARAX/VISTARIL) 25 MG tablet Take 1 tablet (25 mg total) by mouth every  8 (eight) hours as needed for anxiety. 10/18/20   Hoy Register, MD  citalopram (CELEXA) 20 MG tablet Take 1 tablet (20 mg total) by mouth daily. Patient not taking: Reported on 03/14/2020 10/19/19 03/23/20  Hoy Register, MD  medroxyPROGESTERone (PROVERA) 5 MG tablet Take 2 tablets (10 mg total) by mouth daily. Patient not taking: Reported on 07/20/2018 06/30/18 12/06/18  Geoffery Lyons, MD    Family History Family History  Problem Relation Age of Onset   Hypertension Mother    Diabetes Mother    Asthma Father    Hypertension Father    Stroke Maternal Grandmother    Diabetes Paternal Grandmother    Hypertension Paternal Grandmother    Colon cancer Paternal Grandmother    Colon cancer Paternal Aunt    Esophageal cancer Neg Hx    Stomach cancer Neg Hx    Rectal cancer Neg Hx     Social History Social History   Tobacco Use   Smoking status: Every Day    Packs/day: 0.50    Years: 9.00    Pack years: 4.50    Types: Cigarettes   Smokeless tobacco: Never  Vaping Use   Vaping Use: Some days   Last attempt to quit: 08/18/2015   Devices: "rarely"  Substance Use Topics   Alcohol use: Yes   Drug use: No     Allergies   Patient has no known allergies.   Review of Systems Review of Systems Per HPI  Physical Exam Triage Vital Signs ED Triage Vitals [02/06/21 0915]  Enc Vitals Group     BP (!) 145/91     Pulse Rate 89     Resp 16     Temp 98.1 F (36.7 C)     Temp Source Oral     SpO2 94 %     Weight      Height      Head Circumference      Peak Flow      Pain Score 8     Pain Loc      Pain Edu?      Excl. in GC?    No data found.  Updated Vital Signs BP (!) 145/91 (BP Location: Left Arm)   Pulse 89   Temp 98.1 F (36.7 C) (Oral)   Resp 16   SpO2 94% Comment: Has thick acyrillic nails  Visual Acuity Right Eye Distance:   Left Eye Distance:   Bilateral Distance:    Right Eye Near:   Left Eye Near:    Bilateral Near:  Physical  Exam Constitutional:      General: She is not in acute distress.    Appearance: Normal appearance. She is not toxic-appearing or diaphoretic.  HENT:     Head: Normocephalic and atraumatic.     Right Ear: Tympanic membrane and ear canal normal.     Left Ear: Tympanic membrane and ear canal normal.     Nose: Nose normal.     Mouth/Throat:     Mouth: Mucous membranes are moist.     Pharynx: Posterior oropharyngeal erythema present.  Eyes:     Extraocular Movements: Extraocular movements intact.     Conjunctiva/sclera: Conjunctivae normal.     Pupils: Pupils are equal, round, and reactive to light.  Cardiovascular:     Rate and Rhythm: Normal rate and regular rhythm.     Pulses: Normal pulses.     Heart sounds: Normal heart sounds.  Pulmonary:     Effort: Pulmonary effort is normal.     Breath sounds: Normal breath sounds.  Abdominal:     General: Abdomen is flat. Bowel sounds are normal. There is no distension.     Palpations: Abdomen is soft.     Tenderness: There is no abdominal tenderness.  Musculoskeletal:     Right ankle: No swelling or deformity. No tenderness. Normal range of motion. Normal pulse.     Left ankle: Normal.     Comments: Air cast is in place.  Neurovascular intact.  No tenderness to palpation.  Lymphadenopathy:     Upper Body:     Left upper body: No axillary adenopathy.  Skin:    General: Skin is warm and dry.     Findings: Abscess present.     Comments: Approximately 3 cm x 3 cm indurated abscess present to left axilla.  Felt by palpation.  No drainage noted.  No erythema noted.  Neurological:     General: No focal deficit present.     Mental Status: She is alert and oriented to person, place, and time. Mental status is at baseline.  Psychiatric:        Mood and Affect: Mood normal.        Behavior: Behavior normal.        Thought Content: Thought content normal.        Judgment: Judgment normal.     UC Treatments / Results  Labs (all labs  ordered are listed, but only abnormal results are displayed) Labs Reviewed  CULTURE, GROUP A STREP ([facility])  NOVEL CORONAVIRUS, NAA  POCT RAPID STREP A (OFFICE)    EKG   Radiology No results found.  Procedures Procedures (including critical care time)  Medications Ordered in UC Medications - No data to display  Initial Impression / Assessment and Plan / UC Course  I have reviewed the triage vital signs and the nursing notes.  Pertinent labs & imaging results that were available during my care of the patient were reviewed by me and considered in my medical decision making (see chart for details).  Clinical Course as of 02/06/21 1058  Mon Feb 06, 2021  0917 SpO2: 94 % [HF]    Clinical Course User Index [HF] Lance Muss, FNP    It appears that patient sore throat could be a viral pharyngitis as rapid strep test was negative.  Throat culture and COVID-19 viral swab are pending.  Discussed over-the-counter medications to help alleviate sore throat.  No red flags seen on exam.  No signs of peritonsillar abscess.  No  signs of acute tonsillitis.  Lesion in left axillar appears to be abscess.  This lesion does not appear to be a lymph node.  Also patient has history of MRSA as this points more toward skin infection.  Will treat with clindamycin as this has MRSA coverage as well as coverage for strep throat if throat culture is positive for streptococcal bacteria.  Advised patient to take probiotics due to receiving multiple antibiotics over the past few weeks for multiple different infections.  Patient voiced understanding.  Right ankle injury appears stable.  No red flags seen on exam and patient is neurovascularly intact.  Advised patient to follow-up with scheduled appointment for orthopedist 2 weeks after initial visit.  X-ray was negative for any acute bony abnormality after review of chart.  Patient advised to follow-up with orthopedist as instructed by orthopedist and orthopedist  note.  Patient was agreeable with plan.   Discussed strict return precautions. Patient verbalized understanding and is agreeable with plan.   This visit was coded as a level 4 due to multiple complaints addressed.  Final Clinical Impressions(s) / UC Diagnoses   Final diagnoses:  Sore throat  Encounter for laboratory testing for COVID-19 virus  Abscess of left axilla  Acute right ankle pain     Discharge Instructions      Your rapid strep test was negative.  Your throat culture and COVID-19 viral swab are pending.  We will call if they are positive.  You are being treated with clindamycin antibiotic to help treat abscess of the left armpit.  Please also use warm compresses multiple times daily.  Follow-up with primary care or urgent care if armpit symptoms persist.  Please also take daily bowel probiotic.  Follow-up with orthopedist for right ankle pain.     ED Prescriptions     Medication Sig Dispense Auth. Provider   clindamycin (CLEOCIN) 150 MG capsule Take 2 capsules (300 mg total) by mouth 3 (three) times daily for 10 days. 60 capsule Lance Muss, FNP      PDMP not reviewed this encounter.   Lance Muss, FNP 02/06/21 1053    Lance Muss, FNP 02/06/21 1058

## 2021-02-06 NOTE — ED Triage Notes (Signed)
Lump under left arm, noticed it yesterday.  Painful swallowing x 2 days, daughter just got over bacterial sinus infection.   Had an ankle injury she was seen here for 1 week prior, has been wearing aircast, ankle still feels very stiff and painful to move.

## 2021-02-07 LAB — SARS-COV-2, NAA 2 DAY TAT

## 2021-02-07 LAB — NOVEL CORONAVIRUS, NAA: SARS-CoV-2, NAA: NOT DETECTED

## 2021-02-09 LAB — CULTURE, GROUP A STREP (THRC)

## 2021-02-10 ENCOUNTER — Ambulatory Visit (INDEPENDENT_AMBULATORY_CARE_PROVIDER_SITE_OTHER): Payer: Medicaid Other | Admitting: Family Medicine

## 2021-02-10 ENCOUNTER — Encounter: Payer: Self-pay | Admitting: Family Medicine

## 2021-02-10 ENCOUNTER — Ambulatory Visit: Payer: Self-pay | Admitting: *Deleted

## 2021-02-10 VITALS — BP 119/83 | Ht 69.0 in | Wt 300.0 lb

## 2021-02-10 DIAGNOSIS — M546 Pain in thoracic spine: Secondary | ICD-10-CM | POA: Diagnosis not present

## 2021-02-10 DIAGNOSIS — M7652 Patellar tendinitis, left knee: Secondary | ICD-10-CM | POA: Diagnosis not present

## 2021-02-10 DIAGNOSIS — M7651 Patellar tendinitis, right knee: Secondary | ICD-10-CM | POA: Diagnosis not present

## 2021-02-10 DIAGNOSIS — M25571 Pain in right ankle and joints of right foot: Secondary | ICD-10-CM

## 2021-02-10 NOTE — Telephone Encounter (Signed)
C/o "lump" under left axially area. Size of grape. Started on Sunday and was seen in [facility] 02/06/21. Mass becoming bigger in size and more painful. Denies redness or drainage or itching. No fever today , had fever 2 days ago . Was told to f/u with PCP if lump more painful. Patient also requesting STD testing. No symptoms reported but partner was "unfaithful". Appt scheduled 02/23/21. Please advise . Care advise given. Patient verbalized understanding of care advise and to call back or go to [facility] or ED if symptoms worsen.

## 2021-02-10 NOTE — Telephone Encounter (Signed)
Reason for Disposition  [1] Swelling is painful to touch AND [2] no fever  Answer Assessment - Initial Assessment Questions 1. APPEARANCE of SWELLING: "What does it look like?" (e.g., lymph node, insect bite, mole)     Small swollen mass 2. SIZE: "How large is the swelling?" (e.g., inches, cm; or compare to size of pinhead, tip of pen, eraser, coin, pea, grape, ping pong ball)      Size of grape 3. LOCATION: "Where is the swelling located?"     Under left arm axillary area 4. ONSET: "When did the swelling start?"     Sunday , went to [facility] 02/06/21 received antibiotics  5. PAIN: "Is it painful?" If Yes, ask: "How much?"     Yes  6. ITCH: "Does it itch?" If Yes, ask: "How much?"     no 7. CAUSE: "What do you think caused the swelling?"     Not sure thought was a "boil" 8. OTHER SYMPTOMS: "Do you have any other symptoms?" (e.g., fever)     No fever today had fever 2 days ago  Protocols used: Skin Lump or Localized Swelling-A-AH

## 2021-02-11 NOTE — Progress Notes (Signed)
  Ann Moran - 35 y.o. female MRN [id number]  Date of birth: 22-Feb-1986    SUBJECTIVE:      Chief Complaint:/ HPI:   1. Bilateral knee pain: anterior. Below knee cap Worst with climbing stairs. Chronic. No giving way or locking 2. F/u ankle on right---much better. Has been walking with minmal pain. Swelling better 3. F/u chronic thoracic back pain. Has been less diligent with exercises. Better than when she first was seen, no better and no worse since last ov.   OBJECTIVE: BP 119/83   Ht 5\' 9"  (1.753 m)   Wt 300 lb (136.1 kg)   BMI 44.30 kg/m   Physical Exam:  Vital signs are reviewed. ANKLE right: mild soft tissue swelling ATFL area. FROM intact active strength eversion and inversion. Single leg heel raise somewhat painful. KNEES laterally angled patella with poor VMO development bilaterally. Ligaments on both knees intact to varus and valgus stress, normal lachman. Mild ttp over patellar tendons.BACK FROM. No ttp in thoracic area.  ASSESSMENT & PLAN:  See problem based charting & AVS for pt instructions. No problem-specific Assessment & Plan notes found for this encounter. Patellar tednonitis, chronic. Underlying lateralization of patella. Rehab program given and explained. F/u 4-6 weeks. ATFL sprain improving ankle rehab program given and explained. F/u 4-6 weeks 3. Chroni thoracic back pain, improved but not resolved. Recommendedd continuing HEP.

## 2021-02-23 ENCOUNTER — Encounter: Payer: Self-pay | Admitting: Physician Assistant

## 2021-02-23 ENCOUNTER — Other Ambulatory Visit (HOSPITAL_COMMUNITY)
Admission: RE | Admit: 2021-02-23 | Discharge: 2021-02-23 | Disposition: A | Discharge status: Home / Self Care | Payer: Medicaid Other | Source: Ambulatory Visit | Attending: Physician Assistant | Admitting: Physician Assistant

## 2021-02-23 ENCOUNTER — Ambulatory Visit: Payer: Medicaid Other | Attending: Physician Assistant | Admitting: Physician Assistant

## 2021-02-23 ENCOUNTER — Other Ambulatory Visit: Payer: Self-pay

## 2021-02-23 VITALS — BP 144/102 | HR 84 | Resp 16 | Wt 312.4 lb

## 2021-02-23 DIAGNOSIS — Z800 Family history of malignant neoplasm of digestive organs: Secondary | ICD-10-CM | POA: Diagnosis not present

## 2021-02-23 DIAGNOSIS — I10 Essential (primary) hypertension: Secondary | ICD-10-CM

## 2021-02-23 DIAGNOSIS — Z131 Encounter for screening for diabetes mellitus: Secondary | ICD-10-CM

## 2021-02-23 DIAGNOSIS — L0292 Furuncle, unspecified: Secondary | ICD-10-CM

## 2021-02-23 DIAGNOSIS — N898 Other specified noninflammatory disorders of vagina: Secondary | ICD-10-CM

## 2021-02-23 DIAGNOSIS — Z113 Encounter for screening for infections with a predominantly sexual mode of transmission: Secondary | ICD-10-CM

## 2021-02-23 DIAGNOSIS — F17200 Nicotine dependence, unspecified, uncomplicated: Secondary | ICD-10-CM

## 2021-02-23 MED ORDER — AMLODIPINE BESYLATE 10 MG PO TABS: 10.0000 mg | ORAL_TABLET | Freq: Every day | ORAL | 0 refills | Status: DC

## 2021-02-23 MED ORDER — LOSARTAN POTASSIUM 100 MG PO TABS
100.0000 mg | ORAL_TABLET | Freq: Every day | ORAL | 0 refills | Status: DC
Start: 2021-02-23 — End: 2021-08-29

## 2021-02-23 MED ORDER — MUPIROCIN 2 % EX OINT: 1.0000 "application " | TOPICAL_OINTMENT | Freq: Two times a day (BID) | CUTANEOUS | 1 refills | Status: DC

## 2021-02-23 NOTE — Progress Notes (Signed)
Ann Moran, is a 35 y.o. female  KKX:[id number]  JIR:[id number]  DOB - [birth year]  Chief Complaint  Patient presents with   Exposure to STD       Subjective:   Ann Moran is a 35 y.o. female here today for boil under L arm and she has been getting these in various places occasionally.  Also, inconsistently taking BP meds.  She has not taken them in weeks.  No HA/CP/dizziness.  Smokes about 1ppd.  Also wants referral for colon CA screening due to FH colon CA.  She occasional has BRBPR.  Not often.  Also wants STD screening.  Some vaginal discharge.  No pelvic pain.  Problem  Smoking    ALLERGIES: No Known Allergies  PAST MEDICAL HISTORY: Past Medical History:  Diagnosis Date   Allergy    Anemia    Anxiety    Chlamydia    Depression    hx of meds, none currently- doing ok   Diabetes mellitus without complication (HCC)    GERD (gastroesophageal reflux disease)    Infection due to trichomonas    MRSA (methicillin resistant staph aureus) culture positive    Dec 2012   Obesity    Pregnancy induced hypertension    Urinary tract infection     MEDICATIONS AT HOME: Prior to Admission medications   Medication Sig Start Date End Date Taking? Authorizing Provider  benzonatate (TESSALON) 100 MG capsule Take 1 capsule (100 mg total) by mouth 3 (three) times daily as needed for cough. 01/19/21  Yes Waldon Merl, PA-C  hydrOXYzine (ATARAX/VISTARIL) 25 MG tablet Take 1 tablet (25 mg total) by mouth every 8 (eight) hours as needed for anxiety. 10/18/20  Yes Hoy Register, MD  mupirocin ointment (BACTROBAN) 2 % Apply 1 application topically 2 (two) times daily. Prn boils 02/23/21  Yes Tiare Rohlman M, PA-C  albuterol (VENTOLIN HFA) 108 (90 Base) MCG/ACT inhaler Inhale 2 puffs into the lungs every 4 (four) hours as needed for wheezing or shortness of breath. Patient not taking: Reported on 02/23/2021 08/09/20   Storm Frisk, MD  amLODipine (NORVASC) 10 MG tablet Take 1  tablet (10 mg total) by mouth daily. 02/23/21   Anders Simmonds, PA-C  cetirizine (ZYRTEC) 10 MG tablet TAKE 1 TABLET(10 MG) BY MOUTH DAILY Patient not taking: Reported on 02/23/2021 07/05/20   Hoy Register, MD  FLUoxetine (PROZAC) 20 MG tablet Take 1 tablet (20 mg total) by mouth daily. Patient not taking: Reported on 02/23/2021 09/12/20   Hoy Register, MD  fluticasone (FLONASE) 50 MCG/ACT nasal spray Place 2 sprays into both nostrils daily. Patient not taking: Reported on 02/23/2021 01/19/21   Waldon Merl, PA-C  losartan (COZAAR) 100 MG tablet Take 1 tablet (100 mg total) by mouth daily. 02/23/21   Anders Simmonds, PA-C  citalopram (CELEXA) 20 MG tablet Take 1 tablet (20 mg total) by mouth daily. Patient not taking: Reported on 03/14/2020 10/19/19 03/23/20  Hoy Register, MD  medroxyPROGESTERone (PROVERA) 5 MG tablet Take 2 tablets (10 mg total) by mouth daily. Patient not taking: Reported on 07/20/2018 06/30/18 12/06/18  Geoffery Lyons, MD    ROS: Neg HEENT Neg resp Neg cardiac Neg GI Neg GU Neg MS Neg psych Neg neuro  Objective:   Vitals:   02/23/21 0941  BP: (!) 144/102  Pulse: 84  Resp: 16  SpO2: 97%  Weight: (!) 312 lb 6.4 oz (141.7 kg)   Exam General appearance : Awake, alert, not in any distress.  Speech Clear. Not toxic looking HEENT: Atraumatic and Normocephalic Neck: Supple, no JVD. No cervical lymphadenopathy.  Chest: Good air entry bilaterally, CTAB.  No rales/rhonchi/wheezing CVS: S1 S2 regular, no murmurs.  L axilla with 1 cm superficial inclusion cyst with visible puncta.  Mild erythema.  No LN Extremities: B/L Lower Ext shows no edema, both legs are warm to touch Neurology: Awake alert, and oriented X 3, CN II-XII intact, Non focal Skin: No Rash  Data Review Lab Results  Component Value Date   HGBA1C 5.7 (H) 09/24/2019   HGBA1C 5.7 (H) 02/16/2019    Assessment & Plan   1. Screening examination for STD (sexually transmitted disease) Safe sex  practices always - Cervicovaginal ancillary only - RPR  2. Family history of colon cancer - Ambulatory referral to Gastroenterology  3. Boil Drink 80-100 ounces water daily - mupirocin ointment (BACTROBAN) 2 %; Apply 1 application topically 2 (two) times daily. Prn boils  Dispense: 22 g; Refill: 1  4. Vaginal discharge - Cervicovaginal ancillary only  5. Smoking Smoking and dangers of nicotine have been discussed at length. Long term health consequences of smoking reviewed in detail.  Methods for helping with cessation have been reviewed.  Patient expresses understanding.   6. Essential hypertension We have discussed target BP range and blood pressure goal. I have advised patient to check BP regularly and to call us back or report to clinic if the numbers are consistently higher than 140/90. We discussed the importance of compliance with medical therapy and DASH diet recommended, consequences of uncontrolled hypertension discussed.  Set alarms for BP meds.  Check BP daily and record(she says she will get a cuff) - amLODipine (NORVASC) 10 MG tablet; Take 1 tablet (10 mg total) by mouth daily.  Dispense: 90 tablet; Refill: 0 - Comprehensive metabolic panel - losartan (COZAAR) 100 MG tablet; Take 1 tablet (100 mg total) by mouth daily.  Dispense: 90 tablet; Refill: 0  7. Screening for diabetes mellitus I have had a lengthy discussion and provided education about insulin resistance and the intake of too much sugar/refined carbohydrates.  I have advised the patient to work at a goal of eliminating sugary drinks, candy, desserts, sweets, refined sugars, processed foods, and white carbohydrates.  The patient expresses understanding.   - Comprehensive metabolic panel - Hemoglobin A1c    Patient have been counseled extensively about nutrition and exercise. Other issues discussed during this visit include: low cholesterol diet, weight control and daily exercise, foot care, annual eye  examinations at Ophthalmology, importance of adherence with medications and regular follow-up. We also discussed long term complications of uncontrolled diabetes and hypertension.   Return for [facility] for BP in 3-4 weeks and PCP in 3 months.  The patient was given clear instructions to go to ER or return to medical center if symptoms don't improve, worsen or new problems develop. The patient verbalized understanding. The patient was told to call to get lab results if they haven't heard anything in the next week.      Georgian Co, PA-C [facility] [place], [place] [phone]   02/23/2021, 10:03 AM Patient ID: Ann Moran, female   DOB: May 09, 1985, 35 y.o.   MRN: [id number]

## 2021-02-23 NOTE — Patient Instructions (Signed)
Check blood pressure daily and record.  Set a reminder to take your blood pressure meds daily    Smoking Tobacco Information, Adult Smoking tobacco can be harmful to your health. Tobacco contains a poisonous (toxic), colorless chemical called nicotine. Nicotine is addictive. It changes the brain and can make it hard to stop smoking. Tobacco also has other toxic chemicals that can hurt your body and raise your risk of many cancers. How can smoking tobacco affect me? Smoking tobacco puts you at risk for: Cancer. Smoking is most commonly associated with lung cancer, but can also lead to cancer in other parts of the body. Chronic obstructive pulmonary disease (COPD). This is a long-term lung condition that makes it hard to breathe. It also gets worse over time. High blood pressure (hypertension), heart disease, stroke, or heart attack. Lung infections, such as pneumonia. Cataracts. This is when the lenses in the eyes become clouded. Digestive problems. This may include peptic ulcers, heartburn, and gastroesophageal reflux disease (GERD). Oral health problems, such as gum disease and tooth loss. Loss of taste and smell. Smoking can affect your appearance by causing: Wrinkles. Yellow or stained teeth, fingers, and fingernails. Smoking tobacco can also affect your social life, because: It may be challenging to find places to smoke when away from home. Many workplaces, [employer], hotels, and public places are tobacco-free. Smoking is expensive. This is due to the cost of tobacco and the long-term costs of treating health problems from smoking. Secondhand smoke may affect those around you. Secondhand smoke can cause lung cancer, breathing problems, and heart disease. Children of smokers have a higher risk for: Sudden infant death syndrome (SIDS). Ear infections. Lung infections. If you currently smoke tobacco, quitting now can help you: Lead a longer and healthier life. Look, smell, breathe, and  feel better over time. Save money. Protect others from the harms of secondhand smoke. What actions can I take to prevent health problems? Quit smoking  Do not start smoking. Quit if you already do. Make a plan to quit smoking and commit to it. Look for programs to help you and ask your health care provider for recommendations and ideas. Set a date and write down all the reasons you want to quit. Let your friends and family know you are quitting so they can help and support you. Consider finding friends who also want to quit. It can be easier to quit with someone else, so that you can support each other. Talk with your health care provider about using nicotine replacement medicines to help you quit, such as gum, lozenges, patches, sprays, or pills. Do not replace cigarette smoking with electronic cigarettes, which are commonly called e-cigarettes. The safety of e-cigarettes is not known, and some may contain harmful chemicals. If you try to quit but return to smoking, stay positive. It is common to slip up when you first quit, so take it one day at a time. Be prepared for cravings. When you feel the urge to smoke, chew gum or suck on hard candy. Lifestyle Stay busy and take care of your body. Drink enough fluid to keep your urine pale yellow. Get plenty of exercise and eat a healthy diet. This can help prevent weight gain after quitting. Monitor your eating habits. Quitting smoking can cause you to have a larger appetite than when you smoke. Find ways to relax. Go out with friends or family to a movie or a restaurant where people do not smoke. Ask your health care provider about having regular  tests (screenings) to check for cancer. This may include blood tests, imaging tests, and other tests. Find ways to manage your stress, such as meditation, yoga, or exercise. Where to find support To get support to quit smoking, consider: Asking your health care provider for more information and  resources. Taking classes to learn more about quitting smoking. Looking for local organizations that offer resources about quitting smoking. Joining a support group for people who want to quit smoking in your local community. Calling the smokefree.gov counselor helpline: 1-800-Quit-Now 2892008231) Where to find more information You may find more information about quitting smoking from: HelpGuide.org: www.helpguide.org [employer]: smokefree.gov American Lung Association: www.lung.org Contact a health care provider if you: Have problems breathing. Notice that your lips, nose, or fingers turn blue. Have chest pain. Are coughing up blood. Feel faint or you pass out. Have other health changes that cause you to worry. Summary Smoking tobacco can negatively affect your health, the health of those around you, your finances, and your social life. Do not start smoking. Quit if you already do. If you need help quitting, ask your health care provider. Think about joining a support group for people who want to quit smoking in your local community. There are many effective programs that will help you to quit this behavior. This information is not intended to replace advice given to you by your health care provider. Make sure you discuss any questions you have with your health care provider. Document Revised: 06/29/2020 Document Reviewed: 03/01/2020 Elsevier Patient Education  2022 Else[employer] Inc.  Hypertension, Adult Hypertension is another name for high blood pressure. High blood pressure forces your heart to work harder to pump blood. This can cause problems over time. There are two numbers in a blood pressure reading. There is a top number (systolic) over a bottom number (diastolic). It is best to have a blood pressure that is below 120/80. Healthy choices can help lower your blood pressure, or you may need medicine to help lower it. What are the causes? The cause of this condition is not  known. Some conditions may be related to high blood pressure. What increases the risk? Smoking. Having type 2 diabetes mellitus, high cholesterol, or both. Not getting enough exercise or physical activity. Being overweight. Having too much fat, sugar, calories, or salt (sodium) in your diet. Drinking too much alcohol. Having long-term (chronic) kidney disease. Having a family history of high blood pressure. Age. Risk increases with age. Race. You may be at higher risk if you are African American. Gender. Men are at higher risk than women before age 40. After age 60, women are at higher risk than men. Having obstructive sleep apnea. Stress. What are the signs or symptoms? High blood pressure may not cause symptoms. Very high blood pressure (hypertensive crisis) may cause: Headache. Feelings of worry or nervousness (anxiety). Shortness of breath. Nosebleed. A feeling of being sick to your stomach (nausea). Throwing up (vomiting). Changes in how you see. Very bad chest pain. Seizures. How is this treated? This condition is treated by making healthy lifestyle changes, such as: Eating healthy foods. Exercising more. Drinking less alcohol. Your health care provider may prescribe medicine if lifestyle changes are not enough to get your blood pressure under control, and if: Your top number is above 130. Your bottom number is above 80. Your personal target blood pressure may vary. Follow these instructions at home: Eating and drinking  If told, follow the DASH eating plan. To follow this plan: Fill one half  of your plate at each meal with fruits and vegetables. Fill one fourth of your plate at each meal with whole grains. Whole grains include whole-wheat pasta, brown rice, and whole-grain bread. Eat or drink low-fat dairy products, such as skim milk or low-fat yogurt. Fill one fourth of your plate at each meal with low-fat (lean) proteins. Low-fat proteins include fish, chicken  without skin, eggs, beans, and tofu. Avoid fatty meat, cured and processed meat, or chicken with skin. Avoid pre-made or processed food. Eat less than 1,500 mg of salt each day. Do not drink alcohol if: Your doctor tells you not to drink. You are pregnant, may be pregnant, or are planning to become pregnant. If you drink alcohol: Limit how much you use to: 0-1 drink a day for women. 0-2 drinks a day for men. Be aware of how much alcohol is in your drink. In the [place] one drink equals one 12 oz bottle of beer (355 mL), one 5 oz glass of wine (148 mL), or one 1 oz glass of hard liquor (44 mL). Lifestyle  Work with your doctor to stay at a healthy weight or to lose weight. Ask your doctor what the best weight is for you. Get at least 30 minutes of exercise most days of the week. This may include walking, swimming, or biking. Get at least 30 minutes of exercise that strengthens your muscles (resistance exercise) at least 3 days a week. This may include lifting weights or doing Pilates. Do not use any products that contain nicotine or tobacco, such as cigarettes, e-cigarettes, and chewing tobacco. If you need help quitting, ask your doctor. Check your blood pressure at home as told by your doctor. Keep all follow-up visits as told by your doctor. This is important. Medicines Take over-the-counter and prescription medicines only as told by your doctor. Follow directions carefully. Do not skip doses of blood pressure medicine. The medicine does not work as well if you skip doses. Skipping doses also puts you at risk for problems. Ask your doctor about side effects or reactions to medicines that you should watch for. Contact a doctor if you: Think you are having a reaction to the medicine you are taking. Have headaches that keep coming back (recurring). Feel dizzy. Have swelling in your ankles. Have trouble with your vision. Get help right away if you: Get a very bad headache. Start to feel  mixed up (confused). Feel weak or numb. Feel faint. Have very bad pain in your: Chest. Belly (abdomen). Throw up more than once. Have trouble breathing. Summary Hypertension is another name for high blood pressure. High blood pressure forces your heart to work harder to pump blood. For most people, a normal blood pressure is less than 120/80. Making healthy choices can help lower blood pressure. If your blood pressure does not get lower with healthy choices, you may need to take medicine. This information is not intended to replace advice given to you by your health care provider. Make sure you discuss any questions you have with your health care provider. Document Revised: 12/18/2017 Document Reviewed: 12/18/2017 Elsevier Patient Education  2022 [employer].

## 2021-02-24 ENCOUNTER — Encounter: Payer: Self-pay | Admitting: Family Medicine

## 2021-02-24 LAB — CERVICOVAGINAL ANCILLARY ONLY
Bacterial Vaginitis (gardnerella): NEGATIVE
Candida Glabrata: NEGATIVE
Candida Vaginitis: POSITIVE — AB
Chlamydia: NEGATIVE
Comment: NEGATIVE
Comment: NEGATIVE
Comment: NEGATIVE
Comment: NEGATIVE
Comment: NEGATIVE
Comment: NORMAL
Neisseria Gonorrhea: NEGATIVE
Trichomonas: NEGATIVE

## 2021-02-24 LAB — COMPREHENSIVE METABOLIC PANEL
ALT: 14 IU/L (ref 0–32)
AST: 14 IU/L (ref 0–40)
Albumin/Globulin Ratio: 1.8 (ref 1.2–2.2)
Albumin: 4.4 g/dL (ref 3.8–4.8)
Alkaline Phosphatase: 68 IU/L (ref 44–121)
BUN/Creatinine Ratio: 8 — ABNORMAL LOW (ref 9–23)
BUN: 6 mg/dL (ref 6–20)
Bilirubin Total: 0.3 mg/dL (ref 0.0–1.2)
CO2: 26 mmol/L (ref 20–29)
Calcium: 8.9 mg/dL (ref 8.7–10.2)
Chloride: 102 mmol/L (ref 96–106)
Creatinine, Ser: 0.77 mg/dL (ref 0.57–1.00)
Globulin, Total: 2.4 g/dL (ref 1.5–4.5)
Glucose: 96 mg/dL (ref 70–99)
Potassium: 4.2 mmol/L (ref 3.5–5.2)
Sodium: 142 mmol/L (ref 134–144)
Total Protein: 6.8 g/dL (ref 6.0–8.5)
eGFR: 103 mL/min/{1.73_m2} (ref >59)

## 2021-02-24 LAB — RPR: RPR Ser Ql: NONREACTIVE

## 2021-02-24 LAB — HEMOGLOBIN A1C
Est. average glucose Bld gHb Est-mCnc: 117 mg/dL
Hgb A1c MFr Bld: 5.7 % — ABNORMAL HIGH (ref 4.8–5.6)

## 2021-02-25 ENCOUNTER — Other Ambulatory Visit: Payer: Self-pay | Admitting: Family Medicine

## 2021-02-25 DIAGNOSIS — K0889 Other specified disorders of teeth and supporting structures: Secondary | ICD-10-CM

## 2021-02-28 ENCOUNTER — Other Ambulatory Visit: Payer: Self-pay | Admitting: Physician Assistant

## 2021-02-28 MED ORDER — FLUCONAZOLE 150 MG PO TABS: 150.0000 mg | ORAL_TABLET | Freq: Once | ORAL | 0 refills | Status: AC

## 2021-03-10 ENCOUNTER — Ambulatory Visit: Payer: Medicaid Other | Admitting: Family Medicine

## 2021-03-14 ENCOUNTER — Telehealth: Payer: Self-pay

## 2021-03-14 NOTE — Telephone Encounter (Signed)
Letter has been sent to patient informing them that their sleep study has expired. Patient will need to call and schedule an office visit to re-evaluate the need for a sleep study.    

## 2021-03-23 ENCOUNTER — Ambulatory Visit: Payer: Medicaid Other

## 2021-03-27 ENCOUNTER — Ambulatory Visit (INDEPENDENT_AMBULATORY_CARE_PROVIDER_SITE_OTHER): Payer: Medicaid Other | Admitting: Family Medicine

## 2021-03-27 ENCOUNTER — Encounter: Payer: Self-pay | Admitting: Family Medicine

## 2021-03-27 VITALS — BP 130/84 | Ht 69.0 in | Wt 300.0 lb

## 2021-03-27 DIAGNOSIS — M25521 Pain in right elbow: Secondary | ICD-10-CM | POA: Diagnosis not present

## 2021-03-27 MED ORDER — MELOXICAM 15 MG PO TABS: 15.0000 mg | ORAL_TABLET | Freq: Every day | ORAL | 1 refills | Status: DC

## 2021-03-27 NOTE — Patient Instructions (Signed)
You have a forearm strain. Take meloxicam 15mg  daily with food for pain and inflammation. Icing 15 minutes at a time 3-4 times a day to the forearm. Consider wrist brace to rest this. Simple stretches of your wrist - hold for 20 seconds as we discussed. This should be the worst this feels and it should improve over the next 1-1.5 weeks. Wait until that time to put that second coat of paint on! Follow up with in about 4 weeks if not improving as expected.
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
## 2021-03-29 ENCOUNTER — Encounter: Payer: Self-pay | Admitting: Family Medicine

## 2021-03-29 ENCOUNTER — Other Ambulatory Visit: Payer: Self-pay | Admitting: Critical Care Medicine

## 2021-03-30 ENCOUNTER — Other Ambulatory Visit: Payer: Self-pay | Admitting: Family Medicine

## 2021-03-30 DIAGNOSIS — J019 Acute sinusitis, unspecified: Secondary | ICD-10-CM

## 2021-03-30 DIAGNOSIS — B9689 Other specified bacterial agents as the cause of diseases classified elsewhere: Secondary | ICD-10-CM

## 2021-03-30 MED ORDER — BENZONATATE 100 MG PO CAPS: 100.0000 mg | ORAL_CAPSULE | Freq: Three times a day (TID) | ORAL | 0 refills | Status: DC | PRN

## 2021-03-30 MED ORDER — ALBUTEROL SULFATE HFA 108 (90 BASE) MCG/ACT IN AERS: 2.0000 | INHALATION_SPRAY | RESPIRATORY_TRACT | 0 refills | Status: DC | PRN

## 2021-04-05 ENCOUNTER — Other Ambulatory Visit: Payer: Self-pay | Admitting: Nurse Practitioner

## 2021-04-05 ENCOUNTER — Ambulatory Visit (HOSPITAL_COMMUNITY)
Admission: RE | Admit: 2021-04-05 | Discharge: 2021-04-05 | Disposition: A | Discharge status: Home / Self Care | Payer: Medicaid Other | Source: Ambulatory Visit | Attending: Nurse Practitioner | Admitting: Nurse Practitioner

## 2021-04-05 ENCOUNTER — Encounter: Payer: Self-pay | Admitting: Nurse Practitioner

## 2021-04-05 ENCOUNTER — Other Ambulatory Visit: Payer: Self-pay

## 2021-04-05 ENCOUNTER — Ambulatory Visit (HOSPITAL_BASED_OUTPATIENT_CLINIC_OR_DEPARTMENT_OTHER): Payer: Medicaid Other | Admitting: Nurse Practitioner

## 2021-04-05 DIAGNOSIS — R0989 Other specified symptoms and signs involving the circulatory and respiratory systems: Secondary | ICD-10-CM

## 2021-04-05 DIAGNOSIS — Z2089 Contact with and (suspected) exposure to other communicable diseases: Secondary | ICD-10-CM

## 2021-04-05 MED ORDER — AZITHROMYCIN 250 MG PO TABS: ORAL_TABLET | ORAL | 0 refills | Status: AC

## 2021-04-05 MED ORDER — PROMETHAZINE-DM 6.25-15 MG/5ML PO SYRP: 5.0000 mL | ORAL_SOLUTION | Freq: Four times a day (QID) | ORAL | 0 refills | Status: DC | PRN

## 2021-04-05 NOTE — Progress Notes (Signed)
Virtual Visit via Telephone Note Due to national recommendations of social distancing due to COVID 19, telehealth visit is felt to be most appropriate for this patient at this time.  I discussed the limitations, risks, security and privacy concerns of performing an evaluation and management service by telephone and the availability of in person appointments. I also discussed with the patient that there may be a patient responsible charge related to this service. The patient expressed understanding and agreed to proceed.    I connected with [name] on 04/05/21  at   1:30 PM EST  EDT by telephone and verified that I am speaking with the correct person using two identifiers.  Location of Patient: Private Residence   Location of Provider: [facility]    Persons participating in Telemedicine visit: Bertram Denver FNP-BC ZAHARAH AMIR    History of Present Illness: Telemedicine visit for:   Pneumonia: Patient complains of symptoms related to possible PNA.  States she was told by her daughters pediatrician that her daughter had a type of pneumonia that was contagious and that she likely has it as well and needs to follow-up with her PCP to be checked for bacterial infection in her lungs.  Patient describes symptoms of shortness of breath , bilateral thoracic back pain, pleuritic chest pain, cough, fever to 102, headache, and yellow sputum production  Symptoms began a few weeks ago and have gradually worsening since that time. Patient denies nausea and vomiting or but does endorse loss of taste.  States COVID test 2 days ago was negative and that she does not have COVID nor does her daughter who also tested negative.  Treatment thus far includes Tessalon and over-the-counter Mucinex She does have a history of chronic cough and thoracic back pain as well as tobacco dependence.    Past Medical History:  Diagnosis Date   Allergy    Anemia    Anxiety     Chlamydia    Depression    hx of meds, none currently- doing ok   Diabetes mellitus without complication (HCC)    GERD (gastroesophageal reflux disease)    Infection due to trichomonas    MRSA (methicillin resistant staph aureus) culture positive    Dec 2012   Obesity    Pregnancy induced hypertension    Urinary tract infection     Past Surgical History:  Procedure Laterality Date   CHOLECYSTECTOMY     ECTOPIC PREGNANCY SURGERY      Family History  Problem Relation Age of Onset   Hypertension Mother    Diabetes Mother    Asthma Father    Hypertension Father    Stroke Maternal Grandmother    Diabetes Paternal Grandmother    Hypertension Paternal Grandmother    Colon cancer Paternal Grandmother    Colon cancer Paternal Aunt    Esophageal cancer Neg Hx    Stomach cancer Neg Hx    Rectal cancer Neg Hx     Social History   Socioeconomic History   Marital status: Single    Spouse name: Not on file   Number of children: Not on file   Years of education: Not on file   Highest education level: Not on file  Occupational History   Not on file  Tobacco Use   Smoking status: Every Day    Packs/day: 0.50    Years: 9.00    Pack years: 4.50    Types: Cigarettes   Smokeless tobacco: Never  Vaping Use   Vaping Use: Some days   Last attempt to quit: 08/18/2015   Devices: "rarely"  Substance and Sexual Activity   Alcohol use: Yes   Drug use: No   Sexual activity: Not Currently  Other Topics Concern   Not on file  Social History Narrative   Not on file   Social Determinants of Health   Financial Resource Strain: Not on file  Food Insecurity: Not on file  Transportation Needs: Not on file  Physical Activity: Not on file  Stress: Not on file  Social Connections: Not on file     Observations/Objective: Awake, alert and oriented x 3   Review of Systems  Constitutional:  Positive for fever and malaise/fatigue. Negative for chills.  HENT:  Positive for congestion.  Negative for ear discharge, ear pain, hearing loss, sinus pain and sore throat.   Eyes: Negative.   Respiratory:  Positive for cough, sputum production and shortness of breath. Negative for wheezing.   Cardiovascular: Negative.  Negative for chest pain, orthopnea and leg swelling.  Gastrointestinal: Negative.  Negative for abdominal pain, diarrhea, nausea and vomiting.  Musculoskeletal:  Positive for back pain.  Neurological:  Positive for headaches. Negative for dizziness and focal weakness.  Endo/Heme/Allergies:  Negative for environmental allergies.  Psychiatric/Behavioral: Negative.     Assessment and Plan: Diagnoses and all orders for this visit:  Exposure to pneumonia azithromycin (ZITHROMAX) 250 MG tablet promethazine-dextromethorphan (PROMETHAZINE-DM) 6.25-15 MG/5ML syrup CXRAY  Follow Up Instructions Return if symptoms worsen or fail to improve.     I discussed the assessment and treatment plan with the patient. The patient was provided an opportunity to ask questions and all were answered. The patient agreed with the plan and demonstrated an understanding of the instructions.   The patient was advised to call back or seek an in-person evaluation if the symptoms worsen or if the condition fails to improve as anticipated.  I provided 10 minutes of non-face-to-face time during this encounter including median intraservice time, reviewing previous notes, labs, imaging, medications and explaining diagnosis and management.  Claiborne Rigg, FNP-BC

## 2021-04-06 LAB — CBC WITH DIFFERENTIAL/PLATELET
Basophils Absolute: 0 10*3/uL (ref 0.0–0.2)
Basos: 1 %
EOS (ABSOLUTE): 0.2 10*3/uL (ref 0.0–0.4)
Eos: 2 %
Hematocrit: 39 % (ref 34.0–46.6)
Hemoglobin: 13.6 g/dL (ref 11.1–15.9)
Immature Grans (Abs): 0 10*3/uL (ref 0.0–0.1)
Immature Granulocytes: 1 %
Lymphocytes Absolute: 3.7 10*3/uL — ABNORMAL HIGH (ref 0.7–3.1)
Lymphs: 46 %
MCH: 30.1 pg (ref 26.6–33.0)
MCHC: 34.9 g/dL (ref 31.5–35.7)
MCV: 86 fL (ref 79–97)
Monocytes Absolute: 0.6 10*3/uL (ref 0.1–0.9)
Monocytes: 8 %
Neutrophils Absolute: 3.3 10*3/uL (ref 1.4–7.0)
Neutrophils: 42 %
Platelets: 354 10*3/uL (ref 150–450)
RBC: 4.52 x10E6/uL (ref 3.77–5.28)
RDW: 12.9 % (ref 11.7–15.4)
WBC: 7.7 10*3/uL (ref 3.4–10.8)

## 2021-04-26 ENCOUNTER — Ambulatory Visit (INDEPENDENT_AMBULATORY_CARE_PROVIDER_SITE_OTHER): Payer: Medicaid Other | Admitting: Family Medicine

## 2021-04-26 ENCOUNTER — Encounter: Payer: Self-pay | Admitting: Family Medicine

## 2021-04-26 VITALS — BP 137/95 | Ht 69.0 in | Wt 300.0 lb

## 2021-04-26 DIAGNOSIS — S56911D Strain of unspecified muscles, fascia and tendons at forearm level, right arm, subsequent encounter: Secondary | ICD-10-CM

## 2021-04-26 NOTE — Patient Instructions (Signed)
Your exam is much better. We will refer you to occupational therapy to work on muscle strengthening/build endurance in these forearm muscles. Take the meloxicam as needed at this point. Continue using the wrist brace during the day. Follow up with me in 5-6 weeks.
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
## 2021-04-28 ENCOUNTER — Other Ambulatory Visit: Payer: Self-pay

## 2021-04-28 ENCOUNTER — Ambulatory Visit: Payer: Medicaid Other | Attending: Family Medicine | Admitting: Occupational Therapy

## 2021-04-28 DIAGNOSIS — M6281 Muscle weakness (generalized): Secondary | ICD-10-CM | POA: Insufficient documentation

## 2021-04-28 DIAGNOSIS — M25531 Pain in right wrist: Secondary | ICD-10-CM | POA: Insufficient documentation

## 2021-04-28 DIAGNOSIS — M25631 Stiffness of right wrist, not elsewhere classified: Secondary | ICD-10-CM | POA: Insufficient documentation

## 2021-04-28 NOTE — Therapy (Signed)
[facility] [address] [place], [place], [address] Phone: [phone]   Fax:  [phone]  Occupational Therapy Treatment  Patient Details  Name: Ann Moran MRN: [id number] Date of Birth: 07-Jul-1985 Referring Provider (OT): Dr. Pearletha Forge   Encounter Date: 04/28/2021   OT End of Session - 04/28/21 1404     Visit Number 1    Number of Visits 12    Date for OT Re-Evaluation 07/07/21    Authorization Type Medicaid    OT Start Time 0851    OT Stop Time 0925    OT Time Calculation (min) 34 min             Past Medical History:  Diagnosis Date   Allergy    Anemia    Anxiety    Chlamydia    Depression    hx of meds, none currently- doing ok   Diabetes mellitus without complication (HCC)    GERD (gastroesophageal reflux disease)    Infection due to trichomonas    MRSA (methicillin resistant staph aureus) culture positive    Dec 2012   Obesity    Pregnancy induced hypertension    Urinary tract infection     Past Surgical History:  Procedure Laterality Date   CHOLECYSTECTOMY     ECTOPIC PREGNANCY SURGERY      There were no vitals filed for this visit.   Subjective Assessment - 04/28/21 0854     Subjective  Pt reports wrist pain started with painting    Patient Stated Goals not to have wrist pain with use    Currently in Pain? Yes    Pain Score 6     Pain Location Wrist    Pain Orientation Right    Pain Descriptors / Indicators Aching    Pain Type Acute pain    Pain Onset 1 to 4 weeks ago    Pain Frequency Intermittent    Aggravating Factors  use    Pain Relieving Factors rest                [facility] OT Assessment - 04/28/21 0858       Assessment   Medical Diagnosis R wrist sprain   +Finklestein's   Referring Provider (OT) Dr. Pearletha Forge    Onset Date/Surgical Date 04/26/21    Hand Dominance Right      Precautions   Precautions None      Balance Screen   Has the patient fallen in  the past 6 months No    Has the patient had a decrease in activity level because of a fear of falling?  No      Home  Environment   Family/patient expects to be discharged to: Private residence    Lives With Significant other;Daughter      Prior Function   Level of Independence Independent      ADL   Eating/Feeding Needs assist with cutting food    Grooming Modified independent      Upper Body Bathing Modified independent    Lower Body Bathing Modified independent    Upper Body Dressing Independent    Lower Body Dressing Modified independent    Toilet Transfer Modified independent    Tub/Shower Transfer Independent    ADL comments Difficulty brushing dtr's hair, using a spray bottle, driving for long period of time,      IADL   Light Housekeeping Maintains house alone or with occasional assistance    Meal Prep Able to  complete simple warm meal prep      Cognition   Overall Cognitive Status Within Functional Limits for tasks assessed      Sensation   Light Touch Appears Intact      Coordination   Gross Motor Movements are Fluid and Coordinated Yes    Fine Motor Movements are Fluid and Coordinated Yes      ROM / Strength   AROM / PROM / Strength AROM      AROM   Overall AROM Comments RUE wrist flexion/ extension 60/60, ulnar/ radial deviation 35/ 30, thumb flexion WFLS however pt demonstrates +Finklestein's      Hand Function   Right Hand Grip (lbs) 43.6    Right Hand Lateral Pinch 9 lbs    Right Hand 3 Point Pinch 7 lbs    Left Hand Grip (lbs) 35.7    Left Hand Lateral Pinch 13 lbs    Left 3 point pinch 8 lbs                              OT Education - 04/28/21 1430     Education Details P/ROM wrist flexion extension, use of ice and  wrist/ thumb brace.    Person(s) Educated Patient    Methods Explanation;Verbal cues;Handout    Comprehension Verbalized understanding              OT Short Term Goals - 04/28/21 1411       OT SHORT  TERM GOAL #1   Title I with inital HEP.    Baseline dependent    Time 4    Period Weeks    Status New    Target Date 05/26/21      OT SHORT TERM GOAL #2   Title I with splint wear, care and precautions    Baseline Pt has splint but she is not wearing regularly, needs education    Time 4    Period Weeks    Status New      OT SHORT TERM GOAL #3   Title I with positioning/  activity modification to minimize pain    Baseline dependent    Time 4    Period Weeks    Status New               OT Long Term Goals - 04/28/21 1412       OT LONG TERM GOAL #1   Title I with updated HEP    Baseline dependent    Time 9    Period Weeks    Status New    Target Date 07/07/21      OT LONG TERM GOAL #2   Title Pt will report pain no greater than 3/10 with RUE functional use.    Baseline Pain 6/10 with repetative use    Time 9    Period Weeks    Status New      OT LONG TERM GOAL #3   Title Pt will report that she has resumed use of RUE as dominant hand for all activities.    Baseline Pt has to use LUE to assist when pain increases    Time 9    Period Weeks    Status New      OT LONG TERM GOAL #4   Title Pt will report increased ease with styling her dtr's hair and using a spray bottle.    Baseline Pt reports difficulty with these  tasks, and increased pain    Time 9    Period Weeks    Status New                   Plan - 04/28/21 1405     Clinical Impression Statement Pt is a 36 y.o female with a diagnosis of forearm strain. Pt presents with positve Finkelstein's test and pt reports pain with repetitive use. Pt. presents with the following deficits: pain, decreased strength, decreased functional use which impedes performance of ADLs/IADLs. Pt can benefit from skilled occupational therapy to address these deficits.    OT Occupational Profile and History Problem Focused Assessment - Including review of records relating to presenting problem    Occupational performance  deficits (Please refer to evaluation for details): ADL's;IADL's;Work;Social Participation;Leisure;Play    Body Structure / Function / Physical Skills ADL;UE functional use;Balance;Flexibility;Pain;FMC;ROM;Coordination;GMC;IADL;Decreased knowledge of use of DME;Strength;Dexterity    Rehab Potential Good    Clinical Decision Making Limited treatment options, no task modification necessary    Comorbidities Affecting Occupational Performance: May have comorbidities impacting occupational performance    Modification or Assistance to Complete Evaluation  No modification of tasks or assist necessary to complete eval    OT Frequency --   2x week x 3 weeks followed by 1x week x 6 weeks or 12 visits   OT Treatment/Interventions Self-care/ADL training;Ultrasound;Patient/family education;Balance training;Passive range of motion;Paraffin;Fluidtherapy;Splinting;Electrical Stimulation;Moist Heat;Therapeutic exercise;Manual Therapy;Therapeutic activities;Neuromuscular education;DME and/or AE instruction     Plan: initiate ionto if order is back, HEP, splint wear   P         Patient will benefit from skilled therapeutic intervention in order to improve the following deficits and impairments:   Body Structure / Function / Physical Skills: ADL, UE functional use, Balance, Flexibility, Pain, FMC, ROM, Coordination, GMC, IADL, Decreased knowledge of use of DME, Strength, Dexterity       Visit Diagnosis: Pain in right wrist - Plan: Ot plan of care cert/re-cert  Stiffness of right wrist, not elsewhere classified - Plan: Ot plan of care cert/re-cert  Muscle weakness (generalized) - Plan: Ot plan of care cert/re-cert    Problem List Patient Active Problem List   Diagnosis Date Noted   Smoking 02/23/2021   Patellar tendinitis of both knees 01/27/2021   Pain in joint involving right ankle and foot 01/27/2021   Thoracic back pain 12/23/2020   Right elbow pain 12/23/2020   Chronic pain of right knee  12/23/2020   Ankle fracture, left 08/09/2020   Dysphagia 05/20/2019   Encounter for other preprocedural examination 05/20/2019   PCOS (polycystic ovarian syndrome) 02/24/2019   Neck pain 02/24/2019   Morbid obesity with body mass index (BMI) of 45.0 to 49.9 in adult [facility](HCC) 02/24/2019   Essential hypertension 02/24/2019   Gastroesophageal reflux disease 02/24/2019   Irregular menstrual cycle 01/14/2019   Abnormal uterine bleeding 01/14/2019   Chronic cough 01/14/2019    Chaselyn Nanney, OT 04/28/2021, 2:30 PM  [facility] [address] [place], [place], [address] Phone: [phone]   Fax:  [phone]  Name: Fransico HimMelanie E Colina MRN: [id number] Date of Birth: 24-Nov-1985

## 2021-04-28 NOTE — Patient Instructions (Signed)
Wear wrist brace during the daytime with activity Avoid repetitive griping pinch and wrist movement as able You can use ice wrapped in a towel for 8-10 mins for pain relief.     PROM: Wrist Flexion / Extension   Grasp  hand and slowly bend wrist until stretch is felt. Relax. Then stretch as far as possible in opposite direction. Be sure to keep elbow bent.  Hold __10__ sec. each way Repeat _5___ times per set.    Do _3-4___ sessions per day.Stop if increased pain

## 2021-05-02 ENCOUNTER — Ambulatory Visit: Payer: Medicaid Other | Admitting: Occupational Therapy

## 2021-05-05 ENCOUNTER — Encounter: Payer: Medicaid Other | Admitting: Occupational Therapy

## 2021-05-09 ENCOUNTER — Ambulatory Visit: Payer: Medicaid Other | Admitting: Occupational Therapy

## 2021-05-10 ENCOUNTER — Other Ambulatory Visit: Payer: Self-pay

## 2021-05-10 ENCOUNTER — Encounter: Payer: Self-pay | Admitting: Occupational Therapy

## 2021-05-10 ENCOUNTER — Ambulatory Visit: Payer: Medicaid Other | Admitting: Occupational Therapy

## 2021-05-10 DIAGNOSIS — M25531 Pain in right wrist: Secondary | ICD-10-CM | POA: Diagnosis not present

## 2021-05-10 DIAGNOSIS — M6281 Muscle weakness (generalized): Secondary | ICD-10-CM

## 2021-05-10 DIAGNOSIS — M25631 Stiffness of right wrist, not elsewhere classified: Secondary | ICD-10-CM

## 2021-05-10 NOTE — Patient Instructions (Signed)
Splint wear- Wear your thumb spica splint most of the daytime for activities that increase pain and sleep in brace at night. Remove splint if pressure areas or redness Avoid repetitive gripping and pinching Use ice for 8-10 mins 1-2 x day wrapped in a towel for pain relief. Do not use on the days you have iontophoresis treatment         PROM: Wrist Flexion / Extension   Grasp  hand and slowly bend wrist until stretch is felt. Relax. Then stretch as far as possible in opposite direction. Be sure to keep elbow bent.  Hold __10__ sec. each way Repeat _10___ times per set.    Do _10_ sessions per day.     MP Flexion (Active)   Bend thumb to touch base of little finger, keeping tip joint straight.Do not move wrist at the same time Repeat __10-15__ times. Do _3__ sessions per day.

## 2021-05-10 NOTE — Therapy (Signed)
[facility] [address] [place], [place], [address] Phone: [phone]   Fax:  [phone]  Occupational Therapy Treatment  Patient Details  Name: Ann Moran MRN: [id number] Date of Birth: May 31, 1985 Referring Provider (OT): Dr. Pearletha Forge   Encounter Date: 05/10/2021   OT End of Session - 05/10/21 0939     Visit Number 2    Number of Visits 12    Date for OT Re-Evaluation 07/07/21    Authorization Type Medicaid    Authorization - Visit Number 1    Authorization - Number of Visits 12    OT Start Time 0900    OT Stop Time 0942    OT Time Calculation (min) 42 min             Past Medical History:  Diagnosis Date   Allergy    Anemia    Anxiety    Chlamydia    Depression    hx of meds, none currently- doing ok   Diabetes mellitus without complication (HCC)    GERD (gastroesophageal reflux disease)    Infection due to trichomonas    MRSA (methicillin resistant staph aureus) culture positive    Dec 2012   Obesity    Pregnancy induced hypertension    Urinary tract infection     Past Surgical History:  Procedure Laterality Date   CHOLECYSTECTOMY     ECTOPIC PREGNANCY SURGERY      There were no vitals filed for this visit.   Subjective Assessment - 05/10/21 0946     Subjective  Pt reports continued wrist pain with activity    Patient Stated Goals not to have wrist pain with use    Currently in Pain? Yes    Pain Score 8     Pain Location Wrist   radial   Pain Orientation Right    Pain Descriptors / Indicators Aching    Pain Type Chronic pain    Pain Onset 1 to 4 weeks ago    Pain Frequency Intermittent    Aggravating Factors  overuse    Pain Relieving Factors rest               Treatment: Pt was fitted with a pre-fab wrist brace for improved positioning, and minimizing pain, Pt verbalized understanding of wear and care.          OT Treatments/Exercises (OP) - 05/10/21 0001        Modalities   Modalities Iontophoresis      Iontophoresis   Type of Iontophoresis Dexamethasone    Location right radial wrist    Dose 1.5 cc, small pad, intensity 1.8-2.0    Time 21 mins                    Treatment/ education   Education Details P/ROM wrist flexion extension, A/ROM thumb flexion separate from thumb movement, splint wear and care, precautions    Person(s) Educated Patient    Methods Explanation;Verbal cues;Handout    Comprehension Verbalized understanding;Returned demonstration;Verbal cues required              OT Short Term Goals - 04/28/21 1411       OT SHORT TERM GOAL #1   Title I with inital HEP.    Baseline dependent    Time 4    Period Weeks    Status New    Target Date 05/26/21      OT SHORT TERM GOAL #2  Title I with splint wear, care and precautions    Baseline Pt has splint but she is not wearing regularly, needs education    Time 4    Period Weeks    Status New      OT SHORT TERM GOAL #3   Title I with positioning/  activity modification to minimize pain    Baseline dependent    Time 4    Period Weeks    Status New               OT Long Term Goals - 04/28/21 1412       OT LONG TERM GOAL #1   Title I with updated HEP    Baseline dependent    Time 9    Period Weeks    Status New    Target Date 07/07/21      OT LONG TERM GOAL #2   Title Pt will report pain no greater than 3/10 with RUE functional use.    Baseline Pain 6/10 with repetative use    Time 9    Period Weeks    Status New      OT LONG TERM GOAL #3   Title Pt will report that she has resumed use of RUE as dominant hand for all activities.    Baseline Pt has to use LUE to assist when pain increases    Time 9    Period Weeks    Status New      OT LONG TERM GOAL #4   Title Pt will report increased ease with styling her dtr's hair and using a spray bottle.    Baseline Pt reports difficulty with these tasks, and increased pain    Time 9     Period Weeks    Status New                   Plan - 05/10/21 0940     Clinical Impression Statement Pt is progressing towards goals. Pt was issued thumb spica splint and iontophoresis was initated today.    OT Occupational Profile and History Problem Focused Assessment - Including review of records relating to presenting problem    Occupational performance deficits (Please refer to evaluation for details): ADL's;IADL's;Work;Social Participation;Leisure;Play    Body Structure / Function / Physical Skills ADL;UE functional use;Balance;Flexibility;Pain;FMC;ROM;Coordination;GMC;IADL;Decreased knowledge of use of DME;Strength;Dexterity    Rehab Potential Good    Clinical Decision Making Limited treatment options, no task modification necessary    Comorbidities Affecting Occupational Performance: May have comorbidities impacting occupational performance    Modification or Assistance to Complete Evaluation  No modification of tasks or assist necessary to complete eval    OT Frequency --   2x week x 3 weeks followed by 1x week x 6 weeks or 12 visits   OT Treatment/Interventions Self-care/ADL training;Ultrasound;Patient/family education;Balance training;Passive range of motion;Paraffin;Fluidtherapy;Splinting;Electrical Stimulation;Moist Heat;Therapeutic exercise;Manual Therapy;Therapeutic activities;Neuromuscular education;DME and/or AE instruction;Iontophoresis;Cryotherapy;Contrast Bath    Plan review P/ROM wrist flexion/ extension, and A/ROM thumb flexion/ extension separate for wrist movement, iontophoresis # 2, continue education in positioning and activity modification to minimize symptoms    Consulted and Agree with Plan of Care Patient             Patient will benefit from skilled therapeutic intervention in order to improve the following deficits and impairments:   Body Structure / Function / Physical Skills: ADL, UE functional use, Balance, Flexibility, Pain, FMC, ROM,  Coordination, GMC, IADL, Decreased knowledge of use of DME,  Strength, Dexterity       Visit Diagnosis: Pain in right wrist  Stiffness of right wrist, not elsewhere classified  Muscle weakness (generalized)    Problem List Patient Active Problem List   Diagnosis Date Noted   Smoking 02/23/2021   Patellar tendinitis of both knees 01/27/2021   Pain in joint involving right ankle and foot 01/27/2021   Thoracic back pain 12/23/2020   Right elbow pain 12/23/2020   Chronic pain of right knee 12/23/2020   Ankle fracture, left 08/09/2020   Dysphagia 05/20/2019   Encounter for other preprocedural examination 05/20/2019   PCOS (polycystic ovarian syndrome) 02/24/2019   Neck pain 02/24/2019   Morbid obesity with body mass index (BMI) of 45.0 to 49.9 in adult [facility]) 02/24/2019   Essential hypertension 02/24/2019   Gastroesophageal reflux disease 02/24/2019   Irregular menstrual cycle 01/14/2019   Abnormal uterine bleeding 01/14/2019   Chronic cough 01/14/2019    Reena Borromeo, OT 05/10/2021, 10:02 AM Keene Breath, OTR/L [phone] Phone: [phone] 10:02 AM 05/10/21  [facility] [address] [place], [place], [address] Phone: [phone]   Fax:  [phone]  Name: Ann Moran MRN: [id number] Date of Birth: Aug 24, 1985

## 2021-05-12 ENCOUNTER — Ambulatory Visit: Payer: Medicaid Other | Admitting: Occupational Therapy

## 2021-05-15 ENCOUNTER — Ambulatory Visit: Payer: Medicaid Other | Admitting: Occupational Therapy

## 2021-05-15 ENCOUNTER — Encounter: Payer: Self-pay | Admitting: Occupational Therapy

## 2021-05-15 ENCOUNTER — Other Ambulatory Visit: Payer: Self-pay

## 2021-05-15 DIAGNOSIS — M25631 Stiffness of right wrist, not elsewhere classified: Secondary | ICD-10-CM

## 2021-05-15 DIAGNOSIS — M6281 Muscle weakness (generalized): Secondary | ICD-10-CM

## 2021-05-15 DIAGNOSIS — M25531 Pain in right wrist: Secondary | ICD-10-CM | POA: Diagnosis not present

## 2021-05-15 NOTE — Therapy (Signed)
[facility] [address] [place], [place], [address] Phone: [phone]   Fax:  [phone]  Occupational Therapy Treatment  Patient Details  Name: Ann Moran MRN: [id number] Date of Birth: 31-Oct-1985 Referring Provider (OT): Dr. Pearletha Forge   Encounter Date: 05/15/2021   OT End of Session - 05/15/21 1207     Visit Number 3    Number of Visits 12    Date for OT Re-Evaluation 07/07/21    Authorization Type Medicaid    Authorization - Visit Number 2    Authorization - Number of Visits 12    OT Start Time 1157   pt arrival time   OT Stop Time 1230    OT Time Calculation (min) 33 min    Activity Tolerance Patient tolerated treatment well    Behavior During Therapy [facility] for tasks assessed/performed             Past Medical History:  Diagnosis Date   Allergy    Anemia    Anxiety    Chlamydia    Depression    hx of meds, none currently- doing ok   Diabetes mellitus without complication (HCC)    GERD (gastroesophageal reflux disease)    Infection due to trichomonas    MRSA (methicillin resistant staph aureus) culture positive    Dec 2012   Obesity    Pregnancy induced hypertension    Urinary tract infection     Past Surgical History:  Procedure Laterality Date   CHOLECYSTECTOMY     ECTOPIC PREGNANCY SURGERY      There were no vitals filed for this visit.   Subjective Assessment - 05/15/21 1207     Subjective  Pt reports no changes with Ionto #1 and no relief with new splint. Will continue to address with immobilization and continued ionto for increased comfort.    Patient Stated Goals not to have wrist pain with use    Currently in Pain? Yes    Pain Score 6     Pain Location Wrist    Pain Orientation Right    Pain Descriptors / Indicators Aching    Pain Type Chronic pain    Pain Onset 1 to 4 weeks ago    Pain Frequency Intermittent                          OT  Treatments/Exercises (OP) - 05/15/21 1208       Modalities   Modalities Iontophoresis      Iontophoresis   Type of Iontophoresis Dexamethasone    Location right radial wrist    Dose 1.5 cc, small pad, intensity 1.8-2.0    Time 21 mins, 7 min set up                      OT Short Term Goals - 04/28/21 1411       OT SHORT TERM GOAL #1   Title I with inital HEP.    Baseline dependent    Time 4    Period Weeks    Status New    Target Date 05/26/21      OT SHORT TERM GOAL #2   Title I with splint wear, care and precautions    Baseline Pt has splint but she is not wearing regularly, needs education    Time 4    Period Weeks    Status New      OT SHORT  TERM GOAL #3   Title I with positioning/  activity modification to minimize pain    Baseline dependent    Time 4    Period Weeks    Status New               OT Long Term Goals - 04/28/21 1412       OT LONG TERM GOAL #1   Title I with updated HEP    Baseline dependent    Time 9    Period Weeks    Status New    Target Date 07/07/21      OT LONG TERM GOAL #2   Title Pt will report pain no greater than 3/10 with RUE functional use.    Baseline Pain 6/10 with repetative use    Time 9    Period Weeks    Status New      OT LONG TERM GOAL #3   Title Pt will report that she has resumed use of RUE as dominant hand for all activities.    Baseline Pt has to use LUE to assist when pain increases    Time 9    Period Weeks    Status New      OT LONG TERM GOAL #4   Title Pt will report increased ease with styling her dtr's hair and using a spray bottle.    Baseline Pt reports difficulty with these tasks, and increased pain    Time 9    Period Weeks    Status New                   Plan - 05/15/21 1212     Clinical Impression Statement Pt completed ionto # 2 with no adverse reactions. Pt came in wearing thumb spica/wrist splint today.    OT Occupational Profile and History Problem Focused  Assessment - Including review of records relating to presenting problem    Occupational performance deficits (Please refer to evaluation for details): ADL's;IADL's;Work;Social Participation;Leisure;Play    Body Structure / Function / Physical Skills ADL;UE functional use;Balance;Flexibility;Pain;FMC;ROM;Coordination;GMC;IADL;Decreased knowledge of use of DME;Strength;Dexterity    Rehab Potential Good    Clinical Decision Making Limited treatment options, no task modification necessary    Comorbidities Affecting Occupational Performance: May have comorbidities impacting occupational performance    Modification or Assistance to Complete Evaluation  No modification of tasks or assist necessary to complete eval    OT Frequency --   2x week x 3 weeks followed by 1x week x 6 weeks or 12 visits   OT Treatment/Interventions Self-care/ADL training;Ultrasound;Patient/family education;Balance training;Passive range of motion;Paraffin;Fluidtherapy;Splinting;Electrical Stimulation;Moist Heat;Therapeutic exercise;Manual Therapy;Therapeutic activities;Neuromuscular education;DME and/or AE instruction;Iontophoresis;Cryotherapy;Contrast Bath    Plan review P/ROM wrist flexion/ extension, and A/ROM thumb flexion/ extension separate for wrist movement, iontophoresis # 3, continue education in positioning and activity modification to minimize symptoms    Consulted and Agree with Plan of Care Patient             Patient will benefit from skilled therapeutic intervention in order to improve the following deficits and impairments:   Body Structure / Function / Physical Skills: ADL, UE functional use, Balance, Flexibility, Pain, FMC, ROM, Coordination, GMC, IADL, Decreased knowledge of use of DME, Strength, Dexterity       Visit Diagnosis: Pain in right wrist  Stiffness of right wrist, not elsewhere classified  Muscle weakness (generalized)    Problem List Patient Active Problem List   Diagnosis Date  Noted   Smoking 02/23/2021  Patellar tendinitis of both knees 01/27/2021   Pain in joint involving right ankle and foot 01/27/2021   Thoracic back pain 12/23/2020   Right elbow pain 12/23/2020   Chronic pain of right knee 12/23/2020   Ankle fracture, left 08/09/2020   Dysphagia 05/20/2019   Encounter for other preprocedural examination 05/20/2019   PCOS (polycystic ovarian syndrome) 02/24/2019   Neck pain 02/24/2019   Morbid obesity with body mass index (BMI) of 45.0 to 49.9 in adult [facility](HCC) 02/24/2019   Essential hypertension 02/24/2019   Gastroesophageal reflux disease 02/24/2019   Irregular menstrual cycle 01/14/2019   Abnormal uterine bleeding 01/14/2019   Chronic cough 01/14/2019    Junious DresserKirstyn M Chelli Yerkes, OT 05/15/2021, 12:55 PM  [facility] [address] [place], [place], [address] Phone: [phone]   Fax:  [phone]  Name: Fransico HimMelanie E Mccarley MRN: [id number] Date of Birth: 08/20/1985

## 2021-05-16 ENCOUNTER — Encounter: Payer: Medicaid Other | Admitting: Occupational Therapy

## 2021-05-17 ENCOUNTER — Other Ambulatory Visit: Payer: Self-pay

## 2021-05-17 ENCOUNTER — Ambulatory Visit: Payer: Medicaid Other | Admitting: Occupational Therapy

## 2021-05-17 DIAGNOSIS — M25531 Pain in right wrist: Secondary | ICD-10-CM | POA: Diagnosis not present

## 2021-05-17 DIAGNOSIS — M25631 Stiffness of right wrist, not elsewhere classified: Secondary | ICD-10-CM

## 2021-05-17 DIAGNOSIS — M6281 Muscle weakness (generalized): Secondary | ICD-10-CM

## 2021-05-17 NOTE — Therapy (Signed)
[facility] [address] [place], [place], [address] Phone: [phone]   Fax:  [phone]  Occupational Therapy Treatment  Patient Details  Name: Ann Moran MRN: [id number] Date of Birth: 1985-06-04 Referring Provider (OT): Dr. Barbaraann Barthel   Encounter Date: 05/17/2021   OT End of Session - 05/17/21 0920     Visit Number 4    Number of Visits 12    Date for OT Re-Evaluation 07/07/21    Authorization Type [employer] Medicaid - auth needed after 12th visit    Authorization - Visit Number 3    Authorization - Number of Visits 12    OT Start Time [phone]    OT Stop Time 0935    OT Time Calculation (min) 45 min    Activity Tolerance Patient tolerated treatment well    Behavior During Therapy [facility] for tasks assessed/performed             Past Medical History:  Diagnosis Date   Allergy    Anemia    Anxiety    Chlamydia    Depression    hx of meds, none currently- doing ok   Diabetes mellitus without complication ([place])    GERD (gastroesophageal reflux disease)    Infection due to trichomonas    MRSA (methicillin resistant staph aureus) culture positive    Dec 2012   Obesity    Pregnancy induced hypertension    Urinary tract infection     Past Surgical History:  Procedure Laterality Date   CHOLECYSTECTOMY     ECTOPIC PREGNANCY SURGERY      There were no vitals filed for this visit.   Subjective Assessment - 05/17/21 0852     Subjective  The ionto last treatment seemed to make it worse but I was also lifting a lot afterwards which may have contributed to increased pain    Patient Stated Goals not to have wrist pain with use    Currently in Pain? Yes    Pain Score 6     Pain Location Wrist    Pain Orientation Right    Pain Descriptors / Indicators Throbbing    Pain Type Chronic pain    Pain Onset 1 to 4 weeks ago    Pain Frequency Intermittent    Aggravating Factors  overuse    Pain Relieving  Factors rest               Pt reports pain volar wrist today (as well as radially) - pt reports brace is somewhat uncomfortable. Therapist removed metal stay at volar wrist only (thumb stays still in) and asked if that helped increase comfort - pt reported it was more comfortable. Therapist instructed to try this but keep metal stay if needed.   Myofascial release and soft tissue mobs performed while discussing task modifications and A/E recommendations to help decrease pain.   Wrist flex/ext x 10 reps each (thumb relaxed), followed by thumb flex and radial abd x 10 reps with wrist neutral.   Ionto treatment #3 x 20 min total (2.0 mA, 1.5 cc dexamethasone) at radial base of thumb (ground electrode at proximal forearm)                    OT Education - 05/17/21 0919     Education Details Discussed task modifications and A/E recommendations (electric can opener, jar opener)    Person(s) Educated Patient    Methods Explanation    Comprehension Verbalized understanding  OT Short Term Goals - 05/17/21 9678       OT SHORT TERM GOAL #1   Title I with inital HEP.    Baseline dependent    Time 4    Period Weeks    Status Achieved    Target Date 05/26/21      OT SHORT TERM GOAL #2   Title I with splint wear, care and precautions    Baseline Pt has splint but she is not wearing regularly, needs education    Time 4    Period Weeks    Status Achieved      OT SHORT TERM GOAL #3   Title I with positioning/  activity modification to minimize pain    Baseline dependent    Time 4    Period Weeks    Status On-going               OT Long Term Goals - 04/28/21 1412       OT LONG TERM GOAL #1   Title I with updated HEP    Baseline dependent    Time 9    Period Weeks    Status New    Target Date 07/07/21      OT LONG TERM GOAL #2   Title Pt will report pain no greater than 3/10 with RUE functional use.    Baseline Pain 6/10 with  repetative use    Time 9    Period Weeks    Status New      OT LONG TERM GOAL #3   Title Pt will report that she has resumed use of RUE as dominant hand for all activities.    Baseline Pt has to use LUE to assist when pain increases    Time 9    Period Weeks    Status New      OT LONG TERM GOAL #4   Title Pt will report increased ease with styling her dtr's hair and using a spray bottle.    Baseline Pt reports difficulty with these tasks, and increased pain    Time 9    Period Weeks    Status New                   Plan - 05/17/21 0921     Clinical Impression Statement Pt completed ionto # 3 with no adverse reactions. Pt has met 2 STG's at this time    OT Occupational Profile and History Problem Focused Assessment - Including review of records relating to presenting problem    Occupational performance deficits (Please refer to evaluation for details): ADL's;IADL's;Work;Social Participation;Leisure;Play    Body Structure / Function / Physical Skills ADL;UE functional use;Balance;Flexibility;Pain;FMC;ROM;Coordination;GMC;IADL;Decreased knowledge of use of DME;Strength;Dexterity    Rehab Potential Good    Clinical Decision Making Limited treatment options, no task modification necessary    Comorbidities Affecting Occupational Performance: May have comorbidities impacting occupational performance    Modification or Assistance to Complete Evaluation  No modification of tasks or assist necessary to complete eval    OT Frequency --   2x week x 3 weeks followed by 1x week x 6 weeks or 12 visits   OT Treatment/Interventions Self-care/ADL training;Ultrasound;Patient/family education;Balance training;Passive range of motion;Paraffin;Fluidtherapy;Splinting;Electrical Stimulation;Moist Heat;Therapeutic exercise;Manual Therapy;Therapeutic activities;Neuromuscular education;DME and/or AE instruction;Iontophoresis;Cryotherapy;Contrast Bath    Plan ionto #4, continue to reinforce activity  modifications and A/E, continue soft tissue mobs along radial forearm    Consulted and Agree with Plan of Care Patient  Patient will benefit from skilled therapeutic intervention in order to improve the following deficits and impairments:   Body Structure / Function / Physical Skills: ADL, UE functional use, Balance, Flexibility, Pain, FMC, ROM, Coordination, GMC, IADL, Decreased knowledge of use of DME, Strength, Dexterity       Visit Diagnosis: Pain in right wrist  Stiffness of right wrist, not elsewhere classified  Muscle weakness (generalized)    Problem List Patient Active Problem List   Diagnosis Date Noted   Smoking 02/23/2021   Patellar tendinitis of both knees 01/27/2021   Pain in joint involving right ankle and foot 01/27/2021   Thoracic back pain 12/23/2020   Right elbow pain 12/23/2020   Chronic pain of right knee 12/23/2020   Ankle fracture, left 08/09/2020   Dysphagia 05/20/2019   Encounter for other preprocedural examination 05/20/2019   PCOS (polycystic ovarian syndrome) 02/24/2019   Neck pain 02/24/2019   Morbid obesity with body mass index (BMI) of 45.0 to 49.9 in adult ([place]) 02/24/2019   Essential hypertension 02/24/2019   Gastroesophageal reflux disease 02/24/2019   Irregular menstrual cycle 01/14/2019   Abnormal uterine bleeding 01/14/2019   Chronic cough 01/14/2019    Carey Bullocks, OTR/L 05/17/2021, 9:23 AM  [facility] [address] [place], [place], [address] Phone: [phone]   Fax:  [phone]  Name: MARDELLE PANDOLFI MRN: [id number] Date of Birth: Nov 21, 1985

## 2021-05-18 ENCOUNTER — Encounter: Payer: Medicaid Other | Admitting: Occupational Therapy

## 2021-05-22 ENCOUNTER — Ambulatory Visit: Payer: Medicaid Other | Admitting: Occupational Therapy

## 2021-05-23 ENCOUNTER — Encounter: Payer: Medicaid Other | Admitting: Occupational Therapy

## 2021-05-24 ENCOUNTER — Ambulatory Visit: Payer: Medicaid Other | Admitting: Occupational Therapy

## 2021-05-24 ENCOUNTER — Ambulatory Visit: Payer: Medicaid Other | Attending: Family Medicine | Admitting: Occupational Therapy

## 2021-05-24 ENCOUNTER — Other Ambulatory Visit: Payer: Self-pay

## 2021-05-24 ENCOUNTER — Encounter: Payer: Self-pay | Admitting: Family Medicine

## 2021-05-24 ENCOUNTER — Ambulatory Visit: Payer: Self-pay | Admitting: *Deleted

## 2021-05-24 DIAGNOSIS — M25631 Stiffness of right wrist, not elsewhere classified: Secondary | ICD-10-CM | POA: Diagnosis present

## 2021-05-24 DIAGNOSIS — M25531 Pain in right wrist: Secondary | ICD-10-CM | POA: Diagnosis not present

## 2021-05-24 NOTE — Therapy (Signed)
[facility] [place], [place], [address] Phone: [phone]   Fax:  [phone]  Occupational Therapy Treatment  Patient Details  Name: Ann Moran MRN: [id number] Date of Birth: [birth year] Referring Provider (OT): Dr. Barbaraann Barthel   Encounter Date: 05/24/2021   OT End of Session - 05/24/21 0912     Visit Number 5    Number of Visits 12    Date for OT Re-Evaluation 07/07/21    Authorization Type [employer] - auth needed after 12th visit    Authorization - Visit Number 4    Authorization - Number of Visits 12    OT Start Time [phone]    OT Stop Time 0930    OT Time Calculation (min) 40 min    Activity Tolerance Patient tolerated treatment well    Behavior During Therapy [facility] for tasks assessed/performed             Past Medical History:  Diagnosis Date   Allergy    Anemia    Anxiety    Chlamydia    Depression    hx of meds, none currently- doing ok   Diabetes mellitus without complication (HCC)    GERD (gastroesophageal reflux disease)    Infection due to trichomonas    MRSA (methicillin resistant staph aureus) culture positive    Dec 2012   Obesity    Pregnancy induced hypertension    Urinary tract infection     Past Surgical History:  Procedure Laterality Date   CHOLECYSTECTOMY     ECTOPIC PREGNANCY SURGERY      There were no vitals filed for this visit.   Subjective Assessment - 05/24/21 0850     Subjective  The brace is a little better (w/o the metal stay at volar wrist). No improvements yet with ionto    Patient Stated Goals not to have wrist pain with use    Currently in Pain? Yes    Pain Score 6     Pain Location Wrist   radially   Pain Orientation Right    Pain Descriptors / Indicators Throbbing    Pain Type Chronic pain    Pain Onset 1 to 4 weeks ago    Pain Frequency Intermittent    Aggravating Factors  overuse    Pain Relieving Factors rest             Continued task modification education: discussed use of ergonomic knives or Rt angled knives, or hand chopper. Also discussed task modifications for wringing out washcloth and turning keys.   Myofascial release and soft tissue mobs along radial forearm w/ noted tightness.   Iontophoresis #4 at radial wrist, 2.0 mA x 20 minutes                        OT Short Term Goals - 05/17/21 8144       OT SHORT TERM GOAL #1   Title I with inital HEP.    Baseline dependent    Time 4    Period Weeks    Status Achieved    Target Date 05/26/21      OT SHORT TERM GOAL #2   Title I with splint wear, care and precautions    Baseline Pt has splint but she is not wearing regularly, needs education    Time 4    Period Weeks    Status Achieved      OT SHORT TERM GOAL #3  Title I with positioning/  activity modification to minimize pain    Baseline dependent    Time 4    Period Weeks    Status On-going               OT Long Term Goals - 04/28/21 1412       OT LONG TERM GOAL #1   Title I with updated HEP    Baseline dependent    Time 9    Period Weeks    Status New    Target Date 07/07/21      OT LONG TERM GOAL #2   Title Pt will report pain no greater than 3/10 with RUE functional use.    Baseline Pain 6/10 with repetative use    Time 9    Period Weeks    Status New      OT LONG TERM GOAL #3   Title Pt will report that she has resumed use of RUE as dominant hand for all activities.    Baseline Pt has to use LUE to assist when pain increases    Time 9    Period Weeks    Status New      OT LONG TERM GOAL #4   Title Pt will report increased ease with styling her dtr's hair and using a spray bottle.    Baseline Pt reports difficulty with these tasks, and increased pain    Time 9    Period Weeks    Status New                   Plan - 05/24/21 0913     Clinical Impression Statement Pt completed ionto #4 with no adverse reactions, however  pt reports no relief from pain. Pt has met 2 STG's at this time    OT Occupational Profile and History Problem Focused Assessment - Including review of records relating to presenting problem    Occupational performance deficits (Please refer to evaluation for details): ADL's;IADL's;Work;Social Participation;Leisure;Play    Body Structure / Function / Physical Skills ADL;UE functional use;Balance;Flexibility;Pain;FMC;ROM;Coordination;GMC;IADL;Decreased knowledge of use of DME;Strength;Dexterity    Rehab Potential Good    Clinical Decision Making Limited treatment options, no task modification necessary    Comorbidities Affecting Occupational Performance: May have comorbidities impacting occupational performance    Modification or Assistance to Complete Evaluation  No modification of tasks or assist necessary to complete eval    OT Frequency --   2x week x 3 weeks followed by 1x week x 6 weeks or 12 visits   OT Treatment/Interventions Self-care/ADL training;Ultrasound;Patient/family education;Balance training;Passive range of motion;Paraffin;Fluidtherapy;Splinting;Electrical Stimulation;Moist Heat;Therapeutic exercise;Manual Therapy;Therapeutic activities;Neuromuscular education;DME and/or AE instruction;Iontophoresis;Cryotherapy;Contrast Bath    Plan ionto #5 if pain better, consider pulsed [place] instead if no better, continue soft tissue mobs along radial forearm, gentle ROM    Consulted and Agree with Plan of Care Patient             Patient will benefit from skilled therapeutic intervention in order to improve the following deficits and impairments:   Body Structure / Function / Physical Skills: ADL, UE functional use, Balance, Flexibility, Pain, FMC, ROM, Coordination, GMC, IADL, Decreased knowledge of use of DME, Strength, Dexterity       Visit Diagnosis: Pain in right wrist  Stiffness of right wrist, not elsewhere classified    Problem List Patient Active Problem List   Diagnosis  Date Noted   Smoking 02/23/2021   Patellar tendinitis of both knees 01/27/2021  Pain in joint involving right ankle and foot 01/27/2021   Thoracic back pain 12/23/2020   Right elbow pain 12/23/2020   Chronic pain of right knee 12/23/2020   Ankle fracture, left 08/09/2020   Dysphagia 05/20/2019   Encounter for other preprocedural examination 05/20/2019   PCOS (polycystic ovarian syndrome) 02/24/2019   Neck pain 02/24/2019   Morbid obesity with body mass index (BMI) of 45.0 to 49.9 in adult [facility]) 02/24/2019   Essential hypertension 02/24/2019   Gastroesophageal reflux disease 02/24/2019   Irregular menstrual cycle 01/14/2019   Abnormal uterine bleeding 01/14/2019   Chronic cough 01/14/2019    Carey Bullocks, OTR/L 05/24/2021, [facility] [address] [place], [place], [address] Phone: [phone]   Fax:  [phone]  Name: NIKOLETTE REINDL MRN: [id number] Date of Birth: [birth year]

## 2021-05-24 NOTE — Telephone Encounter (Signed)
°  Chief Complaint: Headache Symptoms: 7/10 headache at temples. Unrelieved with multiple OTC meds, photophobia Frequency: 5 days ago Pertinent Negatives: Patient denies fever, congestion,dizziness Disposition: [] ED /[] Urgent Care (no appt availability in office) / [] Appointment(In office/virtual)/ []  [facility]/ [] Home Care/ [] Refused Recommended Disposition /[x] [facility]/ []  Follow-up with PCP Additional Notes: Has not checked BP for a week. "It's always high though." No missed meds.

## 2021-05-24 NOTE — Telephone Encounter (Signed)
Reason for Disposition  [1] MODERATE headache (e.g., interferes with normal activities) AND [2] present > 24 hours AND [3] unexplained  (Exceptions: analgesics not tried, typical migraine, or headache part of viral illness)  Answer Assessment - Initial Assessment Questions 1. LOCATION: "Where does it hurt?"      Temples 2. ONSET: "When did the headache start?" (Minutes, hours or days)      5 days ago 3. PATTERN: "Does the pain come and go, or has it been constant since it started?"     Constant 4. SEVERITY: "How bad is the pain?" and "What does it keep you from doing?"  (e.g., Scale 1-10; mild, moderate, or severe)   - MILD (1-3): doesn't interfere with normal activities    - MODERATE (4-7): interferes with normal activities or awakens from sleep    - SEVERE (8-10): excruciating pain, unable to do any normal activities        7/10 5. RECURRENT SYMPTOM: "Have you ever had headaches before?" If Yes, ask: "When was the last time?" and "What happened that time?"      no 6. CAUSE: "What do you think is causing the headache?"     Unsure 7. MIGRAINE: "Have you been diagnosed with migraine headaches?" If Yes, ask: "Is this headache similar?"      no 8. HEAD INJURY: "Has there been any recent injury to the head?"      no 9. OTHER SYMPTOMS: "Do you have any other symptoms?" (fever, stiff neck, eye pain, sore throat, cold symptoms)     No  Protocols used: Headache-A-AH

## 2021-05-30 ENCOUNTER — Ambulatory Visit: Payer: Medicaid Other | Admitting: Occupational Therapy

## 2021-05-30 ENCOUNTER — Encounter: Payer: Medicaid Other | Admitting: Occupational Therapy

## 2021-05-31 ENCOUNTER — Ambulatory Visit: Payer: Medicaid Other | Admitting: Occupational Therapy

## 2021-06-06 ENCOUNTER — Encounter: Payer: Medicaid Other | Admitting: Occupational Therapy

## 2021-06-07 ENCOUNTER — Other Ambulatory Visit: Payer: Self-pay

## 2021-06-07 ENCOUNTER — Ambulatory Visit: Payer: Medicaid Other | Admitting: Occupational Therapy

## 2021-06-07 NOTE — Therapy (Signed)
[facility] [address] [place], [place], [address] Phone: [phone]   Fax:  [phone]  Patient Details  Name: Ann Moran MRN: [id number] Date of Birth: 1986-04-23 Referring Provider:  Lenda Kelp, MD  Encounter Date: 06/07/2021 Pt arrived stating her dtr who was accompanying her had just tested + for Covid. Pt left and therapist contacted her by phone. Pt's next week appointment was cancelled and pt was instructed that she should call and cancel if she is still exhibiting significant covid symptoms. Therapist reinforced the importance of mask wearing for covid prevention as pt's dtr did not have a mask on in waiting room initally.  Nare Gaspari, OT 06/07/2021, 9:09 AM  [facility] [address] [place], [place], [address] Phone: [phone]   Fax:  [phone]

## 2021-06-11 ENCOUNTER — Ambulatory Visit: Payer: Medicaid Other

## 2021-06-13 ENCOUNTER — Encounter: Payer: Medicaid Other | Admitting: Occupational Therapy

## 2021-06-14 ENCOUNTER — Encounter: Payer: Medicaid Other | Admitting: Occupational Therapy

## 2021-06-20 ENCOUNTER — Encounter: Payer: Medicaid Other | Admitting: Occupational Therapy

## 2021-06-21 ENCOUNTER — Ambulatory Visit: Payer: Medicaid Other | Attending: Family Medicine | Admitting: Occupational Therapy

## 2021-06-21 ENCOUNTER — Other Ambulatory Visit: Payer: Self-pay

## 2021-06-21 ENCOUNTER — Encounter: Payer: Self-pay | Admitting: Occupational Therapy

## 2021-06-21 DIAGNOSIS — M6281 Muscle weakness (generalized): Secondary | ICD-10-CM | POA: Diagnosis present

## 2021-06-21 DIAGNOSIS — M25531 Pain in right wrist: Secondary | ICD-10-CM | POA: Insufficient documentation

## 2021-06-21 DIAGNOSIS — M25631 Stiffness of right wrist, not elsewhere classified: Secondary | ICD-10-CM | POA: Diagnosis present

## 2021-06-21 NOTE — Therapy (Signed)
[place] ?[place] ?[place], [place], [address] ?Phone: [phone]   Fax:  [phone] ? ?Occupational Therapy Treatment ? ?Patient Details  ?Name: Ann Moran ?MRN: [id number] ?Date of Birth: 1986/02/02 ?Referring Provider (OT): Dr. Barbaraann Barthel ? ? ?Encounter Date: 06/21/2021 ? ? OT End of Session - 06/21/21 1702   ? ? Visit Number 6   ? Number of Visits 12   ? Date for OT Re-Evaluation 07/07/21   ? Authorization Type [employer] - auth needed after 12th visit   ? Authorization - Visit Number 5   ? Authorization - Number of Visits 12   ? OT Start Time 1615   ? OT Stop Time 1655   ? OT Time Calculation (min) 40 min   ? Activity Tolerance Patient tolerated treatment well   ? Behavior During Therapy [facility] for tasks assessed/performed   ? ?  ?  ? ?  ? ? ?Past Medical History:  ?Diagnosis Date  ? Allergy   ? Anemia   ? Anxiety   ? Chlamydia   ? Depression   ? hx of meds, none currently- doing ok  ? Diabetes mellitus without complication ([place])   ? GERD (gastroesophageal reflux disease)   ? Infection due to trichomonas   ? MRSA (methicillin resistant staph aureus) culture positive   ? Dec 2012  ? Obesity   ? Pregnancy induced hypertension   ? Urinary tract infection   ? ? ?Past Surgical History:  ?Procedure Laterality Date  ? CHOLECYSTECTOMY    ? ECTOPIC PREGNANCY SURGERY    ? ? ?There were no vitals filed for this visit. ? ? Subjective Assessment - 06/21/21 1619   ? ? Subjective  It aches a little after I have used it.  Sometimes I push through and then it hurts   ? Patient is accompanied by: Family member   ? Patient Stated Goals not to have wrist pain with use   ? Currently in Pain? No/denies   ? Pain Score 0-No pain   ? ?  ?  ? ?  ? ? ? ? ? ? ? ? ? ? ? ? ? ? ? OT Treatments/Exercises (OP) - 06/21/21 0001   ? ?  ? ADLs  ? Grooming Patient reports she is still unable to braid her hair.  Instructed patient to attempt to prop elbows on countertops  (shoulder fatigue is limiting braiding) to allow hands, wrists and forearms to work.   ? Home Maintenance Patient had been sick so reportedly was using her right arm less and came to session today without pain and wearing thumb splint.  Patient reports she is only wearing the splint when she has to drive - or when she is really using her hand.  Encouraged patient to use hand but respect discomfort as need for rest.   ? ADL Comments Patient reports little improvement from iontophoresis - and has had gap in service due to illness - opted for pulsed ultrasound today.   ?  ? Sensation Exercises  ? Desensitization Rolling forearm over massage ball for soft tissue mobilization anddesensitization.   ?  ? Modalities  ? Modalities Ultrasound   ?  ? Ultrasound  ? Ultrasound Location 80mhz, 20%pulsed, 0.8w/cm2 x 10 min volar and lateral forearm.   ?  ? Manual Therapy  ? Manual Therapy Soft tissue mobilization   ? Soft tissue mobilization distal fascia between radius and ulna volarly.   ? ?  ?  ? ?  ? ? ? ? ? ? ? ? ? ? ?  OT Short Term Goals - 06/21/21 1704   ? ?  ? OT SHORT TERM GOAL #1  ? Title I with inital HEP.   ? Baseline dependent   ? Time 4   ? Period Weeks   ? Status Achieved   ? Target Date 05/26/21   ?  ? OT SHORT TERM GOAL #2  ? Title I with splint wear, care and precautions   ? Baseline Pt has splint but she is not wearing regularly, needs education   ? Time 4   ? Period Weeks   ? Status Achieved   ?  ? OT SHORT TERM GOAL #3  ? Title I with positioning/  activity modification to minimize pain   ? Baseline dependent   ? Time 4   ? Period Weeks   ? Status Achieved   ? ?  ?  ? ?  ? ? ? ? OT Long Term Goals - 06/21/21 1704   ? ?  ? OT LONG TERM GOAL #1  ? Title I with updated HEP   ? Baseline dependent   ? Time 9   ? Period Weeks   ? Status On-going   ? Target Date 07/07/21   ?  ? OT LONG TERM GOAL #2  ? Title Pt will report pain no greater than 3/10 with RUE functional use.   ? Baseline Pain 6/10 with repetative use   ?  Time 9   ? Period Weeks   ? Status On-going   ?  ? OT LONG TERM GOAL #3  ? Title Pt will report that she has resumed use of RUE as dominant hand for all activities.   ? Baseline Pt has to use LUE to assist when pain increases   ? Time 9   ? Period Weeks   ? Status On-going   ?  ? OT LONG TERM GOAL #4  ? Title Pt will report increased ease with styling her dtr's hair and using a spray bottle.   ? Baseline Pt reports difficulty with these tasks, and increased pain   ? Time 9   ? Period Weeks   ? Status On-going   ? ?  ?  ? ?  ? ? ? ? ? ? ? ? Plan - 06/21/21 1703   ? ? Clinical Impression Statement Pt arrived today without pain in forearm.  She continues to use right arm, and alternate work and rest periods.   ? OT Occupational Profile and History Problem Focused Assessment - Including review of records relating to presenting problem   ? Occupational performance deficits (Please refer to evaluation for details): ADL's;IADL's;Work;Social Participation;Leisure;Play   ? Body Structure / Function / Physical Skills ADL;UE functional use;Balance;Flexibility;Pain;FMC;ROM;Coordination;GMC;IADL;Decreased knowledge of use of DME;Strength;Dexterity   ? Rehab Potential Good   ? Clinical Decision Making Limited treatment options, no task modification necessary   ? Comorbidities Affecting Occupational Performance: May have comorbidities impacting occupational performance   ? Modification or Assistance to Complete Evaluation  No modification of tasks or assist necessary to complete eval   ? OT Frequency --   2x week x 3 weeks followed by 1x week x 6 weeks or 12 visits  ? OT Treatment/Interventions Self-care/ADL training;Ultrasound;Patient/family education;Balance training;Passive range of motion;Paraffin;Fluidtherapy;Splinting;Electrical Stimulation;Moist Heat;Therapeutic exercise;Manual Therapy;Therapeutic activities;Neuromuscular education;DME and/or AE instruction;Iontophoresis;Cryotherapy;Contrast Bath   ? Plan pulsed [place] ,  continue soft tissue mobs along radial forearm, gentle ROM   ? Consulted and Agree with Plan of Care Patient   ? ?  ?  ? ?  ? ? ?  Patient will benefit from skilled therapeutic intervention in order to improve the following deficits and impairments:   ?Body Structure / Function / Physical Skills: ADL, UE functional use, Balance, Flexibility, Pain, FMC, ROM, Coordination, GMC, IADL, Decreased knowledge of use of DME, Strength, Dexterity ?  ?  ? ? ?Visit Diagnosis: ?Pain in right wrist ? ?Stiffness of right wrist, not elsewhere classified ? ?Muscle weakness (generalized) ? ? ? ?Problem List ?Patient Active Problem List  ? Diagnosis Date Noted  ? Smoking 02/23/2021  ? Patellar tendinitis of both knees 01/27/2021  ? Pain in joint involving right ankle and foot 01/27/2021  ? Thoracic back pain 12/23/2020  ? Right elbow pain 12/23/2020  ? Chronic pain of right knee 12/23/2020  ? Ankle fracture, left 08/09/2020  ? Dysphagia 05/20/2019  ? Encounter for other preprocedural examination 05/20/2019  ? PCOS (polycystic ovarian syndrome) 02/24/2019  ? Neck pain 02/24/2019  ? Morbid obesity with body mass index (BMI) of 45.0 to 49.9 in adult [facility]) 02/24/2019  ? Essential hypertension 02/24/2019  ? Gastroesophageal reflux disease 02/24/2019  ? Irregular menstrual cycle 01/14/2019  ? Abnormal uterine bleeding 01/14/2019  ? Chronic cough 01/14/2019  ? ? ?Mariah Milling, OT ?06/21/2021, 5:06 PM ? ?[place] ?[place] ?[place], [place], [address] ?Phone: [phone]   Fax:  [phone] ? ?Name: NISHKA SALINAS ?MRN: [id number] ?Date of Birth: [birth year] ? ?

## 2021-06-23 ENCOUNTER — Encounter: Payer: Self-pay | Admitting: Occupational Therapy

## 2021-06-28 ENCOUNTER — Ambulatory Visit: Payer: Medicaid Other | Admitting: Occupational Therapy

## 2021-06-28 ENCOUNTER — Other Ambulatory Visit: Payer: Self-pay

## 2021-06-28 ENCOUNTER — Encounter: Payer: Self-pay | Admitting: Occupational Therapy

## 2021-06-28 DIAGNOSIS — M25631 Stiffness of right wrist, not elsewhere classified: Secondary | ICD-10-CM

## 2021-06-28 DIAGNOSIS — M6281 Muscle weakness (generalized): Secondary | ICD-10-CM

## 2021-06-28 DIAGNOSIS — M25531 Pain in right wrist: Secondary | ICD-10-CM

## 2021-06-28 NOTE — Therapy (Signed)
[place] ?[facility] ?[address] ?[place], [place], [address] ?Phone: [phone]   Fax:  [phone] ? ?Occupational Therapy Treatment ? ?Patient Details  ?Name: Ann Moran ?MRN: [id number] ?Date of Birth: Sep 15, 1985 ?Referring Provider (OT): Dr. Pearletha Forge ? ? ?Encounter Date: 06/28/2021 ? ? OT End of Session - 06/28/21 0956   ? ? Visit Number 7   ? Number of Visits 12   ? Date for OT Re-Evaluation 07/07/21   ? Authorization Type [employer] - auth needed after 12th visit   ? Authorization - Visit Number 6   ? Authorization - Number of Visits 12   ? OT Start Time [phone]   ? OT Stop Time 0930   ? OT Time Calculation (min) 41 min   ? ?  ?  ? ?  ? ? ?Past Medical History:  ?Diagnosis Date  ? Allergy   ? Anemia   ? Anxiety   ? Chlamydia   ? Depression   ? hx of meds, none currently- doing ok  ? Diabetes mellitus without complication ([facility])   ? GERD (gastroesophageal reflux disease)   ? Infection due to trichomonas   ? MRSA (methicillin resistant staph aureus) culture positive   ? Dec 2012  ? Obesity   ? Pregnancy induced hypertension   ? Urinary tract infection   ? ? ?Past Surgical History:  ?Procedure Laterality Date  ? CHOLECYSTECTOMY    ? ECTOPIC PREGNANCY SURGERY    ? ? ?There were no vitals filed for this visit. ? ? Subjective Assessment - 06/28/21 0955   ? ? Subjective  Pt reports that her pain   ? Patient is accompanied by: Family member   ? Patient Stated Goals not to have wrist pain with use   ? Currently in Pain? Yes   ? Pain Score 6    ? Pain Location Wrist   ? Pain Orientation Right   ? Pain Descriptors / Indicators Aching   ? Pain Type Chronic pain   ? Pain Onset 1 to 4 weeks ago   ? Pain Frequency Intermittent   ? Aggravating Factors  overuse   ? Pain Relieving Factors heat, [place]   ? ?  ?  ? ?  ? ? ? ? ? ? ?treatment: ? Modalities  ? Modalities Ultrasound   ?  ? Ultrasound  ? Ultrasound Location , 20%pulsed, 0.8w/cm2 x 8 min volar and lateral  forearm.   ?  ? Manual Therapy  ? Manual Therapy Soft tissue mobilization with tennis ball and massage ball  ? Soft tissue mobilization distal fascia between radius and ulna volarly.   ? ? ? ?Paraffin x 10 mins to right hand and wrist, no adverse reactions. Pt was pain free at end of session. ? ? ? ? ? ? ? ? ? ? ? ? ? OT Education - 06/28/21 1000   ? ? Education Details updates to HEP, see pt instructions, pt returned demonstration , splint wear and activity modification.  ? Person(s) Educated Patient   ? Methods Explanation;Demonstration   ? Comprehension Verbalized understanding;Returned demonstration;Verbal cues required   ? ?  ?  ? ?  ? ? ? OT Short Term Goals - 06/21/21 1704   ? ?  ? OT SHORT TERM GOAL #1  ? Title I with inital HEP.   ? Baseline dependent   ? Time 4   ? Period Weeks   ? Status Achieved   ? Target Date 05/26/21   ?  ?  OT SHORT TERM GOAL #2  ? Title I with splint wear, care and precautions   ? Baseline Pt has splint but she is not wearing regularly, needs education   ? Time 4   ? Period Weeks   ? Status Achieved   ?  ? OT SHORT TERM GOAL #3  ? Title I with positioning/  activity modification to minimize pain   ? Baseline dependent   ? Time 4   ? Period Weeks   ? Status Achieved   ? ?  ?  ? ?  ? ? ? ? OT Long Term Goals - 06/21/21 1704   ? ?  ? OT LONG TERM GOAL #1  ? Title I with updated HEP   ? Baseline dependent   ? Time 9   ? Period Weeks   ? Status On-going   ? Target Date 07/07/21   ?  ? OT LONG TERM GOAL #2  ? Title Pt will report pain no greater than 3/10 with RUE functional use.   ? Baseline Pain 6/10 with repetative use   ? Time 9   ? Period Weeks   ? Status On-going   ?  ? OT LONG TERM GOAL #3  ? Title Pt will report that she has resumed use of RUE as dominant hand for all activities.   ? Baseline Pt has to use LUE to assist when pain increases   ? Time 9   ? Period Weeks   ? Status On-going   ?  ? OT LONG TERM GOAL #4  ? Title Pt will report increased ease with styling her dtr's hair  and using a spray bottle.   ? Baseline Pt reports difficulty with these tasks, and increased pain   ? Time 9   ? Period Weeks   ? Status On-going   ? ?  ?  ? ?  ? ? ? ? ? ? ? ? Plan - 06/28/21 0958   ? ? Clinical Impression Statement Pt arrived today with pain in her right wrist from sleeping on it without brace. Pt reports overall her wrist pain is better and it doesn't hurt every day..   ? OT Occupational Profile and History Problem Focused Assessment - Including review of records relating to presenting problem   ? Occupational performance deficits (Please refer to evaluation for details): ADL's;IADL's;Work;Social Participation;Leisure;Play   ? Body Structure / Function / Physical Skills ADL;UE functional use;Balance;Flexibility;Pain;FMC;ROM;Coordination;GMC;IADL;Decreased knowledge of use of DME;Strength;Dexterity   ? Rehab Potential Good   ? Clinical Decision Making Limited treatment options, no task modification necessary   ? Comorbidities Affecting Occupational Performance: May have comorbidities impacting occupational performance   ? Modification or Assistance to Complete Evaluation  No modification of tasks or assist necessary to complete eval   ? OT Frequency --   2x week x 3 weeks followed by 1x week x 6 weeks or 12 visits  ? OT Treatment/Interventions Self-care/ADL training;Ultrasound;Patient/family education;Balance training;Passive range of motion;Paraffin;Fluidtherapy;Splinting;Electrical Stimulation;Moist Heat;Therapeutic exercise;Manual Therapy;Therapeutic activities;Neuromuscular education;DME and/or AE instruction;Iontophoresis;Cryotherapy;Contrast Bath   ? Plan pulsed US , consider fluidotherapy, check goals anticipate d/c next visit   ? Consulted and Agree with Plan of Care Patient   ? ?  ?  ? ?  ? ? ?Patient will benefit from skilled therapeutic intervention in order to improve the following deficits and impairments:   ?Body Structure / Function / Physical Skills: ADL, UE functional use,  Balance, Flexibility, Pain, FMC, ROM, Coordination, GMC, IADL, Decreased knowledge  of use of DME, Strength, Dexterity ?  ?  ? ? ?Visit Diagnosis: ?Muscle weakness (generalized) ? ?Stiffness of right wrist, not elsewhere classified ? ?Pain in right wrist ? ? ? ?Problem List ?Patient Active Problem List  ? Diagnosis Date Noted  ? Smoking 02/23/2021  ? Patellar tendinitis of both knees 01/27/2021  ? Pain in joint involving right ankle and foot 01/27/2021  ? Thoracic back pain 12/23/2020  ? Right elbow pain 12/23/2020  ? Chronic pain of right knee 12/23/2020  ? Ankle fracture, left 08/09/2020  ? Dysphagia 05/20/2019  ? Encounter for other preprocedural examination 05/20/2019  ? PCOS (polycystic ovarian syndrome) 02/24/2019  ? Neck pain 02/24/2019  ? Morbid obesity with body mass index (BMI) of 45.0 to 49.9 in adult [facility]) 02/24/2019  ? Essential hypertension 02/24/2019  ? Gastroesophageal reflux disease 02/24/2019  ? Irregular menstrual cycle 01/14/2019  ? Abnormal uterine bleeding 01/14/2019  ? Chronic cough 01/14/2019  ? ? ?Tangia Pinard, OT ?06/28/2021, 10:10 AM ? ?[place] ?[facility] ?[address] ?[place], [place], [address] ?Phone: [phone]   Fax:  [phone] ? ?Name: Ann Moran ?MRN: [id number] ?Date of Birth: December 20, 1985 ? ?

## 2021-06-28 NOTE — Patient Instructions (Addendum)
[employer]) ? ? ?Copyright ? VHI. All rights reserved.  ? ? ?Sitting with elbows on table and palms together, slowly lower wrists toward table until stretch is felt. Be sure to keep palms together throughout stretch. Hold _5-10___ seconds. Relax. ?Repeat ___5_ times. Do __2__ sessions per day. ? ?Copyright ? VHI. All rights reserved.  ?Extension (Active With Finger Extension) ? ? ? ?With forearm on table and wrist over edge, lift hand with fingers straight. Hold _3___ seconds. ?Repeat ___10_ times. Do ___3_ sessions per day. ? ?Copyright ? VHI. All rights reserved.  ? ?PROM: Wrist Radial / Ulnar Deviation ? ? ? ?Grasp right hand with other hand and gently stretch hand and wrist from side to side as far as possible. Hold each position ___5_ seconds. Relax. ?Repeat __5__ times per set. Do __1__ sets per session. Do __2__ sessions per day. ? ? ?

## 2021-06-30 ENCOUNTER — Other Ambulatory Visit: Payer: Self-pay

## 2021-06-30 ENCOUNTER — Ambulatory Visit: Payer: Medicaid Other | Admitting: Occupational Therapy

## 2021-06-30 DIAGNOSIS — M25531 Pain in right wrist: Secondary | ICD-10-CM | POA: Diagnosis not present

## 2021-06-30 DIAGNOSIS — M6281 Muscle weakness (generalized): Secondary | ICD-10-CM

## 2021-06-30 DIAGNOSIS — M25631 Stiffness of right wrist, not elsewhere classified: Secondary | ICD-10-CM

## 2021-06-30 NOTE — Therapy (Signed)
[place] ?[place] ?[place], [place], [address] ?Phone: [phone]   Fax:  [phone] ? ?Occupational Therapy Treatment ? ?Patient Details  ?Name: Ann Moran ?MRN: [id number] ?Date of Birth: [birth year] ?Referring Provider (OT): Dr. Barbaraann Barthel ? ? ?Encounter Date: 06/30/2021 ?OCCUPATIONAL THERAPY DISCHARGE SUMMARY ? ? ? ?Current functional level related to goals / functional outcomes: ?Pt met all goals. ?  ?Remaining deficits: ?Mild pain and tightness ?  ?Education / Equipment: ?Pt was educated regarding :activity modification,  splint wear, HEP. Pt verbalized understanding. ? ?Patient agrees to discharge. Patient goals were met. Patient is being discharged due to meeting the stated rehab goals.. ? ?  ? ? OT End of Session - 06/30/21 1041   ? ? Visit Number 8   ? Number of Visits 12   ? Date for OT Re-Evaluation 07/07/21   ? Authorization Type [employer] - auth needed after 12th visit   ? Authorization - Visit Number 7   ? Authorization - Number of Visits 12   ? OT Start Time 1017   ? OT Stop Time 1043   ? OT Time Calculation (min) 26 min   ? Activity Tolerance Patient tolerated treatment well   ? Behavior During Therapy [facility] for tasks assessed/performed   ? ?  ?  ? ?  ? ? ?Past Medical History:  ?Diagnosis Date  ? Allergy   ? Anemia   ? Anxiety   ? Chlamydia   ? Depression   ? hx of meds, none currently- doing ok  ? Diabetes mellitus without complication ([place])   ? GERD (gastroesophageal reflux disease)   ? Infection due to trichomonas   ? MRSA (methicillin resistant staph aureus) culture positive   ? Dec 2012  ? Obesity   ? Pregnancy induced hypertension   ? Urinary tract infection   ? ? ?Past Surgical History:  ?Procedure Laterality Date  ? CHOLECYSTECTOMY    ? ECTOPIC PREGNANCY SURGERY    ? ? ?There were no vitals filed for this visit. ? ? Subjective Assessment - 06/30/21 1248   ? ? Subjective  Pt agrees with d/c   ? Patient Stated  Goals not to have wrist pain with use   ? Currently in Pain? Yes   ? Pain Score 2    ? Pain Location Wrist   ? Pain Orientation Right   ? Pain Descriptors / Indicators Aching   ? Pain Type Chronic pain   ? Pain Onset More than a month ago   ? Pain Frequency Intermittent   ? Aggravating Factors  overuse   ? Pain Relieving Factors heat   ? ?  ?  ? ?  ? ? ? ? ? ? ? ? ? ? ? ? ? ? ?Treatment: Fluidotherapy to RUE x 9 mins for pain relief, no adverse reactions. ?Forearm self massage with massage ball ? ? ? ? ? ? ? ? OT Treatment/ Education - 06/30/21 1246   ? ? Education Details reveiwed updated HEP, reveiwed splint wear during proviking activities, discussed progress towards goals.   ? Person(s) Educated Patient   ? Methods Explanation;Demonstration   ? Comprehension Verbalized understanding;Returned demonstration;Verbal cues required   ? ?  ?  ? ?  ? ? ? OT Short Term Goals - 06/21/21 1704   ? ?  ? OT SHORT TERM GOAL #1  ? Title I with inital HEP.   ? Baseline dependent   ? Time 4   ?  Period Weeks   ? Status Achieved   ? Target Date 05/26/21   ?  ? OT SHORT TERM GOAL #2  ? Title I with splint wear, care and precautions   ? Baseline Pt has splint but she is not wearing regularly, needs education   ? Time 4   ? Period Weeks   ? Status Achieved   ?  ? OT SHORT TERM GOAL #3  ? Title I with positioning/  activity modification to minimize pain   ? Baseline dependent   ? Time 4   ? Period Weeks   ? Status Achieved   ? ?  ?  ? ?  ? ? ? ? OT Long Term Goals - 06/30/21 1035   ? ?  ? OT LONG TERM GOAL #1  ? Title I with updated HEP   ? Baseline dependent   ? Time 9   ? Period Weeks   ? Status Achieved   ? Target Date 07/07/21   ?  ? OT LONG TERM GOAL #2  ? Title Pt will report pain no greater than 3/10 with RUE functional use.   ? Baseline Pain 6/10 with repetative use   ? Time 9   ? Period Weeks   ? Status Achieved   ?  ? OT LONG TERM GOAL #3  ? Title Pt will report that she has resumed use of RUE as dominant hand for all  activities.   ? Baseline Pt has to use LUE to assist when pain increases   ? Time 9   ? Period Weeks   ? Status Achieved   ?  ? OT LONG TERM GOAL #4  ? Title Pt will report increased ease with styling her dtr's hair and using a spray bottle.   ? Baseline Pt reports difficulty with these tasks, and increased pain   ? Time 9   ? Period Weeks   ? Status Achieved   pt has modified how she is using spray bottle  ? ?  ?  ? ?  ? ? ? ? ? ? ? ? Plan - 06/30/21 1244   ? ? Clinical Impression Statement Pt feels good about her overall progress and requests d/c.   ? OT Occupational Profile and History Problem Focused Assessment - Including review of records relating to presenting problem   ? Occupational performance deficits (Please refer to evaluation for details): ADL's;IADL's;Work;Social Participation;Leisure;Play   ? Body Structure / Function / Physical Skills ADL;UE functional use;Balance;Flexibility;Pain;FMC;ROM;Coordination;GMC;IADL;Decreased knowledge of use of DME;Strength;Dexterity   ? Rehab Potential Good   ? Clinical Decision Making Limited treatment options, no task modification necessary   ? Comorbidities Affecting Occupational Performance: May have comorbidities impacting occupational performance   ? Modification or Assistance to Complete Evaluation  No modification of tasks or assist necessary to complete eval   ? OT Frequency --   2x week x 3 weeks followed by 1x week x 6 weeks or 12 visits  ? OT Treatment/Interventions Self-care/ADL training;Ultrasound;Patient/family education;Balance training;Passive range of motion;Paraffin;Fluidtherapy;Splinting;Electrical Stimulation;Moist Heat;Therapeutic exercise;Manual Therapy;Therapeutic activities;Neuromuscular education;DME and/or AE instruction;Iontophoresis;Cryotherapy;Contrast Bath   ? Plan d/c OT   ? Consulted and Agree with Plan of Care Patient   ? ?  ?  ? ?  ? ? ?Patient will benefit from skilled therapeutic intervention in order to improve the following  deficits and impairments:   ?Body Structure / Function / Physical Skills: ADL, UE functional use, Balance, Flexibility, Pain, FMC, ROM, Coordination, GMC, IADL, Decreased knowledge  of use of DME, Strength, Dexterity ?  ?  ? ? ?Visit Diagnosis: ?Muscle weakness (generalized) ? ?Stiffness of right wrist, not elsewhere classified ? ?Pain in right wrist ? ? ? ?Problem List ?Patient Active Problem List  ? Diagnosis Date Noted  ? Smoking 02/23/2021  ? Patellar tendinitis of both knees 01/27/2021  ? Pain in joint involving right ankle and foot 01/27/2021  ? Thoracic back pain 12/23/2020  ? Right elbow pain 12/23/2020  ? Chronic pain of right knee 12/23/2020  ? Ankle fracture, left 08/09/2020  ? Dysphagia 05/20/2019  ? Encounter for other preprocedural examination 05/20/2019  ? PCOS (polycystic ovarian syndrome) 02/24/2019  ? Neck pain 02/24/2019  ? Morbid obesity with body mass index (BMI) of 45.0 to 49.9 in adult [facility]) 02/24/2019  ? Essential hypertension 02/24/2019  ? Gastroesophageal reflux disease 02/24/2019  ? Irregular menstrual cycle 01/14/2019  ? Abnormal uterine bleeding 01/14/2019  ? Chronic cough 01/14/2019  ? ? ?Kaden Daughdrill, OT ?06/30/2021, 12:49 PM ? ?[place] ?[place] ?[place], [place], [address] ?Phone: [phone]   Fax:  [phone] ? ?Name: Ann Moran ?MRN: [id number] ?Date of Birth: December 21, 1985 ? ?

## 2021-07-04 ENCOUNTER — Ambulatory Visit
Admission: EM | Admit: 2021-07-04 | Discharge: 2021-07-04 | Disposition: A | Discharge status: Home / Self Care | Payer: Medicaid Other | Attending: Emergency Medicine | Admitting: Emergency Medicine

## 2021-07-04 ENCOUNTER — Other Ambulatory Visit: Payer: Self-pay

## 2021-07-04 ENCOUNTER — Ambulatory Visit: Payer: Medicaid Other

## 2021-07-04 DIAGNOSIS — R5383 Other fatigue: Secondary | ICD-10-CM

## 2021-07-04 DIAGNOSIS — B349 Viral infection, unspecified: Secondary | ICD-10-CM | POA: Diagnosis not present

## 2021-07-04 DIAGNOSIS — J029 Acute pharyngitis, unspecified: Secondary | ICD-10-CM

## 2021-07-04 DIAGNOSIS — R52 Pain, unspecified: Secondary | ICD-10-CM | POA: Diagnosis not present

## 2021-07-04 DIAGNOSIS — J4521 Mild intermittent asthma with (acute) exacerbation: Secondary | ICD-10-CM

## 2021-07-04 DIAGNOSIS — J302 Other seasonal allergic rhinitis: Secondary | ICD-10-CM

## 2021-07-04 DIAGNOSIS — R051 Acute cough: Secondary | ICD-10-CM | POA: Diagnosis not present

## 2021-07-04 DIAGNOSIS — J3089 Other allergic rhinitis: Secondary | ICD-10-CM

## 2021-07-04 MED ORDER — IPRATROPIUM BROMIDE 0.06 % NA SOLN: 2.0000 | Freq: Four times a day (QID) | NASAL | 2 refills | Status: DC

## 2021-07-04 MED ORDER — METHYLPREDNISOLONE 4 MG PO TBPK: ORAL_TABLET | ORAL | 0 refills | Status: DC

## 2021-07-04 MED ORDER — CETIRIZINE HCL 10 MG PO TABS: 10.0000 mg | ORAL_TABLET | Freq: Every day | ORAL | 1 refills | Status: DC

## 2021-07-04 MED ORDER — GUAIFENESIN 400 MG PO TABS: ORAL_TABLET | ORAL | 0 refills | Status: DC

## 2021-07-04 MED ORDER — ALBUTEROL SULFATE HFA 108 (90 BASE) MCG/ACT IN AERS: 2.0000 | INHALATION_SPRAY | Freq: Four times a day (QID) | RESPIRATORY_TRACT | 1 refills | Status: DC | PRN

## 2021-07-04 MED ORDER — AEROCHAMBER PLUS FLO-VU LARGE MISC: 1.0000 | Freq: Once | 0 refills | Status: AC

## 2021-07-04 MED ORDER — PROMETHAZINE-DM 6.25-15 MG/5ML PO SYRP: 5.0000 mL | ORAL_SOLUTION | Freq: Four times a day (QID) | ORAL | 0 refills | Status: DC | PRN

## 2021-07-04 MED ORDER — ALBUTEROL SULFATE (2.5 MG/3ML) 0.083% IN NEBU
2.5000 mg | INHALATION_SOLUTION | Freq: Once | RESPIRATORY_TRACT | Status: AC
  Administered 2021-07-04: 2.5 mg via RESPIRATORY_TRACT

## 2021-07-04 MED ORDER — AZITHROMYCIN 250 MG PO TABS
ORAL_TABLET | ORAL | 0 refills | Status: AC
Start: 2021-07-04 — End: 2021-07-09

## 2021-07-04 MED ORDER — FLUTICASONE PROPIONATE 50 MCG/ACT NA SUSP: 2.0000 | Freq: Every day | NASAL | 1 refills | Status: DC

## 2021-07-04 MED ORDER — METHYLPREDNISOLONE SODIUM SUCC 125 MG IJ SOLR
80.0000 mg | Freq: Once | INTRAMUSCULAR | Status: AC
  Administered 2021-07-04: 80 mg via INTRAMUSCULAR

## 2021-07-04 NOTE — Discharge Instructions (Addendum)
Your symptoms and physical exam findings are concerning for a viral respiratory infection.  You were tested for both COVID and influenza today, the result of your viral testing will be posted to your MyChart once it is complete, this typically takes 24 to 48 hours.  If there is a positive result, you will be contacted by phone with further recommendations, if any.  ?  ?Your symptoms and my physical exam findings are concerning for reactive airway disease with an acute exacerbation.  Reactive airways disease is when your lower airway becomes inflamed due to viral infection, bacterial infection or allergy exposure.  Your symptoms and my physical exam findings are also concerning for exacerbation of your underlying allergies.  It is important that you are consistent with taking allergy medications exactly as prescribed.   ? ?Please keep the following in mind:  ? ?Not taking your allergy medications on a regular basis increases your risk of more frequent upper respiratory infections that may or may not require the use of antibiotics, serious exacerbations that require the use of oral steroids, loss of time at work and missed social opportunities. ?  ?Antibiotics can increase you risk of diarrhea, C. difficile infection, allergic reactions such as anaphylaxis, Stevens-Johnson syndrome, drug related rashes, yeast infections, systemic lupus.  Taking hormonal birth control pills, antibiotics can interfere with her ability to prevent pregnancy. ?  ?Oral steroids can cause muscle weakness, swelling in the extremities, mood swings, upset stomach with nausea and vomiting, difficulty sleeping, headaches, fatigue and fragile skin.  Oral steroids can also increase your risk of bacterial infection. ?  ? ? ?Please see the list below for recommended medications, dosages and frequencies to provide relief of your current symptoms:   ? ?ProAir, Ventolin, Proventil (albuterol): This inhaled medication contains a short acting beta agonist  bronchodilator.  This medication works on the smooth muscle that opens and constricts of your airways by relaxing the muscle.  The result of relaxation of the smooth muscle is increased air movement and improved work of breathing.  This is a short acting medication that can be used every 4-6 hours as needed for increased work of breathing, shortness of breath, wheezing and excessive coughing.  I have provided you with a prescription for Albuterol HFA to be used with the spacer that I have also prescribed. ? ?Methylprednisolone IM (Solu-Medrol):  To quickly address your significant respiratory inflammation, you were provided with an injection of methylprednisolone in the office today.  You should continue to feel the full benefit of the steroid for the next 4 to 6 hours.  ?  ?Methylprednisolone (Medrol Dosepak): This is a steroid that will significantly calm your upper and lower airways, please take one row of tablets daily with your breakfast meal starting tomorrow morning until the prescription is complete.    ? ?Azithromycin (Z-Pak): This is an antibiotic that will help prevent bacterial infection in your lungs caused by taking steroids.  Please take 2 tablets the first day, then take 1 tablet daily every day thereafter until complete. ?  ?Zyrtec (cetirizine): This is an excellent second-generation antihistamine that helps to reduce respiratory inflammatory response to environmental allergens.  In some patients, this medication can cause daytime sleepiness so I recommend that you take 1 tablet daily at bedtime.   ?  ?Flonase (fluticasone): This is a steroid nasal spray that you use once daily, 1 spray in each nare.  This medication does not work well if you decide to use it only used as  you feel you need to, it works best used on a daily basis.  After 3 to 5 days of use, you will notice significant reduction of the inflammation and mucus production that is currently being caused by exposure to allergens, whether  seasonal or environmental.  The most common side effect of this medication is nosebleeds.  If you experience a nosebleed, please discontinue use for 1 week, then feel free to resume.  I have provided you with a prescription but you can also purchase this medication over-the-counter if your insurance will not cover it. ?  ?Ipratropium (Atrovent): This is an excellent, fast acting nasal decongestant spray that does not cause rebound congestion, please instill 2 sprays into each nare with each use.  Because nasal steroids can take several days before they begin to provide full benefit, I recommend that you use this spray in addition to the nasal steroid prescribed for you.  Please use it after you have used your nasal steroid and repeat up to 4 times daily as needed.  I have provided you with a prescription for this medication.    ?  ?Guaifenesin (Robitussin, Mucinex): This is an expectorant.  This helps break up chest congestion and loosen up thick nasal drainage making phlegm and drainage more liquid and therefore easier to remove.  I recommend being 400 mg three times daily as needed.    ?  ?Promethazine DM: Promethazine is both a nasal decongestant and an antinausea medication that makes most patients feel fairly sleepy.  The DM is dextromethorphan, a cough suppressant found in many over-the-counter cough medications.  Please take 5 mL before bedtime to minimize your cough which will help you sleep better.  I have provided you with a prescription for this medication.    ? ?Conservative care is also recommended at this time.  This includes rest, pushing clear fluids and activity as tolerated.  Warm beverages such as teas and broths versus cold beverages/popsicles and frozen sherbet/sorbet are your choice, both warm and cold are beneficial.  You may also notice that your appetite is reduced; this is okay as long as you are drinking plenty of clear fluids.  ? ?Please continue taking Zyrtec, Flonase and Atrovent as  needed at least through June of this year to prevent further episodes such as this one.  Consider these medications "preventive" instead of "interventive".  We love seeing you but we know that if you have the choice, you would rather not be sick. ?  ?Please follow-up within the next 3 to 5 days either with your primary care provider or with [place] here at urgent care if your symptoms do not resolve.  If you do not have a primary care provider, we will assist you in finding one. ?  ?Thank you for visiting urgent care today.  We appreciate the opportunity to participate in your care and hope you feel much better very soon! ? ?

## 2021-07-04 NOTE — ED Triage Notes (Signed)
Pt presents with complaints of cough, nasal congestion, sneezing, and sore throat when swallowing and talking. Reports temperature of 99.1. symptoms started on Friday. ?

## 2021-07-04 NOTE — ED Provider Notes (Signed)
?[facility] ? ? ? ?CSN: [id number] ?Arrival date & time: 07/04/21  0815 ?  ? ?HISTORY  ? ?Chief Complaint  ?Patient presents with  ? Nasal Congestion  ? ?HPI ?Ann Moran is a 36 y.o. female. Pt presents with complaints of cough, nasal congestion, sneezing, and sore throat when swallowing and talking. Reports tmax of 99.1, symptoms started or days ago.  Patient states cough is not productive.  Patient reports a history of allergies, currently not taking any allergy medications, denies history of asthma.  Per EMR, patient has been prescribed albuterol, Flonase, cetirizine in the past.  Patient also has been prescribed cough suppressants in the past as well for other episodes of prolonged cough.  Patient reports being a current every day smoker.  States that she took a few capsules of amoxicillin which she had leftover from a streptococcal pharyngitis infection in October of last year because she did not finish the full 10-day course as well as some Mucinex, patient states both of these helped "a little bit". ? ?The history is provided by the patient.  ?Past Medical History:  ?Diagnosis Date  ? Allergy   ? Anemia   ? Anxiety   ? Chlamydia   ? Depression   ? hx of meds, none currently- doing ok  ? Diabetes mellitus without complication (HCC)   ? GERD (gastroesophageal reflux disease)   ? Infection due to trichomonas   ? MRSA (methicillin resistant staph aureus) culture positive   ? Dec 2012  ? Obesity   ? Pregnancy induced hypertension   ? Urinary tract infection   ? ?Patient Active Problem List  ? Diagnosis Date Noted  ? Smoking 02/23/2021  ? Patellar tendinitis of both knees 01/27/2021  ? Pain in joint involving right ankle and foot 01/27/2021  ? Thoracic back pain 12/23/2020  ? Right elbow pain 12/23/2020  ? Chronic pain of right knee 12/23/2020  ? Ankle fracture, left 08/09/2020  ? Dysphagia 05/20/2019  ? Encounter for other preprocedural examination 05/20/2019  ? PCOS (polycystic ovarian syndrome)  02/24/2019  ? Neck pain 02/24/2019  ? Morbid obesity with body mass index (BMI) of 45.0 to 49.9 in adult [facility](HCC) 02/24/2019  ? Essential hypertension 02/24/2019  ? Gastroesophageal reflux disease 02/24/2019  ? Irregular menstrual cycle 01/14/2019  ? Abnormal uterine bleeding 01/14/2019  ? Chronic cough 01/14/2019  ? ?Past Surgical History:  ?Procedure Laterality Date  ? CHOLECYSTECTOMY    ? ECTOPIC PREGNANCY SURGERY    ? ?OB History   ? ? Gravida  ?2  ? Para  ?1  ? Term  ?0  ? Preterm  ?1  ? AB  ?1  ? Living  ?1  ?  ? ? SAB  ?   ? IAB  ?   ? Ectopic  ?1  ? Multiple  ?   ? Live Births  ?1  ?   ?  ?  ? ?Home Medications   ? ?Prior to Admission medications   ?Medication Sig Start Date End Date Taking? Authorizing Provider  ?amLODipine (NORVASC) 10 MG tablet Take 1 tablet (10 mg total) by mouth daily. 02/23/21   Anders SimmondsMcClung, Angela M, PA-C  ?benzonatate (TESSALON) 100 MG capsule Take 1 capsule (100 mg total) by mouth 3 (three) times daily as needed for cough. 03/30/21   Hoy RegisterNewlin, Enobong, MD  ?hydrOXYzine (ATARAX/VISTARIL) 25 MG tablet Take 1 tablet (25 mg total) by mouth every 8 (eight) hours as needed for anxiety. 10/18/20   Hoy RegisterNewlin, Enobong,  MD  ?losartan (COZAAR) 100 MG tablet Take 1 tablet (100 mg total) by mouth daily. 02/23/21   Anders Simmonds, PA-C  ?meloxicam (MOBIC) 15 MG tablet Take 1 tablet (15 mg total) by mouth daily. 03/27/21   Lenda Kelp, MD  ?mupirocin ointment (BACTROBAN) 2 % Apply 1 application topically 2 (two) times daily. Prn boils 02/23/21   Anders Simmonds, PA-C  ?citalopram (CELEXA) 20 MG tablet Take 1 tablet (20 mg total) by mouth daily. ?Patient not taking: Reported on 03/14/2020 10/19/19 03/23/20  Hoy Register, MD  ?medroxyPROGESTERone (PROVERA) 5 MG tablet Take 2 tablets (10 mg total) by mouth daily. ?Patient not taking: Reported on 07/20/2018 06/30/18 12/06/18  Geoffery Lyons, MD  ? ?Family History ?Family History  ?Problem Relation Age of Onset  ? Hypertension Mother   ? Diabetes Mother   ? Asthma  Father   ? Hypertension Father   ? Stroke Maternal Grandmother   ? Diabetes Paternal Grandmother   ? Hypertension Paternal Grandmother   ? Colon cancer Paternal Grandmother   ? Colon cancer Paternal Aunt   ? Esophageal cancer Neg Hx   ? Stomach cancer Neg Hx   ? Rectal cancer Neg Hx   ? ?Social History ?Social History  ? ?Tobacco Use  ? Smoking status: Every Day  ?  Packs/day: 0.50  ?  Years: 9.00  ?  Pack years: 4.50  ?  Types: Cigarettes  ? Smokeless tobacco: Never  ?Vaping Use  ? Vaping Use: Some days  ? Last attempt to quit: 08/18/2015  ? Devices: "rarely"  ?Substance Use Topics  ? Alcohol use: Yes  ? Drug use: No  ? ?Allergies   ?Patient has no known allergies. ? ?Review of Systems ?Review of Systems ?Pertinent findings noted in history of present illness.  ? ?Physical Exam ?Triage Vital Signs ?ED Triage Vitals  ?Enc Vitals Group  ?   BP 02/17/21 0827 (!) 147/82  ?   Pulse Rate 02/17/21 0827 72  ?   Resp 02/17/21 0827 18  ?   Temp 02/17/21 0827 98.3 ?F (36.8 ?C)  ?   Temp Source 02/17/21 0827 Oral  ?   SpO2 02/17/21 0827 98 %  ?   Weight --   ?   Height --   ?   Head Circumference --   ?   Peak Flow --   ?   Pain Score 02/17/21 0826 5  ?   Pain Loc --   ?   Pain Edu? --   ?   Excl. in GC? --   ?No data found. ? ?Updated Vital Signs ?BP (!) 140/99   Pulse (!) 107   Temp 98.5 ?F (36.9 ?C)   Resp 19   LMP 06/30/2021   SpO2 96%  ? ?Physical Exam ?Vitals and nursing note reviewed.  ?Constitutional:   ?   General: She is not in acute distress. ?   Appearance: Normal appearance. She is not ill-appearing.  ?HENT:  ?   Head: Normocephalic and atraumatic.  ?   Salivary Glands: Right salivary gland is not diffusely enlarged or tender. Left salivary gland is not diffusely enlarged or tender.  ?   Right Ear: Ear canal and external ear normal. No drainage. A middle ear effusion is present. There is no impacted cerumen. Tympanic membrane is bulging. Tympanic membrane is not injected or erythematous.  ?   Left Ear: Ear  canal and external ear normal. No drainage. A middle ear effusion is  present. There is no impacted cerumen. Tympanic membrane is bulging. Tympanic membrane is not injected or erythematous.  ?   Ears:  ?   Comments: Bilateral EACs normal, both TMs bulging with clear fluid ?   Nose: Rhinorrhea present. No nasal deformity, septal deviation, signs of injury, nasal tenderness, mucosal edema or congestion. Rhinorrhea is clear.  ?   Right Nostril: Occlusion present. No foreign body, epistaxis or septal hematoma.  ?   Left Nostril: Occlusion present. No foreign body, epistaxis or septal hematoma.  ?   Right Turbinates: Enlarged, swollen and pale.  ?   Left Turbinates: Enlarged, swollen and pale.  ?   Right Sinus: No maxillary sinus tenderness or frontal sinus tenderness.  ?   Left Sinus: No maxillary sinus tenderness or frontal sinus tenderness.  ?   Mouth/Throat:  ?   Lips: Pink. No lesions.  ?   Mouth: Mucous membranes are moist. No oral lesions.  ?   Pharynx: Oropharynx is clear. Uvula midline. No posterior oropharyngeal erythema or uvula swelling.  ?   Tonsils: No tonsillar exudate. 0 on the right. 0 on the left.  ?   Comments: Postnasal drip ?Eyes:  ?   General: Lids are normal.     ?   Right eye: No discharge.     ?   Left eye: No discharge.  ?   Extraocular Movements: Extraocular movements intact.  ?   Conjunctiva/sclera: Conjunctivae normal.  ?   Right eye: Right conjunctiva is not injected.  ?   Left eye: Left conjunctiva is not injected.  ?Neck:  ?   Trachea: Trachea and phonation normal.  ?Cardiovascular:  ?   Rate and Rhythm: Normal rate and regular rhythm.  ?   Pulses: Normal pulses.  ?   Heart sounds: Normal heart sounds. No murmur heard. ?  No friction rub. No gallop.  ?Pulmonary:  ?   Effort: Pulmonary effort is normal. Prolonged expiration present. No tachypnea, bradypnea, accessory muscle usage or respiratory distress.  ?   Breath sounds: No stridor, decreased air movement or transmitted upper airway  sounds. Examination of the right-upper field reveals decreased breath sounds. Examination of the left-upper field reveals decreased breath sounds. Examination of the right-middle field reveals decreased breath so

## 2021-07-07 LAB — COVID-19, FLU A+B NAA
Influenza A, NAA: NOT DETECTED
Influenza B, NAA: NOT DETECTED
SARS-CoV-2, NAA: NOT DETECTED

## 2021-07-17 ENCOUNTER — Encounter: Payer: Self-pay | Admitting: Family Medicine

## 2021-07-17 ENCOUNTER — Ambulatory Visit (INDEPENDENT_AMBULATORY_CARE_PROVIDER_SITE_OTHER): Payer: Medicaid Other | Admitting: Family Medicine

## 2021-07-17 VITALS — BP 138/90 | Ht 69.0 in | Wt 300.0 lb

## 2021-07-17 DIAGNOSIS — S161XXA Strain of muscle, fascia and tendon at neck level, initial encounter: Secondary | ICD-10-CM

## 2021-07-17 DIAGNOSIS — S56911D Strain of unspecified muscles, fascia and tendons at forearm level, right arm, subsequent encounter: Secondary | ICD-10-CM

## 2021-07-17 MED ORDER — DICLOFENAC SODIUM 75 MG PO TBEC: 75.0000 mg | DELAYED_RELEASE_TABLET | Freq: Two times a day (BID) | ORAL | 1 refills | Status: DC

## 2021-07-17 NOTE — Patient Instructions (Signed)
Your exam overall is reassuring. ?You have a forearm strain and cervical strain of your neck. ?Start physical therapy and do home exercises on days you don't go to therapy. ?Take diclofenac 75mg  twice a day with food if needed for pain and inflammation - do not take aleve, ibuprofen, or meloxicam while on this. ?It's ok to take tylenol with this though. ?Heat or ice 15 minutes at a time as needed. ?We will consider imaging of your neck if you're not improving as expected. ?Follow up with me in 5-6 weeks. ?
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
## 2021-08-04 ENCOUNTER — Ambulatory Visit: Payer: Medicaid Other | Attending: Family Medicine
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
## 2021-08-23 ENCOUNTER — Telehealth: Payer: Self-pay | Admitting: Pharmacist

## 2021-08-23 NOTE — Telephone Encounter (Signed)
Attempted to call patient in response to MyChart conversation regarding hypertension. Left voicemail for patient to return my call at her convenience . ? ?[name], PharmD, BCACP ?[facility] ?[phone] ? ?

## 2021-08-24 ENCOUNTER — Ambulatory Visit: Payer: Medicaid Other | Admitting: Physician Assistant

## 2021-08-28 ENCOUNTER — Ambulatory Visit (INDEPENDENT_AMBULATORY_CARE_PROVIDER_SITE_OTHER): Payer: Medicaid Other | Admitting: Family Medicine

## 2021-08-28 ENCOUNTER — Encounter: Payer: Self-pay | Admitting: Family Medicine

## 2021-08-28 ENCOUNTER — Other Ambulatory Visit: Payer: Self-pay | Admitting: Pharmacist

## 2021-08-28 VITALS — BP 143/99 | Ht 69.0 in | Wt 300.0 lb

## 2021-08-28 DIAGNOSIS — M25521 Pain in right elbow: Secondary | ICD-10-CM | POA: Diagnosis present

## 2021-08-28 NOTE — Addendum Note (Signed)
Addended by: Kaylyn Layer T on: 08/28/2021 04:16 PM ? ? Modules accepted: Orders ? ?

## 2021-08-28 NOTE — Chronic Care Management (AMB) (Signed)
? ?   ? ?Chief Complaint  ?Patient presents with  ? Hypertension  ? ? ?Ann Moran is a 36 y.o. year old female who was referred for medication management by their primary care provider, [name], MD. They presented for a telephone visit. ?  ?They were referred to the pharmacist by a quality report for assistance in managing hypertension.  ? ?Subjective: ? ?Care Team: ?Primary Care Provider: [name], MD ; Next Scheduled Visit: PA on 09/14/21 for sleep/depression/anxiety ? ?Medication Access/Adherence ? ?Current Pharmacy:  ?[facility], [address] ?[address] ?[place] [address] ?Phone: [phone] Fax: [phone] ? ?[place], [place] [address] ?[address] ?[place] [address] ?Phone: [phone] Fax: [phone] ? ? ?Patient reports affordability concerns with their medications: No  ?Patient reports access/transportation concerns to their pharmacy: Yes - needs refills ?Patient reports adherence concerns with their medications:  Yes  out of several medications ? ? ?Hypertension: ? ?Current medications: prescribed losartan 100 mg daily and amlodipine 10 mg daily, but has run out of refills ? ?Patient does not have a validated, automated, upper arm home BP cuff ? ?Patient reports hypertensive symptoms including headache ? ?Depression/Anxiety with insomnia: ? ?Current medications: non - ran out of citalopram several years ago. Ran out of hydroxyzine. Has been taking 3+ tylenol PM to "quiet her brain" and try to sleep. Reports lots of change going on with her family. Has an appointment with PA at the practice to address.  ? ?Is interested in therapy resources. Denies SI/HI.  ? ? ?Health Maintenance ? ?Health Maintenance Due  ?Topic Date Due  ? COVID-19 Vaccine (2 - Pfizer series) 10/22/2019  ?  ? ?Objective: ?Lab Results  ?Component Value Date  ? HGBA1C  5.7 (H) 02/23/2021  ? ? ?Lab Results  ?Component Value Date  ? CREATININE 0.77 02/23/2021  ? BUN 6 02/23/2021  ? NA 142 02/23/2021  ? K 4.2 02/23/2021  ? CL 102 02/23/2021  ? CO2 26 02/23/2021  ? ? ?No results found for: CHOL, HDL, LDLCALC, LDLDIRECT, TRIG, CHOLHDL ? ?Medications Reviewed Today   ? ? Reviewed by Osker Mason, RPH-CPP (Pharmacist) on 08/28/21 at 1608  Med List Status: <None>  ? ?Medication Order Taking? Sig Documenting Provider Last Dose Status Informant  ?albuterol (VENTOLIN HFA) 108 (90 Base) MCG/ACT inhaler [id number] No Inhale 2 puffs into the lungs every 6 (six) hours as needed for wheezing or shortness of breath (Cough).  ?Patient not taking: Reported on 08/28/2021  ? [name], PA-C Not Taking Active   ?amLODipine (NORVASC) 10 MG tablet [id number] No Take 1 tablet (10 mg total) by mouth daily.  ?Patient not taking: Reported on 08/28/2021  ? [name], PA-C Not Taking Active   ?cetirizine (ZYRTEC ALLERGY) 10 MG tablet [id number] Yes Take 1 tablet (10 mg total) by mouth at bedtime. [name], PA-C Taking Active   ? Patient not taking:   Discontinued 03/23/20 1204   ?diclofenac (VOLTAREN) 75 MG EC tablet [id number]  Take 1 tablet (75 mg total) by mouth 2 (two) times daily. Dene Gentry, MD  Active   ?fluticasone (FLONASE) 50 MCG/ACT nasal spray [id number] Yes Place 2 sprays into both nostrils daily. [name], PA-C Taking Active   ?guaifenesin (HUMIBID E) 400 MG TABS tablet [id number]  Take 1 tablet 3 times daily as needed for chest congestion  and cough [name], PA-C  Active   ?hydrOXYzine (ATARAX/VISTARIL) 25 MG tablet [id number] No Take 1 tablet (25 mg total) by mouth every 8 (eight) hours as needed for anxiety.  ?Patient not taking: Reported on 08/28/2021  ? [name], MD Not Taking Active   ?ipratropium (ATROVENT) 0.06 % nasal spray [id number] Yes Place 2 sprays into both nostrils 4 (four) times daily. As needed for nasal  congestion, runny nose [name], PA-C Taking Active   ?losartan (COZAAR) 100 MG tablet [id number] No Take 1 tablet (100 mg total) by mouth daily.  ?Patient not taking: Reported on 08/28/2021  ? [name], PA-C Not Taking Active   ? Patient not taking:   Discontinued 12/06/18 0950 mupirocin ointment (BACTROBAN) 2 % 0000000  Apply 1 application topically 2 (two) times daily. Prn boils [name], [place]  Active   ? ?  ?  ? ?  ? ? ?Assessment/Plan:  ? ? ?Hypertension: ?- Currently uncontrolled ?- Will collaborate with embedded pharmacist to place refills for amlodipine and losartan.  ?- Will collaborate with embedded pharmacist to place order for BP machine to [facility]. Patient will pick up. ? ? ? ?Depression/Anxiety: ?- Currently uncontrolled ?- Scheduled with managed medicaid LCSW in next available new patient slot next week.  ? ? ? ?Follow Up Plan: phone call in 2 weeks ? ?Catie Hedwig Morton, PharmD, BCACP ?[facility] ?[phone] ? ? ?

## 2021-08-28 NOTE — Patient Instructions (Signed)
We will go ahead with an MRI of your right elbow. ?I will call you with the results and next steps after this. ?

## 2021-08-28 NOTE — Telephone Encounter (Signed)
Attempted to call patient again. Left voicemail for her to return my call. My MyChart message from last week has been read.  ? ?Catie Hedwig Morton, PharmD, BCACP ?[facility] ?[phone] ? ?

## 2021-08-28 NOTE — Patient Instructions (Signed)
Hi Ann Moran,  ? ?It was great talking with you! ? ?We'll send the refills on your blood pressure medications to [facility] and the prescription for the blood pressure machine to [facility].  ? ?Check your blood pressure twice weekly, and any time you have concerning symptoms like headache, chest pain, dizziness, shortness of breath, or vision changes.  ? ?Our goal is less than 130/80. ? ?To appropriately check your blood pressure, make sure you do the following:  ?1) Avoid caffeine, exercise, or tobacco products for 30 minutes before checking. Empty your bladder. ?2) Sit with your back supported in a flat-backed chair. Rest your arm on something flat (arm of the chair, table, etc). ?3) Sit still with your feet flat on the floor, resting, for at least 5 minutes.  ?4) Check your blood pressure. Take 1-2 readings.  ?5) Write down these readings and bring with you to any provider appointments. ? ?Bring your home blood pressure machine with you to a provider's office for accuracy comparison at least once a year.  ? ?Make sure you take your blood pressure medications before you come to any office visit, even if you were asked to fast for labs. ? ?Ann Moran (our Technical sales engineer) will call you next week.  ? ?Let us know if you need anything in the meantime.  ? ?Ann Moran, PharmD, BCACP ?[facility] ?[phone] ? ?
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
## 2021-08-29 ENCOUNTER — Other Ambulatory Visit: Payer: Self-pay | Admitting: Pharmacist

## 2021-08-29 DIAGNOSIS — F419 Anxiety disorder, unspecified: Secondary | ICD-10-CM

## 2021-08-29 DIAGNOSIS — I10 Essential (primary) hypertension: Secondary | ICD-10-CM

## 2021-08-29 MED ORDER — AMLODIPINE BESYLATE 10 MG PO TABS: 10.0000 mg | ORAL_TABLET | Freq: Every day | ORAL | 0 refills | Status: DC

## 2021-08-29 MED ORDER — BLOOD PRESSURE CUFF MISC: 0 refills | Status: AC

## 2021-08-29 MED ORDER — LOSARTAN POTASSIUM 100 MG PO TABS: 100.0000 mg | ORAL_TABLET | Freq: Every day | ORAL | 0 refills | Status: DC

## 2021-08-29 MED ORDER — HYDROXYZINE HCL 25 MG PO TABS: 25.0000 mg | ORAL_TABLET | Freq: Three times a day (TID) | ORAL | 0 refills | Status: DC | PRN

## 2021-08-31 ENCOUNTER — Other Ambulatory Visit: Payer: Self-pay | Admitting: Pharmacist
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
## 2021-09-05 ENCOUNTER — Encounter: Payer: Self-pay | Admitting: Physician Assistant

## 2021-09-05 ENCOUNTER — Other Ambulatory Visit: Payer: Self-pay | Admitting: Licensed Clinical Social Worker

## 2021-09-05 ENCOUNTER — Telehealth: Payer: Medicaid Other | Admitting: Physician Assistant

## 2021-09-05 DIAGNOSIS — F430 Acute stress reaction: Secondary | ICD-10-CM | POA: Diagnosis not present

## 2021-09-05 DIAGNOSIS — F5102 Adjustment insomnia: Secondary | ICD-10-CM

## 2021-09-05 DIAGNOSIS — Z8659 Personal history of other mental and behavioral disorders: Secondary | ICD-10-CM

## 2021-09-05 DIAGNOSIS — F411 Generalized anxiety disorder: Secondary | ICD-10-CM | POA: Diagnosis not present

## 2021-09-05 DIAGNOSIS — F32A Depression, unspecified: Secondary | ICD-10-CM

## 2021-09-05 MED ORDER — BUSPIRONE HCL 7.5 MG PO TABS: 7.5000 mg | ORAL_TABLET | Freq: Two times a day (BID) | ORAL | 0 refills | Status: DC
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
## 2021-09-05 NOTE — Patient Instructions (Addendum)
?Visit Information ? ?Ms. Ferrufino was given information about Medicaid Managed Care team care coordination services as a part of their [place] Medicaid benefit. [name] verbally consentedto engagement with the [facility] Managed Care team.  ? ?If you are experiencing a medical emergency, please call 911 or report to your local emergency department or urgent care.  ? ?If you have a non-emergency medical problem during routine business hours, please contact your provider's office and ask to speak with a nurse.  ? ?For questions related to your [facility] health plan, please call: [phone]  OR visit the member homepage at: [email or web address] ? ?If you would like to schedule transportation through your [place] Medicaid plan, please call the following number at least 2 days in advance of your appointment: [phone] ? ?If you are experiencing a behavioral health crisis, call the [place] at [phone]). The line is available 24 hours a day, seven days a week. ? ?If you would like help to quit smoking, call 1-800-QUIT-NOW 306-090-3306) OR Espa?ol: 1-855-D?jelo-Ya 734-215-6273) o para m?s informaci?n haga clic aqu? or Text READY to 200-400 to register via text ? ?Following is a copy of your plan of care:  ?Care Plan : [facility]  ?Updates made by Greg Cutter, LCSW since 09/05/2021 12:00 AM  ?  ? ?Problem: Coping Skills (General Plan of Care)   ?  ? ?Long-Range Goal: I need mental health support   ?Start Date: 09/05/2021  ?Priority: High  ?Note:   ?Priority: High ? ?Timeframe:  Long-Range Goal ?Priority:  High ?Start Date:   09/05/21            ?Expected End Date:  ongoing                   ?  ?Follow Up Date--09/22/21 at 1:00 pm. ? ?- check out counseling ?- keep 90 percent of counseling appointments ?- schedule counseling appointment  ?   ?Why is this important?   ?          Beating depression may take some time.  ?          If you don't feel better right away, don't give up on your treatment plan.  ? ? Current barriers:   ?Chronic Mental Health needs related to depression, insomnia, stress and anxiety. Patient requires Support, Education, Resources, Referrals, Advocacy, and Care Coordination, in order to meet Unmet Mental Health Needs. ?Patient will implement clinical interventions discussed today to decrease symptoms of depression and increase knowledge and/or ability of: coping skills. ?Mental Health Concerns and Social Isolation ?Patient lacks knowledge of available community counseling agencies and resources. ? ?Clinical Goal(s): verbalize understanding of plan for management of Anxiety, Depression, Insomnia and Stress and demonstrate a reduction in symptoms. Patient will connect with a provider for ongoing mental health treatment, increase coping skills, healthy habits, self-management skills, and stress reduction     ?   ?Patient Goals/Self-Care Activities: Over the next 120 days ?Attend scheduled medical appointments ?Utilize healthy coping skills and supportive resources discussed ?Contact PCP with any questions or concerns ?Keep 90 percent of counseling appointments ?Call your insurance provider for more information about your Enhanced Benefits  ?Check out counseling resources provided  ?Begin personal counseling with LCSW, to reduce and manage symptoms of Depression and Stress, until well-established with mental health provider ?Accept all calls from representative with [facility] in an effort to establish ongoing mental health counseling  and supportive services. ?Incorporate into daily practice - relaxation techniques, deep breathing exercises, and mindfulness meditation strategies. ?Talk about feelings with friends, family members, spiritual advisor, etc. ?Contact LCSW directly [phone]), if you have questions, need assistance, or if  additional social work needs are identified between now and our next scheduled telephone outreach call. ?Call [phone] for mental health hotline/crisis line if needed (24/7 available) ?Try techniques to reduce symptoms of anxiety/negative thinking (deep breathing, distraction, positive self talk, etc)  ?- develop a personal safety plan ?- develop a plan to deal with triggers like holidays, anniversaries ?- exercise at least 2 to 3 times per week ?- have a plan for how to handle bad days ?- journal feelings and what helps to feel better or worse ?- spend time or talk with others at least 2 to 3 times per week ?- watch for early signs of feeling worse ?- begin personal counseling ?- call and visit an old friend ?- check out volunteer opportunities ?- join a support group ?- laugh; watch a funny movie or comedian ?- learn and use visualization or guided imagery ?- perform a random act of kindness ?- practice relaxation or meditation daily ?- start or continue a personal journal ?- practice positive thinking and self-talk ?-continue with compliance of taking medication  ?-identify current effective and ineffective coping strategies.  ?-implement positive self-talk in care to increase self-esteem, confidence and feelings of control.  ?-consider alternative and complementary therapy approaches such as meditation, mindfulness or yoga.  ?-journaling, prayer, worship services, meditation or pastoral counseling.  ?-increase participation in pleasurable group activities such as hobbies, singing, sports or volunteering).  ?-consider the use of meditative movement therapy such as tai chi, yoga or qigong.  ?-start a regular daily exercise program based on tolerance, ability and patient choice to support positive thinking and activity    ? ? ?  ?24- Hour Availability:  ?  ?[facility]  ?[place], [place] ?[place] [phone] ?Crisis [phone] ?  ?Family Service of the [employer]  [phone] ?  ?[employer] Crisis Service  [phone]  ?  ?[place]  [phone] (after hours) ?  ?Therapeutic Alternative/Mobile Crisis   [phone] ?  ?[place] National Suicide Hotline  207-475-1711 (TALK) OR 988 ?  ?Call 911 or go to emergency room ?  ?[employer]  810-591-3441);  [place]  ?  ?[facility] ACCESS  ?([phone]); [place], [place], [place], [place], [place], [place], [place] ?  ? ?  ?  ?10 LITTLE Things To Do When You?re Feeling Too Down To Do Anything ? ?Take a shower. ?Even if you plan to stay in all day long and not see a soul, take a shower. It takes the most effort to hop in to the shower but once you do, you?ll feel immediate results. It will wake you up and you?ll be feeling much fresher (and cleaner too). ? ?Brush and floss your teeth. ?Give your teeth a good brushing with a floss finish. It?s a small task but it feels so good and you can check ?taking care of your health? off the list of things to do. ? ?Do something small on your list. ?Most of [place] have some small thing we would like to get done (load of laundry, sew a button, email a friend). Doing one of these things will make you feel like you?ve accomplished something. ? ?Drink water. ?Drinking water is easy right? It?s also really beneficial for your health  so keep a glass beside you all day and take sips often. It gives you energy and prevents you from boredom eating. ? ?Do some floor exercises. ?The last thing you want to do is exercise but it might be just the thing you need the most. Keep it simple and do exercises that involve sitting or laying on the floor. Even the smallest of exercises release chemicals in the brain that make you feel good. Yoga stretches or core exercises are going to make you feel good with minimal effort. ? ?Make your bed. ?Making your bed takes a few minutes but it?s productive and you?ll feel relieved when it?s done. An unmade bed is a huge visual  reminder that you?re having an unproductive day. Do it and consider it your housework for the day. ? ?Put on some nice clothes. ?Take the sweatpants off even if you don?t plan to go anywhere. Put on clothes that m

## 2021-09-05 NOTE — Patient Outreach (Signed)
[facility] Customer service manager [facility]) Care Management ? ?09/05/2021 ? ?Ann Moran ?[birth year] ?[id number] ? ?LCSW completed [facility] outreach attempt today during scheduled appointment time but was unable to reach patient successfully. A HIPPA compliant voice message was left encouraging patient to return call once available. LCSW will ask Scheduling Care Guide to reschedule [facility] appointment with patient as well. ? ?Dickie La, BSW, MSW, LCSW ?Managed Medicaid LCSW ?[place]  [facility] ?[email or web address] ?Phone: [phone] ? ? ? ?

## 2021-09-05 NOTE — Patient Instructions (Signed)
?[name], thank you for joining [name], PA-C for today's virtual visit.  While this provider is not your primary care provider (PCP), if your PCP is located in our provider database this encounter information will be shared with them immediately following your visit. ? ?Consent: ?(Patient) Ann Moran provided verbal consent for this virtual visit at the beginning of the encounter. ? ?Current Medications: ? ?Current Outpatient Medications:  ?  albuterol (VENTOLIN HFA) 108 (90 Base) MCG/ACT inhaler, Inhale 2 puffs into the lungs every 6 (six) hours as needed for wheezing or shortness of breath (Cough). (Patient not taking: Reported on 08/28/2021), Disp: 6 each, Rfl: 1 ?  amLODipine (NORVASC) 10 MG tablet, Take 1 tablet (10 mg total) by mouth daily., Disp: 30 tablet, Rfl: 0 ?  Blood Pressure Monitoring (BLOOD PRESSURE CUFF) MISC, Use to check blood pressure once daily., Disp: 1 each, Rfl: 0 ?  cetirizine (ZYRTEC ALLERGY) 10 MG tablet, Take 1 tablet (10 mg total) by mouth at bedtime., Disp: 90 tablet, Rfl: 1 ?  diclofenac (VOLTAREN) 75 MG EC tablet, Take 1 tablet (75 mg total) by mouth 2 (two) times daily., Disp: 60 tablet, Rfl: 1 ?  fluticasone (FLONASE) 50 MCG/ACT nasal spray, Place 2 sprays into both nostrils daily., Disp: 48 mL, Rfl: 1 ?  guaifenesin (HUMIBID E) 400 MG TABS tablet, Take 1 tablet 3 times daily as needed for chest congestion and cough, Disp: 21 tablet, Rfl: 0 ?  hydrOXYzine (ATARAX) 25 MG tablet, Take 1 tablet (25 mg total) by mouth every 8 (eight) hours as needed for anxiety., Disp: 60 tablet, Rfl: 0 ?  ipratropium (ATROVENT) 0.06 % nasal spray, Place 2 sprays into both nostrils 4 (four) times daily. As needed for nasal congestion, runny nose, Disp: 15 mL, Rfl: 2 ?  losartan (COZAAR) 100 MG tablet, Take 1 tablet (100 mg total) by mouth daily., Disp: 30 tablet, Rfl: 0 ?  mupirocin ointment (BACTROBAN) 2 %, Apply 1 application topically 2 (two) times daily. Prn boils, Disp: 22  g, Rfl: 1  ? ?Medications ordered in this encounter:  ?No orders of the defined types were placed in this encounter. ?  ? ?*If you need refills on other medications prior to your next appointment, please contact your pharmacy* ? ?Follow-Up: ?Call back or seek an in-person evaluation if the symptoms worsen or if the condition fails to improve as anticipated. ? ?Other Instructions ?Please stop daytime use of the Hydroxyzine.  ?You can take 2 of these (50 mg) at bedtime to help with sleep. ?Also start OTC melatonin nightly (5 mg or less).  ?Please read the sleep hygiene practices listed below. ?Start the Buspar, taking as directed for anxiety.  ?Download the Calm or HeadSpace app on your phone to begin mindfulness exercises for anxiety.  ?Call your PCP office to schedule a follow-up in the next week or so to get a reassessment and for further medication adjustments.  ? ?It was very nice talking with you today. Please don't hesitate to reach out to [place] again if your regular provider is unavailable.  ? ?Sleep Hygiene ? ?Do: ?(1) Go to bed at the same time each day. ?(2) Get up from bed at the same time each day. ?(3) Get regular exercise each day, preferably in the morning.  There is goof evidence that regular exercise improves restful sleep.  This includes stretching and aerobic exercise. ?(4) Get regular exposure to outdoor or bright lights, especially in the late afternoon. ?(5) Keep  the temperature in your bedroom comfortable. ?(6) Keep the bedroom quiet when sleeping. ?(7) Keep the bedroom dark enough to facilitate sleep. ?(8) Use your bed only for sleep and sex. ?(9) Take medications as directed.  It is helpful to take prescribed sleeping pills 1 hour before bedtime, so they are causing drowsiness when you lie down, or 10 hours before getting up, to avoid daytime drowsiness. ?(10) Use a relaxation exercise just before going to sleep -- imagery, massage, warm bath. ?(11) Keep your feet and hands warm.  Wear warm  socks and/or mittens or gloves to bed. ? ?Don't: ?(1) Exercise just before going to bed. ?(2) Engage in stimulating activity just before bed, such as playing a competitive game, watching an exciting program on television, or having an important discussion with a loved one. ?(3) Have caffeine in the evening (coffee, teas, chocolate, sodas, etc.) ?(4) Read or watch television in bed. ?(5) Use alcohol to help you sleep. ?(6) Go to bed too hungry or too full. ?(7) Take another person's sleeping pills. ?(8) Take over-the-counter sleeping pills, without your doctor's knowledge.  Tolerance can develop rapidly with these medications.  Diphenhydramine can have serious side effects for elderly patients. ?(9) Take daytime naps. ?(10) Command yourself to go to sleep.  This only makes your mind and body more alert. ? ?If you lie awake for more than 20-30 minutes, get up, go to a different room, participate in a quiet activity (Ex - non-excitable reading or television), and then return to bed when you feel sleepy.  Do this as many times during the night as needed.  This may cause you to have a night or two of poor sleep but it will train your brain to know when it is time for sleep. ? ? ? ? ? ?If you have been instructed to have an in-person evaluation today at a local Urgent Care facility, please use the link below. It will take you to a list of all of our available [facility], including address, phone number and hours of operation. Please do not delay care.  ?[place] [facility] ? ?If you or a family member do not have a primary care provider, use the link below to schedule a visit and establish care. When you choose a [facility] primary care physician or advanced practice provider, you gain a long-term partner in health. ?Find a Primary Care Provider ? ?Learn more about [place]'s in-office and virtual care options: ?[facility] - Get Care Now  ?

## 2021-09-11 ENCOUNTER — Ambulatory Visit: Payer: Self-pay

## 2021-09-11 ENCOUNTER — Ambulatory Visit: Payer: Medicaid Other | Admitting: Internal Medicine

## 2021-09-12 ENCOUNTER — Other Ambulatory Visit: Payer: Self-pay | Admitting: Family Medicine

## 2021-09-14 ENCOUNTER — Encounter: Payer: Self-pay | Admitting: Physician Assistant

## 2021-09-14 ENCOUNTER — Telehealth: Payer: Self-pay | Admitting: Family Medicine

## 2021-09-14 ENCOUNTER — Ambulatory Visit: Payer: Self-pay | Admitting: *Deleted

## 2021-09-14 ENCOUNTER — Telehealth (HOSPITAL_BASED_OUTPATIENT_CLINIC_OR_DEPARTMENT_OTHER): Payer: Medicaid Other | Admitting: Physician Assistant

## 2021-09-14 DIAGNOSIS — F32A Depression, unspecified: Secondary | ICD-10-CM

## 2021-09-14 DIAGNOSIS — G4700 Insomnia, unspecified: Secondary | ICD-10-CM

## 2021-09-14 DIAGNOSIS — F419 Anxiety disorder, unspecified: Secondary | ICD-10-CM | POA: Diagnosis not present

## 2021-09-14 DIAGNOSIS — F4321 Adjustment disorder with depressed mood: Secondary | ICD-10-CM | POA: Diagnosis not present

## 2021-09-14 DIAGNOSIS — K219 Gastro-esophageal reflux disease without esophagitis: Secondary | ICD-10-CM

## 2021-09-14 MED ORDER — SERTRALINE HCL 50 MG PO TABS: 50.0000 mg | ORAL_TABLET | Freq: Every day | ORAL | 2 refills | Status: DC

## 2021-09-14 MED ORDER — TRAZODONE HCL 50 MG PO TABS: 25.0000 mg | ORAL_TABLET | Freq: Every evening | ORAL | 3 refills | Status: DC | PRN

## 2021-09-14 MED ORDER — BUSPIRONE HCL 15 MG PO TABS: 15.0000 mg | ORAL_TABLET | Freq: Two times a day (BID) | ORAL | 2 refills | Status: DC

## 2021-09-14 NOTE — Telephone Encounter (Signed)
Pt requested a refill on heart burn meds.

## 2021-09-14 NOTE — Patient Instructions (Signed)
[facility] [address], [place], [place] [address] [phone] or [phone] Walk-in urgent care 24/7 for anyone  For [facility] ONLY New patient assessments and therapy walk-ins: Monday and Wednesday 8am-11am First and second Friday 1pm-5pm New patient psychiatry and medication management walk-ins: Mondays, Wednesdays, Thursdays, Fridays 8am-11am No psychiatry walk-ins on Tuesday   *Accepts all insurance and uninsured for Urgent Care needs *Accepts Medicaid and uninsured for outpatient treatment   [facility] (Therapy and psychiatry) [facility] (near [place]) [address] [place], [place] [address] [phone] Fax: [phone] ([place])   [place] [facility] [place],  [place]  [address] [phone] Call for appointment  [facility] [place] (Therapy only)  [facility] [address], [place], [place] [address] Monday - Friday: 8:30 a.m.-12 p.m. / 1 p.m.-2:30 p.m.  [facility] [address] [place], [place] [address] Monday-Friday: 8:30 a.m.-12 p.m. / 2-3:30 p.m. (INSURANCE REQUIRED -MEDICAID ACCEPTED) They do offer a sliding fee scale $20-$30/session   [facility] [place], [place] [address] Phone: [place] [address] [place] [address]  Phone: [phone] (Does not accept Medicaid) (only one provider accepts Medicare) [facility] [address] (at [employer]) [place], [place] [address] (Accepts Medicaid and Medicare)  [facility]  [address]  [place], [place] [address]  Phone: [phone]  [address] [place], [place] [address] Phone: [phone] [facility] Medicaid) [facility] (Therapy only)  [address] [place], [place] [address] Phone: [phone]   [facility] (Therapy only)  [place], [place] [address] Phone: [phone] [facility])   [place] [address] [place], [place] [address] Phone: [phone] ([place]) [employer] [phone] [address] [place], [place] [address] Phone: [phone] [facility]) [facility] (Psychiatry only)  [phone] [address], [place], [place] [address] (Accepts Medicaid and Medicare) [facility] (Psychiatry and therapy)  [place], [place], [place] [address] [phone] [facility] Medicare) [place] (psychiatry and therapy) [address], [place], [place] [address] [phone]  [facility] at [address], [place], [place], [place] [address] [phone] Accept [address] [place], [place], [place], [place],  and the following types of Medicaid; [place], [place], Partners, [place], [place], [employer], Healthy [place], [place] Complete, and [place], as well as offering a Manufacturing systems engineer and private payment options. Provides In-Office Appointments, Virtual Appointments, and Phone Consultations Offers medication management for ages 46 years old and up, including,  Medication Management for Suboxone and [place] [phone] [address], [place], [place] [address] ([place] Medicaid and Medicare)         19.  [facility] (therapy only)  [address] [place], [place] [address]            [phone] (Accepts medicare) 20. [facility]  (Suboxone and methodone) [phone] [address], [place], [place] [address] To Be Eligible for Opioid Treatment at [facility] you must be at least 36 years of age you have already tried other  interventions that were not successful such as opioid  detox, inpatient rehab for opioids, or outpatient counseling specifically for opioid dependency your [facility] drug test must be completely free of benzodiazepines (klonopin, xanax, valium, ativan, or other benz) you have reliable transportation to the [facility] clinic in [place] you recognize that counseling is a critical component of [facility]' Opioid Program and you agree to attend all required counseling sessions you are committed to total drug abstinence and will conscientiously strive to remain free of alcohol, marijuana, and other illicit substances while in treatment you desire a peaceful treatment atmosphere in which personal responsibility and respect toward staff and clients is the norm   21. [facility] [address] [place], [place], [place] [address] Offers SAIOP (Substance Abuse Intensive Outpatient Program) [phone] 22. [facility] [address] [place], [place] [address] [phone] (Accepts medicare)  For those who are tech savvy, go on psychology today, type in your local city (i.e. [place]. [place]) and specify your insurance at the top of the screen after you search. (Medicaid if needed). You can also specify whether you are interested in therapy and psychiatry.  [email or web address].com/us     [facility] [address] [place], [place] [address] [phone] or [phone] Walk-in urgent care 24/7 for anyone  For [facility] ONLY New patient assessments and therapy walk-ins: Monday and Wednesday 8am-11am First and second Friday 1pm-5pm New patient psychiatry and medication management walk-ins: Mondays, Wednesdays, Thursdays, Fridays 8am-11am No psychiatry walk-ins on Tuesday

## 2021-09-14 NOTE — Telephone Encounter (Signed)
  Chief Complaint: increasing anxiety- wants to change appointment to virtual Symptoms: not sleeping  or eating, does not want to leave house Frequency: 1 month Pertinent Negatives: Patient denies suicidal intent Disposition: [] ED /[] Urgent Care (no appt availability in office) / [x] Appointment(In office/virtual)/ []  [facility]/ [] Home Care/ [] Refused Recommended Disposition /[] [place] [facility]/ []  Follow-up with PCP Additional Notes:

## 2021-09-14 NOTE — Telephone Encounter (Signed)
Reason for Disposition  Symptoms interfere with sleep  Answer Assessment - Initial Assessment Questions 1. CONCERN: "Did anything happen that prompted you to call today?"      Patient feels she has worsening anxiety 2. ANXIETY SYMPTOMS: "Can you describe how you (your loved one; patient) have been feeling?" (e.g., tense, restless, panicky, anxious, keyed up, overwhelmed, sense of impending doom).      Tense,restless, not able to eat 3. ONSET: "How long have you been feeling this way?" (e.g., hours, days, weeks)     1 month 4. SEVERITY: "How would you rate the level of anxiety?" (e.g., 0 - 10; or mild, moderate, severe).     8- physical symptoms 5. FUNCTIONAL IMPAIRMENT: "How have these feelings affected your ability to do daily activities?" "Have you had more difficulty than usual doing your normal daily activities?" (e.g., getting better, same, worse; self-care, school, work, interactions)     Does not want to leave the house, yes- not eating 6. HISTORY: "Have you felt this way before?" "Have you ever been diagnosed with an anxiety problem in the past?" (e.g., generalized anxiety disorder, panic attacks, PTSD). If Yes, ask: "How was this problem treated?" (e.g., medicines, counseling, etc.)     Yes-medication, counseling 7. RISK OF HARM - SUICIDAL IDEATION: "Do you ever have thoughts of hurting or killing yourself?" If Yes, ask:  "Do you have these feelings now?" "Do you have a plan on how you would do this?"     Thoughts of better off- not feeling like this- but not harming self 8. TREATMENT:  "What has been done so far to treat this anxiety?" (e.g., medicines, relaxation strategies). "What has helped?"     Yes- buspirone 9. TREATMENT - THERAPIST: "Do you have a counselor or therapist? Name?"     Not present 10. POTENTIAL TRIGGERS: "Do you drink caffeinated beverages (e.g., coffee, colas, teas), and how much daily?" "Do you drink alcohol or use any drugs?" "Have you started any new medicines  recently?"     Limited caffeine 10. PATIENT SUPPORT: "Who is with you now?" "Who do you live with?" "Do you have family or friends who you can talk to?"        No support system- local 11. OTHER SYMPTOMS: "Do you have any other symptoms?" (e.g., feeling depressed, trouble concentrating, trouble sleeping, trouble breathing, palpitations or fast heartbeat, chest pain, sweating, nausea, or diarrhea)       Palpitation- chest discomfort, trouble sleeping 12. PREGNANCY: "Is there any chance you are pregnant?" "When was your last menstrual period?"       No- LMP irregular PCOS  Protocols used: Anxiety and Panic Attack-A-AH

## 2021-09-14 NOTE — Progress Notes (Signed)
Patient ID: Ann Moran, female   DOB: Aug 10, 1985, 36 y.o.   MRN: [id number] Virtual Visit via Video Note  I connected with Ann Moran on 09/14/21 at  9:10 AM EDT by a video enabled telemedicine application and verified that I am speaking with the correct person using two identifiers.  Location: Patient: in her car(not driving) Provider: [facility] office   I discussed the limitations of evaluation and management by telemedicine and the availability of in person appointments. The patient expressed understanding and agreed to proceed.  History of Present Illness:  patient having anxiety and depression related to breakup with toxic bf about 1 month ago..  buspar 7.5 bid is not helping.  Hydroxyzine helps some.  Not on an SSRI.  She is also having issues with her daughter.  She is working with medicaid to find a Veterinary surgeon.  She is changing her number this week to make definitive cessation of contact with ex.  Denies SI/HI.  Her sleep is poor and she has tried tylenol pm, benadryl, and melatonin.  She is receptive to SSRI.  Protective factors-family and faith    Observations/Objective:  NAD.  A&Ox3.  TP linear   Assessment and Plan: 1. Grief reaction I recommended GC resources as well as calling churches to find free support groups that are immediately available sertraline (ZOLOFT) 50 MG tablet; Take 1 tablet (50 mg total) by mouth daily.  Dispense: 30 tablet; Refill: 2  2. Anxiety and depression Counseled on self-care - busPIRone (BUSPAR) 15 MG tablet; Take 1 tablet (15 mg total) by mouth 2 (two) times daily.  Dispense: 60 tablet; Refill: 2 - sertraline (ZOLOFT) 50 MG tablet; Take 1 tablet (50 mg total) by mouth daily.  Dispense: 30 tablet; Refill: 2 - traZODone (DESYREL) 50 MG tablet; Take 0.5-1 tablets (25-50 mg total) by mouth at bedtime as needed for sleep.  Dispense: 30 tablet; Refill: 3  3. Insomnia, unspecified type - traZODone (DESYREL) 50 MG tablet; Take 0.5-1 tablets (25-50 mg  total) by mouth at bedtime as needed for sleep.  Dispense: 30 tablet; Refill: 3    Follow Up Instructions: See Dr Alvis Lemmings in 6 weeks.  Appt pending with BH   I discussed the assessment and treatment plan with the patient. The patient was provided an opportunity to ask questions and all were answered. The patient agreed with the plan and demonstrated an understanding of the instructions.   The patient was advised to call back or seek an in-person evaluation if the symptoms worsen or if the condition fails to improve as anticipated.  I provided 17 minutes of non-face-to-face time during this encounter.   [place]ian Co, PA-C

## 2021-09-15 ENCOUNTER — Telehealth: Payer: Medicaid Other | Admitting: Physician Assistant

## 2021-09-15 ENCOUNTER — Telehealth: Payer: Self-pay | Admitting: Internal Medicine

## 2021-09-15 DIAGNOSIS — K219 Gastro-esophageal reflux disease without esophagitis: Secondary | ICD-10-CM

## 2021-09-15 DIAGNOSIS — L03019 Cellulitis of unspecified finger: Secondary | ICD-10-CM

## 2021-09-15 MED ORDER — SULFAMETHOXAZOLE-TRIMETHOPRIM 800-160 MG PO TABS: 1.0000 | ORAL_TABLET | Freq: Two times a day (BID) | ORAL | 0 refills | Status: DC

## 2021-09-15 MED ORDER — OMEPRAZOLE 40 MG PO CPDR: 40.0000 mg | DELAYED_RELEASE_CAPSULE | Freq: Every day | ORAL | 0 refills | Status: DC

## 2021-09-15 NOTE — Progress Notes (Signed)
I have spent 5 minutes in review of e-visit questionnaire, review and updating patient chart, medical decision making and response to patient.   K[place]i Marsico Cody Wyeth Hoffer, PA-C    

## 2021-09-15 NOTE — Progress Notes (Signed)
E Visit for Cellulitis  We are sorry that you are not feeling well. Here is how we plan to help!  Based on what you shared with me it looks like you have cellulitis.  Cellulitis looks like areas of skin redness, swelling, and warmth; it develops as a result of bacteria entering under the skin. Little red spots and/or bleeding can be seen in skin, and tiny surface sacs containing fluid can occur. Fever can be present. Cellulitis is almost always on one side of a body, and the lower limbs are the most common site of involvement.   I have prescribed: Bactrim DS to take twice daily for 7 days. This will cover for staph and MRSA as well.   HOME CARE:  Take your medications as ordered and take all of them, even if the skin irritation appears to be healing.   GET HELP RIGHT AWAY IF:  Symptoms that don't begin to go away within 48 hours. Severe redness persists or worsens If the area turns color, spreads or swells. If it blisters and opens, develops yellow-brown crust or bleeds. You develop a fever or chills. If the pain increases or becomes unbearable.  Are unable to keep fluids and food down.  MAKE SURE YOU   Understand these instructions. Will watch your condition. Will get help right away if you are not doing well or get worse.  Thank you for choosing an e-visit.  Your e-visit answers were reviewed by a board certified advanced clinical practitioner to complete your personal care plan. Depending upon the condition, your plan could have included both over the counter or prescription medications.  Please review your pharmacy choice. Make sure the pharmacy is open so you can pick up prescription now. If there is a problem, you may contact your provider through [employer] and have the prescription routed to another pharmacy.  Your safety is important to [place]. If you have drug allergies check your prescription carefully.   For the next 24 hours you can use MyChart to ask questions about  today's visit, request a non-urgent call back, or ask for a work or school excuse. You will get an email in the next two days asking about your experience. I hope that your e-visit has been valuable and will speed your recovery.

## 2021-09-15 NOTE — Telephone Encounter (Addendum)
Pt stated that she is having increased reflux requesting that a prescription be sent in. Chart reviewed and noted that it  has been over two years since she has been seen: Pt was notified that the Dr. Eulah Citizen have to do an thorough assessment before he would prescribe something: Pt already has office visit scheduled on 10/12/2021 at 9:10 AM:  Pt encouraged to keep appointment:   Pt was sent information to her My Chart ( Reflux food Choices) ( GERD). Pt made aware: Pt verbalized understanding with all questions answered.

## 2021-09-15 NOTE — Telephone Encounter (Signed)
Patient is requesting medication for heart burn I don't currently see any medication on her med list.

## 2021-09-15 NOTE — Progress Notes (Signed)
I have spent 5 minutes in review of e-visit questionnaire, review and updating patient chart, medical decision making and response to patient.   Jenya Putz Cody Karrina Lye, PA-C    

## 2021-09-15 NOTE — Telephone Encounter (Signed)
Patient has an upcoming appointment with Dr. Leone Payor, but she is complaining of heartburn really badly and it's hurting her.  Please call patient and advise.  Thank you.

## 2021-09-15 NOTE — Progress Notes (Signed)
E-Visit for Heartburn  We are sorry that you are not feeling well.  Here is how we plan to help!  Based on what you shared with me it looks like you most likely have Gastroesophageal Reflux Disease (GERD)  Gastroesophageal reflux disease (GERD) happens when acid from your stomach flows up into the esophagus.  When acid comes in contact with the esophagus, the acid causes sorenss (inflammation) in the esophagus.  Over time, GERD may create small holes (ulcers) in the lining of the esophagus.  I have prescribed Omeprazole 20 mg one by mouth daily until you follow up with a provider. -- I see you are scheduled with your GI on 6/20. If anything worsening prior to that time or symptoms are not improving substantially over the weekend, I want you to follow-up your primary care provider. Any significant chest pain with this or significant worsening of symptoms -- ER.   Your symptoms should improve in the next day or two.  You can use antacids as needed until symptoms resolve.  Call us if your heartburn worsens, you have trouble swallowing, weight loss, spitting up blood or recurrent vomiting.  Home Care: May include lifestyle changes such as weight loss, quitting smoking and alcohol consumption Avoid foods and drinks that make your symptoms worse, such as: Caffeine or alcoholic drinks Chocolate Peppermint or mint flavorings Garlic and onions Spicy foods Citrus fruits, such as oranges, lemons, or limes Tomato-based foods such as sauce, chili, salsa and pizza Fried and fatty foods Avoid lying down for 3 hours prior to your bedtime or prior to taking a nap Eat small, frequent meals instead of a large meals Wear loose-fitting clothing.  Do not wear anything tight around your waist that causes pressure on your stomach. Raise the head of your bed 6 to 8 inches with wood blocks to help you sleep.  Extra pillows will not help.  Seek Help Right Away If: You have pain in your arms, neck, jaw, teeth or  back Your pain increases or changes in intensity or duration You develop nausea, vomiting or sweating (diaphoresis) You develop shortness of breath or you faint Your vomit is green, yellow, black or looks like coffee grounds or blood Your stool is red, bloody or black  These symptoms could be signs of other problems, such as heart disease, gastric bleeding or esophageal bleeding.  Make sure you : Understand these instructions. Will watch your condition. Will get help right away if you are not doing well or get worse.  Your e-visit answers were reviewed by a board certified advanced clinical practitioner to complete your personal care plan.  Depending on the condition, your plan could have included both over the counter or prescription medications.  If there is a problem please reply  once you have received a response from your provider.  Your safety is important to [place].  If you have drug allergies check your prescription carefully.    You can use MyChart to ask questions about today's visit, request a non-urgent call back, or ask for a work or school excuse for 24 hours related to this e-Visit. If it has been greater than 24 hours you will need to follow up with your provider, or enter a new e-Visit to address those concerns.  You will get an e-mail in the next two days asking about your experience.  I hope that your e-visit has been valuable and will speed your recovery. Thank you for using e-visits.

## 2021-09-16 ENCOUNTER — Telehealth: Payer: Medicaid Other | Admitting: Physician Assistant

## 2021-09-16 DIAGNOSIS — K148 Other diseases of tongue: Secondary | ICD-10-CM

## 2021-09-16 MED ORDER — LIDOCAINE VISCOUS HCL 2 % MT SOLN: 5.0000 mL | Freq: Four times a day (QID) | OROMUCOSAL | 0 refills | Status: DC | PRN

## 2021-09-16 MED ORDER — SULFAMETHOXAZOLE-TRIMETHOPRIM 800-160 MG PO TABS: 1.0000 | ORAL_TABLET | Freq: Two times a day (BID) | ORAL | 0 refills | Status: DC

## 2021-09-16 NOTE — Addendum Note (Signed)
Addended by: Bennie Pierini on: 09/16/2021 07:18 AM   Modules accepted: Orders

## 2021-09-16 NOTE — Progress Notes (Signed)
E-Visit for Mouth Ulcers  We are sorry that you are not feeling well.  Here is how we plan to help!  Based on what you have shared with me, it appears that you do have mouth ulcer(s).     The following medications should decrease the discomfort and help with healing -- Magic Mouthwash w/ Lidocaine  Mouth ulcers are painful areas in the mouth and gums. These are also known as "canker sores".  They can occur anywhere inside the mouth. While mostly harmless, mouth ulcers can be extremely uncomfortable and may make it difficult to eat, drink, and brush your teeth.  You may have more than 1 ulcer and they can vary and change in size. Mouth ulcers are not contagious and should not be confused with cold sores.  Cold sores appear on the lip or around the outside of the mouth and often begin with a tingling, burning or itching sensation.   While the exact causes are unknown, some common causes and factors that may aggravate mouth ulcers include: Genetics - Sometimes mouth ulcers run in families High alcohol intake Acidic foods such as citrus fruits like pineapple, grapefruit, orange fruits/juices, may aggravate mouth ulcers Other foods high in acidity or spice such as coffee, chocolate, chips, pretzels, eggs, nuts, cheese Quitting smoking Injury caused by biting the tongue or inside of the cheek Diet lacking in B-12, zinc, folic acid or iron Female hormone shifts with menstruation Excessive fatigue, emotional stress or anxiety Prevention: Talk to your doctor if you are taking meds that are known to cause mouth ulcers such as:   Anti-inflammatory drugs (for example Ibuprofen, Naproxen sodium), pain killers, Beta blockers, Oral nicotine replacement drugs, Some street drugs (heroin).   Avoid allowing any tablets to dissolve in your mouth that are meant to swallowed whole Avoid foods/drinks that trigger or worsen symptoms Keep your mouth clean with daily brushing and flossing  Home Care: The goal with  treatment is to ease the pain where ulcers occur and help them heal as quickly as possible.  There is no medical treatment to prevent mouth ulcers from coming back or recurring.  Avoid spicy and acidic foods Eat soft foods and avoid rough, crunchy foods Avoid chewing gum Do not use toothpaste that contains sodium lauryl sulphite Use a straw to drink which helps avoid liquids toughing the ulcers near the front of your mouth Use a very soft toothbrush If you have dentures or dental hardware that you feel is not fitting well or contributing to his, please see your dentist. Use saltwater mouthwash which helps healing. Dissolve a  teaspoon of salt in a glass of warm water. Swish around your mouth and spit it out. This can be used as needed if it is soothing.   GET HELP RIGHT AWAY IF: Persistent ulcers require checking IN PERSON (face to face). Any mouth lesion lasting longer than a month should be seen by your DENTIST as soon as possible for evaluation for possible oral cancer. If you have a non-painful ulcer in 1 or more areas of your mouth Ulcers that are spreading, are very large or particularly painful Ulcers last longer than one week without improving on treatment If you develop a fever, swollen glands and begin to feel unwell Ulcers that developed after starting a new medication MAKE SURE YOU: Understand these instructions. Will watch your condition. Will get help right away if you are not doing well or get worse.  Thank you for choosing an e-visit.  Your e-visit  answers were reviewed by a board certified advanced clinical practitioner to complete your personal care plan. Depending upon the condition, your plan could have included both over the counter or prescription medications.  Please review your pharmacy choice. Make sure the pharmacy is open so you can pick up prescription now. If there is a problem, you may contact your provider through [employer] and have the prescription  routed to another pharmacy.  Your safety is important to [place]. If you have drug allergies check your prescription carefully.   For the next 24 hours you can use MyChart to ask questions about today's visit, request a non-urgent call back, or ask for a work or school excuse. You will get an email in the next two days asking about your experience. I hope that your e-visit has been valuable and will speed your recovery.  This appointment required 5-10 minutes of patient care (this includes precharting, chart review, review of results, face-to-face care, etc.).   Jarold Motto PA-C

## 2021-09-19 NOTE — Telephone Encounter (Signed)
Patient has been called and informed of medication being sent.

## 2021-09-22 ENCOUNTER — Telehealth: Payer: Medicaid Other | Admitting: Physician Assistant

## 2021-09-22 ENCOUNTER — Other Ambulatory Visit: Payer: Self-pay

## 2021-09-22 DIAGNOSIS — R197 Diarrhea, unspecified: Secondary | ICD-10-CM

## 2021-09-22 MED ORDER — ONDANSETRON HCL 4 MG PO TABS: 4.0000 mg | ORAL_TABLET | Freq: Three times a day (TID) | ORAL | 0 refills | Status: DC | PRN

## 2021-09-22 NOTE — Patient Outreach (Signed)
[facility]) Care Management  09/22/2021  ARLY SALMINEN 03-Aug-1985 [id number]  LCSW completed [facility] outreach attempt today during scheduled appointment time but was unable to reach patient successfully. A HIPPA compliant voice message was left encouraging patient to return call once available. LCSW will ask Scheduling Care Guide to reschedule [facility] SW appointment with patient as well.  Dickie La, BSW, MSW, [employer] Managed Medicaid LCSW [facility]  [facility] [email or web address] Phone: [phone]

## 2021-09-22 NOTE — Progress Notes (Signed)
We are sorry that you are not feeling well.  Here is how we plan to help!  Based on what you have shared with me it looks like you have Acute Infectious Diarrhea.  Most cases of acute diarrhea are due to infections with virus and bacteria and are self-limited conditions lasting less than 14 days.  For your symptoms you may take Imodium 2 mg tablets that are over the counter at your local pharmacy. Take two tablet now and then one after each loose stool up to 6 a day.  Antibiotics are not needed for most people with diarrhea.  Optional: Zofran 4 mg 1 tablet every 8 hours as needed for nausea and vomiting   HOME CARE We recommend changing your diet to help with your symptoms for the next few days. Drink plenty of fluids that contain water salt and sugar. Sports drinks such as Gatorade may help.  You may try broths, soups, bananas, applesauce, soft breads, mashed potatoes or crackers.  You are considered infectious for as long as the diarrhea continues. Hand washing or use of alcohol based hand sanitizers is recommend. It is best to stay out of work or school until your symptoms stop.   GET HELP RIGHT AWAY If you have dark yellow colored urine or do not pass urine frequently you should drink more fluids.   If your symptoms worsen  If you feel like you are going to pass out (faint) You have a new problem  MAKE SURE YOU  Understand these instructions. Will watch your condition. Will get help right away if you are not doing well or get worse.  Thank you for choosing an e-visit.  Your e-visit answers were reviewed by a board certified advanced clinical practitioner to complete your personal care plan. Depending upon the condition, your plan could have included both over the counter or prescription medications.  Please review your pharmacy choice. Make sure the pharmacy is open so you can pick up prescription now. If there is a problem, you may contact your provider through MyChart  messaging and have the prescription routed to another pharmacy.  Your safety is important to us. If you have drug allergies check your prescription carefully.   For the next 24 hours you can use MyChart to ask questions about today's visit, request a non-urgent call back, or ask for a work or school excuse. You will get an email in the next two days asking about your experience. I hope that your e-visit has been valuable and will speed your recovery.  I provided 5 minutes of non face-to-face time during this encounter for chart review and documentation.    

## 2021-09-22 NOTE — Patient Instructions (Signed)
Willie E Borba ,   The Medicaid Managed Care Team is available to provide assistance to you with your healthcare needs at no cost and as a benefit of your Medicaid Health plan. I'm sorry I was unable to reach you today for our scheduled appointment. Our care guide will call you to reschedule our telephone appointment. Please call me at the number below. I am available to be of assistance to you regarding your healthcare needs. .   Thank you,   Danyella Mcginty, BSW, MSW, LCSW Managed Medicaid LCSW [place]  [facility] [email or web address] Phone: [phone]   

## 2021-09-26 ENCOUNTER — Ambulatory Visit
Admission: RE | Admit: 2021-09-26 | Discharge: 2021-09-26 | Disposition: A | Discharge status: Home / Self Care | Payer: Medicaid Other | Source: Ambulatory Visit | Attending: Family Medicine | Admitting: Family Medicine

## 2021-09-26 ENCOUNTER — Other Ambulatory Visit: Payer: Self-pay | Admitting: Family Medicine

## 2021-09-26 VITALS — BP 137/91 | HR 76 | Temp 98.3°F

## 2021-09-26 DIAGNOSIS — R197 Diarrhea, unspecified: Secondary | ICD-10-CM | POA: Diagnosis not present

## 2021-09-26 DIAGNOSIS — R1084 Generalized abdominal pain: Secondary | ICD-10-CM

## 2021-09-26 DIAGNOSIS — I10 Essential (primary) hypertension: Secondary | ICD-10-CM

## 2021-09-26 NOTE — Telephone Encounter (Signed)
Requested medication (s) are due for refill today:   Yes for both  Requested medication (s) are on the active medication list:   Yes for both  Future visit scheduled:   Yes 10/30/2021   Last ordered: 08/29/2021 #30, 0 refills for both  Returned for provider to review for refills since courtesy refill was given in May.   Has upcoming appt. But note was for her to keep this appt for refills.   Requested Prescriptions  Pending Prescriptions Disp Refills   amLODipine (NORVASC) 10 MG tablet [Pharmacy Med Name: AMLODIPINE BESYLATE 10MG  TABLETS] 30 tablet 0    Sig: TAKE 1 TABLET(10 MG) BY MOUTH DAILY     Cardiovascular: Calcium Channel Blockers 2 Failed - 09/26/2021  3:20 AM      Failed - Last BP in normal range    BP Readings from Last 1 Encounters:  08/28/21 (!) 143/99         Passed - Last Heart Rate in normal range    Pulse Readings from Last 1 Encounters:  07/04/21 (!) 107         Passed - Valid encounter within last 6 months    Recent Outpatient Visits           1 week ago Grief reaction   [place], [place], [place]   5 months ago Exposure to pneumonia   [place], [place] W, NP   7 months ago Screening examination for STD (sexually transmitted disease)   [place], [place], [place]   1 year ago Anxiety and depression   [place], [place], MD   1 year ago Sore throat   [facility], [place]rip, PA-C       Future Appointments             Today  [facility]   In 1 month [name], MD [place]              losartan (COZAAR) 100 MG tablet [Pharmacy Med Name: LOSARTAN 100MG  TABLETS] 30 tablet 0    Sig: TAKE 1 TABLET(100 MG) BY MOUTH DAILY     Cardiovascular:  Angiotensin Receptor Blockers Failed - 09/26/2021  3:20 AM       Failed - Cr in normal range and within 180 days    Creatinine, Ser  Date Value Ref Range Status  02/23/2021 0.77 0.57 - 1.00 mg/dL Final         Failed - K in normal range and within 180 days    Potassium  Date Value Ref Range Status  02/23/2021 4.2 3.5 - 5.2 mmol/L Final         Failed - Last BP in normal range    BP Readings from Last 1 Encounters:  08/28/21 (!) 143/99         Passed - Patient is not pregnant      Passed - Valid encounter within last 6 months    Recent Outpatient Visits           1 week ago Grief reaction   [place], [place]   5 months ago Exposure to pneumonia   [place], [place] W, NP   7 months ago Screening examination for STD (sexually transmitted disease)  [place], Dionne [place]cy, [place]   1 year ago Anxiety and depression   [place], [place], MD   1 year ago Sore throat   [facility] at [facility], Loraine Grip, [place]       Future Appointments             Today  [facility]   In 1 month [name], MD [place]

## 2021-09-26 NOTE — Discharge Instructions (Signed)
Stop using pepto - your stool should return to a normal color in a few days. You shouldn't need to use Imodium again.   Drink plenty of fluids. Eat a simple diet of bland food for a couple of days.  Then gradually return to your regular diet adding spicy, greasy, high-fat foods last.  Dairy products upset your stomach or make your symptoms worse, also avoid those.  If you begin vomiting and cannot stop, vomit blood, vomit., or develop a high fever -please be seen in the emergency room.

## 2021-09-26 NOTE — ED Triage Notes (Signed)
Patient initially 1 week ago had diarrhea took some pepto and then became constipated went 4 days without BM and then Diarrhea started again with Vomiting, Last vomit was yesterday, had BM before coming that was very black and semi loose. Having headache, back pain and abdominal pain. Diarrhea started after taking abx.    History of Acid Reflux

## 2021-09-26 NOTE — ED Notes (Signed)
Patient initially 1 week ago had diarrhea took some pepto and then became constipated went 4 days without BM and then Diarrhea started again with Vomiting, Last vomit was yesterday, had BM before coming that was very black and semi loose. Having headache, back pain and abdominal pain  History of Acid Reflux  Pain 8/10

## 2021-09-29 ENCOUNTER — Other Ambulatory Visit: Payer: Self-pay | Admitting: Pharmacist

## 2021-09-29 MED ORDER — NICOTINE 21 MG/24HR TD PT24: 21.0000 mg | MEDICATED_PATCH | Freq: Every day | TRANSDERMAL | 1 refills | Status: DC

## 2021-09-29 NOTE — ED Provider Notes (Signed)
[facility]    CSN: [id number] Arrival date & time: 09/26/21  1903      History   Chief Complaint Chief Complaint  Patient presents with   Constipation    I have been constipated & diarrhea &feeling like I'm going to throw up my bowel movements for the last week and a half I wake up in the middle of the night  sweating and straining to use the bathroom and then when I do  it comes out as diarrhea. - Entered by patient   Emesis   Abdominal Pain   Headache   Nausea   Diarrhea    HPI Ann Moran is a 36 y.o. female. Pt had difficulty explaining the timing and progression of symptoms. It seems pt had diarrhea about 10 days ago. STarted after she had antibiotics for an infection and after eating at [employer] - thinks food made her sick. Was advised in an e-visit she could use Imodium which she took and it halted her diarrhea and she became worried she was constipated. Does not describe any hard stool over last 10 days but does describe having to strain to produce bowel movement of loosely formed stool. Next patient switched to taking pepto bismol. Now stool is black and she is worried. Reports trying to eat more salads but not having much of an appetite. Vomited once yesterday. No fever or chills. No blood or mucus in her stool.    Constipation Associated symptoms: abdominal pain, diarrhea and vomiting   Emesis Associated symptoms: abdominal pain, diarrhea and headaches   Abdominal Pain Associated symptoms: constipation, diarrhea and vomiting   Headache Associated symptoms: abdominal pain, diarrhea and vomiting   Diarrhea Associated symptoms: abdominal pain, headaches and vomiting     Past Medical History:  Diagnosis Date   Allergy    Anemia    Anxiety    Chlamydia    Depression    hx of meds, none currently- doing ok   Diabetes mellitus without complication (HCC)    GERD (gastroesophageal reflux disease)    Infection due to trichomonas    MRSA (methicillin  resistant staph aureus) culture positive    Dec 2012   Obesity    Pregnancy induced hypertension    Urinary tract infection     Patient Active Problem List   Diagnosis Date Noted   Smoking 02/23/2021   Patellar tendinitis of both knees 01/27/2021   Pain in joint involving right ankle and foot 01/27/2021   Thoracic back pain 12/23/2020   Right elbow pain 12/23/2020   Chronic pain of right knee 12/23/2020   Ankle fracture, left 08/09/2020   Dysphagia 05/20/2019   Encounter for other preprocedural examination 05/20/2019   PCOS (polycystic ovarian syndrome) 02/24/2019   Neck pain 02/24/2019   Morbid obesity with body mass index (BMI) of 45.0 to 49.9 in adult [facility]) 02/24/2019   Essential hypertension 02/24/2019   Gastroesophageal reflux disease 02/24/2019   Irregular menstrual cycle 01/14/2019   Abnormal uterine bleeding 01/14/2019   Chronic cough 01/14/2019    Past Surgical History:  Procedure Laterality Date   CHOLECYSTECTOMY     ECTOPIC PREGNANCY SURGERY      OB History     Gravida  2   Para  1   Term  0   Preterm  1   AB  1   Living  1      SAB      IAB      Ectopic  1  Multiple      Live Births  1            Home Medications    Prior to Admission medications   Medication Sig Start Date End Date Taking? Authorizing Provider  albuterol (VENTOLIN HFA) 108 (90 Base) MCG/ACT inhaler Inhale 2 puffs into the lungs every 6 (six) hours as needed for wheezing or shortness of breath (Cough). Patient not taking: Reported on 08/28/2021 07/04/21   Theadora RamaMorgan, Lindsay Scales, PA-C  amLODipine (NORVASC) 10 MG tablet TAKE 1 TABLET(10 MG) BY MOUTH DAILY 09/26/21   Hoy RegisterNewlin, Enobong, MD  Blood Pressure Monitoring (BLOOD PRESSURE CUFF) MISC Use to check blood pressure once daily. 08/29/21   Hoy RegisterNewlin, Enobong, MD  busPIRone (BUSPAR) 15 MG tablet Take 1 tablet (15 mg total) by mouth 2 (two) times daily. 09/14/21   Anders SimmondsMcClung, Donielle Kaigler M, PA-C  cetirizine (ZYRTEC ALLERGY) 10 MG tablet  Take 1 tablet (10 mg total) by mouth at bedtime. 07/04/21 12/31/21  Theadora RamaMorgan, Lindsay Scales, PA-C  diclofenac (VOLTAREN) 75 MG EC tablet TAKE 1 TABLET(75 MG) BY MOUTH TWICE DAILY 09/15/21   [name], Azucena FallenShane R, MD  fluticasone (FLONASE) 50 MCG/ACT nasal spray Place 2 sprays into both nostrils daily. 07/04/21   Theadora RamaMorgan, Lindsay Scales, PA-C  guaifenesin (HUMIBID E) 400 MG TABS tablet Take 1 tablet 3 times daily as needed for chest congestion and cough 07/04/21   Theadora RamaMorgan, Lindsay Scales, PA-C  hydrOXYzine (ATARAX) 25 MG tablet Take 1 tablet (25 mg total) by mouth every 8 (eight) hours as needed for anxiety. 08/29/21   Hoy RegisterNewlin, Enobong, MD  ipratropium (ATROVENT) 0.06 % nasal spray Place 2 sprays into both nostrils 4 (four) times daily. As needed for nasal congestion, runny nose 07/04/21   Theadora RamaMorgan, Lindsay Scales, PA-C  losartan (COZAAR) 100 MG tablet TAKE 1 TABLET(100 MG) BY MOUTH DAILY 09/26/21   Hoy RegisterNewlin, Enobong, MD  magic mouthwash (lidocaine, diphenhydrAMINE, alum & mag hydroxide) suspension Swish and spit 5 mLs 4 (four) times daily as needed for mouth pain. 09/16/21   Jarold MottoWorley, Samantha, PA  mupirocin ointment (BACTROBAN) 2 % Apply 1 application topically 2 (two) times daily. Prn boils 02/23/21   Anders SimmondsMcClung, Antonisha Waskey M, PA-C  omeprazole (PRILOSEC) 40 MG capsule Take 1 capsule (40 mg total) by mouth daily. 09/15/21   Marcine MatarJohnson, Deborah B, MD  ondansetron (ZOFRAN) 4 MG tablet Take 1 tablet (4 mg total) by mouth every 8 (eight) hours as needed for nausea or vomiting. 09/22/21   Margaretann LovelessBurnette, Jennifer M, PA-C  sertraline (ZOLOFT) 50 MG tablet Take 1 tablet (50 mg total) by mouth daily. 09/14/21   Anders SimmondsMcClung, Zharia Conrow M, PA-C  sulfamethoxazole-trimethoprim (BACTRIM DS) 800-160 MG tablet Take 1 tablet by mouth 2 (two) times daily. 09/16/21   Daphine DeutscherMartin, Mary-Margaret, FNP  traZODone (DESYREL) 50 MG tablet Take 0.5-1 tablets (25-50 mg total) by mouth at bedtime as needed for sleep. 09/14/21   Anders SimmondsMcClung, Tyshana Nishida M, PA-C  medroxyPROGESTERone (PROVERA) 5 MG  tablet Take 2 tablets (10 mg total) by mouth daily. Patient not taking: Reported on 07/20/2018 06/30/18 12/06/18  Geoffery Lyonselo, Douglas, MD    Family History Family History  Problem Relation Age of Onset   Hypertension Mother    Diabetes Mother    Asthma Father    Hypertension Father    Stroke Maternal Grandmother    Diabetes Paternal Grandmother    Hypertension Paternal Grandmother    Colon cancer Paternal Grandmother    Colon cancer Paternal Aunt    Esophageal cancer Neg Hx  Stomach cancer Neg Hx    Rectal cancer Neg Hx     Social History Social History   Tobacco Use   Smoking status: Every Day    Packs/day: 0.50    Years: 9.00    Total pack years: 4.50    Types: Cigarettes   Smokeless tobacco: Never  Vaping Use   Vaping Use: Some days   Last attempt to quit: 08/18/2015   Devices: "rarely"  Substance Use Topics   Alcohol use: Yes   Drug use: No     Allergies   Patient has no known allergies.   Review of Systems Review of Systems  Gastrointestinal:  Positive for abdominal pain, constipation, diarrhea and vomiting.  Neurological:  Positive for headaches.     Physical Exam Triage Vital Signs ED Triage Vitals [09/26/21 1937]  Enc Vitals Group     BP (!) 137/91     Pulse Rate 76     Resp      Temp 98.3 F (36.8 C)     Temp Source Oral     SpO2 97 %     Weight      Height      Head Circumference      Peak Flow      Pain Score 8     Pain Loc      Pain Edu?      Excl. in GC?    No data found.  Updated Vital Signs BP (!) 137/91 (BP Location: Left Arm)   Pulse 76   Temp 98.3 F (36.8 C) (Oral)   SpO2 97%   Visual Acuity Right Eye Distance:   Left Eye Distance:   Bilateral Distance:    Right Eye Near:   Left Eye Near:    Bilateral Near:     Physical Exam Constitutional:      General: She is not in acute distress.    Appearance: She is well-developed. She is not ill-appearing.  Cardiovascular:     Rate and Rhythm: Normal rate and regular  rhythm.  Abdominal:     General: Bowel sounds are normal. There is no distension.     Palpations: Abdomen is soft.     Tenderness: There is generalized abdominal tenderness. There is no guarding or rebound.  Neurological:     Mental Status: She is alert.      UC Treatments / Results  Labs (all labs ordered are listed, but only abnormal results are displayed) Labs Reviewed - No data to display  EKG   Radiology No results found.  Procedures Procedures (including critical care time)  Medications Ordered in UC Medications - No data to display  Initial Impression / Assessment and Plan / UC Course  I have reviewed the triage vital signs and the nursing notes.  Pertinent labs & imaging results that were available during my care of the patient were reviewed by me and considered in my medical decision making (see chart for details).    Pt's diarrhea started after she took antibiotics. Contaminated food may have also contributed to symptoms. I think the trajectory of her symptoms is related to her use of medications. Pepto liking causing black stool. Imodium likely halted diarrhea but the "constipation" pt describes does not sound like constipation but rather a lack of bowel movements after a bout of diarrhea. I suspect she had no stool to pass, not that she was constipated. Discussed stopping all meds for diarrhea/constipation issues, eating a bland, simple diet for a day  or two, drinking lots of fluids, and when she is feeling better, to gradually resune regular diet. Discussed reasons for seeking higher level of care.   Final Clinical Impressions(s) / UC Diagnoses   Final diagnoses:  Diarrhea of presumed infectious origin  Generalized abdominal pain     Discharge Instructions      Stop using pepto - your stool should return to a normal color in a few days. You shouldn't need to use Imodium again.   Drink plenty of fluids. Eat a simple diet of bland food for a couple of days.   Then gradually return to your regular diet adding spicy, greasy, high-fat foods last.  Dairy products upset your stomach or make your symptoms worse, also avoid those.  If you begin vomiting and cannot stop, vomit blood, vomit., or develop a high fever -please be seen in the emergency room.   ED Prescriptions   None    PDMP not reviewed this encounter.   Cathlyn Parsons, NP 09/29/21 [phone]

## 2021-09-29 NOTE — Chronic Care Management (AMB) (Signed)
Chief Complaint  Patient presents with   Hypertension    Ann Moran is a 36 y.o. year old female who presented for a telephone visit.   They were referred to the pharmacist by a quality report for assistance in managing hypertension.   Patient is participating in a Managed Medicaid Plan:  Yes  Subjective:  Care Team: Primary Care Provider: Hoy Register, MD ; Next Scheduled Visit: 10/30/21 GI: Leone Payor; Next Scheduled Visit: 10/12/21  Medication Access/Adherence  Current Pharmacy:  [facility] [phone] - [place], [place] - [phone] AT [facility] [address] & [address] [place] [address] Phone: [phone] Fax: [phone]  [facility] - [place], [place] - [address] [place] [address] Phone: [phone] Fax: [phone]  [id number] - [place], [place] - [address] AT [facility] OF [address] [place] [address] Phone: [phone] Fax: [phone]   Patient reports affordability concerns with their medications: No  Patient reports access/transportation concerns to their pharmacy: No  Patient reports adherence concerns with their medications:  No     Hypertension:  Current medications: amlodipine 10 mg daily, losartan 100 mg daily  Patient has a validated, automated, upper arm home BP cuff Current blood pressure readings readings:   6/6: 9 am: 209/126 85; however, BP reading in the ED the other day was 137/91  Patient denies hypotensive s/sx including dizziness, lightheadedness.  Patient reports hypertensive symptoms including headache, chest pain, shortness of breath - however, also dealing with panic  Reports she is interested in a referral to cardiology - reports she has a strong family history of aneurysm, as well both of her parents having had a heart attack in their late 71s, with her father just having  another heart attack in November (now in his 46s)  Both patients had MI in 57s; dad just had another MI in November. Anerysm - colon cancer screen    Depression/Anxiety:  Current medications: sertraline 50 mg daily, hydroxyzine 25 mg PRN, trazodone 25-50 mg QPM  - reports she still struggles with daily feelings of panic. Little benefit from hydroxyzine, trazodone, or melatonin for sleep - taking Tylenol PM QPM  Behavioral Health support: appointment with LCSW on Monday. Referral for psych has been placed.   Diarrhea: Reports her diarrhea has resolved, but notes some issues swallowing. Also notes a positive family history of colon cancer and she is interested in pursuing screening.   Tobacco Abuse:  Tobacco Use History: Age when started using tobacco on a daily basis: 36 years of age Number of cigarettes per day: 1 ppd Triggers include stress  Quit Attempt History: Most recent quit attempt: never Methods tried in the past include: none.     Health Maintenance  Health Maintenance Due  Topic Date Due   COVID-19 Vaccine (2 - Pfizer series) 11/26/2019     Objective: Lab Results  Component Value Date   HGBA1C 5.7 (H) 02/23/2021    Lab Results  Component Value Date   CREATININE 0.77 02/23/2021   BUN 6 02/23/2021   NA 142 02/23/2021   K 4.2 02/23/2021   CL 102 02/23/2021   CO2 26 02/23/2021    No results found for: "CHOL", "HDL", "LDLCALC", "LDLDIRECT", "TRIG", "CHOLHDL"  Medications Reviewed Today     Reviewed by Alden Hipp, RPH-CPP (Pharmacist) on 09/29/21 at 1002  Med List Status: <None>   Medication Order Taking? Sig  Documenting Provider Last Dose Status Informant  albuterol (VENTOLIN HFA) 108 (90 Base) MCG/ACT inhaler [id number] Yes Inhale 2 puffs into the lungs every 6 (six) hours as needed for wheezing or shortness of breath (Cough). Theadora Rama Scales, PA-C Taking Active   amLODipine (NORVASC) 10 MG tablet [id number] Yes TAKE 1 TABLET(10 MG) BY  MOUTH DAILY Hoy Register, MD Taking Active   Blood Pressure Monitoring (BLOOD PRESSURE CUFF) MISC [id number] Yes Use to check blood pressure once daily. Hoy Register, MD Taking Active   busPIRone (BUSPAR) 15 MG tablet [id number] Yes Take 1 tablet (15 mg total) by mouth 2 (two) times daily. Georgian Co M, PA-C Taking Active   cetirizine (ZYRTEC ALLERGY) 10 MG tablet [id number] Yes Take 1 tablet (10 mg total) by mouth at bedtime. Theadora Rama Scales, PA-C Taking Active   diclofenac (VOLTAREN) 75 MG EC tablet [id number] Yes TAKE 1 TABLET(75 MG) BY MOUTH TWICE DAILY [name], Azucena Fallen, MD Taking Active   fluticasone (FLONASE) 50 MCG/ACT nasal spray [id number] No Place 2 sprays into both nostrils daily.  Patient not taking: Reported on 09/29/2021   Theadora Rama Scales, PA-C Not Taking Active   hydrOXYzine (ATARAX) 25 MG tablet [id number] Yes Take 1 tablet (25 mg total) by mouth every 8 (eight) hours as needed for anxiety. Hoy Register, MD Taking Active   ipratropium (ATROVENT) 0.06 % nasal spray [id number] No Place 2 sprays into both nostrils 4 (four) times daily. As needed for nasal congestion, runny nose  Patient not taking: Reported on 09/29/2021   Theadora Rama Scales, PA-C Not Taking Active   losartan (COZAAR) 100 MG tablet [id number] Yes TAKE 1 TABLET(100 MG) BY MOUTH DAILY Hoy Register, MD Taking Active    Patient not taking:   Discontinued 12/06/18 0950 omeprazole (PRILOSEC) 40 MG capsule [id number] Yes Take 1 capsule (40 mg total) by mouth daily. Marcine Matar, MD Taking Active   ondansetron [facility]) 4 MG tablet [id number] No Take 1 tablet (4 mg total) by mouth every 8 (eight) hours as needed for nausea or vomiting.  Patient not taking: Reported on 09/29/2021   [name], PA-C Not Taking Active   sertraline (ZOLOFT) 50 MG tablet [id number] Yes Take 1 tablet (50 mg total) by mouth daily. Anders Simmonds, PA-C Taking Active   traZODone (DESYREL) 50 MG tablet [id number] Yes  Take 0.5-1 tablets (25-50 mg total) by mouth at bedtime as needed for sleep. Anders Simmonds, PA-C Taking Active             SDOH Interventions    Flowsheet Row Most Recent Value  SDOH Interventions   SDOH Interventions for the Following Domains Tobacco, Stress, Depression  Stress Interventions Provide Counseling  [LCSW]  Tobacco Interventions Cessation Materials Given and Reviewed, Other (Comment)  [[place] Quitline]  Depression Interventions/Treatment  Currently on Treatment  [LCSQ]       Assessment/Plan:  Care Plan : Medication Management  Updates made by Alden Hipp, RPH-CPP since 09/29/2021 12:00 AM     Problem: Hypertension      Long-Range Goal: BP <130/80   Note:   Current Barriers:  Unable to maintain control of blood pressure  Patient Needs: Therapy optimization Evaluation for cardiovascular risk  Patient Activities: Patient will:  - check blood pressure daily, document, and provide at future appointments - Work towards tobacco cessation     Hypertension: - Currently uncontrolled - Reviewed long term cardiovascular and renal outcomes of uncontrolled blood pressure - Reviewed appropriate blood pressure monitoring technique and  reviewed goal blood pressure. Wonder if home readings are inaccurate given extreme elevation in comparison to ED reading. Recommended to check home blood pressure and heart rate daily. Encouraged to bring home cuff with her to next PCP appointment.  - Recommend to continue current regimen at this time. Tobacco cessation as below   Depression/Anxiety: - Currently uncontrolled - Reviewed that starting sertraline could have contributed to diarrhea. Reviewed that it can take several weeks for benefit of SSRI to be seen. Encouraged continued adherence to SSRI and use of PRN medications for panic/sleep. Could consider benzodiazepine for panic disorder while patient is establishing on SSRI therapy.\ - Reviewed behavioral health support  strategies including continued collaboration with LCSW/psych  GI Concerns: - Encouraged to discuss family history of colon cancer with Dr. Leone PayorGessner at next appointment to discuss screening recommendations.   Tobacco Abuse - Currently uncontrolled but patient ready to consider quitting.  - Provided motivational interviewing to assess tobacco use and strategies for reduction. Discussed that she likes to read and listen to music when she is anxious. Encouraged to utilize these tools instead of tobacco - Provided information on 1 800 QUIT NOW support program - Discussed varenicline vs dual NRT. Would recommend dual NRT at this time given uncontrolled anxiety/depression. Will collaborate with PCP team to start therapy, as Medicaid covers. Will also encourage her to contact her plan to investigate what resources they offer to help quit.   Follow Up Plan: 2 weeks  [name]. Riley Hallum, PharmD, [facility] [phone]

## 2021-09-29 NOTE — Patient Instructions (Signed)
Ann Moran,   It was great talking to you!  Unfortunately, it can take several weeks for sertraline to reach its full benefit. Continue to use hydroxyzine as needed for episodes of anxiety/panic. Continue to work with Ann Moran on strategies to manage these symptoms.   Talk to Dr. Leone Moran about your family history of colon cancer.   I will message Dr. Alvis Moran about your concerns about your family history of cardiovascular disease. I will also talk to her about your desire to try nicotine patches + gum to quit smoking. In the meantime, I also encourage you to call the [place] Quitline (1-800-QUIT-NOW) or visit their website. It also looks like your insurance may have a program to help you quit smoking- try calling your member services at (231) [phone] to ask about this.   Check your blood pressure once daily, and any time you have concerning symptoms like headache, chest pain, dizziness, shortness of breath, or vision changes.   Our goal is less than 130/80.  To appropriately check your blood pressure, make sure you do the following:  1) Avoid caffeine, exercise, or tobacco products for 30 minutes before checking. Empty your bladder. 2) Sit with your back supported in a flat-backed chair. Rest your arm on something flat (arm of the chair, table, etc). 3) Sit still with your feet flat on the floor, resting, for at least 5 minutes.  4) Check your blood pressure. Take 1-2 readings.  5) Write down these readings and bring with you to any provider appointments.  Bring your home blood pressure machine with you to a provider's office for accuracy comparison at least once a year.   Make sure you take your blood pressure medications before you come to any office visit, even if you were asked to fast for labs.  I'll call you in two weeks. Let me know if you need anything in the meantime.   Take care,   Ann Moran, PharmD, [facility] [phone]

## 2021-10-02 ENCOUNTER — Other Ambulatory Visit: Payer: Self-pay | Admitting: Licensed Clinical Social Worker

## 2021-10-02 NOTE — Patient Instructions (Signed)
Visit Information  Ms. Pinkstaff was given information about Medicaid Managed Care team care coordination services as a part of their [employer] Medicaid benefit. [name] verbally consentedto engagement with the [facility] Managed Care team.   If you are experiencing a medical emergency, please call 911 or report to your local emergency department or urgent care.   If you have a non-emergency medical problem during routine business hours, please contact your provider's office and ask to speak with a nurse.   For questions related to your [facility] health plan, please call: [phone]  OR visit the member homepage at: [email or web address]  If you would like to schedule transportation through your [facility] plan, please call the following number at least 2 days in advance of your appointment: [phone]  If you are experiencing a behavioral health crisis, call the [facility] Crisis Line at [phone]). The line is available 24 hours a day, seven days a week.  If you would like help to quit smoking, call 1-800-QUIT-NOW (517-350-7520) OR Espaol: 1-855-Djelo-Ya (3-903-009-2330) o para ms informacin haga clic aqu or Text READY to 076-226 to register via text  Following is a copy of your plan of care:  There are no care plans that you recently modified to display for this patient.

## 2021-10-02 NOTE — Patient Outreach (Signed)
[facility]) Care Management  10/02/2021  Ann Moran [birth year] [id number]  [facility] LCSW was able to successfully reach patient by phone for scheduled [facility] social work appointment today but patient questioned if appointment could be rescheduled for another time and date as she is currently driving her daughter to a medial appointment. [facility] LCSW rescheduled [facility] appointment for tomorrow on 10/03/21 at 9:30 am.  Dickie La, BSW, MSW, LCSW Managed Medicaid LCSW [facility]  [facility] [email or web address] Phone: [phone]

## 2021-10-03 ENCOUNTER — Other Ambulatory Visit: Payer: Self-pay | Admitting: Licensed Clinical Social Worker

## 2021-10-03 NOTE — Patient Outreach (Signed)
Medicaid Managed Care Social Work Note  10/03/2021 Name:  Ann Moran MRN:  [id number] DOB:  01-03-1986  Ann Moran is an 36 y.o. year old female who is a primary patient of [name], MD.  The Medicaid Managed Care Coordination team was consulted for assistance with:  [place] and Resources  [name] was given information about Medicaid Managed Care Coordination team services today. [name] Patient agreed to services and verbal consent obtained.  Engaged with patient  for by telephone forfollow up visit in response to referral for case management and/or care coordination services.   Assessments/Interventions:  Review of past medical history, allergies, medications, health status, including review of consultants reports, laboratory and other test data, was performed as part of comprehensive evaluation and provision of chronic care management services.  SDOH: (Social Determinant of Health) assessments and interventions performed: SDOH Interventions    Flowsheet Row Most Recent Value  SDOH Interventions   Stress Interventions Offered [employer], Provide Counseling       Advanced Directives Status:  See Care Plan for related entries.  Care Plan                 No Known Allergies  Medications Reviewed Today     Reviewed by Greg Cutter, LCSW (Social Worker) on 10/03/21 at 110  Med List Status: <None>   Medication Order Taking? Sig Documenting Provider Last Dose Status Informant  albuterol (VENTOLIN HFA) 108 (90 Base) MCG/ACT inhaler [id number] No Inhale 2 puffs into the lungs every 6 (six) hours as needed for wheezing or shortness of breath (Cough). [name], PA-C Taking Active   amLODipine (NORVASC) 10 MG tablet [id number] No TAKE 1 TABLET(10 MG) BY MOUTH DAILY [name], MD Taking Active   Blood Pressure Monitoring (BLOOD PRESSURE CUFF) MISC [id number] No Use to check blood pressure once daily. [name], MD Taking Active   busPIRone (BUSPAR) 15 MG tablet [id number] No Take 1 tablet (15 mg total) by mouth 2 (two) times daily. Freeman Caldron M, PA-C Taking Active   cetirizine (ZYRTEC ALLERGY) 10 MG tablet [id number] No Take 1 tablet (10 mg total) by mouth at bedtime. [name], PA-C Taking Active   diclofenac (VOLTAREN) 75 MG EC tablet [id number] No TAKE 1 TABLET(75 MG) BY MOUTH TWICE DAILY [name], [name], MD Taking Active   fluticasone (FLONASE) 50 MCG/ACT nasal spray [id number] No Place 2 sprays into both nostrils daily.  Patient not taking: Reported on 09/29/2021   [name], PA-C Not Taking Active   hydrOXYzine (ATARAX) 25 MG tablet [id number] No Take 1 tablet (25 mg total) by mouth every 8 (eight) hours as needed for anxiety. [name], MD Taking Active   ipratropium (ATROVENT) 0.06 % nasal spray [id number] No Place 2 sprays into both nostrils 4 (four) times daily. As needed for nasal congestion, runny nose  Patient not taking: Reported on 09/29/2021   [name], PA-C Not Taking Active   losartan (COZAAR) 100 MG tablet [id number] No TAKE 1 TABLET(100 MG) BY MOUTH DAILY [name], MD Taking Active   Patient not taking:  Discontinued 12/06/18 0950 nicotine (NICODERM CQ - DOSED IN MG/24 HOURS) 21 mg/24hr patch [id number]  Place 1 patch (21 mg total) onto the skin daily. [name], MD  Active   omeprazole (PRILOSEC) 40 MG capsule [id number] No Take 1 capsule (40 mg total) by mouth daily. [name], MD Taking Active   ondansetron [facility]) 4  MG tablet [id number] No Take 1 tablet (4 mg total) by mouth every 8 (eight) hours as needed for nausea or vomiting.  Patient not taking: Reported on 09/29/2021   Mar Daring, PA-C Not Taking Active   sertraline (ZOLOFT) 50 MG tablet [id number] No Take 1 tablet (50 mg total) by mouth daily. [name], PA-C Taking Active   traZODone (DESYREL) 50 MG tablet [id number] No Take 0.5-1 tablets  (25-50 mg total) by mouth at bedtime as needed for sleep. [name], [place] Taking Active             Patient Active Problem List   Diagnosis Date Noted   Smoking 02/23/2021   Patellar tendinitis of both knees 01/27/2021   Pain in joint involving right ankle and foot 01/27/2021   Thoracic back pain 12/23/2020   Right elbow pain 12/23/2020   Chronic pain of right knee 12/23/2020   Ankle fracture, left 08/09/2020   Dysphagia 05/20/2019   Encounter for other preprocedural examination 05/20/2019   PCOS (polycystic ovarian syndrome) 02/24/2019   Neck pain 02/24/2019   Morbid obesity with body mass index (BMI) of 45.0 to 49.9 in adult [facility]) 02/24/2019   Essential hypertension 02/24/2019   Gastroesophageal reflux disease 02/24/2019   Irregular menstrual cycle 01/14/2019   Abnormal uterine bleeding 01/14/2019   Chronic cough 01/14/2019    Conditions to be addressed/monitored per PCP order:  Anxiety and Depression  Care Plan : LCSW Plan of Care  Updates made by Greg Cutter, LCSW since 10/03/2021 12:00 AM     Problem: Coping Skills (General Plan of Care)      Long-Range Goal: I need mental health support   Start Date: 09/05/2021  Priority: High  Note:   Priority: High  Timeframe:  Long-Range Goal Priority:  High Start Date:   09/05/21            Expected End Date:  11/02/21 at 1:00 pm                   Follow Up Date--11/02/21 at 1:00 pm.  - check out counseling - keep 90 percent of counseling appointments - schedule counseling appointment    Why is this important?             Beating depression may take some time.            If you don't feel better right away, don't give up on your treatment plan.    Current barriers:   Chronic Mental Health needs related to depression, insomnia, stress and anxiety. Patient requires Support, Education, Resources, Referrals, Advocacy, and Care Coordination, in order to meet Unmet Mental Health Needs. Patient will implement  clinical interventions discussed today to decrease symptoms of depression and increase knowledge and/or ability of: coping skills. Mental Health Concerns and Social Isolation Patient lacks knowledge of available community counseling agencies and resources.  Clinical Goal(s): verbalize understanding of plan for management of Anxiety, Depression, Insomnia and Stress and demonstrate a reduction in symptoms. Patient will connect with a provider for ongoing mental health treatment, increase coping skills, healthy habits, self-management skills, and stress reduction        Clinical Interventions:  Assessed patient's previous and current treatment, coping skills, support system and barriers to care. Patient and spouse provided hx  Verbalization of feelings encouraged, motivational interviewing employed Emotional support provided, positive coping strategies explored Self care/establishing healthy boundaries emphasized Patient reports that she has anxiety, depression and insomnia. She reports that  she is not sleeping which makes her feel worse. Patient reports significant worsening anxiety, depression and insomnia impacting their ability to function appropriately and carry out daily task. Patient has NO support network. She and her 14 yo daughter live together.  Patient is agreeable to referral to [facility] for counseling and psychiatry. [facility] LCSW made referral on 09/05/21. However, patient is in need of immediate services and assistance and was encouraged to consider their walk in clinic. She is agreeable to go tomorrow but will have to take her daughter into school later because of it. [facility] LCSW completed joint phone call to [place] with patient. [facility] [place] only has an inpatient unit available within [facility]. [facility] LCSW and patient left a [facility] with the [facility] to inquire if they would accept patient's insurance for medication management.  LCSW provided  education on relaxation techniques such as meditation, deep breathing, massage, grounding exercises or yoga that can activate the body's relaxation response and ease symptoms of stress and anxiety. LCSW ask that when pt is struggling with difficult emotions and racing thoughts that they start this relaxation response process. LCSW provided extensive education on healthy coping skills for anxiety. SW used active and reflective listening, validated patient's feelings/concerns, and provided emotional support. Patient will work on implementing appropriate self-care habits into their daily routine such as: staying positive, writing a gratitude list, drinking water, staying active around the house, combating negative thoughts or emotions and staying connected.  Patient denies any SI/HI. [facility] LCSW sent patient an email with mental health service resources within her area that accept her insurance on 09/05/21.  LCSW provided education on healthy sleep hygiene and what that looks like. LCSW encouraged patient to implement a night time routine into their schedule that works best for them and that they are able to maintain. Advised patient to implement deep breathing/grounding/meditation/self-care exercises into their nightly routine to combat racing thoughts at night. LCSW encouraged patient to wake up at the same time each day, make their sleeping environment comfortable, exercise when able, to limit naps and to not eat or drink anything right before bed. UPDATE 10/03/21- Patient is wanting to enroll into [facility] mental health program. Referral has been made for both psychiatry and counseling but patient has not received a call yet from agency. [facility] LCSW offered to complete a joint call to agency to get her appointments scheduled but patient prefers to utilize their walk in clinic as their wait list is several months out at this time. [facility] LCSW sent patient an additional email with their walk in hour information. Patient  desired to gain information on how to apply for disability. [facility] LCSW provided education and advised to her to go to her local social security office. [facility] LCSW sent her resources on how to apply for disability through email as well. Patient is agreeable to 30 day follow up next month to ensure that she went to walk in clinic and was established with a long term mental health provider.   Motivational Interviewing employed Depression screen reviewed  PHQ2/ PHQ9 completed Mindfulness or Relaxation training provided Active listening / Reflection utilized  Advance Care and HCPOA education provided Emotional Support Provided Problem [place] strategies reviewed Provided psychoeducation for mental health needs  Provided brief CBT  Reviewed mental health medications and discussed importance of compliance:  Quality of sleep assessed & Sleep Hygiene techniques promoted  Participation in counseling encouraged  Verbalization of feelings encouraged  Suicidal Ideation/Homicidal Ideation assessed: Patient denies SI/HI  Review resources, discussed options and provided patient information about  [place] care team collaboration (see longitudinal plan of care) Patient Goals/Self-Care Activities: Over the next 120 days Attend scheduled medical appointments Utilize healthy coping skills and supportive resources discussed Contact PCP with any questions or concerns Keep 90 percent of counseling appointments Call your insurance provider for more information about your Enhanced Benefits  Check out counseling resources provided  Begin personal counseling with LCSW, to reduce and manage symptoms of Depression and Stress, until well-established with mental health provider Accept all calls from representative with [facility] in an effort to establish ongoing mental health counseling and supportive services. Incorporate into daily practice - relaxation techniques, deep  breathing exercises, and mindfulness meditation strategies. Talk about feelings with friends, family members, spiritual advisor, etc. Contact LCSW directly [phone]), if you have questions, need assistance, or if additional social work needs are identified between now and our next scheduled telephone outreach call. Call 988 for mental health hotline/crisis line if needed (24/7 available) Try techniques to reduce symptoms of anxiety/negative thinking (deep breathing, distraction, positive self talk, etc)  - develop a personal safety plan - develop a plan to deal with triggers like holidays, anniversaries - exercise at least 2 to 3 times per week - have a plan for how to handle bad days - journal feelings and what helps to feel better or worse - spend time or talk with others at least 2 to 3 times per week - watch for early signs of feeling worse - begin personal counseling - call and visit an old friend - check out volunteer opportunities - join a support group - laugh; watch a funny movie or comedian - learn and use visualization or guided imagery - perform a random act of kindness - practice relaxation or meditation daily - start or continue a personal journal - practice positive thinking and self-talk -continue with compliance of taking medication  -identify current effective and ineffective coping strategies.  -implement positive self-talk in care to increase self-esteem, confidence and feelings of control.  -consider alternative and complementary therapy approaches such as meditation, mindfulness or yoga.  -journaling, prayer, worship services, meditation or pastoral counseling.  -increase participation in pleasurable group activities such as hobbies, singing, sports or volunteering).  -consider the use of meditative movement therapy such as tai chi, yoga or qigong.  -start a regular daily exercise program based on tolerance, ability and patient choice to support positive  thinking and activity        24- Hour Availability:    [facility]  [address] [place], [place] Crisis [phone]   [facility] [employer] [place]  [phone]    [place]  [phone] (after hours)   Therapeutic Alternative/Mobile Crisis   [phone]   [place] National Suicide Hotline  (365)782-7714 (TALK) OR 988   Call 911 or go to emergency room   [facility]  [phone]);  [place] and [employer]  [phone]); [place], [employer], [place], [place], [place], [place], [place]        10 LITTLE Things To Do When You're Feeling Too Down To Do Anything  Take a shower. Even if you plan to stay in all day long and not see a soul, take a shower. It takes the most effort to hop in to the shower but once you  do, you'll feel immediate results. It will wake you up and you'll be feeling much fresher (and cleaner too).  Brush and floss your teeth. Give your teeth a good brushing with a floss finish. It's a small task but it feels so good and you can check 'taking care of your health' off the list of things to do.  Do something small on your list. Most of [place] have some small thing we would like to get done (load of laundry, sew a button, email a friend). Doing one of these things will make you feel like you've accomplished something.  Drink water. Drinking water is easy right? It's also really beneficial for your health so keep a glass beside you all day and take sips often. It gives you energy and prevents you from boredom eating.  Do some floor exercises. The last thing you want to do is exercise but it might be just the thing you need the most. Keep it simple and do exercises that involve sitting or laying on the floor. Even the smallest of exercises release chemicals in the brain that make you feel good. Yoga stretches  or core exercises are going to make you feel good with minimal effort.  Make your bed. Making your bed takes a few minutes but it's productive and you'll feel relieved when it's done. An unmade bed is a huge visual reminder that you're having an unproductive day. Do it and consider it your housework for the day.  Put on some nice clothes. Take the sweatpants off even if you don't plan to go anywhere. Put on clothes that make you feel good. Take a look in the mirror so your brain recognizes the sweatpants have been replaced with clothes that make you look great. It's an instant confidence booster.  Wash the dishes. A pile of dirty dishes in the sink is a reflection of your mood. It's possible that if you wash up the dishes, your mood will follow suit. It's worth a try.  Cook a real meal. If you have the luxury to have a "do nothing" day, you have time to make a real meal for yourself. Make a meal that you love to eat. The process is good to get you out of the funk and the food will ensure you have more energy for tomorrow.  Write out your thoughts by hand. When you hand write, you stimulate your brain to focus on the moment that you're in so make yourself comfortable and write whatever comes into your mind. Put those thoughts out on paper so they stop spinning around in your head. Those thoughts might be the very thing holding you down.   Patient Goals: Follow up goal     Follow up:  Patient agrees to Care Plan and Follow-up.  Plan: The Managed Medicaid care management team will reach out to the patient again over the next 30 days.  Date/time of next scheduled Social Work care management/care coordination outreach:  11/02/21 at 1:00 pm.  Eula Fried, BSW, MSW, [place] Medicaid LCSW [email or web address] Phone: [phone]

## 2021-10-03 NOTE — Patient Instructions (Signed)
Visit Information  Ann Moran was given information about Medicaid Managed Care team care coordination services as a part of their [place] Medicaid benefit. [name] verbally consentedto engagement with the [facility] Managed Care team.   If you are experiencing a medical emergency, please call 911 or report to your local emergency department or urgent care.   If you have a non-emergency medical problem during routine business hours, please contact your provider's office and ask to speak with a nurse.   For questions related to your [facility] health plan, please call: [phone]  OR visit the member homepage at: [email or web address]  If you would like to schedule transportation through your [facility] plan, please call the following number at least 2 days in advance of your appointment: [phone]  If you are experiencing a behavioral health crisis, call the [place] at [phone]). The line is available 24 hours a day, seven days a week.  If you would like help to quit smoking, call 1-800-QUIT-NOW 726-639-1254) OR Espaol: 1-855-Djelo-Ya (3-532-992-4268) o para ms informacin haga clic aqu or Text READY to 200-400 to register via text  Following is a copy of your plan of care:  Care Plan : LCSW Plan of Care  Updates made by Greg Cutter, LCSW since 10/03/2021 12:00 AM     Problem: Coping Skills (General Plan of Care)      Long-Range Goal: I need mental health support   Start Date: 09/05/2021  Priority: High  Note:   Priority: High  Timeframe:  Long-Range Goal Priority:  High Start Date:   09/05/21            Expected End Date:  11/02/21 at 1:00 pm                   Follow Up Date--11/02/21 at 1:00 pm.  - check out counseling - keep 90 percent of counseling appointments - schedule counseling  appointment    Why is this important?             Beating depression may take some time.            If you don't feel better right away, don't give up on your treatment plan.    Current barriers:   Chronic Mental Health needs related to depression, insomnia, stress and anxiety. Patient requires Support, Education, Resources, Referrals, Advocacy, and Care Coordination, in order to meet Unmet Mental Health Needs. Patient will implement clinical interventions discussed today to decrease symptoms of depression and increase knowledge and/or ability of: coping skills. Mental Health Concerns and Social Isolation Patient lacks knowledge of available community counseling agencies and resources.  Clinical Goal(s): verbalize understanding of plan for management of Anxiety, Depression, Insomnia and Stress and demonstrate a reduction in symptoms. Patient will connect with a provider for ongoing mental health treatment, increase coping skills, healthy habits, self-management skills, and stress reduction      Inter-disciplinary care team collaboration (see longitudinal plan of care) Patient Goals/Self-Care Activities: Over the next 120 days Attend scheduled medical appointments Utilize healthy coping skills and supportive resources discussed Contact PCP with any questions or concerns Keep 90 percent of counseling appointments Call your insurance provider for more information about your Enhanced Benefits  Check out counseling resources provided  Begin personal counseling with LCSW, to reduce and manage symptoms of Depression and Stress, until well-established with mental health provider Accept all calls from representative with [facility] in  an effort to establish ongoing mental health counseling and supportive services. Incorporate into daily practice - relaxation techniques, deep breathing exercises, and mindfulness meditation strategies. Talk about feelings with friends, family members, spiritual advisor,  etc. Contact LCSW directly [phone]), if you have questions, need assistance, or if additional social work needs are identified between now and our next scheduled telephone outreach call. Call 988 for mental health hotline/crisis line if needed (24/7 available) Try techniques to reduce symptoms of anxiety/negative thinking (deep breathing, distraction, positive self talk, etc)  - develop a personal safety plan - develop a plan to deal with triggers like holidays, anniversaries - exercise at least 2 to 3 times per week - have a plan for how to handle bad days - journal feelings and what helps to feel better or worse - spend time or talk with others at least 2 to 3 times per week - watch for early signs of feeling worse - begin personal counseling - call and visit an old friend - check out volunteer opportunities - join a support group - laugh; watch a funny movie or comedian - learn and use visualization or guided imagery - perform a random act of kindness - practice relaxation or meditation daily - start or continue a personal journal - practice positive thinking and self-talk -continue with compliance of taking medication  -identify current effective and ineffective coping strategies.  -implement positive self-talk in care to increase self-esteem, confidence and feelings of control.  -consider alternative and complementary therapy approaches such as meditation, mindfulness or yoga.  -journaling, prayer, worship services, meditation or pastoral counseling.  -increase participation in pleasurable group activities such as hobbies, singing, sports or volunteering).  -consider the use of meditative movement therapy such as tai chi, yoga or qigong.  -start a regular daily exercise program based on tolerance, ability and patient choice to support positive thinking and activity        24- Hour Availability:    [facility]  [address] [place], [place] Tensed Crisis [phone]   [facility] [employer] [place]  [phone]    [place]  [phone] (after hours)   Therapeutic Alternative/Mobile Crisis   [phone]   [place] National Suicide Hotline  (773)625-6749 (TALK) OR 988   Call 911 or go to emergency room   [facility]  [phone]);  [place] and [employer]  [phone]); [place], [place], [place], [place], [place], [place], [place]        10 LITTLE Things To Do When You're Feeling Too Down To Do Anything  Take a shower. Even if you plan to stay in all day long and not see a soul, take a shower. It takes the most effort to hop in to the shower but once you do, you'll feel immediate results. It will wake you up and you'll be feeling much fresher (and cleaner too).  Brush and floss your teeth. Give your teeth a good brushing with a floss finish. It's a small task but it feels so good and you can check 'taking care of your health' off the list of things to do.  Do something small on your list. Most of [place] have some small thing we would like to get done (load of laundry, sew a button, email a friend). Doing one of these things will make you feel like you've accomplished something.  Drink water. Drinking water is easy  right? It's also really beneficial for your health so keep a glass beside you all day and take sips often. It gives you energy and prevents you from boredom eating.  Do some floor exercises. The last thing you want to do is exercise but it might be just the thing you need the most. Keep it simple and do exercises that involve sitting or laying on the floor. Even the smallest of exercises release chemicals in the brain that make you feel good. Yoga stretches or core exercises are going to make you feel good with minimal effort.  Make your bed. Making your bed takes a few minutes but it's  productive and you'll feel relieved when it's done. An unmade bed is a huge visual reminder that you're having an unproductive day. Do it and consider it your housework for the day.  Put on some nice clothes. Take the sweatpants off even if you don't plan to go anywhere. Put on clothes that make you feel good. Take a look in the mirror so your brain recognizes the sweatpants have been replaced with clothes that make you look great. It's an instant confidence booster.  Wash the dishes. A pile of dirty dishes in the sink is a reflection of your mood. It's possible that if you wash up the dishes, your mood will follow suit. It's worth a try.  Cook a real meal. If you have the luxury to have a "do nothing" day, you have time to make a real meal for yourself. Make a meal that you love to eat. The process is good to get you out of the funk and the food will ensure you have more energy for tomorrow.  Write out your thoughts by hand. When you hand write, you stimulate your brain to focus on the moment that you're in so make yourself comfortable and write whatever comes into your mind. Put those thoughts out on paper so they stop spinning around in your head. Those thoughts might be the very thing holding you down.   Patient Goals: Follow up goal  Eula Fried, BSW, MSW, [employer] Managed Medicaid LCSW [email or web address] Phone: [phone]

## 2021-10-10 ENCOUNTER — Other Ambulatory Visit: Payer: Self-pay | Admitting: Family Medicine

## 2021-10-10 DIAGNOSIS — F419 Anxiety disorder, unspecified: Secondary | ICD-10-CM

## 2021-10-11 ENCOUNTER — Other Ambulatory Visit (HOSPITAL_COMMUNITY): Payer: Self-pay

## 2021-10-11 MED ORDER — HYDROXYZINE HCL 25 MG PO TABS
25.0000 mg | ORAL_TABLET | Freq: Three times a day (TID) | ORAL | 0 refills | Status: DC | PRN
  Filled 2021-10-11: qty 60, 20d supply, fill #0

## 2021-10-12 ENCOUNTER — Encounter: Payer: Self-pay | Admitting: Internal Medicine

## 2021-10-12 ENCOUNTER — Ambulatory Visit (INDEPENDENT_AMBULATORY_CARE_PROVIDER_SITE_OTHER): Payer: Medicaid Other | Admitting: Internal Medicine

## 2021-10-12 VITALS — BP 130/80 | HR 98 | Ht 69.0 in | Wt 305.0 lb

## 2021-10-12 DIAGNOSIS — R112 Nausea with vomiting, unspecified: Secondary | ICD-10-CM | POA: Diagnosis not present

## 2021-10-12 DIAGNOSIS — R1319 Other dysphagia: Secondary | ICD-10-CM | POA: Diagnosis not present

## 2021-10-12 DIAGNOSIS — R12 Heartburn: Secondary | ICD-10-CM | POA: Diagnosis not present

## 2021-10-12 DIAGNOSIS — Z800 Family history of malignant neoplasm of digestive organs: Secondary | ICD-10-CM | POA: Diagnosis not present

## 2021-10-12 NOTE — Patient Instructions (Addendum)
You have been scheduled for an endoscopy. Please follow written instructions given to you at your visit today. If you use inhalers (even only as needed), please bring them with you on the day of your procedure.  You will be contacted by [facility] Scheduling in the next 2 days to arrange a Barium Swallow with tablet.  The number on your caller ID will be [phone], please answer when they call.  If you have not heard from them in 2 days please call [phone] to schedule.    You have been scheduled for a Barium Esophogram at [facility] (1st floor of the hospital) on ____________ at __________________. Please arrive 15 minutes prior to your appointment for registration. Make certain not to have anything to eat or drink 3 hours prior to your test. If you need to reschedule for any reason, please contact radiology at [phone] to do so. __________________________________________________________________ A barium swallow is an examination that concentrates on views of the esophagus. This tends to be a double contrast exam (barium and two liquids which, when combined, create a gas to distend the wall of the oesophagus) or single contrast (non-ionic iodine based). The study is usually tailored to your symptoms so a good history is essential. Attention is paid during the study to the form, structure and configuration of the esophagus, looking for functional disorders (such as aspiration, dysphagia, achalasia, motility and reflux) EXAMINATION You may be asked to change into a gown, depending on the type of swallow being performed. A radiologist and radiographer will perform the procedure. The radiologist will advise you of the type of contrast selected for your procedure and direct you during the exam. You will be asked to stand, sit or lie in several different positions and to hold a small amount of fluid in your mouth before being asked to swallow while the imaging is performed .In  some instances you may be asked to swallow barium coated marshmallows to assess the motility of a solid food bolus. The exam can be recorded as a digital or video fluoroscopy procedure. POST PROCEDURE It will take 1-2 days for the barium to pass through your system. To facilitate this, it is important, unless otherwise directed, to increase your fluids for the next 24-48hrs and to resume your normal diet.  This test typically takes about 30 minutes to perform. __________________________________________________________________________________    I appreciate the opportunity to care for you. Stan Head, MD, [facility]

## 2021-10-12 NOTE — Progress Notes (Signed)
Ann Moran 35 y.o. 1985-11-17 [id number]  Assessment & Plan:   Encounter Diagnoses  Name Primary?   Esophageal dysphagia Yes   Nausea and vomiting, unspecified vomiting type    Heartburn    Family history of colon cancer - second degree relatives     GERD diet  EGD and consider repeat dilation but get a barium swallow looking for dysmotility problems and checking on 13 mm tablet ingestion as well prior.  Consider the role of daily marijuana and some of her symptoms.  She brought up that her father think she should have a colonoscopy.  Her father has some brothers or sisters that have had colon cancer and perhaps his father but he has never had colon cancer and he is 12.  I think she can wait till 72.  Risks benefits and indications of the procedure reviewed she has reviewed our informed consent form that she will sign.  CC: Hoy Register, MD  Subjective:   Chief Complaint: Dysphagia and vomiting  HPI 36 year old African-American woman presents with a recurrence of pill and sometimes food dysphagia as well as delayed vomiting of partially digested food.  Similar problems in 2021 where an EGD was unrevealing I did a Maloney dilation to 17 [place] and also scheduled a gastric emptying study.  This was normal at 3 hours and the study was terminated.  She reports having some benefit with the dilation but problems with mainly pills hanging and sometimes food have returned.  She will also find that later in the day she will regurgitate/vomit partially digested food preceded by episodes of nausea.  If she eats late and retires to bed she will have reflux episodes.  She smokes marijuana daily to help with migraines.  Was using edibles but switched to smoking because she thought the edibles might have been making migraines worse.  She is on omeprazole 40 mg daily.  She has found that spicy and fried foods bother her so she has reduced/eliminated.  She recently broke up with a "toxic  boyfriend" per video visit note she says that is going okay and she was prescribed buspirone and sertraline and is on trazodone as well.  Some of these were chronic I believe.  That was in late May.  She also had a video visit for mouth ulcers.  In the ED with diarrhea of presumed infectious origin, she had taken antibiotics and developed diarrhea and also ate a Chipotle.  She took Imodium per healthcare provider advice and then became constipated and went to the ER because she was concerned about changes in black stool but was taking Pepto-Bismol.  Wt Readings from Last 3 Encounters:  10/12/21 (!) 305 lb (138.3 kg)  08/28/21 300 lb (136.1 kg)  07/17/21 300 lb (136.1 kg)     Lab Results  Component Value Date   HGBA1C 5.7 (H) 02/23/2021   Lab Results  Component Value Date   WBC 7.7 04/05/2021   HGB 13.6 04/05/2021   HCT 39.0 04/05/2021   MCV 86 04/05/2021   PLT 354 04/05/2021  EGD 2021 - Normal esophagus. - Normal stomach. - Normal examined duodenum. - Dilation performed in the entire esophagus.-51 [place] - No specimens collected. Solid-phase gastric emptying study 2021 FINDINGS: Expected location of the stomach in the left upper quadrant. Ingested meal empties the stomach gradually over the course of the study.   60% emptied at 1 hr ( normal >= 10%)   81% emptied at 2 hr ( normal >= 40%)  99% emptied at 3 hr ( normal >= 70%)   IMPRESSION: Normal gastric emptying study.  No Known Allergies Current Meds  Medication Sig   albuterol (VENTOLIN HFA) 108 (90 Base) MCG/ACT inhaler Inhale 2 puffs into the lungs every 6 (six) hours as needed for wheezing or shortness of breath (Cough).   amLODipine (NORVASC) 10 MG tablet TAKE 1 TABLET(10 MG) BY MOUTH DAILY   Blood Pressure Monitoring (BLOOD PRESSURE CUFF) MISC Use to check blood pressure once daily.   busPIRone (BUSPAR) 15 MG tablet Take 1 tablet (15 mg total) by mouth 2 (two) times daily.   cetirizine (ZYRTEC ALLERGY) 10 MG  tablet Take 1 tablet (10 mg total) by mouth at bedtime.   diclofenac (VOLTAREN) 75 MG EC tablet TAKE 1 TABLET(75 MG) BY MOUTH TWICE DAILY   fluticasone (FLONASE) 50 MCG/ACT nasal spray Place 2 sprays into both nostrils daily.   hydrOXYzine (ATARAX) 25 MG tablet Take 1 tablet (25 mg total) by mouth every 8 (eight) hours as needed for anxiety.   ipratropium (ATROVENT) 0.06 % nasal spray Place 2 sprays into both nostrils 4 (four) times daily. As needed for nasal congestion, runny nose   losartan (COZAAR) 100 MG tablet TAKE 1 TABLET(100 MG) BY MOUTH DAILY   nicotine (NICODERM CQ - DOSED IN MG/24 HOURS) 21 mg/24hr patch Place 1 patch (21 mg total) onto the skin daily.   omeprazole (PRILOSEC) 40 MG capsule Take 1 capsule (40 mg total) by mouth daily.   traZODone (DESYREL) 50 MG tablet Take 0.5-1 tablets (25-50 mg total) by mouth at bedtime as needed for sleep.   Past Medical History:  Diagnosis Date   Allergy    Anemia    Anxiety    Chlamydia    Depression    hx of meds, none currently- doing ok   Diabetes mellitus without complication (HCC)    GERD (gastroesophageal reflux disease)    Infection due to trichomonas    MRSA (methicillin resistant staph aureus) culture positive    Dec 2012   Obesity    Pregnancy induced hypertension    Urinary tract infection    Past Surgical History:  Procedure Laterality Date   CHOLECYSTECTOMY     ECTOPIC PREGNANCY SURGERY     ESOPHAGOGASTRODUODENOSCOPY  05/2019   Social History  Single, marijuana use daily, cigarette smoker daily, drinks some alcohol also.  Not currently sexually active.  family history includes Asthma in her father; Colon cancer in her paternal aunt and paternal grandmother; Diabetes in her mother and paternal grandmother; Hypertension in her father, mother, and paternal grandmother; Stroke in her maternal grandmother.   Review of Systems As per HPI  Objective:   Physical Exam BP 130/80   Pulse 98   Ht 5\' 9"  (1.753 m)   Wt  (!) 305 lb (138.3 kg)   BMI 45.04 kg/m    Obese NAD Lungs cta Ht S1S2 Abd obese soft NT no splash

## 2021-10-13 ENCOUNTER — Other Ambulatory Visit: Payer: Self-pay | Admitting: Pharmacist

## 2021-10-13 ENCOUNTER — Other Ambulatory Visit (HOSPITAL_COMMUNITY): Payer: Self-pay

## 2021-10-13 DIAGNOSIS — F17200 Nicotine dependence, unspecified, uncomplicated: Secondary | ICD-10-CM

## 2021-10-13 MED ORDER — NICOTINE POLACRILEX 4 MG MT LOZG: 4.0000 mg | LOZENGE | OROMUCOSAL | 3 refills | Status: DC | PRN

## 2021-10-17 ENCOUNTER — Encounter (HOSPITAL_COMMUNITY): Payer: Self-pay | Admitting: Pharmacist

## 2021-10-17 ENCOUNTER — Other Ambulatory Visit (HOSPITAL_COMMUNITY): Payer: Self-pay

## 2021-10-21 ENCOUNTER — Other Ambulatory Visit (HOSPITAL_COMMUNITY): Payer: Self-pay

## 2021-10-30 ENCOUNTER — Ambulatory Visit: Payer: Medicaid Other | Admitting: Family Medicine

## 2021-11-01 ENCOUNTER — Telehealth: Payer: Medicaid Other | Admitting: Physician Assistant

## 2021-11-01 DIAGNOSIS — J208 Acute bronchitis due to other specified organisms: Secondary | ICD-10-CM

## 2021-11-01 MED ORDER — PROMETHAZINE-DM 6.25-15 MG/5ML PO SYRP
5.0000 mL | ORAL_SOLUTION | Freq: Four times a day (QID) | ORAL | 0 refills | Status: DC | PRN
Start: 2021-11-01 — End: 2022-01-03

## 2021-11-01 MED ORDER — PREDNISONE 10 MG (21) PO TBPK: ORAL_TABLET | ORAL | 0 refills | Status: DC

## 2021-11-01 MED ORDER — BENZONATATE 100 MG PO CAPS: 100.0000 mg | ORAL_CAPSULE | Freq: Three times a day (TID) | ORAL | 0 refills | Status: DC | PRN

## 2021-11-01 NOTE — Progress Notes (Signed)
We are sorry that you are not feeling well.  Here is how we plan to help! ? ?Based on your presentation I believe you most likely have A cough due to a virus.  This is called viral bronchitis and is best treated by rest, plenty of fluids and control of the cough.  You may use Ibuprofen or Tylenol as directed to help your symptoms.   ?  ?In addition you may use A non-prescription cough medication called Mucinex DM: take 2 tablets every 12 hours. and A prescription cough medication called Tessalon Perles 100mg. You may take 1-2 capsules every 8 hours as needed for your cough. ? ?Prednisone 10 mg daily for 6 days (see taper instructions below) ? ?Directions for 6 day taper: ?Day 1: 2 tablets before breakfast, 1 after both lunch & dinner and 2 at bedtime ?Day 2: 1 tab before breakfast, 1 after both lunch & dinner and 2 at bedtime ?Day 3: 1 tab at each meal & 1 at bedtime ?Day 4: 1 tab at breakfast, 1 at lunch, 1 at bedtime ?Day 5: 1 tab at breakfast & 1 tab at bedtime ?Day 6: 1 tab at breakfast ? ?From your responses in the eVisit questionnaire you describe inflammation in the upper respiratory tract which is causing a significant cough.  This is commonly called Bronchitis and has four common causes:   ?Allergies ?Viral Infections ?Acid Reflux ?Bacterial Infection ?Allergies, viruses and acid reflux are treated by controlling symptoms or eliminating the cause. An example might be a cough caused by taking certain blood pressure medications. You stop the cough by changing the medication. Another example might be a cough caused by acid reflux. Controlling the reflux helps control the cough. ? ?USE OF BRONCHODILATOR ("RESCUE") INHALERS: ?There is a risk from using your bronchodilator too frequently.  The risk is that over-reliance on a medication which only relaxes the muscles surrounding the breathing tubes can reduce the effectiveness of medications prescribed to reduce swelling and congestion of the tubes themselves.   Although you feel brief relief from the bronchodilator inhaler, your asthma may actually be worsening with the tubes becoming more swollen and filled with mucus.  This can delay other crucial treatments, such as oral steroid medications. If you need to use a bronchodilator inhaler daily, several times per day, you should discuss this with your provider.  There are probably better treatments that could be used to keep your asthma under control.  ?   ?HOME CARE ?Only take medications as instructed by your medical team. ?Complete the entire course of an antibiotic. ?Drink plenty of fluids and get plenty of rest. ?Avoid close contacts especially the very young and the elderly ?Cover your mouth if you cough or cough into your sleeve. ?Always remember to wash your hands ?A steam or ultrasonic humidifier can help congestion.  ? ?GET HELP RIGHT AWAY IF: ?You develop worsening fever. ?You become short of breath ?You cough up blood. ?Your symptoms persist after you have completed your treatment plan ?MAKE SURE YOU  ?Understand these instructions. ?Will watch your condition. ?Will get help right away if you are not doing well or get worse. ?  ? ?Thank you for choosing an e-visit. ? ?Your e-visit answers were reviewed by a board certified advanced clinical practitioner to complete your personal care plan. Depending upon the condition, your plan could have included both over the counter or prescription medications. ? ?Please review your pharmacy choice. Make sure the pharmacy is open so you can   pick up prescription now. If there is a problem, you may contact your provider through MyChart messaging and have the prescription routed to another pharmacy.  Your safety is important to us. If you have drug allergies check your prescription carefully.  ? ?For the next 24 hours you can use MyChart to ask questions about today's visit, request a non-urgent call back, or ask for a work or school excuse. ?You will get an email in the next  two days asking about your experience. I hope that your e-visit has been valuable and will speed your recovery. ? ?I provided 5 minutes of non face-to-face time during this encounter for chart review and documentation.  ? ?

## 2021-11-01 NOTE — Progress Notes (Signed)
Virtual Visit Consent   [name], you are scheduled for a virtual visit with a [name] provider today. Just as with appointments in the office, your consent must be obtained to participate. Your consent will be active for this visit and any virtual visit you may have with one of our providers in the next 365 days. If you have a MyChart account, a copy of this consent can be sent to you electronically.  As this is a virtual visit, video technology does not allow for your provider to perform a traditional examination. This may limit your provider's ability to fully assess your condition. If your provider identifies any concerns that need to be evaluated in person or the need to arrange testing (such as labs, EKG, etc.), we will make arrangements to do so. Although advances in technology are sophisticated, we cannot ensure that it will always work on either your end or our end. If the connection with a video visit is poor, the visit may have to be switched to a telephone visit. With either a video or telephone visit, we are not always able to ensure that we have a secure connection.  By engaging in this virtual visit, you consent to the provision of healthcare and authorize for your insurance to be billed (if applicable) for the services provided during this visit. Depending on your insurance coverage, you may receive a charge related to this service.  I need to obtain your verbal consent now. Are you willing to proceed with your visit today? [name] has provided verbal consent on 11/01/2021 for a virtual visit (video or telephone). [name], PA-C  Date: 11/01/2021 3:04 PM  Virtual Visit via Video Note   I, [name], connected with  Ann Moran  ([id number], 1985-05-08) on 11/01/21 at  3:00 PM EDT by a video-enabled telemedicine application and verified that I am speaking with the correct person using two identifiers.  Location: Patient: Virtual Visit Location  Patient: Home Provider: Virtual Visit Location Provider: Home Office   I discussed the limitations of evaluation and management by telemedicine and the availability of in person appointments. The patient expressed understanding and agreed to proceed.    History of Present Illness: Ann Moran is a 36 y.o. who identifies as a female who was assigned female at birth, and is being seen today for cough.  HPI: Cough This is a new problem. The current episode started in the past 7 days (4 days). The problem has been gradually worsening. The problem occurs constantly. The cough is Non-productive.    Did an E-visit earlier and was prescribed tessalon perles and prednisone. Is reaching back out because she is unable to take prednisone. Reports she was given it a couple of years ago and had swelling of the neck and shoulders. She reports she has had chronic swelling of the neck and shoulders since taking it and does not want to risk that worsening with taking it. She is curious what else could be added for her cough.   Problems:  Patient Active Problem List   Diagnosis Date Noted   Smoking 02/23/2021   Patellar tendinitis of both knees 01/27/2021   Pain in joint involving right ankle and foot 01/27/2021   Thoracic back pain 12/23/2020   Right elbow pain 12/23/2020   Chronic pain of right knee 12/23/2020   Ankle fracture, left 08/09/2020   Dysphagia 05/20/2019   Encounter for other preprocedural examination 05/20/2019   PCOS (polycystic  ovarian syndrome) 02/24/2019   Neck pain 02/24/2019   Morbid obesity with body mass index (BMI) of 45.0 to 49.9 in adult [facility]) 02/24/2019   Essential hypertension 02/24/2019   Gastroesophageal reflux disease 02/24/2019   Irregular menstrual cycle 01/14/2019   Abnormal uterine bleeding 01/14/2019   Chronic cough 01/14/2019    Allergies:  Allergies  Allergen Reactions   Prednisone Swelling    Had neck and shoulder swelling following taking    Medications:  Current Outpatient Medications:    promethazine-dextromethorphan (PROMETHAZINE-DM) 6.25-15 MG/5ML syrup, Take 5-10 mLs by mouth 4 (four) times daily as needed., Disp: 118 mL, Rfl: 0   albuterol (VENTOLIN HFA) 108 (90 Base) MCG/ACT inhaler, Inhale 2 puffs into the lungs every 6 (six) hours as needed for wheezing or shortness of breath (Cough)., Disp: 6 each, Rfl: 1   amLODipine (NORVASC) 10 MG tablet, TAKE 1 TABLET(10 MG) BY MOUTH DAILY, Disp: 90 tablet, Rfl: 0   benzonatate (TESSALON) 100 MG capsule, Take 1 capsule (100 mg total) by mouth 3 (three) times daily as needed., Disp: 30 capsule, Rfl: 0   Blood Pressure Monitoring (BLOOD PRESSURE CUFF) MISC, Use to check blood pressure once daily., Disp: 1 each, Rfl: 0   busPIRone (BUSPAR) 15 MG tablet, Take 1 tablet (15 mg total) by mouth 2 (two) times daily., Disp: 60 tablet, Rfl: 2   cetirizine (ZYRTEC ALLERGY) 10 MG tablet, Take 1 tablet (10 mg total) by mouth at bedtime., Disp: 90 tablet, Rfl: 1   diclofenac (VOLTAREN) 75 MG EC tablet, TAKE 1 TABLET(75 MG) BY MOUTH TWICE DAILY, Disp: 60 tablet, Rfl: 1   fluticasone (FLONASE) 50 MCG/ACT nasal spray, Place 2 sprays into both nostrils daily., Disp: 48 mL, Rfl: 1   hydrOXYzine (ATARAX) 25 MG tablet, Take 1 tablet (25 mg total) by mouth every 8 (eight) hours as needed for anxiety., Disp: 60 tablet, Rfl: 0   ipratropium (ATROVENT) 0.06 % nasal spray, Place 2 sprays into both nostrils 4 (four) times daily. As needed for nasal congestion, runny nose, Disp: 15 mL, Rfl: 2   losartan (COZAAR) 100 MG tablet, TAKE 1 TABLET(100 MG) BY MOUTH DAILY, Disp: 90 tablet, Rfl: 0   omeprazole (PRILOSEC) 40 MG capsule, Take 1 capsule (40 mg total) by mouth daily., Disp: 30 capsule, Rfl: 0   traZODone (DESYREL) 50 MG tablet, Take 0.5-1 tablets (25-50 mg total) by mouth at bedtime as needed for sleep., Disp: 30 tablet, Rfl: 3  Observations/Objective: Patient is well-developed, well-nourished in no acute  distress.  Resting comfortably at home.  Head is normocephalic, atraumatic.  No labored breathing.  Speech is clear and coherent with logical content.  Patient is alert and oriented at baseline.  No coughing heard through VV  Assessment and Plan: 1. Acute viral bronchitis - promethazine-dextromethorphan (PROMETHAZINE-DM) 6.25-15 MG/5ML syrup; Take 5-10 mLs by mouth 4 (four) times daily as needed.  Dispense: 118 mL; Refill: 0  - Continue treatment as directed by E-visit earlier - Promethazine DM added - Advised if symptoms worsen at anytime or fail to improve over the next 48 hours she can reach back out for re-evaluation or be seen in person  Follow Up Instructions: I discussed the assessment and treatment plan with the patient. The patient was provided an opportunity to ask questions and all were answered. The patient agreed with the plan and demonstrated an understanding of the instructions.  A copy of instructions were sent to the patient via MyChart unless otherwise noted below.    The  patient was advised to call back or seek an in-person evaluation if the symptoms worsen or if the condition fails to improve as anticipated.  Time:  I spent 8 minutes with the patient via telehealth technology discussing the above problems/concerns.    [name], PA-C

## 2021-11-01 NOTE — Patient Instructions (Signed)
Ann Moran, thank you for joining Ann Loveless, PA-C for today's virtual visit.  While this provider is not your primary care provider (PCP), if your PCP is located in our provider database this encounter information will be shared with them immediately following your visit.  Consent: (Patient) Ann Moran provided verbal consent for this virtual visit at the beginning of the encounter.  Current Medications:  Current Outpatient Medications:    promethazine-dextromethorphan (PROMETHAZINE-DM) 6.25-15 MG/5ML syrup, Take 5-10 mLs by mouth 4 (four) times daily as needed., Disp: 118 mL, Rfl: 0   albuterol (VENTOLIN HFA) 108 (90 Base) MCG/ACT inhaler, Inhale 2 puffs into the lungs every 6 (six) hours as needed for wheezing or shortness of breath (Cough)., Disp: 6 each, Rfl: 1   amLODipine (NORVASC) 10 MG tablet, TAKE 1 TABLET(10 MG) BY MOUTH DAILY, Disp: 90 tablet, Rfl: 0   benzonatate (TESSALON) 100 MG capsule, Take 1 capsule (100 mg total) by mouth 3 (three) times daily as needed., Disp: 30 capsule, Rfl: 0   Blood Pressure Monitoring (BLOOD PRESSURE CUFF) MISC, Use to check blood pressure once daily., Disp: 1 each, Rfl: 0   busPIRone (BUSPAR) 15 MG tablet, Take 1 tablet (15 mg total) by mouth 2 (two) times daily., Disp: 60 tablet, Rfl: 2   cetirizine (ZYRTEC ALLERGY) 10 MG tablet, Take 1 tablet (10 mg total) by mouth at bedtime., Disp: 90 tablet, Rfl: 1   diclofenac (VOLTAREN) 75 MG EC tablet, TAKE 1 TABLET(75 MG) BY MOUTH TWICE DAILY, Disp: 60 tablet, Rfl: 1   fluticasone (FLONASE) 50 MCG/ACT nasal spray, Place 2 sprays into both nostrils daily., Disp: 48 mL, Rfl: 1   hydrOXYzine (ATARAX) 25 MG tablet, Take 1 tablet (25 mg total) by mouth every 8 (eight) hours as needed for anxiety., Disp: 60 tablet, Rfl: 0   ipratropium (ATROVENT) 0.06 % nasal spray, Place 2 sprays into both nostrils 4 (four) times daily. As needed for nasal congestion, runny nose, Disp: 15 mL, Rfl: 2   losartan  (COZAAR) 100 MG tablet, TAKE 1 TABLET(100 MG) BY MOUTH DAILY, Disp: 90 tablet, Rfl: 0   omeprazole (PRILOSEC) 40 MG capsule, Take 1 capsule (40 mg total) by mouth daily., Disp: 30 capsule, Rfl: 0   traZODone (DESYREL) 50 MG tablet, Take 0.5-1 tablets (25-50 mg total) by mouth at bedtime as needed for sleep., Disp: 30 tablet, Rfl: 3   Medications ordered in this encounter:  Meds ordered this encounter  Medications   promethazine-dextromethorphan (PROMETHAZINE-DM) 6.25-15 MG/5ML syrup    Sig: Take 5-10 mLs by mouth 4 (four) times daily as needed.    Dispense:  118 mL    Refill:  0    Order Specific Question:   Supervising Provider    Answer:   Hyacinth Meeker, BRIAN [[id number]]     *If you need refills on other medications prior to your next appointment, please contact your pharmacy*  Follow-Up: Call back or seek an in-person evaluation if the symptoms worsen or if the condition fails to improve as anticipated.  Other Instructions Acute Bronchitis, Adult  Acute bronchitis is when air tubes in the lungs (bronchi) suddenly get swollen. The condition can make it hard for you to breathe. In adults, acute bronchitis usually goes away within 2 weeks. A cough caused by bronchitis may last up to 3 weeks. Smoking, allergies, and asthma can make the condition worse. What are the causes? Germs that cause cold and flu (viruses). The most common cause of this condition is the  virus that causes the common cold. Bacteria. Substances that bother (irritate) the lungs, including: Smoke from cigarettes and other types of tobacco. Dust and pollen. Fumes from chemicals, gases, or burned fuel. Indoor or outdoor air pollution. What increases the risk? A weak body's defense system. This is also called the immune system. Any condition that affects your lungs and breathing, such as asthma. What are the signs or symptoms? A cough. Coughing up clear, yellow, or green mucus. Making high-pitched whistling sounds when you  breathe, most often when you breathe out (wheezing). Runny or stuffy nose. Having too much mucus in your lungs (chest congestion). Shortness of breath. Body aches. A sore throat. How is this treated? Acute bronchitis may go away over time without treatment. Your doctor may tell you to: Drink more fluids. This will help thin your mucus so it is easier to cough up. Use a device that gets medicine into your lungs (inhaler). Use a vaporizer or a humidifier. These are machines that add water to the air. This helps with coughing and poor breathing. Take a medicine that thins mucus and helps clear it from your lungs. Take a medicine that prevents or stops coughing. It is not common to take an antibiotic medicine for this condition. Follow these instructions at home:  Take over-the-counter and prescription medicines only as told by your doctor. Use an inhaler, vaporizer, or humidifier as told by your doctor. Take two teaspoons (10 mL) of honey at bedtime. This helps lessen your coughing at night. Drink enough fluid to keep your pee (urine) pale yellow. Do not smoke or use any products that contain nicotine or tobacco. If you need help quitting, ask your doctor. Get a lot of rest. Return to your normal activities when your doctor says that it is safe. Keep all follow-up visits. How is this prevented?  Wash your hands often with soap and water for at least 20 seconds. If you cannot use soap and water, use hand sanitizer. Avoid contact with people who have cold symptoms. Try not to touch your mouth, nose, or eyes with your hands. Avoid breathing in smoke or chemical fumes. Make sure to get the flu shot every year. Contact a doctor if: Your symptoms do not get better in 2 weeks. You have trouble coughing up the mucus. Your cough keeps you awake at night. You have a fever. Get help right away if: You cough up blood. You have chest pain. You have very bad shortness of breath. You faint or  keep feeling like you are going to faint. You have a very bad headache. Your fever or chills get worse. These symptoms may be an emergency. Get help right away. Call your local emergency services (911 in the [place] Do not wait to see if the symptoms will go away. Do not drive yourself to the hospital. Summary Acute bronchitis is when air tubes in the lungs (bronchi) suddenly get swollen. In adults, acute bronchitis usually goes away within 2 weeks. Drink more fluids. This will help thin your mucus so it is easier to cough up. Take over-the-counter and prescription medicines only as told by your doctor. Contact a doctor if your symptoms do not improve after 2 weeks of treatment. This information is not intended to replace advice given to you by your health care provider. Make sure you discuss any questions you have with your health care provider. Document Revised: 08/10/2020 Document Reviewed: 08/10/2020 Elsevier Patient Education  2023 [employer].    If you have  been instructed to have an in-person evaluation today at a local Urgent Care facility, please use the link below. It will take you to a list of all of our available [facility], including address, phone number and hours of operation. Please do not delay care.  [facility]  If you or a family member do not have a primary care provider, use the link below to schedule a visit and establish care. When you choose a [facility] primary care physician or advanced practice provider, you gain a long-term partner in health. Find a Primary Care Provider  Learn more about 's in-office and virtual care options: [facility]

## 2021-11-02 ENCOUNTER — Other Ambulatory Visit: Payer: Self-pay | Admitting: Licensed Clinical Social Worker

## 2021-11-02 NOTE — Patient Outreach (Signed)
Medicaid Managed Care Social Work Note  11/02/2021 Name:  Ann Moran MRN:  [id number] DOB:  08-14-1985  Ann Moran is an 36 y.o. year old female who is a primary Moran of Ann Rakes, MD.  The Medicaid Managed Care Coordination team was consulted for assistance with:  [place] and Resources  Ann Moran was given information about Medicaid Managed Care Coordination team services today. Ann Moran agreed to services and verbal consent obtained.  Engaged with Moran  for by telephone forfollow up visit in response to referral for case management and/or care coordination services.   Assessments/Interventions:  Review of past medical history, allergies, medications, health status, including review of consultants reports, laboratory and other test data, was performed as part of comprehensive evaluation and provision of chronic care management services.  SDOH: (Social Determinant of Health) assessments and interventions performed: SDOH Interventions    Flowsheet Row Most Recent Value  SDOH Interventions   Stress Interventions Offered [employer], Provide Counseling       Advanced Directives Status:  See Care Plan for related entries.  Care Plan                 Allergies  Allergen Reactions   Prednisone Swelling    Had neck and shoulder swelling following taking    Medications Reviewed Today     Reviewed by Greg Cutter, LCSW (Social Worker) on 11/02/21 at 5  Med List Status: <None>   Medication Order Taking? Sig Documenting Provider Last Dose Status Informant  albuterol (VENTOLIN HFA) 108 (90 Base) MCG/ACT inhaler [id number] No Inhale 2 puffs into the lungs every 6 (six) hours as needed for wheezing or shortness of breath (Cough). Ann Oxford Scales, PA-C Taking Active   amLODipine (NORVASC) 10 MG tablet [id number] No TAKE 1 TABLET(10 MG) BY MOUTH DAILY Ann Rakes, MD Taking Active   benzonatate (TESSALON) 100  MG capsule [id number]  Take 1 capsule (100 mg total) by mouth 3 (three) times daily as needed. Mar Daring, PA-C  Active   Blood Pressure Monitoring (BLOOD PRESSURE CUFF) MISC [id number] No Use to check blood pressure once daily. Ann Rakes, MD Taking Active   busPIRone (BUSPAR) 15 MG tablet [id number] No Take 1 tablet (15 mg total) by mouth 2 (two) times daily. Freeman Caldron M, PA-C Taking Active   cetirizine (ZYRTEC ALLERGY) 10 MG tablet [id number] No Take 1 tablet (10 mg total) by mouth at bedtime. Ann Oxford Scales, PA-C Taking Active   diclofenac (VOLTAREN) 75 MG EC tablet [id number] No TAKE 1 TABLET(75 MG) BY MOUTH TWICE DAILY [name], [name], MD Taking Active   fluticasone (FLONASE) 50 MCG/ACT nasal spray [id number] No Place 2 sprays into both nostrils daily. Ann Oxford Scales, PA-C Taking Active   hydrOXYzine (ATARAX) 25 MG tablet [id number] No Take 1 tablet (25 mg total) by mouth every 8 (eight) hours as needed for anxiety. Ann Rakes, MD Taking Active   ipratropium (ATROVENT) 0.06 % nasal spray [id number] No Place 2 sprays into both nostrils 4 (four) times daily. As needed for nasal congestion, runny nose Ann Oxford Scales, PA-C Taking Active   losartan (COZAAR) 100 MG tablet [id number] No TAKE 1 TABLET(100 MG) BY MOUTH DAILY Ann Rakes, MD Taking Active   Moran not taking:  Discontinued 12/06/18 0950 omeprazole (PRILOSEC) 40 MG capsule [id number] No Take 1 capsule (40 mg total) by mouth daily. [name], MD Taking Active   promethazine-dextromethorphan (PROMETHAZINE-DM) 6.25-15 MG/5ML syrup [id number]  Take 5-10 mLs by mouth 4 (four) times daily as needed. [name] M, PA-C  Active   traZODone (DESYREL) 50 MG tablet [id number] No Take 0.5-1 tablets (25-50 mg total) by mouth at bedtime as needed for sleep. Ann Moran, [place] Taking Active             Moran Active Problem List   Diagnosis Date Noted   Smoking 02/23/2021    Patellar tendinitis of both knees 01/27/2021   Pain in joint involving right ankle and foot 01/27/2021   Thoracic back pain 12/23/2020   Right elbow pain 12/23/2020   Chronic pain of right knee 12/23/2020   Ankle fracture, left 08/09/2020   Dysphagia 05/20/2019   Encounter for other preprocedural examination 05/20/2019   PCOS (polycystic ovarian syndrome) 02/24/2019   Neck pain 02/24/2019   Morbid obesity with body mass index (BMI) of 45.0 to 49.9 in adult [facility]) 02/24/2019   Essential hypertension 02/24/2019   Gastroesophageal reflux disease 02/24/2019   Irregular menstrual cycle 01/14/2019   Abnormal uterine bleeding 01/14/2019   Chronic cough 01/14/2019    Conditions to be addressed/monitored per PCP order:  Anxiety and Depression  Care Plan : LCSW Plan of Care  Updates made by Greg Cutter, LCSW since 11/02/2021 12:00 AM     Problem: Coping Skills (General Plan of Care)      Long-Range Goal: I need mental health support   Start Date: 09/05/2021  Priority: High  Note:   Priority: High  Timeframe:  Long-Range Goal Priority:  High Start Date:   09/05/21            Expected End Date:  11/16/21                 Follow Up Date--11/16/21 at 2:30 pm.   - check out counseling - keep 90 percent of counseling appointments - schedule counseling appointment    Why is this important?             Beating depression may take some time.            If you don't feel better right away, don't give up on your treatment plan.    Current barriers:   Chronic Mental Health needs related to depression, insomnia, stress and anxiety. Moran requires Support, Education, Resources, Referrals, Advocacy, and Care Coordination, in order to meet Unmet Mental Health Needs. Moran will implement clinical interventions discussed today to decrease symptoms of depression and increase knowledge and/or ability of: coping skills. Mental Health Concerns and Social Isolation Moran lacks knowledge of  available community counseling agencies and resources.  Clinical Goal(s): verbalize understanding of plan for management of Anxiety, Depression, Insomnia and Stress and demonstrate a reduction in symptoms. Moran will connect with a provider for ongoing mental health treatment, increase coping skills, healthy habits, self-management skills, and stress reduction        Clinical Interventions:  Assessed Moran's previous and current treatment, coping skills, support system and barriers to care. Moran and spouse provided hx  Verbalization of feelings encouraged, motivational interviewing employed Emotional support provided, positive coping strategies explored Self care/establishing healthy boundaries emphasized Moran reports that she has anxiety, depression and insomnia. She reports that she is not sleeping which makes her feel worse. Moran reports significant worsening anxiety, depression and insomnia impacting their ability to function appropriately and carry out daily task. Moran has NO support network. She and her 4 yo daughter live together.  Moran is agreeable to referral to [facility]  for counseling and psychiatry. [facility] LCSW made referral on 09/05/21. However, Moran is in need of immediate services and assistance and was encouraged to consider their walk in clinic. She is agreeable to go tomorrow but will have to take her daughter into school later because of it. [facility] LCSW completed joint phone call to [place] [facility] [place] with Moran. [facility] only has an inpatient unit available within [facility] [place]. [facility] LCSW and Moran [facility] with [facility] to inquire if they would accept Moran's insurance for medication management.  LCSW provided education on relaxation techniques such as meditation, deep breathing, massage, grounding exercises or yoga that can activate the body's relaxation response and ease symptoms of stress and anxiety. LCSW ask  that when pt is struggling with difficult emotions and racing thoughts that they start this relaxation response process. LCSW provided extensive education on healthy coping skills for anxiety. SW used active and reflective listening, validated Moran's feelings/concerns, and provided emotional support. Moran will work on implementing appropriate self-care habits into their daily routine such as: staying positive, writing a gratitude list, drinking water, staying active around the house, combating negative thoughts or emotions and staying connected.  Moran denies any SI/HI. [facility] LCSW sent Moran an email with mental health service resources within her area that accept her insurance on 09/05/21.  LCSW provided education on healthy sleep hygiene and what that looks like. LCSW encouraged Moran to implement a night time routine into their schedule that works best for them and that they are able to maintain. Advised Moran to implement deep breathing/grounding/meditation/self-care exercises into their nightly routine to combat racing thoughts at night. LCSW encouraged Moran to wake up at the same time each day, make their sleeping environment comfortable, exercise when able, to limit naps and to not eat or drink anything right before bed. UPDATE 10/03/21- Moran is wanting to enroll into [facility] mental health program. Referral has been made for both psychiatry and counseling but Moran has not received a call yet from agency. [facility] LCSW offered to complete a joint call to agency to get her appointments scheduled but Moran prefers to utilize their walk in clinic as their wait list is several months out at this time. [facility] LCSW sent Moran an additional email with their walk in hour information. Moran desired to gain information on how to apply for disability. [facility] LCSW provided education and advised to her to go to her local social security office. [facility] LCSW sent her resources on how to apply for disability  through email as well. Moran is agreeable to 30 day follow up next month to ensure that she went to walk in clinic and was established with a long term mental health provider.  UPDATE 11/02/21- Moran reports that she has been taking care of her sick father and then she got sick and had to go to the ED which caused her to not be able to go to the [facility] walk in clinic. Moran reports that she still wants psychiatry and counseling. [facility] LCSW and Moran completed joint phone call to [facility] and was able to get her scheduled for both psychiatry and counseling on 11/09/21 at 8:00 am. Moran will receive a discharge follow up call on 11/16/21 to ensure that all of her mental health needs have been met.   Motivational Interviewing employed Depression screen reviewed  PHQ2/ PHQ9 completed Mindfulness or Relaxation training provided Active listening / Reflection utilized  Advance Care and HCPOA education  provided Emotional Support Provided Problem [place] strategies reviewed Provided psychoeducation for mental health needs  Provided brief CBT  Reviewed mental health medications and discussed importance of compliance:  Quality of sleep assessed & Sleep Hygiene techniques promoted  Participation in counseling encouraged  Verbalization of feelings encouraged  Suicidal Ideation/Homicidal Ideation assessed: Moran denies SI/HI  Review resources, discussed options and provided Moran information about  [place] care team collaboration (see longitudinal plan of care) Moran Goals/Self-Care Activities: Over the next 120 days Attend scheduled medical appointments Utilize healthy coping skills and supportive resources discussed Contact PCP with any questions or concerns Keep 90 percent of counseling appointments Call your insurance provider for more information about your Enhanced Benefits  Check out counseling resources provided  Begin personal counseling  with LCSW, to reduce and manage symptoms of Depression and Stress, until well-established with mental health provider Accept all calls from representative with [facility] in an effort to establish ongoing mental health counseling and supportive services. Incorporate into daily practice - relaxation techniques, deep breathing exercises, and mindfulness meditation strategies. Talk about feelings with friends, family members, spiritual advisor, etc. Contact LCSW directly [phone]), if you have questions, need assistance, or if additional social work needs are identified between now and our next scheduled telephone outreach call. Call 988 for mental health hotline/crisis line if needed (24/7 available) Try techniques to reduce symptoms of anxiety/negative thinking (deep breathing, distraction, positive self talk, etc)  - develop a personal safety plan - develop a plan to deal with triggers like holidays, anniversaries - exercise at least 2 to 3 times per week - have a plan for how to handle bad days - journal feelings and what helps to feel better or worse - spend time or talk with others at least 2 to 3 times per week - watch for early signs of feeling worse - begin personal counseling - call and visit an old friend - check out volunteer opportunities - join a support group - laugh; watch a funny movie or comedian - learn and use visualization or guided imagery - perform a random act of kindness - practice relaxation or meditation daily - start or continue a personal journal - practice positive thinking and self-talk -continue with compliance of taking medication  -identify current effective and ineffective coping strategies.  -implement positive self-talk in care to increase self-esteem, confidence and feelings of control.  -consider alternative and complementary therapy approaches such as meditation, mindfulness or yoga.  -journaling, prayer, worship services, meditation or pastoral  counseling.  -increase participation in pleasurable group activities such as hobbies, singing, sports or volunteering).  -consider the use of meditative movement therapy such as tai chi, yoga or qigong.  -start a regular daily exercise program based on tolerance, ability and Moran choice to support positive thinking and activity        24- Hour Availability:    [facility]  [address] [place], [place] Crisis [phone]   [facility] [employer] [place]  [phone]    [employer]  [phone] (after hours)   Therapeutic Alternative/Mobile Crisis   [phone]   [place] National Suicide Hotline  (864)829-3074 (TALK) OR 988   Call 911 or go to emergency room   [facility]  [phone]);  [place] and [employer]  [phone]); [place], [place], [place], [place], [place], [place], [place]  10 LITTLE Things To Do When You're Feeling Too Down To Do Anything  Take a shower. Even if you plan to stay in all day long and not see a soul, take a shower. It takes the most effort to hop in to the shower but once you do, you'll feel immediate results. It will wake you up and you'll be feeling much fresher (and cleaner too).  Brush and floss your teeth. Give your teeth a good brushing with a floss finish. It's a small task but it feels so good and you can check 'taking care of your health' off the list of things to do.  Do something small on your list. Most of [place] have some small thing we would like to get done (load of laundry, sew a button, email a friend). Doing one of these things will make you feel like you've accomplished something.  Drink water. Drinking water is easy right? It's also really beneficial for your health so keep a glass beside you all day and take sips often. It gives you energy and prevents you from  boredom eating.  Do some floor exercises. The last thing you want to do is exercise but it might be just the thing you need the most. Keep it simple and do exercises that involve sitting or laying on the floor. Even the smallest of exercises release chemicals in the brain that make you feel good. Yoga stretches or core exercises are going to make you feel good with minimal effort.  Make your bed. Making your bed takes a few minutes but it's productive and you'll feel relieved when it's done. An unmade bed is a huge visual reminder that you're having an unproductive day. Do it and consider it your housework for the day.  Put on some nice clothes. Take the sweatpants off even if you don't plan to go anywhere. Put on clothes that make you feel good. Take a look in the mirror so your brain recognizes the sweatpants have been replaced with clothes that make you look great. It's an instant confidence booster.  Wash the dishes. A pile of dirty dishes in the sink is a reflection of your mood. It's possible that if you wash up the dishes, your mood will follow suit. It's worth a try.  Cook a real meal. If you have the luxury to have a "do nothing" day, you have time to make a real meal for yourself. Make a meal that you love to eat. The process is good to get you out of the funk and the food will ensure you have more energy for tomorrow.  Write out your thoughts by hand. When you hand write, you stimulate your brain to focus on the moment that you're in so make yourself comfortable and write whatever comes into your mind. Put those thoughts out on paper so they stop spinning around in your head. Those thoughts might be the very thing holding you down.   Moran Goals: Follow up goal     Follow up:  Moran agrees to Care Plan and Follow-up.  Plan: The Managed Medicaid care management team will reach out to the Moran again over the next 30 days.  Date/time of next scheduled Social Work care  management/care coordination outreach:  11/16/21 at 2:30 pm.   Eula Fried, BSW, MSW, [place] Medicaid LCSW [email or web address] Phone: [phone]

## 2021-11-02 NOTE — Patient Instructions (Signed)
Visit Information  Ann Moran was given information about Medicaid Managed Care team care coordination services as a part of their [place] Medicaid benefit. Ann Moran verbally consentedto engagement with the [facility] Managed Care team.   If you are experiencing a medical emergency, please call 911 or report to your local emergency department or urgent care.   If you have a non-emergency medical problem during routine business hours, please contact your provider's office and ask to speak with a nurse.   For questions related to your [facility] health plan, please call: [phone]  OR visit the member homepage at: [email or web address]  If you would like to schedule transportation through your [facility] plan, please call the following number at least 2 days in advance of your appointment: [phone]  If you are experiencing a behavioral health crisis, call the [place] at [phone]). The line is available 24 hours a day, seven days a week.  If you would like help to quit smoking, call 1-800-QUIT-NOW 249-306-7304) OR Espaol: 1-855-Djelo-Ya (9-485-462-7035) o para ms informacin haga clic aqu or Text READY to 200-400 to register via text   Following is a copy of your plan of care:  Care Plan : LCSW Plan of Care  Updates made by Greg Cutter, LCSW since 11/02/2021 12:00 AM     Problem: Coping Skills (General Plan of Care)      Long-Range Goal: I need mental health support   Start Date: 09/05/2021  Priority: High  Note:   Priority: High  Timeframe:  Long-Range Goal Priority:  High Start Date:   09/05/21            Expected End Date:  11/02/21 at 1:00 pm                   Follow Up Date--11/02/21 at 1:00 pm.  - check out counseling - keep 90 percent of counseling appointments - schedule counseling  appointment    Why is this important?             Beating depression may take some time.            If you don't feel better right away, don't give up on your treatment plan.    Current barriers:   Chronic Mental Health needs related to depression, insomnia, stress and anxiety. Patient requires Support, Education, Resources, Referrals, Advocacy, and Care Coordination, in order to meet Unmet Mental Health Needs. Patient will implement clinical interventions discussed today to decrease symptoms of depression and increase knowledge and/or ability of: coping skills. Mental Health Concerns and Social Isolation Patient lacks knowledge of available community counseling agencies and resources.  Clinical Goal(s): verbalize understanding of plan for management of Anxiety, Depression, Insomnia and Stress and demonstrate a reduction in symptoms. Patient will connect with a provider for ongoing mental health treatment, increase coping skills, healthy habits, self-management skills, and stress reduction        Patient Goals/Self-Care Activities: Over the next 120 days Attend scheduled medical appointments Utilize healthy coping skills and supportive resources discussed Contact PCP with any questions or concerns Keep 90 percent of counseling appointments Call your insurance provider for more information about your Enhanced Benefits  Check out counseling resources provided  Begin personal counseling with LCSW, to reduce and manage symptoms of Depression and Stress, until well-established with mental health provider Accept all calls from representative with [facility] in an effort to establish ongoing mental  health counseling and supportive services. Incorporate into daily practice - relaxation techniques, deep breathing exercises, and mindfulness meditation strategies. Talk about feelings with friends, family members, spiritual advisor, etc. Contact LCSW directly [phone]), if you have questions, need  assistance, or if additional social work needs are identified between now and our next scheduled telephone outreach call. Call 988 for mental health hotline/crisis line if needed (24/7 available) Try techniques to reduce symptoms of anxiety/negative thinking (deep breathing, distraction, positive self talk, etc)  - develop a personal safety plan - develop a plan to deal with triggers like holidays, anniversaries - exercise at least 2 to 3 times per week - have a plan for how to handle bad days - journal feelings and what helps to feel better or worse - spend time or talk with others at least 2 to 3 times per week - watch for early signs of feeling worse - begin personal counseling - call and visit an old friend - check out volunteer opportunities - join a support group - laugh; watch a funny movie or comedian - learn and use visualization or guided imagery - perform a random act of kindness - practice relaxation or meditation daily - start or continue a personal journal - practice positive thinking and self-talk -continue with compliance of taking medication  -identify current effective and ineffective coping strategies.  -implement positive self-talk in care to increase self-esteem, confidence and feelings of control.  -consider alternative and complementary therapy approaches such as meditation, mindfulness or yoga.  -journaling, prayer, worship services, meditation or pastoral counseling.  -increase participation in pleasurable group activities such as hobbies, singing, sports or volunteering).  -consider the use of meditative movement therapy such as tai chi, yoga or qigong.  -start a regular daily exercise program based on tolerance, ability and patient choice to support positive thinking and activity        24- Hour Availability:    [facility]  [address] [place], [place] Crisis [phone]   [facility] of the  [employer] [place]  [phone]    [place]  [phone] (after hours)   Therapeutic Alternative/Mobile Crisis   [phone]   [place] National Suicide Hotline  740-596-6929 (TALK) OR 988   Call 911 or go to emergency room   [facility]  [phone]);  [place] and [employer]  [phone]); [employer], [place], [place], [place], [place], [place], [place]        10 LITTLE Things To Do When You're Feeling Too Down To Do Anything  Take a shower. Even if you plan to stay in all day long and not see a soul, take a shower. It takes the most effort to hop in to the shower but once you do, you'll feel immediate results. It will wake you up and you'll be feeling much fresher (and cleaner too).  Brush and floss your teeth. Give your teeth a good brushing with a floss finish. It's a small task but it feels so good and you can check 'taking care of your health' off the list of things to do.  Do something small on your list. Most of [place] have some small thing we would like to get done (load of laundry, sew a button, email a friend). Doing one of these things will make you feel like you've accomplished something.  Drink water. Drinking water is easy right? It's also really beneficial for  your health so keep a glass beside you all day and take sips often. It gives you energy and prevents you from boredom eating.  Do some floor exercises. The last thing you want to do is exercise but it might be just the thing you need the most. Keep it simple and do exercises that involve sitting or laying on the floor. Even the smallest of exercises release chemicals in the brain that make you feel good. Yoga stretches or core exercises are going to make you feel good with minimal effort.  Make your bed. Making your bed takes a few minutes but it's productive and you'll feel relieved when it's done. An unmade bed  is a huge visual reminder that you're having an unproductive day. Do it and consider it your housework for the day.  Put on some nice clothes. Take the sweatpants off even if you don't plan to go anywhere. Put on clothes that make you feel good. Take a look in the mirror so your brain recognizes the sweatpants have been replaced with clothes that make you look great. It's an instant confidence booster.  Wash the dishes. A pile of dirty dishes in the sink is a reflection of your mood. It's possible that if you wash up the dishes, your mood will follow suit. It's worth a try.  Cook a real meal. If you have the luxury to have a "do nothing" day, you have time to make a real meal for yourself. Make a meal that you love to eat. The process is good to get you out of the funk and the food will ensure you have more energy for tomorrow.  Write out your thoughts by hand. When you hand write, you stimulate your brain to focus on the moment that you're in so make yourself comfortable and write whatever comes into your mind. Put those thoughts out on paper so they stop spinning around in your head. Those thoughts might be the very thing holding you down.   Patient Goals: Follow up goal  Eula Fried, BSW, MSW, [facility] Managed Medicaid LCSW [email or web address] Phone: [phone]

## 2021-11-03 ENCOUNTER — Ambulatory Visit (HOSPITAL_COMMUNITY): Admission: RE | Admit: 2021-11-03 | Payer: Medicaid Other | Source: Ambulatory Visit

## 2021-11-06 ENCOUNTER — Ambulatory Visit: Payer: Medicaid Other | Admitting: Internal Medicine

## 2021-11-07 ENCOUNTER — Telehealth: Payer: Self-pay | Admitting: Internal Medicine

## 2021-11-07 ENCOUNTER — Telehealth: Payer: Self-pay | Admitting: *Deleted

## 2021-11-07 ENCOUNTER — Ambulatory Visit
Admission: RE | Admit: 2021-11-07 | Discharge: 2021-11-07 | Disposition: A | Discharge status: Home / Self Care | Payer: Medicaid Other | Source: Ambulatory Visit | Attending: Student | Admitting: Student

## 2021-11-07 VITALS — BP 151/99 | HR 88 | Temp 98.7°F | Resp 20

## 2021-11-07 DIAGNOSIS — Z3202 Encounter for pregnancy test, result negative: Secondary | ICD-10-CM | POA: Insufficient documentation

## 2021-11-07 DIAGNOSIS — N760 Acute vaginitis: Secondary | ICD-10-CM | POA: Insufficient documentation

## 2021-11-07 LAB — POCT URINALYSIS DIP (MANUAL ENTRY)
Bilirubin, UA: NEGATIVE
Glucose, UA: NEGATIVE mg/dL
Ketones, POC UA: NEGATIVE mg/dL
Leukocytes, UA: NEGATIVE
Nitrite, UA: NEGATIVE
Protein Ur, POC: NEGATIVE mg/dL
Spec Grav, UA: 1.015 (ref 1.010–1.025)
Urobilinogen, UA: 1 E.U./dL
pH, UA: 7 (ref 5.0–8.0)

## 2021-11-07 LAB — POCT URINE PREGNANCY: Preg Test, Ur: NEGATIVE

## 2021-11-07 MED ORDER — METRONIDAZOLE 500 MG PO TABS: 500.0000 mg | ORAL_TABLET | Freq: Two times a day (BID) | ORAL | 0 refills | Status: DC

## 2021-11-07 MED ORDER — FLUCONAZOLE 150 MG PO TABS: 150.0000 mg | ORAL_TABLET | Freq: Every day | ORAL | 0 refills | Status: DC

## 2021-11-07 NOTE — Discharge Instructions (Addendum)
-  For bacterial vaginosis, start the antibiotic-Flagyl (metronidazole), 2 pills daily for 7 days.  You can take this with food if you have a sensitive stomach.  Avoid alcohol while taking this medication and for 2 days after as this will cause severe nausea and vomiting. -For your yeast infection, start the Diflucan (fluconazole)- Take one pill today (day 1). If you're still having symptoms in 3 days, take the second pill.  -We have sent testing for sexually transmitted infections. We will notify you of any positive results once they are received, this typically takes 2 to 3 days. If required, we will prescribe any additional medications you might need. Please refrain from all sexual activity until treatment is complete.  -Seek additional medical attention if you develop fevers/chills, new/worsening abdominal pain, new/worsening vaginal discomfort/discharge, etc.

## 2021-11-07 NOTE — Telephone Encounter (Signed)
Patient called to cancel her procedure scheduled for 11/10/21 due to just getting out of the hospital and would like to get better first. The patient was informed of cancellation fee she said she will call back to reschedule.

## 2021-11-07 NOTE — ED Triage Notes (Signed)
Pt woke up this morning with sharp suprapubic pain and has had white vaginal discharge and odor x 1 week.

## 2021-11-07 NOTE — Telephone Encounter (Signed)
Jill Side,  This pt is cleared for anesthetic care at [facility].   Thanks,  Cathlyn Parsons

## 2021-11-07 NOTE — ED Provider Notes (Signed)
[facility]    CSN: [id number] Arrival date & time: 11/07/21  0848      History   Chief Complaint Chief Complaint  Patient presents with   Abdominal Pain    Pain when I cough use the bathroom breathe it just hurts - Entered by patient    HPI Ann Moran is a 36 y.o. female presenting with vaginitis. history PCOS, UTI, cholecystectomy, esophageal dysphagia (followed by GI, taking Prilosec daily), chronic cough.  States vaginal discharge for 1 week, with new onset of crampy lower abdominal pain in the suprapubic area.  She describes the discharge as white and thick with foul odor.  The abdominal pain only started today and is in the suprapubic area.  She does note that she recently used a new fragranced feminine wash in the vaginal area. Denies hematuria, dysuria, frequency, urgency, back pain, n/v/d, fevers/chills, abdnormal vaginal rash/lesion.    HPI  Past Medical History:  Diagnosis Date   Allergy    Anemia    Anxiety    Chlamydia    Depression    hx of meds, none currently- doing ok   Diabetes mellitus without complication (HCC)    GERD (gastroesophageal reflux disease)    Infection due to trichomonas    MRSA (methicillin resistant staph aureus) culture positive    Dec 2012   Obesity    Pregnancy induced hypertension    Urinary tract infection     Patient Active Problem List   Diagnosis Date Noted   Smoking 02/23/2021   Patellar tendinitis of both knees 01/27/2021   Pain in joint involving right ankle and foot 01/27/2021   Thoracic back pain 12/23/2020   Right elbow pain 12/23/2020   Chronic pain of right knee 12/23/2020   Ankle fracture, left 08/09/2020   Dysphagia 05/20/2019   Encounter for other preprocedural examination 05/20/2019   PCOS (polycystic ovarian syndrome) 02/24/2019   Neck pain 02/24/2019   Morbid obesity with body mass index (BMI) of 45.0 to 49.9 in adult [facility]) 02/24/2019   Essential hypertension 02/24/2019   Gastroesophageal  reflux disease 02/24/2019   Irregular menstrual cycle 01/14/2019   Abnormal uterine bleeding 01/14/2019   Chronic cough 01/14/2019    Past Surgical History:  Procedure Laterality Date   CHOLECYSTECTOMY     ECTOPIC PREGNANCY SURGERY     ESOPHAGOGASTRODUODENOSCOPY  05/2019    OB History     Gravida  2   Para  1   Term  0   Preterm  1   AB  1   Living  1      SAB      IAB      Ectopic  1   Multiple      Live Births  1            Home Medications    Prior to Admission medications   Medication Sig Start Date End Date Taking? Authorizing Provider  fluconazole (DIFLUCAN) 150 MG tablet Take 1 tablet (150 mg total) by mouth daily. -For your yeast infection, start the Diflucan (fluconazole)- Take one pill today (day 1). If you're still having symptoms in 3 days, take the second pill. 11/07/21  Yes Rhys Martini, PA-C  metroNIDAZOLE (FLAGYL) 500 MG tablet Take 1 tablet (500 mg total) by mouth 2 (two) times daily. 11/07/21  Yes Rhys Martini, PA-C  albuterol (VENTOLIN HFA) 108 (90 Base) MCG/ACT inhaler Inhale 2 puffs into the lungs every 6 (six) hours as needed for  wheezing or shortness of breath (Cough). 07/04/21   Theadora Rama Scales, PA-C  amLODipine (NORVASC) 10 MG tablet TAKE 1 TABLET(10 MG) BY MOUTH DAILY 09/26/21   Hoy Register, MD  benzonatate (TESSALON) 100 MG capsule Take 1 capsule (100 mg total) by mouth 3 (three) times daily as needed. 11/01/21   [name], PA-C  Blood Pressure Monitoring (BLOOD PRESSURE CUFF) MISC Use to check blood pressure once daily. 08/29/21   Hoy Register, MD  busPIRone (BUSPAR) 15 MG tablet Take 1 tablet (15 mg total) by mouth 2 (two) times daily. 09/14/21   Anders Simmonds, PA-C  cetirizine (ZYRTEC ALLERGY) 10 MG tablet Take 1 tablet (10 mg total) by mouth at bedtime. 07/04/21 12/31/21  Theadora Rama Scales, PA-C  diclofenac (VOLTAREN) 75 MG EC tablet TAKE 1 TABLET(75 MG) BY MOUTH TWICE DAILY 09/15/21   [name], Azucena Fallen, MD  fluticasone (FLONASE) 50 MCG/ACT nasal spray Place 2 sprays into both nostrils daily. 07/04/21   Theadora Rama Scales, PA-C  hydrOXYzine (ATARAX) 25 MG tablet Take 1 tablet (25 mg total) by mouth every 8 (eight) hours as needed for anxiety. 10/11/21   Hoy Register, MD  ipratropium (ATROVENT) 0.06 % nasal spray Place 2 sprays into both nostrils 4 (four) times daily. As needed for nasal congestion, runny nose 07/04/21   Theadora Rama Scales, PA-C  losartan (COZAAR) 100 MG tablet TAKE 1 TABLET(100 MG) BY MOUTH DAILY 09/26/21   Hoy Register, MD  omeprazole (PRILOSEC) 40 MG capsule Take 1 capsule (40 mg total) by mouth daily. 09/15/21   Marcine Matar, MD  promethazine-dextromethorphan (PROMETHAZINE-DM) 6.25-15 MG/5ML syrup Take 5-10 mLs by mouth 4 (four) times daily as needed. 11/01/21   [name], PA-C  traZODone (DESYREL) 50 MG tablet Take 0.5-1 tablets (25-50 mg total) by mouth at bedtime as needed for sleep. 09/14/21   Anders Simmonds, PA-C  medroxyPROGESTERone (PROVERA) 5 MG tablet Take 2 tablets (10 mg total) by mouth daily. Patient not taking: Reported on 07/20/2018 06/30/18 12/06/18  Geoffery Lyons, MD    Family History Family History  Problem Relation Age of Onset   Hypertension Mother    Diabetes Mother    Asthma Father    Hypertension Father    Stroke Maternal Grandmother    Diabetes Paternal Grandmother    Hypertension Paternal Grandmother    Colon cancer Paternal Grandmother    Colon cancer Paternal Aunt    Esophageal cancer Neg Hx    Stomach cancer Neg Hx    Rectal cancer Neg Hx     Social History Social History   Tobacco Use   Smoking status: Every Day    Packs/day: 0.50    Years: 9.00    Total pack years: 4.50    Types: Cigarettes   Smokeless tobacco: Never  Vaping Use   Vaping Use: Some days   Last attempt to quit: 08/18/2015   Devices: "rarely"  Substance Use Topics   Alcohol use: Yes   Drug use: Yes    Types: Marijuana    Comment:  Smokes daily to treat migraines     Allergies   Prednisone   Review of Systems Review of Systems  Gastrointestinal:  Positive for abdominal pain.  Genitourinary:  Positive for vaginal discharge.  All other systems reviewed and are negative.    Physical Exam Triage Vital Signs ED Triage Vitals  Enc Vitals Group     BP      Pulse      Resp  Temp      Temp src      SpO2      Weight      Height      Head Circumference      Peak Flow      Pain Score      Pain Loc      Pain Edu?      Excl. in GC?    No data found.  Updated Vital Signs BP (!) 151/99   Pulse 88   Temp 98.7 F (37.1 C)   Resp 20   SpO2 98%   Visual Acuity Right Eye Distance:   Left Eye Distance:   Bilateral Distance:    Right Eye Near:   Left Eye Near:    Bilateral Near:     Physical Exam Vitals reviewed.  Constitutional:      General: She is not in acute distress.    Appearance: Normal appearance. She is not ill-appearing.  HENT:     Head: Normocephalic and atraumatic.     Mouth/Throat:     Mouth: Mucous membranes are moist.     Comments: Moist mucous membranes Eyes:     Extraocular Movements: Extraocular movements intact.     Pupils: Pupils are equal, round, and reactive to light.  Cardiovascular:     Rate and Rhythm: Normal rate and regular rhythm.     Heart sounds: Normal heart sounds.  Pulmonary:     Effort: Pulmonary effort is normal.     Breath sounds: Normal breath sounds. No wheezing, rhonchi or rales.  Abdominal:     General: Bowel sounds are normal. There is no distension.     Palpations: Abdomen is soft. There is no mass.     Tenderness: There is abdominal tenderness in the suprapubic area. There is no right CVA tenderness, left CVA tenderness, guarding or rebound. Negative signs include Murphy's sign, Rovsing's sign and McBurney's sign.     Comments: The suprapubic region is mildly tender to palpation, without guarding or rebound.  Comfortable throughout exam.   Skin:    General: Skin is warm.     Capillary Refill: Capillary refill takes less than 2 seconds.     Comments: Good skin turgor  Neurological:     General: No focal deficit present.     Mental Status: She is alert and oriented to person, place, and time.  Psychiatric:        Mood and Affect: Mood normal.        Behavior: Behavior normal.      UC Treatments / Results  Labs (all labs ordered are listed, but only abnormal results are displayed) Labs Reviewed  POCT URINALYSIS DIP (MANUAL ENTRY) - Abnormal; Notable for the following components:      Result Value   Blood, UA moderate (*)    All other components within normal limits  POCT URINE PREGNANCY  CERVICOVAGINAL ANCILLARY ONLY    EKG   Radiology No results found.  Procedures Procedures (including critical care time)  Medications Ordered in UC Medications - No data to display  Initial Impression / Assessment and Plan / UC Course  I have reviewed the triage vital signs and the nursing notes.  Pertinent labs & imaging results that were available during my care of the patient were reviewed by me and considered in my medical decision making (see chart for details).     This patient is a very pleasant 36 y.o. year old female presenting with vaginitis. Afebrile, nontachycardic,  minimal suprapubic pain and no CVAT.  Pt with PCOS - LMP 2 years ago. UA with blood, otherwise within normal limits.  Did not send culture.  Urine pregnancy negative. Same female partner for 7 years.  Will send self-swab for G/C, trich, yeast, BV testing. Declines HIV, RPR. Safe sex precautions.   We will cover with metronidazole and Diflucan while awaiting test results.  She is in agreement.  ED return precautions discussed. Patient verbalizes understanding and agreement.    Final Clinical Impressions(s) / UC Diagnoses   Final diagnoses:  Vaginitis and vulvovaginitis  Negative pregnancy test     Discharge Instructions      -For  bacterial vaginosis, start the antibiotic-Flagyl (metronidazole), 2 pills daily for 7 days.  You can take this with food if you have a sensitive stomach.  Avoid alcohol while taking this medication and for 2 days after as this will cause severe nausea and vomiting. -For your yeast infection, start the Diflucan (fluconazole)- Take one pill today (day 1). If you're still having symptoms in 3 days, take the second pill.  -We have sent testing for sexually transmitted infections. We will notify you of any positive results once they are received, this typically takes 2 to 3 days. If required, we will prescribe any additional medications you might need. Please refrain from all sexual activity until treatment is complete.  -Seek additional medical attention if you develop fevers/chills, new/worsening abdominal pain, new/worsening vaginal discomfort/discharge, etc.       ED Prescriptions     Medication Sig Dispense Auth. Provider   metroNIDAZOLE (FLAGYL) 500 MG tablet Take 1 tablet (500 mg total) by mouth 2 (two) times daily. 14 tablet Rhys Martini, PA-C   fluconazole (DIFLUCAN) 150 MG tablet Take 1 tablet (150 mg total) by mouth daily. -For your yeast infection, start the Diflucan (fluconazole)- Take one pill today (day 1). If you're still having symptoms in 3 days, take the second pill. 2 tablet Rhys Martini, PA-C      PDMP not reviewed this encounter.   Rhys Martini, PA-C 11/07/21 [phone]

## 2021-11-08 ENCOUNTER — Other Ambulatory Visit: Payer: Self-pay | Admitting: Pharmacist

## 2021-11-08 LAB — CERVICOVAGINAL ANCILLARY ONLY
Bacterial Vaginitis (gardnerella): NEGATIVE
Candida Glabrata: NEGATIVE
Candida Vaginitis: NEGATIVE
Chlamydia: NEGATIVE
Comment: NEGATIVE
Comment: NEGATIVE
Comment: NEGATIVE
Comment: NEGATIVE
Comment: NEGATIVE
Comment: NORMAL
Neisseria Gonorrhea: NEGATIVE
Trichomonas: NEGATIVE

## 2021-11-08 NOTE — Chronic Care Management (AMB) (Signed)
Chief Complaint  Patient presents with   Hypertension    Ann Moran is a 36 y.o. year old female who presented for a telephone visit.   They were referred to the pharmacist by a quality report for assistance in managing hypertension.   Patient is participating in a Managed Medicaid Plan:  Yes  Subjective:  Care Team: Primary Care Provider: Hoy Register, MD ; Next Scheduled Visit: scheduled with Dr. Delford Field   Medication Access/Adherence  Current Pharmacy:  [facility] [phone] - [place], [place] - [phone] [facility] [address] AT [facility] [address] & [address] [place] [address] Phone: [phone] Fax: [phone]  [facility] - [place], [place] - [address] [place] [address] Phone: [phone] Fax: [phone]  [id number] - [place], [place] - [address] AT [facility] [address] [place] [address] Phone: [phone] Fax: [phone]   Patient reports affordability concerns with their medications: No  Patient reports access/transportation concerns to their pharmacy: No  Patient reports adherence concerns with their medications:  No     Hypertension:  Current medications: losartan 100 mg daily, amlodipine 10 mg daily  Patient has a wrist BP cuff but is not sure about accuracy. Reports that she checked two readings back to back and had a reading of 112/70 then 201/110   Patient denies hypotensive s/sx including dizziness, lightheadedness.  Patient denies hypertensive symptoms including headache, chest pain, shortness of breath    Health Maintenance  Health Maintenance Due  Topic Date Due   COVID-19 Vaccine (2 - Pfizer series) 11/26/2019     Objective: Lab Results  Component Value Date   HGBA1C 5.7 (H) 02/23/2021    Lab Results  Component Value Date   CREATININE 0.77 02/23/2021   BUN 6 02/23/2021   NA 142  02/23/2021   K 4.2 02/23/2021   CL 102 02/23/2021   CO2 26 02/23/2021    No results found for: "CHOL", "HDL", "LDLCALC", "LDLDIRECT", "TRIG", "CHOLHDL"  Medications Reviewed Today     Reviewed by Alden Hipp, RPH-CPP (Pharmacist) on 11/08/21 at 1059  Med List Status: <None>   Medication Order Taking? Sig Documenting Provider Last Dose Status Informant  albuterol (VENTOLIN HFA) 108 (90 Base) MCG/ACT inhaler [id number]  Inhale 2 puffs into the lungs every 6 (six) hours as needed for wheezing or shortness of breath (Cough). Theadora Rama Scales, PA-C  Active   amLODipine (NORVASC) 10 MG tablet [id number] Yes TAKE 1 TABLET(10 MG) BY MOUTH DAILY Hoy Register, MD Taking Active   benzonatate (TESSALON) 100 MG capsule [id number]  Take 1 capsule (100 mg total) by mouth 3 (three) times daily as needed. [name], PA-C  Active   Blood Pressure Monitoring (BLOOD PRESSURE CUFF) MISC [id number]  Use to check blood pressure once daily. Hoy Register, MD  Active   busPIRone (BUSPAR) 15 MG tablet [id number]  Take 1 tablet (15 mg total) by mouth 2 (two) times daily. Georgian Co M, PA-C  Active   cetirizine (ZYRTEC ALLERGY) 10 MG tablet [id number]  Take 1 tablet (10 mg total) by mouth at bedtime. Theadora Rama Scales, PA-C  Active   diclofenac (VOLTAREN) 75 MG EC tablet [id number]  TAKE 1 TABLET(75 MG) BY MOUTH TWICE DAILY [name], Azucena Fallen, MD  Active   fluconazole (DIFLUCAN) 150 MG tablet [id number]  Take 1 tablet (150 mg total) by mouth daily. -For your yeast infection, start the Diflucan (fluconazole)- Take  one pill today (day 1). If you're still having symptoms in 3 days, take the second pill. Rhys Martini, PA-C  Active   fluticasone [facility]) 50 MCG/ACT nasal spray [id number]  Place 2 sprays into both nostrils daily. Theadora Rama Scales, PA-C  Active   hydrOXYzine (ATARAX) 25 MG tablet [id number]  Take 1 tablet (25 mg total) by mouth every 8 (eight) hours as needed for anxiety.  Hoy Register, MD  Active   ipratropium (ATROVENT) 0.06 % nasal spray [id number]  Place 2 sprays into both nostrils 4 (four) times daily. As needed for nasal congestion, runny nose Theadora Rama Scales, PA-C  Active   losartan (COZAAR) 100 MG tablet [id number] Yes TAKE 1 TABLET(100 MG) BY MOUTH DAILY Hoy Register, MD Taking Active    Patient not taking:   Discontinued 12/06/18 0950 metroNIDAZOLE (FLAGYL) 500 MG tablet [id number]  Take 1 tablet (500 mg total) by mouth 2 (two) times daily. Rhys Martini, PA-C  Active   omeprazole (PRILOSEC) 40 MG capsule [id number]  Take 1 capsule (40 mg total) by mouth daily. Marcine Matar, MD  Active   promethazine-dextromethorphan (PROMETHAZINE-DM) 6.25-15 MG/5ML syrup [id number]  Take 5-10 mLs by mouth 4 (four) times daily as needed. Joycelyn Man M, PA-C  Active   traZODone (DESYREL) 50 MG tablet [id number]  Take 0.5-1 tablets (25-50 mg total) by mouth at bedtime as needed for sleep. Anders Simmonds, PA-C  Active               Assessment/Plan:   Hypertension: - Currently uncontrolled - Reviewed appropriate blood pressure monitoring technique and reviewed goal blood pressure. Recommended to check home blood pressure and heart rate periodically and bring cuff to next appointment for comparison.  - Amenable to scheduling with embedded pharmacist for HTN. Recommend changing for more potent ARB and/or consider addition of thiazide diuretic   Follow Up Plan: phone call in 6 weeks  [name], PharmD, [facility] [phone]

## 2021-11-08 NOTE — Patient Instructions (Signed)
Ann Moran,   It was great talking to you today!  Check your blood pressure once daily, and any time you have concerning symptoms like headache, chest pain, dizziness, shortness of breath, or vision changes.   Our goal is less than 130/80.  To appropriately check your blood pressure, make sure you do the following:  1) Avoid caffeine, exercise, or tobacco products for 30 minutes before checking. Empty your bladder. 2) Sit with your back supported in a flat-backed chair. Rest your arm on something flat (arm of the chair, table, etc). 3) Sit still with your feet flat on the floor, resting, for at least 5 minutes.  4) Check your blood pressure. Take 1-2 readings.  5) Write down these readings and bring with you to any provider appointments.  Bring your home blood pressure machine with you to a provider's office for accuracy comparison at least once a year.   Make sure you take your blood pressure medications before you come to any office visit, even if you were asked to fast for labs.  Ann Moran, PharmD, [facility] [phone]

## 2021-11-09 ENCOUNTER — Ambulatory Visit: Payer: Medicaid Other

## 2021-11-09 ENCOUNTER — Ambulatory Visit (HOSPITAL_COMMUNITY): Payer: Medicaid Other | Admitting: Student in an Organized Health Care Education/Training Program

## 2021-11-10 ENCOUNTER — Encounter: Payer: Medicaid Other | Admitting: Internal Medicine

## 2021-11-15 ENCOUNTER — Emergency Department (HOSPITAL_COMMUNITY)
Admission: EM | Admit: 2021-11-15 | Discharge: 2021-11-16 | Disposition: A | Discharge status: Home / Self Care | Payer: Medicaid Other | Attending: Emergency Medicine | Admitting: Emergency Medicine

## 2021-11-15 ENCOUNTER — Telehealth: Payer: Medicaid Other | Admitting: Physician Assistant

## 2021-11-15 ENCOUNTER — Emergency Department (HOSPITAL_COMMUNITY): Payer: Medicaid Other

## 2021-11-15 ENCOUNTER — Other Ambulatory Visit: Payer: Self-pay

## 2021-11-15 ENCOUNTER — Encounter (HOSPITAL_COMMUNITY): Payer: Self-pay

## 2021-11-15 DIAGNOSIS — R310 Gross hematuria: Secondary | ICD-10-CM

## 2021-11-15 DIAGNOSIS — R109 Unspecified abdominal pain: Secondary | ICD-10-CM | POA: Diagnosis not present

## 2021-11-15 DIAGNOSIS — R319 Hematuria, unspecified: Secondary | ICD-10-CM

## 2021-11-15 DIAGNOSIS — N200 Calculus of kidney: Secondary | ICD-10-CM

## 2021-11-15 DIAGNOSIS — R1084 Generalized abdominal pain: Secondary | ICD-10-CM

## 2021-11-15 DIAGNOSIS — R350 Frequency of micturition: Secondary | ICD-10-CM

## 2021-11-15 DIAGNOSIS — M545 Low back pain, unspecified: Secondary | ICD-10-CM

## 2021-11-15 DIAGNOSIS — R102 Pelvic and perineal pain: Secondary | ICD-10-CM

## 2021-11-15 LAB — CBC WITH DIFFERENTIAL/PLATELET
Abs Immature Granulocytes: 0.03 10*3/uL (ref 0.00–0.07)
Basophils Absolute: 0 10*3/uL (ref 0.0–0.1)
Basophils Relative: 0 %
Eosinophils Absolute: 0.5 10*3/uL (ref 0.0–0.5)
Eosinophils Relative: 6 %
HCT: 38.5 % (ref 36.0–46.0)
Hemoglobin: 12.5 g/dL (ref 12.0–15.0)
Immature Granulocytes: 0 %
Lymphocytes Relative: 35 %
Lymphs Abs: 2.9 10*3/uL (ref 0.7–4.0)
MCH: 30.3 pg (ref 26.0–34.0)
MCHC: 32.5 g/dL (ref 30.0–36.0)
MCV: 93.2 fL (ref 80.0–100.0)
Monocytes Absolute: 0.6 10*3/uL (ref 0.1–1.0)
Monocytes Relative: 7 %
Neutro Abs: 4.3 10*3/uL (ref 1.7–7.7)
Neutrophils Relative %: 52 %
Platelets: 293 10*3/uL (ref 150–400)
RBC: 4.13 MIL/uL (ref 3.87–5.11)
RDW: 13.2 % (ref 11.5–15.5)
WBC: 8.3 10*3/uL (ref 4.0–10.5)
nRBC: 0 % (ref 0.0–0.2)

## 2021-11-15 LAB — COMPREHENSIVE METABOLIC PANEL
ALT: 16 U/L (ref 0–44)
AST: 14 U/L — ABNORMAL LOW (ref 15–41)
Albumin: 3.6 g/dL (ref 3.5–5.0)
Alkaline Phosphatase: 48 U/L (ref 38–126)
Anion gap: 6 (ref 5–15)
BUN: 7 mg/dL (ref 6–20)
CO2: 26 mmol/L (ref 22–32)
Calcium: 8.7 mg/dL — ABNORMAL LOW (ref 8.9–10.3)
Chloride: 108 mmol/L (ref 98–111)
Creatinine, Ser: 0.75 mg/dL (ref 0.44–1.00)
GFR, Estimated: >60 mL/min (ref >60)
Glucose, Bld: 104 mg/dL — ABNORMAL HIGH (ref 70–99)
Potassium: 3.6 mmol/L (ref 3.5–5.1)
Sodium: 140 mmol/L (ref 135–145)
Total Bilirubin: 0.3 mg/dL (ref 0.3–1.2)
Total Protein: 6.8 g/dL (ref 6.5–8.1)

## 2021-11-15 LAB — LACTIC ACID, PLASMA: Lactic Acid, Venous: 1.2 mmol/L (ref 0.5–1.9)

## 2021-11-15 MED ORDER — IOHEXOL 300 MG/ML  SOLN
100.0000 mL | Freq: Once | INTRAMUSCULAR | Status: AC | PRN
  Administered 2021-11-15: 100 mL via INTRAVENOUS

## 2021-11-15 NOTE — Telephone Encounter (Signed)
Dr. Gessner,  This pt is cleared for anesthetic care at [facility].  Thanks,  Mikhaila Roh 

## 2021-11-15 NOTE — ED Provider Notes (Signed)
[facility]-EMERGENCY DEPT Provider Note   CSN: [id number] Arrival date & time: 11/15/21  2119     History  Chief Complaint  Patient presents with   Back Pain   Abdominal Pain   Hematuria    Ann Moran is a 36 y.o. female.   Back Pain Associated symptoms: abdominal pain   Abdominal Pain Associated symptoms: hematuria   Hematuria Associated symptoms include abdominal pain.  Patient presents with hematuria back pain abdominal pain.  Comes and goes.  Had hematuria that was found on urinalysis at urgent care.  Also had pelvic exam due to some previous vaginal discharge.  Cultures done then and were negative.  Did not take the Diflucan and Flagyl after negative cultures.  Now has a crampy pains.  Comes and goes.  Sometimes in the low back sometimes in the upper back.  Does not change with eating.  Previous cholecystectomy.  Not having dysuria but still states there is a little bit of blood.    Past Medical History:  Diagnosis Date   Allergy    Anemia    Anxiety    Chlamydia    Depression    hx of meds, none currently- doing ok   Diabetes mellitus without complication (HCC)    GERD (gastroesophageal reflux disease)    Infection due to trichomonas    MRSA (methicillin resistant staph aureus) culture positive    Dec 2012   Obesity    Pregnancy induced hypertension    Urinary tract infection     Home Medications Prior to Admission medications   Medication Sig Start Date End Date Taking? Authorizing Provider  albuterol (VENTOLIN HFA) 108 (90 Base) MCG/ACT inhaler Inhale 2 puffs into the lungs every 6 (six) hours as needed for wheezing or shortness of breath (Cough). 07/04/21   Theadora Rama Scales, PA-C  amLODipine (NORVASC) 10 MG tablet TAKE 1 TABLET(10 MG) BY MOUTH DAILY 09/26/21   Hoy Register, MD  benzonatate (TESSALON) 100 MG capsule Take 1 capsule (100 mg total) by mouth 3 (three) times daily as needed. 11/01/21   [name], PA-C   Blood Pressure Monitoring (BLOOD PRESSURE CUFF) MISC Use to check blood pressure once daily. 08/29/21   Hoy Register, MD  busPIRone (BUSPAR) 15 MG tablet Take 1 tablet (15 mg total) by mouth 2 (two) times daily. 09/14/21   Anders Simmonds, PA-C  cetirizine (ZYRTEC ALLERGY) 10 MG tablet Take 1 tablet (10 mg total) by mouth at bedtime. 07/04/21 12/31/21  Theadora Rama Scales, PA-C  diclofenac (VOLTAREN) 75 MG EC tablet TAKE 1 TABLET(75 MG) BY MOUTH TWICE DAILY 09/15/21   [name], Azucena Fallen, MD  fluconazole (DIFLUCAN) 150 MG tablet Take 1 tablet (150 mg total) by mouth daily. -For your yeast infection, start the Diflucan (fluconazole)- Take one pill today (day 1). If you're still having symptoms in 3 days, take the second pill. 11/07/21   Rhys Martini, PA-C  fluticasone (FLONASE) 50 MCG/ACT nasal spray Place 2 sprays into both nostrils daily. 07/04/21   Theadora Rama Scales, PA-C  hydrOXYzine (ATARAX) 25 MG tablet Take 1 tablet (25 mg total) by mouth every 8 (eight) hours as needed for anxiety. 10/11/21   Hoy Register, MD  ipratropium (ATROVENT) 0.06 % nasal spray Place 2 sprays into both nostrils 4 (four) times daily. As needed for nasal congestion, runny nose 07/04/21   Theadora Rama Scales, PA-C  losartan (COZAAR) 100 MG tablet TAKE 1 TABLET(100 MG) BY MOUTH DAILY 09/26/21  Hoy Register, MD  metroNIDAZOLE (FLAGYL) 500 MG tablet Take 1 tablet (500 mg total) by mouth 2 (two) times daily. 11/07/21   Rhys Martini, PA-C  omeprazole (PRILOSEC) 40 MG capsule Take 1 capsule (40 mg total) by mouth daily. 09/15/21   Marcine Matar, MD  promethazine-dextromethorphan (PROMETHAZINE-DM) 6.25-15 MG/5ML syrup Take 5-10 mLs by mouth 4 (four) times daily as needed. 11/01/21   [name], PA-C  sertraline (ZOLOFT) 50 MG tablet Take 50 mg by mouth daily. 11/07/21   [provider]  traZODone (DESYREL) 50 MG tablet Take 0.5-1 tablets (25-50 mg total) by mouth at bedtime as needed for sleep.  09/14/21   Anders Simmonds, PA-C  medroxyPROGESTERone (PROVERA) 5 MG tablet Take 2 tablets (10 mg total) by mouth daily. Patient not taking: Reported on 07/20/2018 06/30/18 12/06/18  Geoffery Lyons, MD      Allergies    Prednisone    Review of Systems   Review of Systems  Gastrointestinal:  Positive for abdominal pain.  Genitourinary:  Positive for hematuria.  Musculoskeletal:  Positive for back pain.    Physical Exam Updated Vital Signs BP (!) 149/98   Pulse 84   Temp 98.5 F (36.9 C) (Oral)   Resp 18   Ht 5\' 9"  (1.753 m)   Wt 136.1 kg   SpO2 96%   BMI 44.30 kg/m  Physical Exam Vitals and nursing note reviewed.  HENT:     Head: Normocephalic.  Cardiovascular:     Rate and Rhythm: Normal rate.  Pulmonary:     Breath sounds: Normal breath sounds.  Abdominal:     Tenderness: There is abdominal tenderness.     Hernia: No hernia is present.     Comments: Epigastric tenderness.  No rebound or guarding.  Mild lower abdominal tenderness also without rebound or guarding.  Genitourinary:    Comments: Left flank tenderness posteriorly and somewhat inferiorly. Skin:    General: Skin is warm.  Neurological:     Mental Status: She is alert.     ED Results / Procedures / Treatments   Labs (all labs ordered are listed, but only abnormal results are displayed) Labs Reviewed  COMPREHENSIVE METABOLIC PANEL - Abnormal; Notable for the following components:      Result Value   Glucose, Bld 104 (*)    Calcium 8.7 (*)    AST 14 (*)    All other components within normal limits  LACTIC ACID, PLASMA  CBC WITH DIFFERENTIAL/PLATELET  LACTIC ACID, PLASMA    EKG None  Radiology CT ABDOMEN PELVIS W CONTRAST  Result Date: 11/15/2021 CLINICAL DATA:  Acute nonlocalized abdominal pain. Back pain and abdominal pain starting 2 weeks ago. EXAM: CT ABDOMEN AND PELVIS WITH CONTRAST TECHNIQUE: Multidetector CT imaging of the abdomen and pelvis was performed using the standard protocol  following bolus administration of intravenous contrast. RADIATION DOSE REDUCTION: This exam was performed according to the departmental dose-optimization program which includes automated exposure control, adjustment of the mA and/or kV according to patient size and/or use of iterative reconstruction technique. CONTRAST:  11/17/2021 OMNIPAQUE IOHEXOL 300 MG/ML  SOLN COMPARISON:  None Available. FINDINGS: Lower chest: Lung bases are clear. Hepatobiliary: No focal liver abnormality is seen. No gallstones, gallbladder wall thickening, or biliary dilatation. Pancreas: Unremarkable. No pancreatic ductal dilatation or surrounding inflammatory changes. Spleen: Normal in size without focal abnormality. Adrenals/Urinary Tract: Small calculus in the left renal pelvis measuring 2 mm diameter. No hydronephrosis or hydroureter. Nephrograms are symmetrical. No adrenal  gland nodules. Bladder is normal. Stomach/Bowel: Stomach is within normal limits. Appendix appears normal. No evidence of bowel wall thickening, distention, or inflammatory changes. Vascular/Lymphatic: No significant vascular findings are present. No enlarged abdominal or pelvic lymph nodes. Reproductive: Uterus and bilateral adnexa are unremarkable. Other: No abdominal wall hernia or abnormality. No abdominopelvic ascites. Musculoskeletal: No acute or significant osseous findings. IMPRESSION: 1. No evidence of bowel obstruction or inflammation. Appendix is normal. 2. Nonobstructing 3 mm stone in the left kidney. Electronically Signed   By: Burman Nieves M.D.   On: 11/15/2021 23:19    Procedures Procedures    Medications Ordered in ED Medications  iohexol (OMNIPAQUE) 300 MG/ML solution 100 mL (100 mLs Intravenous Contrast Given 11/15/21 2300)    ED Course/ Medical Decision Making/ A&P                           Medical Decision Making Amount and/or Complexity of Data Reviewed Labs: ordered. Radiology: ordered.  Risk Prescription drug  management.   Patient with abdominal pain.  Somewhat diffuse and sometimes in the upper and sometimes in the lower.  Sometimes left flank.  Had hematuria on previous urine but no other signs of infection.  Had recent pelvic exam with negative work-up for that.  Found to have tenderness today.  Lab work done and reassuring.  Has normal lactic acid and reassuring CBC.  CT scan done and independently interpreted by me.  Reassuring but does have a small stone in the left renal pelvis.  Potentially could be doing some pain that time if it is intermittently obstructing and also could be doing some of the hematuria.  Do not think there is infection at this time however.  Pain controlled overall.  Will have follow-up with PCP as needed.  Does not appear to require admission to the hospital.  Will discharge home.        Final Clinical Impression(s) / ED Diagnoses Final diagnoses:  Generalized abdominal pain  Hematuria, unspecified type  Kidney stone    Rx / DC Orders ED Discharge Orders     None         Benjiman Core, MD 11/15/21 2346

## 2021-11-15 NOTE — Progress Notes (Signed)
Based on what you shared with me, I feel your condition warrants further evaluation as soon as possible at an Emergency department. Unfortunately, kidney stones need to be evaluated in person with imaging to confirm diagnosis and make sure they are not blocking/you will be able to pass it without medical assistance.    NOTE: There will be NO CHARGE for this eVisit   If you are having a true medical emergency please call 911.      Emergency Department-[facility]  Get Driving Directions  [phone]  [address]  [place], [place] [address]  Open 24/7/365      [facility] Emergency Department at [facility]  Get Driving Directions  [address]  [place], [place] [address]  Open 24/7/365    Emergency Department- [facility]  Get Driving Directions  [phone]  [address]  [place], [place] [address]  Open 24/7/365      [facility] at [facility]  Get Driving Directions  [phone]  [address]  [place], [place] [address]  Open 24/7/365    [facility]  Emergency Department- [facility]  Get Driving Directions  [phone]  [address]  [place], [place] [address]  Open 24/7/365    [place]  Emergency Department- [facility]  Get Driving Directions  [address]  [place], [place] [address]  Open 24/7/365    [facility]  Emergency Department- [facility]  Get Driving Directions  [phone]  [address]  [place], [place] [address]  Open 24/7/365    I provided 5 minutes of non face-to-face time during this encounter for chart review and documentation.

## 2021-11-15 NOTE — ED Triage Notes (Signed)
Pt c/o back pain and abdominal pain starting 2 weeks ago. Pt states pain has gotten worse in the last week. Pt states she went to UC 1 week ago for pain and hematuria, was given an antibiotic. Pt did not finish antibiotic course. Pt states when she wipes after urinating there is blood and white discharge.

## 2021-11-16 ENCOUNTER — Other Ambulatory Visit: Payer: Self-pay | Admitting: Licensed Clinical Social Worker

## 2021-11-16 NOTE — Patient Instructions (Signed)
Visit Information  Ann Moran was given information about Medicaid Managed Care team care coordination services as a part of their [place] Medicaid benefit. Ann Moran verbally consentedto engagement with the [facility] Managed Care team.   If you are experiencing a medical emergency, please call 911 or report to your local emergency department or urgent care.   If you have a non-emergency medical problem during routine business hours, please contact your provider's office and ask to speak with a nurse.   For questions related to your [facility] health plan, please call: [phone]  OR visit the member homepage at: [email or web address]  If you would like to schedule transportation through your [facility] plan, please call the following number at least 2 days in advance of your appointment: [phone]  If you are experiencing a behavioral health crisis, call the [place] at [phone]). The line is available 24 hours a day, seven days a week.  If you would like help to quit smoking, call 1-800-QUIT-NOW (414)243-6864) OR Espaol: 1-855-Djelo-Ya (1-224-825-0037) o para ms informacin haga clic aqu or Text READY to 200-400 to register via text   Following is a copy of your plan of care:  Care Plan : LCSW Plan of Care  Updates made by Ann Cutter, LCSW since 11/16/2021 12:00 AM     Problem: Coping Skills (General Plan of Care)      Long-Range Goal: I need mental health support   Start Date: 09/05/2021  Priority: High  Note:   Priority: High  Timeframe:  Long-Range Goal Priority:  High Start Date:   09/05/21            Expected End Date:  ongoing                Follow Up Date--12/20/21 at 2:30 pm.   - check out counseling - keep 90 percent of counseling appointments - schedule counseling appointment    Why  is this important?             Beating depression may take some time.            If you don't feel better right away, don't give up on your treatment plan.    Current barriers:   Chronic Mental Health needs related to depression, insomnia, stress and anxiety. Patient requires Support, Education, Resources, Referrals, Advocacy, and Care Coordination, in order to meet Unmet Mental Health Needs. Patient will implement clinical interventions discussed today to decrease symptoms of depression and increase knowledge and/or ability of: coping skills. Mental Health Concerns and Social Isolation Patient lacks knowledge of available community counseling agencies and resources.  Clinical Goal(s): verbalize understanding of plan for management of Anxiety, Depression, Insomnia and Stress and demonstrate a reduction in symptoms. Patient will connect with a provider for ongoing mental health treatment, increase coping skills, healthy habits, self-management skills, and stress reduction        Patient Goals/Self-Care Activities: Over the next 120 days Attend scheduled medical appointments Utilize healthy coping skills and supportive resources discussed Contact PCP with any questions or concerns Keep 90 percent of counseling appointments Call your insurance provider for more information about your Enhanced Benefits  Check out counseling resources provided  Begin personal counseling with LCSW, to reduce and manage symptoms of Depression and Stress, until well-established with mental health provider Accept all calls from representative with [facility] in an effort to establish ongoing mental health counseling and supportive services.  Incorporate into daily practice - relaxation techniques, deep breathing exercises, and mindfulness meditation strategies. Talk about feelings with friends, family members, spiritual advisor, etc. Contact LCSW directly [phone]), if you have questions, need assistance, or if  additional social work needs are identified between now and our next scheduled telephone outreach call. Call 988 for mental health hotline/crisis line if needed (24/7 available) Try techniques to reduce symptoms of anxiety/negative thinking (deep breathing, distraction, positive self talk, etc)  - develop a personal safety plan - develop a plan to deal with triggers like holidays, anniversaries - exercise at least 2 to 3 times per week - have a plan for how to handle bad days - journal feelings and what helps to feel better or worse - spend time or talk with others at least 2 to 3 times per week - watch for early signs of feeling worse - begin personal counseling - call and visit an old friend - check out volunteer opportunities - join a support group - laugh; watch a funny movie or comedian - learn and use visualization or guided imagery - perform a random act of kindness - practice relaxation or meditation daily - start or continue a personal journal - practice positive thinking and self-talk -continue with compliance of taking medication  -identify current effective and ineffective coping strategies.  -implement positive self-talk in care to increase self-esteem, confidence and feelings of control.  -consider alternative and complementary therapy approaches such as meditation, mindfulness or yoga.  -journaling, prayer, worship services, meditation or pastoral counseling.  -increase participation in pleasurable group activities such as hobbies, singing, sports or volunteering).  -consider the use of meditative movement therapy such as tai chi, yoga or qigong.  -start a regular daily exercise program based on tolerance, ability and patient choice to support positive thinking and activity        24- Hour Availability:    [facility]  [address] [place], [place] Crisis [phone]   [facility] [employer]  [place]  [phone]    [place]  [phone] (after hours)   Therapeutic Alternative/Mobile Crisis   [phone]   [place] National Suicide Hotline  805-696-7257 (TALK) OR 988   Call 911 or go to emergency room   [facility]  [phone]);  [place] and [employer]  [phone]); [place], [place], [place], [place], [place], [place], [place]        10 LITTLE Things To Do When You're Feeling Too Down To Do Anything  Take a shower. Even if you plan to stay in all day long and not see a soul, take a shower. It takes the most effort to hop in to the shower but once you do, you'll feel immediate results. It will wake you up and you'll be feeling much fresher (and cleaner too).  Brush and floss your teeth. Give your teeth a good brushing with a floss finish. It's a small task but it feels so good and you can check 'taking care of your health' off the list of things to do.  Do something small on your list. Most of [place] have some small thing we would like to get done (load of laundry, sew a button, email a friend). Doing one of these things will make you feel like you've accomplished something.  Drink water. Drinking water is easy right? It's also really beneficial for your health so keep a  glass beside you all day and take sips often. It gives you energy and prevents you from boredom eating.  Do some floor exercises. The last thing you want to do is exercise but it might be just the thing you need the most. Keep it simple and do exercises that involve sitting or laying on the floor. Even the smallest of exercises release chemicals in the brain that make you feel good. Yoga stretches or core exercises are going to make you feel good with minimal effort.  Make your bed. Making your bed takes a few minutes but it's productive and you'll feel relieved when it's done. An unmade bed is a huge visual  reminder that you're having an unproductive day. Do it and consider it your housework for the day.  Put on some nice clothes. Take the sweatpants off even if you don't plan to go anywhere. Put on clothes that make you feel good. Take a look in the mirror so your brain recognizes the sweatpants have been replaced with clothes that make you look great. It's an instant confidence booster.  Wash the dishes. A pile of dirty dishes in the sink is a reflection of your mood. It's possible that if you wash up the dishes, your mood will follow suit. It's worth a try.  Cook a real meal. If you have the luxury to have a "do nothing" day, you have time to make a real meal for yourself. Make a meal that you love to eat. The process is good to get you out of the funk and the food will ensure you have more energy for tomorrow.  Write out your thoughts by hand. When you hand write, you stimulate your brain to focus on the moment that you're in so make yourself comfortable and write whatever comes into your mind. Put those thoughts out on paper so they stop spinning around in your head. Those thoughts might be the very thing holding you down.   Patient Goals: Follow up goal  Ann Moran, BSW, MSW, [employer] Managed Medicaid LCSW [email or web address] Phone: [phone]

## 2021-11-16 NOTE — Patient Outreach (Signed)
Medicaid Managed Care Social Work Note  11/16/2021 Name:  Ann Moran MRN:  [id number] DOB:  26-Jun-1985  Ann Moran is an 36 y.o. year old female who is a primary patient of [name], MD.  The Medicaid Managed Care Coordination team was consulted for assistance with:  [place] and Resources  Ms. Woodhead was given information about Medicaid Managed Care Coordination team services today. [name] Patient agreed to services and verbal consent obtained.  Engaged with patient  for by telephone forfollow up visit in response to referral for case management and/or care coordination services.   Assessments/Interventions:  Review of past medical history, allergies, medications, health status, including review of consultants reports, laboratory and other test data, was performed as part of comprehensive evaluation and provision of chronic care management services.  SDOH: (Social Determinant of Health) assessments and interventions performed: SDOH Interventions    Flowsheet Row Most Recent Value  SDOH Interventions   Stress Interventions Offered [employer], Provide Counseling       Advanced Directives Status:  See Care Plan for related entries.  Care Plan                 Allergies  Allergen Reactions   Prednisone Swelling    Had neck and shoulder swelling following taking    Medications Reviewed Today     Reviewed by Greg Cutter, LCSW (Social Worker) on 11/16/21 at 1438  Med List Status: <None>   Medication Order Taking? Sig Documenting Provider Last Dose Status Informant  albuterol (VENTOLIN HFA) 108 (90 Base) MCG/ACT inhaler [id number] No Inhale 2 puffs into the lungs every 6 (six) hours as needed for wheezing or shortness of breath (Cough). [name], PA-C Taking Active   amLODipine (NORVASC) 10 MG tablet [id number] No TAKE 1 TABLET(10 MG) BY MOUTH DAILY [name], MD Taking Active   benzonatate (TESSALON) 100  MG capsule [id number]  Take 1 capsule (100 mg total) by mouth 3 (three) times daily as needed. Mar Daring, PA-C  Active   Blood Pressure Monitoring (BLOOD PRESSURE CUFF) MISC [id number] No Use to check blood pressure once daily. [name], MD Taking Active   busPIRone (BUSPAR) 15 MG tablet [id number] No Take 1 tablet (15 mg total) by mouth 2 (two) times daily. Freeman Caldron M, PA-C Taking Active   cetirizine (ZYRTEC ALLERGY) 10 MG tablet [id number] No Take 1 tablet (10 mg total) by mouth at bedtime. [name], PA-C Taking Active   diclofenac (VOLTAREN) 75 MG EC tablet [id number] No TAKE 1 TABLET(75 MG) BY MOUTH TWICE DAILY [name], [name], MD Taking Active   fluconazole (DIFLUCAN) 150 MG tablet [id number]  Take 1 tablet (150 mg total) by mouth daily. -For your yeast infection, start the Diflucan (fluconazole)- Take one pill today (day 1). If you're still having symptoms in 3 days, take the second pill. Hazel Sams, PA-C  Active   fluticasone [facility]) 50 MCG/ACT nasal spray [id number] No Place 2 sprays into both nostrils daily. [name], PA-C Taking Active   hydrOXYzine (ATARAX) 25 MG tablet [id number] No Take 1 tablet (25 mg total) by mouth every 8 (eight) hours as needed for anxiety. [name], MD Taking Active   ipratropium (ATROVENT) 0.06 % nasal spray [id number] No Place 2 sprays into both nostrils 4 (four) times daily. As needed for nasal congestion, runny nose [name], PA-C Taking Active   losartan (COZAAR) 100 MG tablet [id number] No TAKE 1  TABLET(100 MG) BY MOUTH DAILY [name], MD Taking Active   Patient not taking:  Discontinued 12/06/18 0950 metroNIDAZOLE (FLAGYL) 500 MG tablet [id number]  Take 1 tablet (500 mg total) by mouth 2 (two) times daily. Hazel Sams, PA-C  Active   omeprazole (PRILOSEC) 40 MG capsule [id number] No Take 1 capsule (40 mg total) by mouth daily. [name], MD Taking Active    promethazine-dextromethorphan (PROMETHAZINE-DM) 6.25-15 MG/5ML syrup [id number]  Take 5-10 mLs by mouth 4 (four) times daily as needed. [name] M, PA-C  Active   sertraline (ZOLOFT) 50 MG tablet [id number]  Take 50 mg by mouth daily. [provider]  Active   traZODone (DESYREL) 50 MG tablet [id number] No Take 0.5-1 tablets (25-50 mg total) by mouth at bedtime as needed for sleep. [name], [place] Taking Active             Patient Active Problem List   Diagnosis Date Noted   Smoking 02/23/2021   Patellar tendinitis of both knees 01/27/2021   Pain in joint involving right ankle and foot 01/27/2021   Thoracic back pain 12/23/2020   Right elbow pain 12/23/2020   Chronic pain of right knee 12/23/2020   Ankle fracture, left 08/09/2020   Dysphagia 05/20/2019   Encounter for other preprocedural examination 05/20/2019   PCOS (polycystic ovarian syndrome) 02/24/2019   Neck pain 02/24/2019   Morbid obesity with body mass index (BMI) of 45.0 to 49.9 in adult [facility]) 02/24/2019   Essential hypertension 02/24/2019   Gastroesophageal reflux disease 02/24/2019   Irregular menstrual cycle 01/14/2019   Abnormal uterine bleeding 01/14/2019   Chronic cough 01/14/2019    Conditions to be addressed/monitored per PCP order:  Anxiety and Depression  Care Plan : LCSW Plan of Care  Updates made by Greg Cutter, LCSW since 11/16/2021 12:00 AM     Problem: Coping Skills (General Plan of Care)      Long-Range Goal: I need mental health support   Start Date: 09/05/2021  Priority: High  Note:   Priority: High  Timeframe:  Long-Range Goal Priority:  High Start Date:   09/05/21            Expected End Date:  ongoing                Follow Up Date--12/20/21 at 2:30 pm.   - check out counseling - keep 90 percent of counseling appointments - schedule counseling appointment    Why is this important?             Beating depression may take some time.            If you  don't feel better right away, don't give up on your treatment plan.    Current barriers:   Chronic Mental Health needs related to depression, insomnia, stress and anxiety. Patient requires Support, Education, Resources, Referrals, Advocacy, and Care Coordination, in order to meet Unmet Mental Health Needs. Patient will implement clinical interventions discussed today to decrease symptoms of depression and increase knowledge and/or ability of: coping skills. Mental Health Concerns and Social Isolation Patient lacks knowledge of available community counseling agencies and resources.  Clinical Goal(s): verbalize understanding of plan for management of Anxiety, Depression, Insomnia and Stress and demonstrate a reduction in symptoms. Patient will connect with a provider for ongoing mental health treatment, increase coping skills, healthy habits, self-management skills, and stress reduction        Clinical Interventions:  Assessed patient's previous and  current treatment, coping skills, support system and barriers to care. Patient and spouse provided hx  Verbalization of feelings encouraged, motivational interviewing employed Emotional support provided, positive coping strategies explored Self care/establishing healthy boundaries emphasized Patient reports that she has anxiety, depression and insomnia. She reports that she is not sleeping which makes her feel worse. Patient reports significant worsening anxiety, depression and insomnia impacting their ability to function appropriately and carry out daily task. Patient has NO support network. She and her 40 yo daughter live together.  Patient is agreeable to referral to [facility] for counseling and psychiatry. [facility] LCSW made referral on 09/05/21. However, patient is in need of immediate services and assistance and was encouraged to consider their walk in clinic. She is agreeable to go tomorrow but will have to take her daughter into school later because of it.  [facility] LCSW completed joint phone call to [place] for [place] with patient. [facility] [place] only has an inpatient unit available within [facility]. [facility] LCSW and patient left a [facility] with the [facility] to inquire if they would accept patient's insurance for medication management.  LCSW provided education on relaxation techniques such as meditation, deep breathing, massage, grounding exercises or yoga that can activate the body's relaxation response and ease symptoms of stress and anxiety. LCSW ask that when pt is struggling with difficult emotions and racing thoughts that they start this relaxation response process. LCSW provided extensive education on healthy coping skills for anxiety. SW used active and reflective listening, validated patient's feelings/concerns, and provided emotional support. Patient will work on implementing appropriate self-care habits into their daily routine such as: staying positive, writing a gratitude list, drinking water, staying active around the house, combating negative thoughts or emotions and staying connected.  Patient denies any SI/HI. [facility] LCSW sent patient an email with mental health service resources within her area that accept her insurance on 09/05/21.  LCSW provided education on healthy sleep hygiene and what that looks like. LCSW encouraged patient to implement a night time routine into their schedule that works best for them and that they are able to maintain. Advised patient to implement deep breathing/grounding/meditation/self-care exercises into their nightly routine to combat racing thoughts at night. LCSW encouraged patient to wake up at the same time each day, make their sleeping environment comfortable, exercise when able, to limit naps and to not eat or drink anything right before bed. UPDATE 10/03/21- Patient is wanting to enroll into [facility] mental health program. Referral has been made for both psychiatry and counseling but  patient has not received a call yet from agency. [facility] LCSW offered to complete a joint call to agency to get her appointments scheduled but patient prefers to utilize their walk in clinic as their wait list is several months out at this time. [facility] LCSW sent patient an additional email with their walk in hour information. Patient desired to gain information on how to apply for disability. [facility] LCSW provided education and advised to her to go to her local social security office. [facility] LCSW sent her resources on how to apply for disability through email as well. Patient is agreeable to 30 day follow up next month to ensure that she went to walk in clinic and was established with a long term mental health provider.  UPDATE 11/02/21- Patient reports that she has been taking care of her sick father and then she got sick and had to go to the ED which caused her to not  be able to go to the [facility] walk in clinic. Patient reports that she still wants psychiatry and counseling. [facility] LCSW and patient completed joint phone call to [facility] and was able to get her scheduled for both psychiatry and counseling on 11/09/21 at 8:00 am. Patient will receive a discharge follow up call on 11/16/21 to ensure that all of her mental health needs have been met.  UPDATE 11/16/21-Patient had a recent ED visit for kidney stones and wishes to gain nursing assistance. Referral made to [place]. Patient was a no show for her scheduled mental health appointment at [facility]. Patient reports that she had transportation issues that day. She has agreed to go to [facility] walk in clinic next Wednesday. Email sent to patient today with reminders and plan of care. Patient will contact her PCP as well for a ED follow up appointment.   Motivational Interviewing employed Depression screen reviewed  PHQ2/ PHQ9 completed Mindfulness or Relaxation training provided Active listening / Reflection utilized  Advance Care and HCPOA education provided Emotional Support  Provided Problem [place] strategies reviewed Provided psychoeducation for mental health needs  Provided brief CBT  Reviewed mental health medications and discussed importance of compliance:  Quality of sleep assessed & Sleep Hygiene techniques promoted  Participation in counseling encouraged  Verbalization of feelings encouraged  Suicidal Ideation/Homicidal Ideation assessed: Patient denies SI/HI  Review resources, discussed options and provided patient information about  [facility] care team collaboration (see longitudinal plan of care) Patient Goals/Self-Care Activities: Over the next 120 days Attend scheduled medical appointments Utilize healthy coping skills and supportive resources discussed Contact PCP with any questions or concerns Keep 90 percent of counseling appointments Call your insurance provider for more information about your Enhanced Benefits  Check out counseling resources provided  Begin personal counseling with LCSW, to reduce and manage symptoms of Depression and Stress, until well-established with mental health provider Accept all calls from representative with [facility] in an effort to establish ongoing mental health counseling and supportive services. Incorporate into daily practice - relaxation techniques, deep breathing exercises, and mindfulness meditation strategies. Talk about feelings with friends, family members, spiritual advisor, etc. Contact LCSW directly [phone]), if you have questions, need assistance, or if additional social work needs are identified between now and our next scheduled telephone outreach call. Call [phone] for mental health hotline/crisis line if needed (24/7 available) Try techniques to reduce symptoms of anxiety/negative thinking (deep breathing, distraction, positive self talk, etc)  - develop a personal safety plan - develop a plan to deal with triggers like holidays, anniversaries -  exercise at least 2 to 3 times per week - have a plan for how to handle bad days - journal feelings and what helps to feel better or worse - spend time or talk with others at least 2 to 3 times per week - watch for early signs of feeling worse - begin personal counseling - call and visit an old friend - check out volunteer opportunities - join a support group - laugh; watch a funny movie or comedian - learn and use visualization or guided imagery - perform a random act of kindness - practice relaxation or meditation daily - start or continue a personal journal - practice positive thinking and self-talk -continue with compliance of taking medication  -identify current effective and ineffective coping strategies.  -implement positive self-talk in care to increase self-esteem, confidence and feelings of control.  -consider alternative and complementary therapy approaches such as meditation, mindfulness  or yoga.  -journaling, prayer, worship services, meditation or pastoral counseling.  -increase participation in pleasurable group activities such as hobbies, singing, sports or volunteering).  -consider the use of meditative movement therapy such as tai chi, yoga or qigong.  -start a regular daily exercise program based on tolerance, ability and patient choice to support positive thinking and activity        24- Hour Availability:    [facility]  [address] [place], [place] Crisis [phone]   [facility] [employer] [place]  [phone]    [place]  [phone] (after hours)   Therapeutic Alternative/Mobile Crisis   [phone]   [place] National Suicide Hotline  506-240-0316 (TALK) OR 988   Call 911 or go to emergency room   [facility]  [phone]);  [place] and [employer]  [phone]); [place], [place],  [place], [place], [place], [place], [place]        10 LITTLE Things To Do When You're Feeling Too Down To Do Anything  Take a shower. Even if you plan to stay in all day long and not see a soul, take a shower. It takes the most effort to hop in to the shower but once you do, you'll feel immediate results. It will wake you up and you'll be feeling much fresher (and cleaner too).  Brush and floss your teeth. Give your teeth a good brushing with a floss finish. It's a small task but it feels so good and you can check 'taking care of your health' off the list of things to do.  Do something small on your list. Most of [place] have some small thing we would like to get done (load of laundry, sew a button, email a friend). Doing one of these things will make you feel like you've accomplished something.  Drink water. Drinking water is easy right? It's also really beneficial for your health so keep a glass beside you all day and take sips often. It gives you energy and prevents you from boredom eating.  Do some floor exercises. The last thing you want to do is exercise but it might be just the thing you need the most. Keep it simple and do exercises that involve sitting or laying on the floor. Even the smallest of exercises release chemicals in the brain that make you feel good. Yoga stretches or core exercises are going to make you feel good with minimal effort.  Make your bed. Making your bed takes a few minutes but it's productive and you'll feel relieved when it's done. An unmade bed is a huge visual reminder that you're having an unproductive day. Do it and consider it your housework for the day.  Put on some nice clothes. Take the sweatpants off even if you don't plan to go anywhere. Put on clothes that make you feel good. Take a look in the mirror so your brain recognizes the sweatpants have been replaced with clothes that make you look great. It's an instant confidence booster.  Wash the  dishes. A pile of dirty dishes in the sink is a reflection of your mood. It's possible that if you wash up the dishes, your mood will follow suit. It's worth a try.  Cook a real meal. If you have the luxury to have a "do nothing" day, you have time to make a real meal for yourself. Make a meal  that you love to eat. The process is good to get you out of the funk and the food will ensure you have more energy for tomorrow.  Write out your thoughts by hand. When you hand write, you stimulate your brain to focus on the moment that you're in so make yourself comfortable and write whatever comes into your mind. Put those thoughts out on paper so they stop spinning around in your head. Those thoughts might be the very thing holding you down.   Patient Goals: Follow up goal     Follow up:  Patient agrees to Care Plan and Follow-up.  Plan: The Managed Medicaid care management team will reach out to the patient again over the next 45 days.  Date/time of next scheduled Social Work care management/care coordination outreach:  12/20/21 at 2:30 pm.   Eula Fried, BSW, MSW, [place] Medicaid LCSW [email or web address] Phone: [phone]

## 2021-11-22 ENCOUNTER — Ambulatory Visit: Payer: Self-pay

## 2021-11-27 ENCOUNTER — Other Ambulatory Visit: Payer: Self-pay | Admitting: Family Medicine

## 2021-11-27 DIAGNOSIS — F419 Anxiety disorder, unspecified: Secondary | ICD-10-CM

## 2021-11-28 NOTE — Telephone Encounter (Signed)
Requested Prescriptions  Pending Prescriptions Disp Refills  . hydrOXYzine (ATARAX) 25 MG tablet [Pharmacy Med Name: HYDROXYZINE HCL 25MG  TABS (WHITE)] 60 tablet 0    Sig: TAKE 1 TABLET(25 MG) BY MOUTH EVERY 8 HOURS AS NEEDED FOR ANXIETY     Ear, Nose, and Throat:  Antihistamines 2 Passed - 11/27/2021  2:02 PM      Passed - Cr in normal range and within 360 days    Creatinine, Ser  Date Value Ref Range Status  11/15/2021 0.75 0.44 - 1.00 mg/dL Final         Passed - Valid encounter within last 12 months    Recent Outpatient Visits          2 months ago Grief reaction   [facility] [place], [place], [place]   7 months ago Exposure to pneumonia   [facility] [place], [place], NP   9 months ago Screening examination for STD (sexually transmitted disease)   [facility] [place], [place], [place]   1 year ago Anxiety and depression   [facility] [place], [place], MD   1 year ago Sore throat   [facility], [facility], Kasandra Knudsen      Future Appointments            In 2 weeks [place], Lois Huxley, RPH-CPP [facility]   In 1 month [facility], Delford Field, MD [facility]

## 2021-12-04 ENCOUNTER — Other Ambulatory Visit: Payer: Self-pay | Admitting: Physician Assistant

## 2021-12-12 ENCOUNTER — Ambulatory Visit: Payer: Medicaid Other | Admitting: Pharmacist

## 2021-12-12 ENCOUNTER — Encounter: Payer: Self-pay | Admitting: Pharmacist

## 2021-12-12 ENCOUNTER — Ambulatory Visit: Payer: Medicaid Other | Attending: Family Medicine | Admitting: Pharmacist

## 2021-12-12 VITALS — BP 129/85 | HR 84

## 2021-12-12 DIAGNOSIS — I10 Essential (primary) hypertension: Secondary | ICD-10-CM

## 2021-12-12 NOTE — Progress Notes (Signed)
   S:     No chief complaint on file.  Ann Moran is a 36 y.o. female who presents for hypertension evaluation, education, and management. PMH is significant for HTN, GERD< PCOD, obesity. It doesn't look like she's been seen by one of our PCPs consistently. She's been seen most recently by [facility] via video visit 09/14/2021. She was identified by one of our CPPs for failing the [employer] of BP control 09/29/2021.   Today, patient arrives in good spirits and presents without assistance. Denies dizziness, headache, blurred vision, swelling.   Patient reports hypertension was diagnosed 2 years.   Family/Social history:  -Fhx: HTN, DM, asthma, stroke -Tobacco: current 0.5 PPD smoker  -Alcohol: none reported   Medication adherence is suboptimal. Patient has not taken BP medications today and admits to missing several doses over the last week.   Current antihypertensives include: amlodipine 10 mg daily, losartan 100 mg daily   Reported home BP readings: none  Patient reported dietary habits: -In the process of cutting out sodium. Has purchased and is cooking with sodium-free seasonings.  -Drinks coffee but no excessive caffeine intake reported   Patient-reported exercise habits: none   O:  Vitals:   12/12/21 1600  BP: 129/85  Pulse: 84   Last 3 Office BP readings: BP Readings from Last 3 Encounters:  12/12/21 129/85  11/16/21 136/90  11/07/21 (!) 151/99    BMET    Component Value Date/Time   NA 140 11/15/2021 2205   NA 142 02/23/2021 1009   K 3.6 11/15/2021 2205   CL 108 11/15/2021 2205   CO2 26 11/15/2021 2205   GLUCOSE 104 (H) 11/15/2021 2205   BUN 7 11/15/2021 2205   BUN 6 02/23/2021 1009   CREATININE 0.75 11/15/2021 2205   CALCIUM 8.7 (L) 11/15/2021 2205   GFRNONAA >60 11/15/2021 2205   GFRAA >60 11/11/2019 1124    Renal function: CrCl cannot be calculated (Patient's most recent lab result is older than the maximum 21 days allowed.).  Clinical  ASCVD: No  The ASCVD Risk score (Arnett DK, et al., 2019) failed to calculate for the following reasons:   The 2019 ASCVD risk score is only valid for ages 73 to 47  Patient is participating in a Managed Medicaid Plan:  Yes    A/P: Hypertension diagnosed ~2 years ago currently above goal on current medications. BP goal < 130/80 mmHg. Medication adherence appears suboptimal. Encouraged medication adherence and increasing physical inactivity.  -Continued current regimen.  -F/u labs ordered - none -Counseled on lifestyle modifications for blood pressure control including reduced dietary sodium, increased exercise, adequate sleep. -Encouraged patient to check BP at home and bring log of readings to next visit. Counseled on proper use of home BP cuff.    Results reviewed and written information provided.    Written patient instructions provided. Patient verbalized understanding of treatment plan.  Total time in face to face counseling 20 minutes.    Follow-up:  Pharmacist prn. PCP clinic visit next month.  Butch Penny, PharmD, [place]y [place], CPP Clinical Pharmacist [facility] [phone]

## 2021-12-15 ENCOUNTER — Telehealth: Payer: Medicaid Other | Admitting: Physician Assistant

## 2021-12-15 DIAGNOSIS — A084 Viral intestinal infection, unspecified: Secondary | ICD-10-CM | POA: Diagnosis not present

## 2021-12-15 MED ORDER — ONDANSETRON 4 MG PO TBDP: 4.0000 mg | ORAL_TABLET | Freq: Three times a day (TID) | ORAL | 0 refills | Status: DC | PRN

## 2021-12-15 NOTE — Progress Notes (Signed)
E-Visit for Nausea and Vomiting   We are sorry that you are not feeling well. Here is how we plan to help!  Based on what you have shared with me it looks like you have a Virus that is irritating your GI tract.  Vomiting is the forceful emptying of a portion of the stomach's content through the mouth.  Although nausea and vomiting can make you feel miserable, it's important to remember that these are not diseases, but rather symptoms of an underlying illness.  When we treat short term symptoms, we always caution that any symptoms that persist should be fully evaluated in a medical office.  I have prescribed a medication that will help alleviate your symptoms and allow you to stay hydrated:  Zofran 4 mg 1 tablet every 8 hours as needed for nausea and vomiting  The urine symptoms may be secondary to mild dehydration from Vomiting. Make sure to push fluids, particularly electrolyte beverages.   HOME CARE: Drink clear liquids.  This is very important! Dehydration (the lack of fluid) can lead to a serious complication.  Start off with 1 tablespoon every 5 minutes for 8 hours. You may begin eating bland foods after 8 hours without vomiting.  Start with saltine crackers, white bread, rice, mashed potatoes, applesauce. After 48 hours on a bland diet, you may resume a normal diet. Try to go to sleep.  Sleep often empties the stomach and relieves the need to vomit.  GET HELP RIGHT AWAY IF:  Your symptoms do not improve or worsen within 2 days after treatment. You have a fever for over 3 days. You cannot keep down fluids after trying the medication.  MAKE SURE YOU:  Understand these instructions. Will watch your condition. Will get help right away if you are not doing well or get worse.    Thank you for choosing an e-visit.  Your e-visit answers were reviewed by a board certified advanced clinical practitioner to complete your personal care plan. Depending upon the condition, your plan could  have included both over the counter or prescription medications.  Please review your pharmacy choice. Make sure the pharmacy is open so you can pick up prescription now. If there is a problem, you may contact your provider through [employer] and have the prescription routed to another pharmacy.  Your safety is important to [place]. If you have drug allergies check your prescription carefully.   For the next 24 hours you can use MyChart to ask questions about today's visit, request a non-urgent call back, or ask for a work or school excuse. You will get an email in the next two days asking about your experience. I hope that your e-visit has been valuable and will speed your recovery.  I provided 5 minutes of non face-to-face time during this encounter for chart review and documentation.

## 2021-12-20 ENCOUNTER — Other Ambulatory Visit: Payer: Self-pay | Admitting: Licensed Clinical Social Worker

## 2021-12-21 NOTE — Patient Instructions (Signed)
Visit Information  Ann Moran was given information about Medicaid Managed Care team care coordination services as a part of their [place] Medicaid benefit. Ann Moran verbally consentedto engagement with the [facility] Managed Care team.   If you are experiencing a medical emergency, please call 911 or report to your local emergency department or urgent care.   If you have a non-emergency medical problem during routine business hours, please contact your provider's office and ask to speak with a nurse.   For questions related to your [facility] health plan, please call: [phone]  OR visit the member homepage at: [email or web address]  If you would like to schedule transportation through your [facility] plan, please call the following number at least 2 days in advance of your appointment: [phone]  If you are experiencing a behavioral health crisis, call the [place] at [phone]). The line is available 24 hours a day, seven days a week.  If you would like help to quit smoking, call 1-800-QUIT-NOW (469)545-6557) OR Espaol: 1-855-Djelo-Ya (0-254-270-6237) o para ms informacin haga clic aqu or Text READY to 200-400 to register via text  Following is a copy of your plan of care:  Care Plan : LCSW Plan of Care  Updates made by Greg Cutter, LCSW since 12/21/2021 12:00 AM     Problem: Coping Skills (General Plan of Care)      Long-Range Goal: I need mental health support   Start Date: 09/05/2021  Priority: High  Note:   Priority: High  Timeframe:  Long-Range Goal Priority:  High Start Date:   09/05/21            Expected End Date:  ongoing                Follow Up Date--01/01/22 at 1 pm  - check out counseling - keep 90 percent of counseling appointments - schedule counseling appointment    Why is this  important?             Beating depression may take some time.            If you don't feel better right away, don't give up on your treatment plan.    Current barriers:   Chronic Mental Health needs related to depression, insomnia, stress and anxiety. Patient requires Support, Education, Resources, Referrals, Advocacy, and Care Coordination, in order to meet Unmet Mental Health Needs. Patient will implement clinical interventions discussed today to decrease symptoms of depression and increase knowledge and/or ability of: coping skills. Mental Health Concerns and Social Isolation Patient lacks knowledge of available community counseling agencies and resources.  Clinical Goal(s): verbalize understanding of plan for management of Anxiety, Depression, Insomnia and Stress and demonstrate a reduction in symptoms. Patient will connect with a provider for ongoing mental health treatment, increase coping skills, healthy habits, self-management skills, and stress reduction       Patient Goals/Self-Care Activities: Over the next 120 days Attend scheduled medical appointments Utilize healthy coping skills and supportive resources discussed Contact PCP with any questions or concerns Keep 90 percent of counseling appointments Call your insurance provider for more information about your Enhanced Benefits  Check out counseling resources provided  Begin personal counseling with LCSW, to reduce and manage symptoms of Depression and Stress, until well-established with mental health provider Accept all calls from representative with [facility] in an effort to establish ongoing mental health counseling and supportive services. Incorporate into daily  practice - relaxation techniques, deep breathing exercises, and mindfulness meditation strategies. Talk about feelings with friends, family members, spiritual advisor, etc. Contact LCSW directly [phone]), if you have questions, need assistance, or if additional  social work needs are identified between now and our next scheduled telephone outreach call. Call 988 for mental health hotline/crisis line if needed (24/7 available) Try techniques to reduce symptoms of anxiety/negative thinking (deep breathing, distraction, positive self talk, etc)  - develop a personal safety plan - develop a plan to deal with triggers like holidays, anniversaries - exercise at least 2 to 3 times per week - have a plan for how to handle bad days - journal feelings and what helps to feel better or worse - spend time or talk with others at least 2 to 3 times per week - watch for early signs of feeling worse - begin personal counseling - call and visit an old friend - check out volunteer opportunities - join a support group - laugh; watch a funny movie or comedian - learn and use visualization or guided imagery - perform a random act of kindness - practice relaxation or meditation daily - start or continue a personal journal - practice positive thinking and self-talk -continue with compliance of taking medication  -identify current effective and ineffective coping strategies.  -implement positive self-talk in care to increase self-esteem, confidence and feelings of control.  -consider alternative and complementary therapy approaches such as meditation, mindfulness or yoga.  -journaling, prayer, worship services, meditation or pastoral counseling.  -increase participation in pleasurable group activities such as hobbies, singing, sports or volunteering).  -consider the use of meditative movement therapy such as tai chi, yoga or qigong.  -start a regular daily exercise program based on tolerance, ability and patient choice to support positive thinking and activity        24- Hour Availability:    [facility]  [address] [place], [place] [facility] [phone]   [facility] [employer] [place]  [phone]    [place]  [phone] (after hours)   Therapeutic Alternative/Mobile Crisis   [phone]   [place] National Suicide Hotline  2204374342 (TALK) OR 988   Call 911 or go to emergency room   [facility]  [phone]);  [place] and [employer]  [phone]); [place], [place], [place], [place], [place], [place], [place]        10 LITTLE Things To Do When You're Feeling Too Down To Do Anything  Take a shower. Even if you plan to stay in all day long and not see a soul, take a shower. It takes the most effort to hop in to the shower but once you do, you'll feel immediate results. It will wake you up and you'll be feeling much fresher (and cleaner too).  Brush and floss your teeth. Give your teeth a good brushing with a floss finish. It's a small task but it feels so good and you can check 'taking care of your health' off the list of things to do.  Do something small on your list. Most of [place] have some small thing we would like to get done (load of laundry, sew a button, email a friend). Doing one of these things will make you feel like you've accomplished something.  Drink water. Drinking water is easy right? It's also really beneficial for your health so keep a glass beside you  all day and take sips often. It gives you energy and prevents you from boredom eating.  Do some floor exercises. The last thing you want to do is exercise but it might be just the thing you need the most. Keep it simple and do exercises that involve sitting or laying on the floor. Even the smallest of exercises release chemicals in the brain that make you feel good. Yoga stretches or core exercises are going to make you feel good with minimal effort.  Make your bed. Making your bed takes a few minutes but it's productive and you'll feel relieved when it's done. An unmade bed is a huge visual reminder that you're  having an unproductive day. Do it and consider it your housework for the day.  Put on some nice clothes. Take the sweatpants off even if you don't plan to go anywhere. Put on clothes that make you feel good. Take a look in the mirror so your brain recognizes the sweatpants have been replaced with clothes that make you look great. It's an instant confidence booster.  Wash the dishes. A pile of dirty dishes in the sink is a reflection of your mood. It's possible that if you wash up the dishes, your mood will follow suit. It's worth a try.  Cook a real meal. If you have the luxury to have a "do nothing" day, you have time to make a real meal for yourself. Make a meal that you love to eat. The process is good to get you out of the funk and the food will ensure you have more energy for tomorrow.  Write out your thoughts by hand. When you hand write, you stimulate your brain to focus on the moment that you're in so make yourself comfortable and write whatever comes into your mind. Put those thoughts out on paper so they stop spinning around in your head. Those thoughts might be the very thing holding you down.   Patient Goals: Follow up goal

## 2021-12-21 NOTE — Patient Outreach (Signed)
Medicaid Managed Care Social Work Note  12/21/2021 Name:  Ann Moran MRN:  [id number] DOB:  Jan 31, 1986  Ann Moran is an 36 y.o. year old female who is a primary patient of Ann Rakes, Moran.  The Medicaid Managed Care Coordination team was consulted for assistance with:  Ann Moran and Resources  Ann Moran was given information about Medicaid Managed Care Coordination team services today. Ann Moran Patient agreed to services and verbal consent obtained.  Engaged with patient  for by telephone forfollow up visit in response to referral for case management and/or care coordination services.   Assessments/Interventions:  Review of past medical history, allergies, medications, health status, including review of consultants reports, laboratory and other test data, was performed as part of comprehensive evaluation and provision of chronic care management services.  SDOH: (Social Determinant of Health) assessments and interventions performed: SDOH Interventions    Flowsheet Row Most Recent Value  SDOH Interventions   Housing Interventions Intervention Not Indicated  Stress Interventions Provide Counseling, Offered [employer] Resources       Advanced Directives Status:  Not addressed in this encounter.  Care Plan                 Allergies  Allergen Reactions   Prednisone Swelling    Had neck and shoulder swelling following taking    Medications Reviewed Today     Reviewed by Ann Cutter, Ann Moran (Social Worker) on 12/20/21 at 1724  Med List Status: <None>   Medication Order Taking? Sig Documenting Provider Last Dose Status Informant  albuterol (VENTOLIN HFA) 108 (90 Base) MCG/ACT inhaler [id number] No Inhale 2 puffs into the lungs every 6 (six) hours as needed for wheezing or shortness of breath (Cough). Ann Oxford Scales, PA-C Taking Active   amLODipine (NORVASC) 10 MG tablet [id number] No TAKE 1 TABLET(10 MG) BY MOUTH DAILY Ann Rakes, Moran Taking Active   benzonatate (TESSALON) 100 MG capsule [id number]  Take 1 capsule (100 mg total) by mouth 3 (three) times daily as needed. Ann Daring, PA-C  Active   Blood Pressure Monitoring (BLOOD PRESSURE CUFF) MISC [id number] No Use to check blood pressure once daily. Ann Rakes, Moran Taking Active   busPIRone (BUSPAR) 15 MG tablet [id number] No Take 1 tablet (15 mg total) by mouth 2 (two) times daily. Ann Moran M, PA-C Taking Active   cetirizine (ZYRTEC ALLERGY) 10 MG tablet [id number] No Take 1 tablet (10 mg total) by mouth at bedtime. Ann Oxford Scales, PA-C Taking Active   diclofenac (VOLTAREN) 75 MG EC tablet [id number] No TAKE 1 TABLET(75 MG) BY MOUTH TWICE DAILY Ann Moran Taking Active   fluconazole (DIFLUCAN) 150 MG tablet [id number]  Take 1 tablet (150 mg total) by mouth daily. -For your yeast infection, start the Diflucan (fluconazole)- Take one pill today (day 1). If you're still having symptoms in 3 days, take the second pill. Ann Sams, PA-C  Active   fluticasone [facility]) 50 MCG/ACT nasal spray [id number] No Place 2 sprays into both nostrils daily. Ann Oxford Scales, PA-C Taking Active   hydrOXYzine (ATARAX) 25 MG tablet [id number]  TAKE 1 TABLET(25 MG) BY MOUTH EVERY 8 HOURS AS NEEDED FOR ANXIETY Ann Rakes, Moran  Active   ipratropium (ATROVENT) 0.06 % nasal spray [id number] No Place 2 sprays into both nostrils 4 (four) times daily. As needed for nasal congestion, runny nose Ann Oxford Scales, PA-C Taking Active   losartan (COZAAR) 100 MG tablet [id number] No  TAKE 1 TABLET(100 MG) BY MOUTH DAILY Ann Rakes, Moran Taking Active   Patient not taking:  Discontinued 12/06/18 0950 metroNIDAZOLE (FLAGYL) 500 MG tablet [id number]  Take 1 tablet (500 mg total) by mouth 2 (two) times daily. Ann Sams, PA-C  Active   omeprazole (PRILOSEC) 40 MG capsule [id number] No Take 1 capsule (40 mg total) by mouth daily. Ann Pier, Moran  Taking Active   ondansetron (ZOFRAN-ODT) 4 MG disintegrating tablet [id number]  Take 1 tablet (4 mg total) by mouth every 8 (eight) hours as needed for nausea or vomiting. Ann Moran M, PA-C  Active   promethazine-dextromethorphan (PROMETHAZINE-DM) 6.25-15 MG/5ML syrup [id number]  Take 5-10 mLs by mouth 4 (four) times daily as needed. Ann Moran M, PA-C  Active   sertraline (ZOLOFT) 50 MG tablet [id number]  TAKE 1 TABLET(50 MG) BY MOUTH DAILY Ann Rakes, Moran  Active   traZODone (DESYREL) 50 MG tablet [id number] No Take 0.5-1 tablets (25-50 mg total) by mouth at bedtime as needed for sleep. Ann Moran, [place] Taking Active             Patient Active Problem List   Diagnosis Date Noted   Smoking 02/23/2021   Patellar tendinitis of both knees 01/27/2021   Pain in joint involving right ankle and foot 01/27/2021   Thoracic back pain 12/23/2020   Right elbow pain 12/23/2020   Chronic pain of right knee 12/23/2020   Ankle fracture, left 08/09/2020   Dysphagia 05/20/2019   Encounter for other preprocedural examination 05/20/2019   PCOS (polycystic ovarian syndrome) 02/24/2019   Neck pain 02/24/2019   Morbid obesity with body mass index (BMI) of 45.0 to 49.9 in adult [facility]) 02/24/2019   Essential hypertension 02/24/2019   Gastroesophageal reflux disease 02/24/2019   Irregular menstrual cycle 01/14/2019   Abnormal uterine bleeding 01/14/2019   Chronic cough 01/14/2019    Conditions to be addressed/monitored per PCP order:  Anxiety and Depression  Care Plan : Ann Moran Plan of Care  Updates made by Ann Cutter, Ann Moran since 12/21/2021 12:00 AM     Problem: Coping Skills (General Plan of Care)      Long-Range Goal: I need mental health support   Start Date: 09/05/2021  Priority: High  Note:   Priority: High  Timeframe:  Long-Range Goal Priority:  High Start Date:   09/05/21            Expected End Date:  ongoing                Follow Up Date--01/01/22 at 1  pm  - check out counseling - keep 90 percent of counseling appointments - schedule counseling appointment    Why is this important?             Beating depression may take some time.            If you don't feel better right away, don't give up on your treatment plan.    Current barriers:   Chronic Mental Health needs related to depression, insomnia, stress and anxiety. Patient requires Support, Education, Resources, Referrals, Advocacy, and Care Coordination, in order to meet Unmet Mental Health Needs. Patient will implement clinical interventions discussed today to decrease symptoms of depression and increase knowledge and/or ability of: coping skills. Mental Health Concerns and Social Isolation Patient lacks knowledge of available community counseling agencies and resources.  Clinical Goal(s): verbalize understanding of plan for management of Anxiety, Depression, Insomnia and Stress and demonstrate a reduction in  symptoms. Patient will connect with a provider for ongoing mental health treatment, increase coping skills, healthy habits, self-management skills, and stress reduction        Clinical Interventions:  Assessed patient's previous and current treatment, coping skills, support system and barriers to care. Patient and spouse provided hx  Verbalization of feelings encouraged, motivational interviewing employed Emotional support provided, positive coping strategies explored Self care/establishing healthy boundaries emphasized Patient reports that she has anxiety, depression and insomnia. She reports that she is not sleeping which makes her feel worse. Patient reports significant worsening anxiety, depression and insomnia impacting their ability to function appropriately and carry out daily task. Patient has NO support network. She and her 110 yo daughter live together.  Patient is agreeable to referral to [facility] for counseling and psychiatry. [facility] Ann Moran made referral on 09/05/21. However,  patient is in need of immediate services and assistance and was encouraged to consider their walk in clinic. She is agreeable to go tomorrow but will have to take her daughter into school later because of it. [facility] Ann Moran completed joint phone call to [place] with patient. [facility] [place] only has an inpatient unit available within [facility]. [facility] Ann Moran and patient left a message with the [facility] to inquire if they would accept patient's insurance for medication management.  Ann Moran provided education on relaxation techniques such as meditation, deep breathing, massage, grounding exercises or yoga that can activate the body's relaxation response and ease symptoms of stress and anxiety. Ann Moran ask that when pt is struggling with difficult emotions and racing thoughts that they start this relaxation response process. Ann Moran provided extensive education on healthy coping skills for anxiety. SW used active and reflective listening, validated patient's feelings/concerns, and provided emotional support. Patient will work on implementing appropriate self-care habits into their daily routine such as: staying positive, writing a gratitude list, drinking water, staying active around the house, combating negative thoughts or emotions and staying connected.  Patient denies any SI/HI. [facility] Ann Moran sent patient an email with mental health service resources within her area that accept her insurance on 09/05/21.  Ann Moran provided education on healthy sleep hygiene and what that looks like. Ann Moran encouraged patient to implement a night time routine into their schedule that works best for them and that they are able to maintain. Advised patient to implement deep breathing/grounding/meditation/self-care exercises into their nightly routine to combat racing thoughts at night. Ann Moran encouraged patient to wake up at the same time each day, make their sleeping environment comfortable, exercise when able,  to limit naps and to not eat or drink anything right before bed. UPDATE 10/03/21- Patient is wanting to enroll into [facility] mental health program. Referral has been made for both psychiatry and counseling but patient has not received a call yet from agency. [facility] Ann Moran offered to complete a joint call to agency to get her appointments scheduled but patient prefers to utilize their walk in clinic as their wait list is several months out at this time. [facility] Ann Moran sent patient an additional email with their walk in hour information. Patient desired to gain information on how to apply for disability. [facility] Ann Moran provided education and advised to her to go to her local social security office. [facility] Ann Moran sent her resources on how to apply for disability through email as well. Patient is agreeable to 30 day follow up next month to ensure that she went to walk in clinic and was established with a long  term mental health provider.  UPDATE 11/02/21- Patient reports that she has been taking care of her sick father and then she got sick and had to go to the ED which caused her to not be able to go to the [facility] walk in clinic. Patient reports that she still wants psychiatry and counseling. [facility] Ann Moran and patient completed joint phone call to [facility] and was able to get her scheduled for both psychiatry and counseling on 11/09/21 at 8:00 am. Patient will receive a discharge follow up call on 11/16/21 to ensure that all of her mental health needs have been met.  UPDATE 11/16/21-Patient had a recent ED visit for kidney stones and wishes to gain nursing assistance. Referral made to [facility]. Patient was a no show for her scheduled mental health appointment at [facility]. Patient reports that she had transportation issues that day. She has agreed to go to [facility] walk in clinic next Wednesday. Email sent to patient today with reminders and plan of care. Patient will contact her PCP as well for a ED follow up appointment. Update 8/31- Patient has been unable  to get psychiatry and counseling sessions rescheduled due to health issues with both her daughter and father. She reports that she will work on this as she knows she has to make her self-care a priority in order to help take care of herself and her family. Emotional support provided.   Motivational Interviewing employed Depression screen reviewed  PHQ2/ PHQ9 completed Mindfulness or Relaxation training provided Active listening / Reflection utilized  Advance Care and HCPOA education provided Emotional Support Provided Problem [place] strategies reviewed Provided psychoeducation for mental health needs  Provided brief CBT  Reviewed mental health medications and discussed importance of compliance:  Quality of sleep assessed & Sleep Hygiene techniques promoted  Participation in counseling encouraged  Verbalization of feelings encouraged  Suicidal Ideation/Homicidal Ideation assessed: Patient denies SI/HI  Review resources, discussed options and provided patient information about  [place] care team collaboration (see longitudinal plan of care) Patient Goals/Self-Care Activities: Over the next 120 days Attend scheduled medical appointments Utilize healthy coping skills and supportive resources discussed Contact PCP with any questions or concerns Keep 90 percent of counseling appointments Call your insurance provider for more information about your Enhanced Benefits  Check out counseling resources provided  Begin personal counseling with Ann Moran, to reduce and manage symptoms of Depression and Stress, until well-established with mental health provider Accept all calls from representative with [facility] in an effort to establish ongoing mental health counseling and supportive services. Incorporate into daily practice - relaxation techniques, deep breathing exercises, and mindfulness meditation strategies. Talk about feelings with friends, family  members, spiritual advisor, etc. Contact Ann Moran directly [phone]), if you have questions, need assistance, or if additional social work needs are identified between now and our next scheduled telephone outreach call. Call 988 for mental health hotline/crisis line if needed (24/7 available) Try techniques to reduce symptoms of anxiety/negative thinking (deep breathing, distraction, positive self talk, etc)  - develop a personal safety plan - develop a plan to deal with triggers like holidays, anniversaries - exercise at least 2 to 3 times per week - have a plan for how to handle bad days - journal feelings and what helps to feel better or worse - spend time or talk with others at least 2 to 3 times per week - watch for early signs of feeling worse - begin personal counseling - call and visit an old  friend - check out volunteer opportunities - join a support group - laugh; watch a funny movie or comedian - learn and use visualization or guided imagery - perform a random act of kindness - practice relaxation or meditation daily - start or continue a personal journal - practice positive thinking and self-talk -continue with compliance of taking medication  -identify current effective and ineffective coping strategies.  -implement positive self-talk in care to increase self-esteem, confidence and feelings of control.  -consider alternative and complementary therapy approaches such as meditation, mindfulness or yoga.  -journaling, prayer, worship services, meditation or pastoral counseling.  -increase participation in pleasurable group activities such as hobbies, singing, sports or volunteering).  -consider the use of meditative movement therapy such as tai chi, yoga or qigong.  -start a regular daily exercise program based on tolerance, ability and patient choice to support positive thinking and activity        24- Hour Availability:    [facility]  [address] [place], [place] [facility] [phone]   [facility] [employer] [place]  [phone]    [place]  [phone] (after hours)   Therapeutic Alternative/Mobile Crisis   [phone]   [place] National Suicide Hotline  (850)630-5065 (TALK) OR 988   Call 911 or go to emergency room   [facility]  [phone]);  [place] and [employer]  [phone]); [place], [place], [place], [place], [place], [place], [place]        10 LITTLE Things To Do When You're Feeling Too Down To Do Anything  Take a shower. Even if you plan to stay in all day long and not see a soul, take a shower. It takes the most effort to hop in to the shower but once you do, you'll feel immediate results. It will wake you up and you'll be feeling much fresher (and cleaner too).  Brush and floss your teeth. Give your teeth a good brushing with a floss finish. It's a small task but it feels so good and you can check 'taking care of your health' off the list of things to do.  Do something small on your list. Most of [place] have some small thing we would like to get done (load of laundry, sew a button, email a friend). Doing one of these things will make you feel like you've accomplished something.  Drink water. Drinking water is easy right? It's also really beneficial for your health so keep a glass beside you all day and take sips often. It gives you energy and prevents you from boredom eating.  Do some floor exercises. The last thing you want to do is exercise but it might be just the thing you need the most. Keep it simple and do exercises that involve sitting or laying on the floor. Even the smallest of exercises release chemicals in the brain that make you feel good. Yoga stretches or core exercises are going to make you feel good with minimal effort.  Make your bed. Making your bed  takes a few minutes but it's productive and you'll feel relieved when it's done. An unmade bed is a huge visual reminder that you're having an unproductive day. Do it and consider it your housework for the day.  Put on some nice clothes. Take the sweatpants off even if you don't plan to go anywhere. Put on clothes that make you feel good.  Take a look in the mirror so your brain recognizes the sweatpants have been replaced with clothes that make you look great. It's an instant confidence booster.  Wash the dishes. A pile of dirty dishes in the sink is a reflection of your mood. It's possible that if you wash up the dishes, your mood will follow suit. It's worth a try.  Cook a real meal. If you have the luxury to have a "do nothing" day, you have time to make a real meal for yourself. Make a meal that you love to eat. The process is good to get you out of the funk and the food will ensure you have more energy for tomorrow.  Write out your thoughts by hand. When you hand write, you stimulate your brain to focus on the moment that you're in so make yourself comfortable and write whatever comes into your mind. Put those thoughts out on paper so they stop spinning around in your head. Those thoughts might be the very thing holding you down.   Patient Goals: Follow up goal     Follow up:  Patient agrees to Care Plan and Follow-up.  Plan: The Managed Medicaid care management team will reach out to the patient again over the next 30 days.  Date/time of next scheduled Social Work care management/care coordination outreach:  9/11 at 230 pm.  Eula Fried, [place], MSW, [place] Medicaid Ann Moran [email or web address] Phone: [phone]

## 2021-12-26 ENCOUNTER — Ambulatory Visit: Payer: Self-pay

## 2021-12-28 ENCOUNTER — Other Ambulatory Visit: Payer: Self-pay | Admitting: Family Medicine

## 2021-12-28 DIAGNOSIS — I10 Essential (primary) hypertension: Secondary | ICD-10-CM

## 2021-12-29 ENCOUNTER — Ambulatory Visit: Payer: Self-pay

## 2021-12-29 ENCOUNTER — Ambulatory Visit: Payer: Medicaid Other

## 2022-01-01 ENCOUNTER — Other Ambulatory Visit: Payer: Self-pay

## 2022-01-01 ENCOUNTER — Ambulatory Visit: Payer: Medicaid Other | Admitting: Family Medicine

## 2022-01-01 NOTE — Patient Instructions (Signed)
Ann Moran ,   The [facility] Managed Care Team is available to provide assistance to you with your healthcare needs at no cost and as a benefit of your [facility] Health plan. I'm sorry I was unable to reach you today for our scheduled appointment. Our care guide will call you to reschedule our telephone appointment. Please call me at the number below. I am available to be of assistance to you regarding your healthcare needs. .   Thank you,   Dickie La, BSW, MSW, LCSW Managed Medicaid LCSW [facility]  [address] [email or web address] Phone: [phone]

## 2022-01-01 NOTE — Patient Outreach (Signed)
  Medicaid Managed Care   Unsuccessful Attempt Note   01/01/2022 Name: Ann Moran MRN: [id number] DOB: 12/25/1985  Referred by: Hoy Register, MD Reason for referral : High Risk Managed Medicaid   An unsuccessful telephone outreach was attempted today. The patient was referred to the case management team for assistance with care management and care coordination.    Follow Up Plan: A HIPAA compliant phone message was left for the patient providing contact information and requesting a return call.   Dickie La, BSW, MSW, [employer] Managed Medicaid LCSW [facility]  [facility] [email or web address] Phone: [phone]

## 2022-01-02 ENCOUNTER — Ambulatory Visit: Payer: Medicaid Other | Admitting: Critical Care Medicine

## 2022-01-02 ENCOUNTER — Other Ambulatory Visit: Payer: Self-pay | Admitting: Obstetrics and Gynecology

## 2022-01-02 ENCOUNTER — Telehealth: Payer: Self-pay | Admitting: Family Medicine

## 2022-01-02 NOTE — Patient Outreach (Signed)
Care Coordination  01/02/2022  ANTOINETTE BORGWARDT 1985-08-02 [id number]   Medicaid Managed Care   Unsuccessful Outreach Note  01/02/2022 Name: Ann Moran MRN: [id number] DOB: [birth year]  Referred by: Hoy Register, MD Reason for referral : High Risk Managed Medicaid (Unsuccessful telephone outreach)   Fourth unsuccessful telephone outreach was attempted today. The patient was referred to the case management team for assistance with care management and care coordination. The patient's primary care provider has been notified of our unsuccessful attempts to make or maintain contact with the patient. The care management team is pleased to engage with this patient at any time in the future should he/she be interested in assistance from the care management team.   Follow Up Plan: We have been unable to make contact with the patient for follow up. The care management team is available to follow up with the patient after provider conversation with the patient regarding recommendation for care management engagement and subsequent re-referral to the care management team.   Kathi Der RN, BSN [place]  [facility] Care Management Coordinator - Managed [facility] High Risk [phone].

## 2022-01-02 NOTE — Patient Instructions (Signed)
Hi Ms. Aguallo, sorry I missed you today - as a part of your Medicaid benefit, you are eligible for care management and care coordination services at no cost or copay. I was unable to reach you by phone today but would be happy to help you with your health related needs. Please feel free to call me at [phone].Marland Kitchen  Marland KitchenKathi Der RN, BSN [place]  [facility], production - Managed Medicaid High Risk [phone].

## 2022-01-02 NOTE — Telephone Encounter (Signed)
..   Medicaid Managed Care   Unsuccessful Outreach Note  01/02/2022 Name: Ann Moran MRN: [id number] DOB: 11/05/1985  Referred by: Hoy Register, MD Reason for referral : High Risk Managed Medicaid (I called the patient today to get her rescheduled with MM LCSW. I left my name and number on her VM.)   A second unsuccessful telephone outreach was attempted today. The patient was referred to the case management team for assistance with care management and care coordination.   Follow Up Plan: The care management team will reach out to the patient again over the next 7 days.    Weston Settle Care Guide, High Risk Medicaid Managed Care Embedded Care Coordination [facility]  [facility]

## 2022-01-03 ENCOUNTER — Encounter: Payer: Self-pay | Admitting: Family Medicine

## 2022-01-03 ENCOUNTER — Ambulatory Visit: Payer: Medicaid Other | Attending: Family Medicine | Admitting: Family Medicine

## 2022-01-03 VITALS — BP 135/94 | HR 78 | Temp 99.0°F | Ht 69.0 in | Wt 307.2 lb

## 2022-01-03 DIAGNOSIS — M79631 Pain in right forearm: Secondary | ICD-10-CM | POA: Diagnosis not present

## 2022-01-03 DIAGNOSIS — I10 Essential (primary) hypertension: Secondary | ICD-10-CM | POA: Diagnosis not present

## 2022-01-03 DIAGNOSIS — G43809 Other migraine, not intractable, without status migrainosus: Secondary | ICD-10-CM

## 2022-01-03 DIAGNOSIS — G4700 Insomnia, unspecified: Secondary | ICD-10-CM | POA: Diagnosis not present

## 2022-01-03 MED ORDER — AMITRIPTYLINE HCL 10 MG PO TABS: 10.0000 mg | ORAL_TABLET | Freq: Every day | ORAL | 1 refills | Status: DC

## 2022-01-03 MED ORDER — MELOXICAM 7.5 MG PO TABS: 7.5000 mg | ORAL_TABLET | Freq: Every day | ORAL | 1 refills | Status: DC

## 2022-01-03 NOTE — Progress Notes (Signed)
Discuss new sleep medication. Diclofenac is not working.

## 2022-01-03 NOTE — Progress Notes (Signed)
Subjective:  Patient ID: Ann Moran, female    DOB: 1986-01-12  Age: 36 y.o. MRN: [id number]  CC: Depression   HPI NEYA CREEGAN is a 36 y.o. year old female with a history of hypertension, PCOS seen today for follow-up visit.   Interval History:  She is still having insomnia despite taking trazodone and finds that she also has to take Tylenol PM in addition.  She is also on hydroxyzine and BuSpar for anxiety. Doing well on Zoloft for her depression.  Complains of migraines in her occiput x1 year with associated photophobia occurring about 3x/week.  She has no nausea or vomiting. Her BP is elevated today but was normal at her last visit.  She attributes elevation to being stressed today.  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 and Diclofenac  Past Medical History:  Diagnosis Date   Allergy    Anemia    Anxiety    Chlamydia    Depression    hx of meds, none currently- doing ok   Diabetes mellitus without complication (HCC)    GERD (gastroesophageal reflux disease)    Infection due to trichomonas    MRSA (methicillin resistant staph aureus) culture positive    Dec 2012   Obesity    Pregnancy induced hypertension    Urinary tract infection     Past Surgical History:  Procedure Laterality Date   CHOLECYSTECTOMY     ECTOPIC PREGNANCY SURGERY     ESOPHAGOGASTRODUODENOSCOPY  05/2019    Family History  Problem Relation Age of Onset   Hypertension Mother    Diabetes Mother    Asthma Father    Hypertension Father    Stroke Maternal Grandmother    Diabetes Paternal Grandmother    Hypertension Paternal Grandmother    Colon cancer Paternal Grandmother    Colon cancer Paternal Aunt    Esophageal cancer Neg Hx    Stomach cancer Neg Hx    Rectal cancer Neg Hx     Social History   Socioeconomic History   Marital status: Single    Spouse name: Not on file   Number of children: Not on file   Years of education: Not on file   Highest education  level: Not on file  Occupational History   Not on file  Tobacco Use   Smoking status: Every Day    Packs/day: 0.50    Years: 9.00    Total pack years: 4.50    Types: Cigarettes   Smokeless tobacco: Never  Vaping Use   Vaping Use: Some days   Last attempt to quit: 08/18/2015   Devices: "rarely"  Substance and Sexual Activity   Alcohol use: Yes   Drug use: Yes    Types: Marijuana    Comment: Smokes daily to treat migraines   Sexual activity: Not Currently  Other Topics Concern   Not on file  Social History Narrative   Not on file   Social Determinants of Health   Financial Resource Strain: Not on file  Food Insecurity: No Food Insecurity (03/14/2020)   Hunger Vital Sign    Worried About Running Out of Food in the Last Year: Never true    Ran Out of Food in the Last Year: Never true  Transportation Needs: No Transportation Needs (03/14/2020)   PRAPARE - Administrator, Civil Service (Medical): No    Lack of Transportation (Non-Medical): No  Physical Activity: Not on file  Stress: Stress Concern  Present (12/20/2021)   [employer] of Occupational Health - Occupational Stress Questionnaire    Feeling of Stress : Very much  Social Connections: Not on file    Allergies  Allergen Reactions   Prednisone Swelling    Had neck and shoulder swelling following taking    Outpatient Medications Prior to Visit  Medication Sig Dispense Refill   amLODipine (NORVASC) 10 MG tablet TAKE 1 TABLET(10 MG) BY MOUTH DAILY 90 tablet 0   Blood Pressure Monitoring (BLOOD PRESSURE CUFF) MISC Use to check blood pressure once daily. 1 each 0   busPIRone (BUSPAR) 15 MG tablet Take 1 tablet (15 mg total) by mouth 2 (two) times daily. 60 tablet 2   hydrOXYzine (ATARAX) 25 MG tablet TAKE 1 TABLET(25 MG) BY MOUTH EVERY 8 HOURS AS NEEDED FOR ANXIETY 60 tablet 0   losartan (COZAAR) 100 MG tablet TAKE 1 TABLET(100 MG) BY MOUTH DAILY 90 tablet 0   omeprazole (PRILOSEC) 40 MG capsule Take  1 capsule (40 mg total) by mouth daily. 30 capsule 0   ondansetron (ZOFRAN-ODT) 4 MG disintegrating tablet Take 1 tablet (4 mg total) by mouth every 8 (eight) hours as needed for nausea or vomiting. 20 tablet 0   sertraline (ZOLOFT) 50 MG tablet TAKE 1 TABLET(50 MG) BY MOUTH DAILY 30 tablet 1   benzonatate (TESSALON) 100 MG capsule Take 1 capsule (100 mg total) by mouth 3 (three) times daily as needed. (Patient not taking: Reported on 01/03/2022) 30 capsule 0   cetirizine (ZYRTEC ALLERGY) 10 MG tablet Take 1 tablet (10 mg total) by mouth at bedtime. 90 tablet 1   traZODone (DESYREL) 50 MG tablet Take 0.5-1 tablets (25-50 mg total) by mouth at bedtime as needed for sleep. (Patient not taking: Reported on 01/03/2022) 30 tablet 3   albuterol (VENTOLIN HFA) 108 (90 Base) MCG/ACT inhaler Inhale 2 puffs into the lungs every 6 (six) hours as needed for wheezing or shortness of breath (Cough). (Patient not taking: Reported on 01/03/2022) 6 each 1   diclofenac (VOLTAREN) 75 MG EC tablet TAKE 1 TABLET(75 MG) BY MOUTH TWICE DAILY (Patient not taking: Reported on 01/03/2022) 60 tablet 1   fluconazole (DIFLUCAN) 150 MG tablet Take 1 tablet (150 mg total) by mouth daily. -For your yeast infection, start the Diflucan (fluconazole)- Take one pill today (day 1). If you're still having symptoms in 3 days, take the second pill. (Patient not taking: Reported on 01/03/2022) 2 tablet 0   fluticasone (FLONASE) 50 MCG/ACT nasal spray Place 2 sprays into both nostrils daily. (Patient not taking: Reported on 01/03/2022) 48 mL 1   ipratropium (ATROVENT) 0.06 % nasal spray Place 2 sprays into both nostrils 4 (four) times daily. As needed for nasal congestion, runny nose (Patient not taking: Reported on 01/03/2022) 15 mL 2   metroNIDAZOLE (FLAGYL) 500 MG tablet Take 1 tablet (500 mg total) by mouth 2 (two) times daily. (Patient not taking: Reported on 01/03/2022) 14 tablet 0   promethazine-dextromethorphan (PROMETHAZINE-DM) 6.25-15 MG/5ML  syrup Take 5-10 mLs by mouth 4 (four) times daily as needed. (Patient not taking: Reported on 01/03/2022) 118 mL 0   No facility-administered medications prior to visit.     ROS Review of Systems  Constitutional:  Negative for activity change and appetite change.  HENT:  Negative for sinus pressure and sore throat.   Respiratory:  Negative for chest tightness, shortness of breath and wheezing.   Cardiovascular:  Negative for chest pain and palpitations.  Gastrointestinal:  Negative for abdominal  distention, abdominal pain and constipation.  Genitourinary: Negative.   Musculoskeletal:        See HPI  Neurological:  Positive for headaches.  Psychiatric/Behavioral:  Negative for behavioral problems and dysphoric mood.     Objective:  BP (!) 135/94   Pulse 78   Temp 99 F (37.2 C) (Oral)   Ht 5\' 9"  (1.753 m)   Wt (!) 307 lb 3.2 oz (139.3 kg)   SpO2 98%   BMI 45.37 kg/m      01/03/2022    3:32 PM 12/12/2021    4:00 PM 11/16/2021   12:00 AM  BP/Weight  Systolic BP 135 129 136  Diastolic BP 94 85 90  Wt. (Lbs) 307.2    BMI 45.37 kg/m2        Physical Exam Constitutional:      Appearance: She is well-developed. She is obese.  Cardiovascular:     Rate and Rhythm: Normal rate.     Heart sounds: Normal heart sounds. No murmur heard. Pulmonary:     Effort: Pulmonary effort is normal.     Breath sounds: Normal breath sounds. No wheezing or rales.  Chest:     Chest wall: No tenderness.  Abdominal:     General: Bowel sounds are normal. There is no distension.     Palpations: Abdomen is soft. There is no mass.     Tenderness: There is no abdominal tenderness.  Musculoskeletal:        General: Normal range of motion.     Right lower leg: No edema.     Left lower leg: No edema.     Comments: Normal appearance of bilateral forearm no tenderness on flexion and extension of both wrists and both elbows Normal handgrip  Neurological:     Mental Status: She is alert and  oriented to person, place, and time.  Psychiatric:        Mood and Affect: Mood normal.        Latest Ref Rng & Units 11/15/2021   10:05 PM 02/23/2021   10:09 AM 11/11/2019   11:24 AM  CMP  Glucose 70 - 99 mg/dL 11/13/2019  96  91   BUN 6 - 20 mg/dL 7  6  <5   Creatinine 867 - 1.00 mg/dL 6.19  5.09  3.26   Sodium 135 - 145 mmol/L 140  142  141   Potassium 3.5 - 5.1 mmol/L 3.6  4.2  4.1   Chloride 98 - 111 mmol/L 108  102  103   CO2 22 - 32 mmol/L 26  26  29    Calcium 8.9 - 10.3 mg/dL 8.7  8.9  9.0   Total Protein 6.5 - 8.1 g/dL 6.8  6.8  7.2   Total Bilirubin 0.3 - 1.2 mg/dL 0.3  0.3  0.6   Alkaline Phos 38 - 126 U/L 48  68  57   AST 15 - 41 U/L 14  14  14    ALT 0 - 44 U/L 16  14  19      Lipid Panel  No results found for: "CHOL", "TRIG", "HDL", "CHOLHDL", "VLDL", "LDLCALC", "LDLDIRECT"  CBC    Component Value Date/Time   WBC 8.3 11/15/2021 2205   RBC 4.13 11/15/2021 2205   HGB 12.5 11/15/2021 2205   HGB 13.6 04/05/2021 1351   HCT 38.5 11/15/2021 2205   HCT 39.0 04/05/2021 1351   PLT 293 11/15/2021 2205   PLT 354 04/05/2021 1351   MCV 93.2 11/15/2021 2205  MCV 86 04/05/2021 1351   MCH 30.3 11/15/2021 2205   MCHC 32.5 11/15/2021 2205   RDW 13.2 11/15/2021 2205   RDW 12.9 04/05/2021 1351   LYMPHSABS 2.9 11/15/2021 2205   LYMPHSABS 3.7 (H) 04/05/2021 1351   MONOABS 0.6 11/15/2021 2205   EOSABS 0.5 11/15/2021 2205   EOSABS 0.2 04/05/2021 1351   BASOSABS 0.0 11/15/2021 2205   BASOSABS 0.0 04/05/2021 1351    Lab Results  Component Value Date   HGBA1C 5.7 (H) 02/23/2021    Assessment & Plan:  1. Essential hypertension Slightly above goal Blood pressure was normal at last visit hence I will make no regimen changes Continue current regimen  2. Insomnia, unspecified type Uncontrolled Currently on trazodone I have added Elavil for migraine prophylaxis and hopefully this will help with insomnia If insomnia persist consider increasing trazodone dose  3. Other  migraine without status migrainosus, not intractable Counseled on options for prophylaxis and we have discussed possible side effects She has opted to go with Elavil - amitriptyline (ELAVIL) 10 MG tablet; Take 1 tablet (10 mg total) by mouth at bedtime. For migraines  Dispense: 30 tablet; Refill: 1  4. Pain of right forearm Uncontrolled on diclofenac Last x-ray of the right elbow was unremarkable in 10/2020 - meloxicam (MOBIC) 7.5 MG tablet; Take 1 tablet (7.5 mg total) by mouth daily.  Dispense: 30 tablet; Refill: 1    Meds ordered this encounter  Medications   amitriptyline (ELAVIL) 10 MG tablet    Sig: Take 1 tablet (10 mg total) by mouth at bedtime. For migraines    Dispense:  30 tablet    Refill:  1   meloxicam (MOBIC) 7.5 MG tablet    Sig: Take 1 tablet (7.5 mg total) by mouth daily.    Dispense:  30 tablet    Refill:  1    Follow-up: Return in about 3 months (around 04/04/2022) for Follow-up on migraine.       Hoy Register, MD, FAAFP. [facility] [place], [place] [phone]   01/03/2022, 4:45 PM

## 2022-01-03 NOTE — Patient Instructions (Signed)
Migraine Headache A migraine headache is an intense, throbbing pain on one side or both sides of the head. Migraine headaches may also cause other symptoms, such as nausea, vomiting, and sensitivity to light and noise. A migraine headache can last from 4 hours to 3 days. Talk with your doctor about what things may bring on (trigger) your migraine headaches. What are the causes? The exact cause of this condition is not known. However, a migraine may be caused when nerves in the brain become irritated and release chemicals that cause inflammation of blood vessels. This inflammation causes pain. This condition may be triggered or caused by: Drinking alcohol. Smoking. Taking medicines, such as: Medicine used to treat chest pain (nitroglycerin). Birth control pills. Estrogen. Certain blood pressure medicines. Eating or drinking products that contain nitrates, glutamate, aspartame, or tyramine. Aged cheeses, chocolate, or caffeine may also be triggers. Doing physical activity. Other things that may trigger a migraine headache include: Menstruation. Pregnancy. Hunger. Stress. Lack of sleep or too much sleep. Weather changes. Fatigue. What increases the risk? The following factors may make you more likely to experience migraine headaches: Being a certain age. This condition is more common in people who are 25-55 years old. Being female. Having a family history of migraine headaches. Being Caucasian. Having a mental health condition, such as depression or anxiety. Being obese. What are the signs or symptoms? The main symptom of this condition is pulsating or throbbing pain. This pain may: Happen in any area of the head, such as on one side or both sides. Interfere with daily activities. Get worse with physical activity. Get worse with exposure to bright lights or loud noises. Other symptoms may include: Nausea. Vomiting. Dizziness. General sensitivity to bright lights, loud noises, or  smells. Before you get a migraine headache, you may get warning signs (an aura). An aura may include: Seeing flashing lights or having blind spots. Seeing bright spots, halos, or zigzag lines. Having tunnel vision or blurred vision. Having numbness or a tingling feeling. Having trouble talking. Having muscle weakness. Some people have symptoms after a migraine headache (postdromal phase), such as: Feeling tired. Difficulty concentrating. How is this diagnosed? A migraine headache can be diagnosed based on: Your symptoms. A physical exam. Tests, such as: CT scan or an MRI of the head. These imaging tests can help rule out other causes of headaches. Taking fluid from the spine (lumbar puncture) and analyzing it (cerebrospinal fluid analysis, or CSF analysis). How is this treated? This condition may be treated with medicines that: Relieve pain. Relieve nausea. Prevent migraine headaches. Treatment for this condition may also include: Acupuncture. Lifestyle changes like avoiding foods that trigger migraine headaches. Biofeedback. Cognitive behavioral therapy. Follow these instructions at home: Medicines Take over-the-counter and prescription medicines only as told by your health care provider. Ask your health care provider if the medicine prescribed to you: Requires you to avoid driving or using heavy machinery. Can cause constipation. You may need to take these actions to prevent or treat constipation: Drink enough fluid to keep your urine pale yellow. Take over-the-counter or prescription medicines. Eat foods that are high in fiber, such as beans, whole grains, and fresh fruits and vegetables. Limit foods that are high in fat and processed sugars, such as fried or sweet foods. Lifestyle Do not drink alcohol. Do not use any products that contain nicotine or tobacco, such as cigarettes, e-cigarettes, and chewing tobacco. If you need help quitting, ask your health care  provider. Get at least 8   hours of sleep every night. Find ways to manage stress, such as meditation, deep breathing, or yoga. General instructions     Keep a journal to find out what may trigger your migraine headaches. For example, write down: What you eat and drink. How much sleep you get. Any change to your diet or medicines. If you have a migraine headache: Avoid things that make your symptoms worse, such as bright lights. It may help to lie down in a dark, quiet room. Do not drive or use heavy machinery. Ask your health care provider what activities are safe for you while you are experiencing symptoms. Keep all follow-up visits as told by your health care provider. This is important. Contact a health care provider if: You develop symptoms that are different or more severe than your usual migraine headache symptoms. You have more than 15 headache days in one month. Get help right away if: Your migraine headache becomes severe. Your migraine headache lasts longer than 72 hours. You have a fever. You have a stiff neck. You have vision loss. Your muscles feel weak or like you cannot control them. You start to lose your balance often. You have trouble walking. You faint. You have a seizure. Summary A migraine headache is an intense, throbbing pain on one side or both sides of the head. Migraines may also cause other symptoms, such as nausea, vomiting, and sensitivity to light and noise. This condition may be treated with medicines and lifestyle changes. You may also need to avoid certain things that trigger a migraine headache. Keep a journal to find out what may trigger your migraine headaches. Contact your health care provider if you have more than 15 headache days in a month or you develop symptoms that are different or more severe than your usual migraine headache symptoms. This information is not intended to replace advice given to you by your health care provider. Make sure  you discuss any questions you have with your health care provider. Document Revised: 08/01/2018 Document Reviewed: 05/22/2018 Elsevier Patient Education  2023 Elsevier Inc.  

## 2022-01-04 ENCOUNTER — Other Ambulatory Visit: Payer: Self-pay | Admitting: Family Medicine

## 2022-01-04 DIAGNOSIS — G4700 Insomnia, unspecified: Secondary | ICD-10-CM

## 2022-01-04 DIAGNOSIS — F32A Depression, unspecified: Secondary | ICD-10-CM

## 2022-01-04 NOTE — Telephone Encounter (Unsigned)
Copied from CRM [id number]. Topic: General - Other >> Jan 04, 2022  8:31 AM Everette C wrote: Reason for CRM: Medication Refill - Medication: traZODone (DESYREL) 50 MG tablet [[id number]]   Has the patient contacted their pharmacy? Yes.   (Agent: If no, request that the patient contact the pharmacy for the refill. If patient does not wish to contact the pharmacy document the reason why and proceed with request.) (Agent: If yes, when and what did the pharmacy advise?)  Preferred Pharmacy (with phone number or street name): [facility] [phone] - [place], [place] - [address] & [address] [place] [address] Phone: [phone] Fax: [phone] Hours: Not open 24 hours   Has the patient been seen for an appointment in the last year OR does the patient have an upcoming appointment? Yes.    Agent: Please be advised that RX refills may take up to 3 business days. We ask that you follow-up with your pharmacy.

## 2022-01-05 MED ORDER — TRAZODONE HCL 50 MG PO TABS: 25.0000 mg | ORAL_TABLET | Freq: Every evening | ORAL | 3 refills | Status: DC | PRN

## 2022-01-05 NOTE — Telephone Encounter (Signed)
Requested Prescriptions  Pending Prescriptions Disp Refills  . traZODone (DESYREL) 50 MG tablet 30 tablet 3    Sig: Take 0.5-1 tablets (25-50 mg total) by mouth at bedtime as needed for sleep.     Psychiatry: Antidepressants - Serotonin Modulator Passed - 01/04/2022  9:08 AM      Passed - Valid encounter within last 6 months    Recent Outpatient Visits          2 days ago Essential hypertension   [facility] Hoy Register, MD   3 weeks ago Essential hypertension   [facility] [place] Hux[place], Cornelius Moras, RPH-CPP   3 months ago Grief reaction   [facility] [place], Marylene Land M, [place]   9 months ago Exposure to pneumonia   [facility] [address], Shea Stakes, NP   10 months ago Screening examination for STD (sexually transmitted disease)   [facility] [place], Marzella Schlein, [place]      Future Appointments            In 3 months Hoy Register, MD [facility]

## 2022-01-06 ENCOUNTER — Other Ambulatory Visit: Payer: Self-pay | Admitting: Family Medicine

## 2022-01-09 ENCOUNTER — Ambulatory Visit
Admission: RE | Admit: 2022-01-09 | Discharge: 2022-01-09 | Disposition: A | Discharge status: Home / Self Care | Payer: Medicaid Other | Source: Ambulatory Visit | Attending: Emergency Medicine | Admitting: Emergency Medicine

## 2022-01-09 VITALS — BP 135/90 | HR 86 | Temp 98.8°F | Resp 18

## 2022-01-09 DIAGNOSIS — N760 Acute vaginitis: Secondary | ICD-10-CM

## 2022-01-09 DIAGNOSIS — Z202 Contact with and (suspected) exposure to infections with a predominantly sexual mode of transmission: Secondary | ICD-10-CM

## 2022-01-09 DIAGNOSIS — Z113 Encounter for screening for infections with a predominantly sexual mode of transmission: Secondary | ICD-10-CM | POA: Diagnosis present

## 2022-01-09 LAB — POCT URINALYSIS DIP (MANUAL ENTRY)
Bilirubin, UA: NEGATIVE
Glucose, UA: NEGATIVE mg/dL
Ketones, POC UA: NEGATIVE mg/dL
Nitrite, UA: NEGATIVE
Protein Ur, POC: NEGATIVE mg/dL
Spec Grav, UA: 1.01 (ref 1.010–1.025)
Urobilinogen, UA: 0.2 E.U./dL
pH, UA: 6.5 (ref 5.0–8.0)

## 2022-01-09 LAB — POCT URINE PREGNANCY: Preg Test, Ur: NEGATIVE

## 2022-01-09 NOTE — Discharge Instructions (Addendum)
The results of your vaginal swab STD test which screens for BV, yeast, gonorrhea, chlamydia and trichomonas will be made available to you once it is complete.  This typically takes 3 to 5 days.  Please note that we do not test for herpes virus unless you are having an active lesion concerning for herpes outbreak.  If this occurs, please return for evaluation.    The results of your STD screening will initially be posted to your MyChart account and, if any of your results are abnormal, you will receive a phone call regarding treatment.  Prescriptions, if any are needed, will be provided for you at your pharmacy.     Your urinalysis today was not concerning for urinary tract infection.   Your urine pregnancy test today is negative.  Please abstain from sexual intercourse of any kind, vaginal, oral or anal, until you have received the results of your STD testing.    Please remember that your body is a Ann Moran and you are the Ann Moran.  The only way to prevent transmission of sexually transmitted disease when having sexual intercourse is to use condoms.  Repeat sexually transmitted infections can cause scarring in your fallopian tubes which will interfere with your ability to have children.  Repeat exposures to sexually transmitted diseases can also increase your risk of contracting HPV, the human papilloma virus which causes cervical cancer and genital warts and HIV.   If you have not had complete resolution of your symptoms after completing treatment, please return for repeat evaluation.   Thank you for visiting urgent care today.  I appreciate the opportunity to participate in your care.

## 2022-01-09 NOTE — ED Provider Notes (Signed)
[facility]    CSN: [id number] Arrival date & time: 01/09/22  0827    HISTORY   Chief Complaint  Patient presents with   Vaginal Discharge    Entered by patient   appt 9   HPI Ann Moran is a pleasant, 36 y.o. female who presents to urgent care today. Patient complains of 4-day history of a foul-smelling vaginal discharge, states she found out recently that her current partner has been having sex with another partner.  Patient is asking to be tested for sexually transmitted diseases today.  Patient endorses burning with urination, sensation of incomplete emptying, thin, gray vaginal discharge, vaginal discharge with fishy odor, vaginal itching and possible exposure to STD.  Patient denies abnormal odor of urine, increased frequency of urination, increased urge to urinate, suprapubic pain, perineal pain, incontinence of urine, left-sided flank pain, right-sided flank pain, fever, chills, malaise, rigors, significant fatigue, white vaginal discharge, vaginal irritation, dyspareunia, and genital lesion(s).  Patient denies history of frequent urinary tract infections.  Patient reports a prior history of STD.   The history is provided by the patient.   Past Medical History:  Diagnosis Date   Allergy    Anemia    Anxiety    Chlamydia    Depression    hx of meds, none currently- doing ok   Diabetes mellitus without complication (HCC)    GERD (gastroesophageal reflux disease)    Infection due to trichomonas    MRSA (methicillin resistant staph aureus) culture positive    Dec 2012   Obesity    Pregnancy induced hypertension    Urinary tract infection    Patient Active Problem List   Diagnosis Date Noted   Smoking 02/23/2021   Patellar tendinitis of both knees 01/27/2021   Pain in joint involving right ankle and foot 01/27/2021   Thoracic back pain 12/23/2020   Right elbow pain 12/23/2020   Chronic pain of right knee 12/23/2020   Ankle fracture, left 08/09/2020    Dysphagia 05/20/2019   Encounter for other preprocedural examination 05/20/2019   PCOS (polycystic ovarian syndrome) 02/24/2019   Neck pain 02/24/2019   Morbid obesity with body mass index (BMI) of 45.0 to 49.9 in adult [facility]) 02/24/2019   Essential hypertension 02/24/2019   Gastroesophageal reflux disease 02/24/2019   Irregular menstrual cycle 01/14/2019   Abnormal uterine bleeding 01/14/2019   Chronic cough 01/14/2019   Past Surgical History:  Procedure Laterality Date   CHOLECYSTECTOMY     ECTOPIC PREGNANCY SURGERY     ESOPHAGOGASTRODUODENOSCOPY  05/2019   OB History     Gravida  2   Para  1   Term  0   Preterm  1   AB  1   Living  1      SAB      IAB      Ectopic  1   Multiple      Live Births  1          Home Medications    Prior to Admission medications   Medication Sig Start Date End Date Taking? Authorizing Provider  amitriptyline (ELAVIL) 10 MG tablet Take 1 tablet (10 mg total) by mouth at bedtime. For migraines 01/03/22   Hoy Register, MD  amLODipine (NORVASC) 10 MG tablet TAKE 1 TABLET(10 MG) BY MOUTH DAILY 12/28/21   Hoy Register, MD  Blood Pressure Monitoring (BLOOD PRESSURE CUFF) MISC Use to check blood pressure once daily. 08/29/21   Hoy Register, MD  busPIRone (  BUSPAR) 15 MG tablet Take 1 tablet (15 mg total) by mouth 2 (two) times daily. 09/14/21   Anders Simmonds, PA-C  cetirizine (ZYRTEC ALLERGY) 10 MG tablet Take 1 tablet (10 mg total) by mouth at bedtime. 07/04/21 12/31/21  Theadora Rama Scales, PA-C  hydrOXYzine (ATARAX) 25 MG tablet TAKE 1 TABLET(25 MG) BY MOUTH EVERY 8 HOURS AS NEEDED FOR ANXIETY 11/28/21   Hoy Register, MD  losartan (COZAAR) 100 MG tablet TAKE 1 TABLET(100 MG) BY MOUTH DAILY 12/28/21   Hoy Register, MD  meloxicam (MOBIC) 7.5 MG tablet Take 1 tablet (7.5 mg total) by mouth daily. 01/03/22   Hoy Register, MD  omeprazole (PRILOSEC) 40 MG capsule Take 1 capsule (40 mg total) by mouth daily. 09/15/21   Marcine Matar, MD  ondansetron (ZOFRAN-ODT) 4 MG disintegrating tablet Take 1 tablet (4 mg total) by mouth every 8 (eight) hours as needed for nausea or vomiting. 12/15/21   [name], PA-C  sertraline (ZOLOFT) 50 MG tablet TAKE 1 TABLET(50 MG) BY MOUTH DAILY 12/04/21   Hoy Register, MD  traZODone (DESYREL) 50 MG tablet Take 0.5-1 tablets (25-50 mg total) by mouth at bedtime as needed for sleep. 01/05/22   Hoy Register, MD  medroxyPROGESTERone (PROVERA) 5 MG tablet Take 2 tablets (10 mg total) by mouth daily. Patient not taking: Reported on 07/20/2018 06/30/18 12/06/18  Geoffery Lyons, MD    Family History Family History  Problem Relation Age of Onset   Hypertension Mother    Diabetes Mother    Asthma Father    Hypertension Father    Stroke Maternal Grandmother    Diabetes Paternal Grandmother    Hypertension Paternal Grandmother    Colon cancer Paternal Grandmother    Colon cancer Paternal Aunt    Esophageal cancer Neg Hx    Stomach cancer Neg Hx    Rectal cancer Neg Hx    Social History Social History   Tobacco Use   Smoking status: Every Day    Packs/day: 0.50    Years: 9.00    Total pack years: 4.50    Types: Cigarettes   Smokeless tobacco: Never  Vaping Use   Vaping Use: Some days   Last attempt to quit: 08/18/2015   Devices: "rarely"  Substance Use Topics   Alcohol use: Yes   Drug use: Yes    Types: Marijuana    Comment: Smokes daily to treat migraines   Allergies   Prednisone  Review of Systems Review of Systems Pertinent findings revealed after performing a 14 point review of systems has been noted in the history of present illness.  Physical Exam Triage Vital Signs ED Triage Vitals  Enc Vitals Group     BP 02/17/21 0827 (!) 147/82     Pulse Rate 02/17/21 0827 72     Resp 02/17/21 0827 18     Temp 02/17/21 0827 98.3 F (36.8 C)     Temp Source 02/17/21 0827 Oral     SpO2 02/17/21 0827 98 %     Weight --      Height --      Head Circumference  --      Peak Flow --      Pain Score 02/17/21 0826 5     Pain Loc --      Pain Edu? --      Excl. in GC? --   No data found.  Updated Vital Signs BP (!) 135/90 (BP Location: Right Arm)   Pulse  86   Temp 98.8 F (37.1 C) (Oral)   Resp 18   SpO2 98%   Physical Exam Vitals and nursing note reviewed.  Constitutional:      General: She is not in acute distress.    Appearance: Normal appearance. She is not ill-appearing.  HENT:     Head: Normocephalic and atraumatic.  Eyes:     General: Lids are normal.        Right eye: No discharge.        Left eye: No discharge.     Extraocular Movements: Extraocular movements intact.     Conjunctiva/sclera: Conjunctivae normal.     Right eye: Right conjunctiva is not injected.     Left eye: Left conjunctiva is not injected.  Neck:     Trachea: Trachea and phonation normal.  Cardiovascular:     Rate and Rhythm: Normal rate and regular rhythm.     Pulses: Normal pulses.     Heart sounds: Normal heart sounds. No murmur heard.    No friction rub. No gallop.  Pulmonary:     Effort: Pulmonary effort is normal. No accessory muscle usage, prolonged expiration or respiratory distress.     Breath sounds: Normal breath sounds. No stridor, decreased air movement or transmitted upper airway sounds. No decreased breath sounds, wheezing, rhonchi or rales.  Chest:     Chest wall: No tenderness.  Genitourinary:    Comments: Patient politely declines pelvic exam today, patient provided a vaginal swab for testing. Musculoskeletal:        General: Normal range of motion.     Cervical back: Normal range of motion and neck supple. Normal range of motion.  Lymphadenopathy:     Cervical: No cervical adenopathy.  Skin:    General: Skin is warm and dry.     Findings: No erythema or rash.  Neurological:     General: No focal deficit present.     Mental Status: She is alert and oriented to person, place, and time.  Psychiatric:        Mood and Affect: Mood  normal.        Behavior: Behavior normal.     Visual Acuity Right Eye Distance:   Left Eye Distance:   Bilateral Distance:    Right Eye Near:   Left Eye Near:    Bilateral Near:     UC Couse / Diagnostics / Procedures:     Radiology No results found.  Procedures Procedures (including critical care time) EKG  Pending results:  Labs Reviewed  POCT URINALYSIS DIP (MANUAL ENTRY) - Abnormal; Notable for the following components:      Result Value   Blood, UA trace-lysed (*)    Leukocytes, UA Trace (*)    All other components within normal limits  POCT URINE PREGNANCY  CERVICOVAGINAL ANCILLARY ONLY    Medications Ordered in UC: Medications - No data to display  UC Diagnoses / Final Clinical Impressions(s)   I have reviewed the triage vital signs and the nursing notes.  Pertinent labs & imaging results that were available during my care of the patient were reviewed by me and considered in my medical decision making (see chart for details).    Final diagnoses:  Acute vaginitis  Possible exposure to STD  Screening examination for STD (sexually transmitted disease)    {LMSTDP:27058}  {LMUTIP:27060}  ED Prescriptions   None    PDMP not reviewed this encounter.  Disposition Upon Discharge:  Condition: stable for discharge home  Patient presented with concern for an acute illness with associated systemic symptoms and significant discomfort requiring urgent management. In my opinion, this is a condition that a prudent lay person (someone who possesses an average knowledge of health and medicine) may potentially expect to result in complications if not addressed urgently such as respiratory distress, impairment of bodily function or dysfunction of bodily organs.   As such, the patient has been evaluated and assessed, work-up was performed and treatment was provided in alignment with urgent care protocols and evidence based medicine.  Patient/parent/caregiver has been  advised that the patient may require follow up for further testing and/or treatment if the symptoms continue in spite of treatment, as clinically indicated and appropriate.  Routine symptom specific, illness specific and/or disease specific instructions were discussed with the patient and/or caregiver at length.  Prevention strategies for avoiding STD exposure were also discussed.  The patient will follow up with their current PCP if and as advised. If the patient does not currently have a PCP we will assist them in obtaining one.   The patient may need specialty follow up if the symptoms continue, in spite of conservative treatment and management, for further workup, evaluation, consultation and treatment as clinically indicated and appropriate.  Patient/parent/caregiver verbalized understanding and agreement of plan as discussed.  All questions were addressed during visit.  Please see discharge instructions below for further details of plan.  Discharge Instructions:   Discharge Instructions      The results of your vaginal swab STD test which screens for BV, yeast, gonorrhea, chlamydia and trichomonas will be made available to you once it is complete.  This typically takes 3 to 5 days.  Please note that we do not test for herpes virus unless you are having an active lesion concerning for herpes outbreak.  If this occurs, please return for evaluation.    The results of your STD screening will initially be posted to your MyChart account and, if any of your results are abnormal, you will receive a phone call regarding treatment.  Prescriptions, if any are needed, will be provided for you at your pharmacy.     Your urinalysis today was not concerning for urinary tract infection.   Your urine pregnancy test today is negative.  Please abstain from sexual intercourse of any kind, vaginal, oral or anal, until you have received the results of your STD testing.    Please remember that your body is  a [place] and you are the [place].  The only way to prevent transmission of sexually transmitted disease when having sexual intercourse is to use condoms.  Repeat sexually transmitted infections can cause scarring in your fallopian tubes which will interfere with your ability to have children.  Repeat exposures to sexually transmitted diseases can also increase your risk of contracting HPV, the human papilloma virus which causes cervical cancer and genital warts and HIV.   If you have not had complete resolution of your symptoms after completing treatment, please return for repeat evaluation.   Thank you for visiting urgent care today.  I appreciate the opportunity to participate in your care.       This office note has been dictated using Museum/gallery curator.  Unfortunately, this method of dictation can sometimes lead to typographical or grammatical errors.  I apologize for your inconvenience in advance if this occurs.  Please do not hesitate to reach out to me if clarification is needed.

## 2022-01-09 NOTE — ED Triage Notes (Signed)
Pt has white foul odor vaginal discharge x 4 days. Reports her sexual partner informed that he has another sexual partner, so patient concerned about STD.

## 2022-01-10 LAB — CERVICOVAGINAL ANCILLARY ONLY
Bacterial Vaginitis (gardnerella): NEGATIVE
Candida Glabrata: NEGATIVE
Candida Vaginitis: NEGATIVE
Chlamydia: NEGATIVE
Comment: NEGATIVE
Comment: NEGATIVE
Comment: NEGATIVE
Comment: NEGATIVE
Comment: NEGATIVE
Comment: NORMAL
Neisseria Gonorrhea: NEGATIVE
Trichomonas: NEGATIVE

## 2022-01-15 ENCOUNTER — Encounter: Payer: Self-pay | Admitting: Family Medicine

## 2022-01-15 ENCOUNTER — Telehealth: Payer: Medicaid Other | Admitting: Physician Assistant

## 2022-01-15 ENCOUNTER — Telehealth: Payer: Self-pay | Admitting: Emergency Medicine

## 2022-01-15 ENCOUNTER — Other Ambulatory Visit: Payer: Self-pay | Admitting: Family Medicine

## 2022-01-15 DIAGNOSIS — F5101 Primary insomnia: Secondary | ICD-10-CM

## 2022-01-15 DIAGNOSIS — G4701 Insomnia due to medical condition: Secondary | ICD-10-CM

## 2022-01-15 DIAGNOSIS — R0789 Other chest pain: Secondary | ICD-10-CM

## 2022-01-15 DIAGNOSIS — G4700 Insomnia, unspecified: Secondary | ICD-10-CM

## 2022-01-15 DIAGNOSIS — F419 Anxiety disorder, unspecified: Secondary | ICD-10-CM

## 2022-01-15 MED ORDER — TRAZODONE HCL 100 MG PO TABS: 100.0000 mg | ORAL_TABLET | Freq: Every evening | ORAL | 2 refills | Status: DC | PRN

## 2022-01-15 NOTE — Patient Instructions (Signed)
[name], thank you for joining [name], PA-C for today's virtual visit.  While this provider is not your primary care provider (PCP), if your PCP is located in our provider database this encounter information will be shared with them immediately following your visit.  Consent: (Patient) Ann Moran provided verbal consent for this virtual visit at the beginning of the encounter.  Current Medications:  Current Outpatient Medications:    amitriptyline (ELAVIL) 10 MG tablet, Take 1 tablet (10 mg total) by mouth at bedtime. For migraines, Disp: 30 tablet, Rfl: 1   amLODipine (NORVASC) 10 MG tablet, TAKE 1 TABLET(10 MG) BY MOUTH DAILY, Disp: 90 tablet, Rfl: 0   Blood Pressure Monitoring (BLOOD PRESSURE CUFF) MISC, Use to check blood pressure once daily., Disp: 1 each, Rfl: 0   busPIRone (BUSPAR) 15 MG tablet, Take 1 tablet (15 mg total) by mouth 2 (two) times daily., Disp: 60 tablet, Rfl: 2   cetirizine (ZYRTEC ALLERGY) 10 MG tablet, Take 1 tablet (10 mg total) by mouth at bedtime., Disp: 90 tablet, Rfl: 1   hydrOXYzine (ATARAX) 25 MG tablet, TAKE 1 TABLET(25 MG) BY MOUTH EVERY 8 HOURS AS NEEDED FOR ANXIETY, Disp: 60 tablet, Rfl: 0   losartan (COZAAR) 100 MG tablet, TAKE 1 TABLET(100 MG) BY MOUTH DAILY, Disp: 90 tablet, Rfl: 0   meloxicam (MOBIC) 7.5 MG tablet, Take 1 tablet (7.5 mg total) by mouth daily., Disp: 30 tablet, Rfl: 1   omeprazole (PRILOSEC) 40 MG capsule, Take 1 capsule (40 mg total) by mouth daily., Disp: 30 capsule, Rfl: 0   ondansetron (ZOFRAN-ODT) 4 MG disintegrating tablet, Take 1 tablet (4 mg total) by mouth every 8 (eight) hours as needed for nausea or vomiting., Disp: 20 tablet, Rfl: 0   sertraline (ZOLOFT) 50 MG tablet, TAKE 1 TABLET(50 MG) BY MOUTH DAILY, Disp: 30 tablet, Rfl: 1   traZODone (DESYREL) 100 MG tablet, Take 1 tablet (100 mg total) by mouth at bedtime as needed for sleep., Disp: 30 tablet, Rfl: 2   Medications ordered in this encounter:   No orders of the defined types were placed in this encounter.    *If you need refills on other medications prior to your next appointment, please contact your pharmacy*  Follow-Up: Call back or seek an in-person evaluation if the symptoms worsen or if the condition fails to improve as anticipated.  [facility] [phone]  Other Instructions Based on what you shared with me, I feel your condition warrants further evaluation as soon as possible at an Emergency department.   If you are having a true medical emergency please call 911.      Emergency Department-[facility]  Get Driving Directions  [phone]  [address]  [place], [place] [address]  Open 24/7/365      [facility] Emergency Department at [facility]  Get Driving Directions  [address]  [place], [place] [address]  Open 24/7/365    Emergency Department- [facility]  Get Driving Directions  [phone]  [address]  [place], [place] [address]  Open 24/7/365      [facility] at [facility]  Get Driving Directions  [phone]  [address]  [place], [place] [address]  Open 24/7/365    [facility]  Emergency Department- [facility]  Get Driving Directions  [phone]  [address]  [place], [place] [address]  Open 24/7/365    HIGH  [place]  Get Driving Directions  [address]  [place], [place] [address]  Open 24/7/365    [facility]  Emergency [facility]  Get Driving Directions  [phone]  [address]  [place], [place] [address]  Open 24/7/365      If you have been instructed to have an in-person evaluation today at a local Urgent Care facility, please use the link below. It will take you to a list of all of our available [facility],  including address, phone number and hours of operation. Please do not delay care.  [facility]  If you or a family member do not have a primary care provider, use the link below to schedule a visit and establish care. When you choose a [facility] primary care physician or advanced practice provider, you gain a long-term partner in health. Find a Primary Care Provider  Learn more about [facility]'s in-office and virtual care options: [facility]

## 2022-01-15 NOTE — Telephone Encounter (Signed)
Patient presents to office  to let PCP know thst the sleep medication is not working and would like a call back

## 2022-01-15 NOTE — Progress Notes (Signed)
Virtual Visit Consent   Ann Moran, you are scheduled for a virtual visit with a [facility] provider today. Just as with appointments in the office, your consent must be obtained to participate. Your consent will be active for this visit and any virtual visit you may have with one of our providers in the next 365 days. If you have a MyChart account, a copy of this consent can be sent to you electronically.  As this is a virtual visit, video technology does not allow for your provider to perform a traditional examination. This may limit your provider's ability to fully assess your condition. If your provider identifies any concerns that need to be evaluated in person or the need to arrange testing (such as labs, EKG, etc.), we will make arrangements to do so. Although advances in technology are sophisticated, we cannot ensure that it will always work on either your end or our end. If the connection with a video visit is poor, the visit may have to be switched to a telephone visit. With either a video or telephone visit, we are not always able to ensure that we have a secure connection.  By engaging in this virtual visit, you consent to the provision of healthcare and authorize for your insurance to be billed (if applicable) for the services provided during this visit. Depending on your insurance coverage, you may receive a charge related to this service.  I need to obtain your verbal consent now. Are you willing to proceed with your visit today? Ann Moran has provided verbal consent on 01/15/2022 for a virtual visit (video or telephone). Mar Daring, PA-C  Date: 01/15/2022 7:40 PM  Virtual Visit via Video Note   I, Mar Daring, connected with  Ann Moran  ([id number], 02-18-1986) on 01/15/22 at  7:30 PM EDT by a video-enabled telemedicine application and verified that I am speaking with the correct person using two identifiers.  Location: Patient: Virtual Visit Location  Patient: Home Provider: Virtual Visit Location Provider: Home Office   I discussed the limitations of evaluation and management by telemedicine and the availability of in person appointments. The patient expressed understanding and agreed to proceed.    History of Present Illness: Ann Moran is a 36 y.o. who identifies as a female who was assigned female at birth, and is being seen today for acute anxiety and left sided chest pain. Has not slept since Saturday night. Tonight started having left sided chest pain that comes and goes, but is associated with shortness of breath and mild nausea.   Problems:  Patient Active Problem List   Diagnosis Date Noted   Smoking 02/23/2021   Patellar tendinitis of both knees 01/27/2021   Pain in joint involving right ankle and foot 01/27/2021   Thoracic back pain 12/23/2020   Right elbow pain 12/23/2020   Chronic pain of right knee 12/23/2020   Ankle fracture, left 08/09/2020   Dysphagia 05/20/2019   Encounter for other preprocedural examination 05/20/2019   PCOS (polycystic ovarian syndrome) 02/24/2019   Neck pain 02/24/2019   Morbid obesity with body mass index (BMI) of 45.0 to 49.9 in adult [facility]) 02/24/2019   Essential hypertension 02/24/2019   Gastroesophageal reflux disease 02/24/2019   Irregular menstrual cycle 01/14/2019   Abnormal uterine bleeding 01/14/2019   Chronic cough 01/14/2019    Allergies:  Allergies  Allergen Reactions   Prednisone Swelling    Had neck and shoulder swelling following taking   Medications:  Current Outpatient Medications:    amitriptyline (ELAVIL) 10 MG tablet, Take 1 tablet (10 mg total) by mouth at bedtime. For migraines, Disp: 30 tablet, Rfl: 1   amLODipine (NORVASC) 10 MG tablet, TAKE 1 TABLET(10 MG) BY MOUTH DAILY, Disp: 90 tablet, Rfl: 0   Blood Pressure Monitoring (BLOOD PRESSURE CUFF) MISC, Use to check blood pressure once daily., Disp: 1 each, Rfl: 0   busPIRone (BUSPAR) 15 MG tablet, Take 1  tablet (15 mg total) by mouth 2 (two) times daily., Disp: 60 tablet, Rfl: 2   cetirizine (ZYRTEC ALLERGY) 10 MG tablet, Take 1 tablet (10 mg total) by mouth at bedtime., Disp: 90 tablet, Rfl: 1   hydrOXYzine (ATARAX) 25 MG tablet, TAKE 1 TABLET(25 MG) BY MOUTH EVERY 8 HOURS AS NEEDED FOR ANXIETY, Disp: 60 tablet, Rfl: 0   losartan (COZAAR) 100 MG tablet, TAKE 1 TABLET(100 MG) BY MOUTH DAILY, Disp: 90 tablet, Rfl: 0   meloxicam (MOBIC) 7.5 MG tablet, Take 1 tablet (7.5 mg total) by mouth daily., Disp: 30 tablet, Rfl: 1   omeprazole (PRILOSEC) 40 MG capsule, Take 1 capsule (40 mg total) by mouth daily., Disp: 30 capsule, Rfl: 0   ondansetron (ZOFRAN-ODT) 4 MG disintegrating tablet, Take 1 tablet (4 mg total) by mouth every 8 (eight) hours as needed for nausea or vomiting., Disp: 20 tablet, Rfl: 0   sertraline (ZOLOFT) 50 MG tablet, TAKE 1 TABLET(50 MG) BY MOUTH DAILY, Disp: 30 tablet, Rfl: 1   traZODone (DESYREL) 100 MG tablet, Take 1 tablet (100 mg total) by mouth at bedtime as needed for sleep., Disp: 30 tablet, Rfl: 2  Observations/Objective: Patient is well-developed, well-nourished in no acute distress.  Resting comfortably at home.  Head is normocephalic, atraumatic.  No labored breathing.  Speech is clear and coherent with logical content.  Patient is alert and oriented at baseline.    Assessment and Plan: 1. Other chest pain  2. Acute anxiety  3. Primary insomnia  - Since patient is having left sided chest pain with anxiety and has not slept since Saturday I have advised for her to seek in person evaluation immediately at the ER to rule out any acute cardiac cause and to possibly have acute anxiety treated (has hydroxyzine and amitriptyline that has not helped; also on trazodone 50mg -just increased to 100mg  but has not been able to take that dose yet as just prescribed today)   Follow Up Instructions: I discussed the assessment and treatment plan with the patient. The patient was  provided an opportunity to ask questions and all were answered. The patient agreed with the plan and demonstrated an understanding of the instructions.  A copy of instructions were sent to the patient via MyChart unless otherwise noted below.    The patient was advised to call back or seek an in-person evaluation if the symptoms worsen or if the condition fails to improve as anticipated.  Time:  I spent 11 minutes with the patient via telehealth technology discussing the above problems/concerns.    , PA-C

## 2022-01-16 ENCOUNTER — Ambulatory Visit
Admission: RE | Admit: 2022-01-16 | Discharge: 2022-01-16 | Disposition: A | Discharge status: Home / Self Care | Payer: Medicaid Other | Source: Ambulatory Visit | Attending: Urgent Care | Admitting: Urgent Care

## 2022-01-16 ENCOUNTER — Other Ambulatory Visit: Payer: Self-pay

## 2022-01-16 ENCOUNTER — Emergency Department (HOSPITAL_COMMUNITY): Payer: Medicaid Other

## 2022-01-16 ENCOUNTER — Emergency Department (HOSPITAL_COMMUNITY)
Admission: EM | Admit: 2022-01-16 | Discharge: 2022-01-17 | Disposition: A | Discharge status: Home / Self Care | Payer: Medicaid Other | Attending: Emergency Medicine | Admitting: Emergency Medicine

## 2022-01-16 ENCOUNTER — Encounter (HOSPITAL_COMMUNITY): Payer: Self-pay

## 2022-01-16 VITALS — BP 165/97 | HR 119 | Temp 98.2°F | Resp 19

## 2022-01-16 DIAGNOSIS — B349 Viral infection, unspecified: Secondary | ICD-10-CM | POA: Insufficient documentation

## 2022-01-16 DIAGNOSIS — Z1152 Encounter for screening for COVID-19: Secondary | ICD-10-CM | POA: Insufficient documentation

## 2022-01-16 DIAGNOSIS — R9431 Abnormal electrocardiogram [ECG] [EKG]: Secondary | ICD-10-CM | POA: Insufficient documentation

## 2022-01-16 DIAGNOSIS — I10 Essential (primary) hypertension: Secondary | ICD-10-CM | POA: Insufficient documentation

## 2022-01-16 DIAGNOSIS — G4700 Insomnia, unspecified: Secondary | ICD-10-CM | POA: Diagnosis not present

## 2022-01-16 DIAGNOSIS — Z79899 Other long term (current) drug therapy: Secondary | ICD-10-CM | POA: Insufficient documentation

## 2022-01-16 DIAGNOSIS — R000 Tachycardia, unspecified: Secondary | ICD-10-CM | POA: Insufficient documentation

## 2022-01-16 DIAGNOSIS — F419 Anxiety disorder, unspecified: Secondary | ICD-10-CM | POA: Insufficient documentation

## 2022-01-16 DIAGNOSIS — R944 Abnormal results of kidney function studies: Secondary | ICD-10-CM | POA: Insufficient documentation

## 2022-01-16 DIAGNOSIS — R0789 Other chest pain: Secondary | ICD-10-CM | POA: Insufficient documentation

## 2022-01-16 DIAGNOSIS — Z20822 Contact with and (suspected) exposure to covid-19: Secondary | ICD-10-CM | POA: Insufficient documentation

## 2022-01-16 DIAGNOSIS — E669 Obesity, unspecified: Secondary | ICD-10-CM | POA: Insufficient documentation

## 2022-01-16 DIAGNOSIS — R110 Nausea: Secondary | ICD-10-CM | POA: Insufficient documentation

## 2022-01-16 DIAGNOSIS — E119 Type 2 diabetes mellitus without complications: Secondary | ICD-10-CM | POA: Diagnosis not present

## 2022-01-16 DIAGNOSIS — R630 Anorexia: Secondary | ICD-10-CM | POA: Insufficient documentation

## 2022-01-16 LAB — CBC WITH DIFFERENTIAL/PLATELET
Abs Immature Granulocytes: 0.02 10*3/uL (ref 0.00–0.07)
Basophils Absolute: 0.1 10*3/uL (ref 0.0–0.1)
Basophils Relative: 1 %
Eosinophils Absolute: 0.2 10*3/uL (ref 0.0–0.5)
Eosinophils Relative: 3 %
HCT: 39.5 % (ref 36.0–46.0)
Hemoglobin: 13.8 g/dL (ref 12.0–15.0)
Immature Granulocytes: 0 %
Lymphocytes Relative: 36 %
Lymphs Abs: 2.8 10*3/uL (ref 0.7–4.0)
MCH: 30.7 pg (ref 26.0–34.0)
MCHC: 34.9 g/dL (ref 30.0–36.0)
MCV: 88 fL (ref 80.0–100.0)
Monocytes Absolute: 0.6 10*3/uL (ref 0.1–1.0)
Monocytes Relative: 8 %
Neutro Abs: 4.1 10*3/uL (ref 1.7–7.7)
Neutrophils Relative %: 52 %
Platelets: 342 10*3/uL (ref 150–400)
RBC: 4.49 MIL/uL (ref 3.87–5.11)
RDW: 12.9 % (ref 11.5–15.5)
WBC: 7.8 10*3/uL (ref 4.0–10.5)
nRBC: 0 % (ref 0.0–0.2)

## 2022-01-16 LAB — TROPONIN I (HIGH SENSITIVITY)
Troponin I (High Sensitivity): 9 ng/L (ref <18)
Troponin I (High Sensitivity): 8 ng/L (ref <18)

## 2022-01-16 LAB — RESP PANEL BY RT-PCR (FLU A&B, COVID) ARPGX2
Influenza A by PCR: NEGATIVE
Influenza B by PCR: NEGATIVE
SARS Coronavirus 2 by RT PCR: NEGATIVE

## 2022-01-16 LAB — COMPREHENSIVE METABOLIC PANEL
ALT: 23 U/L (ref 0–44)
AST: 17 U/L (ref 15–41)
Albumin: 4.3 g/dL (ref 3.5–5.0)
Alkaline Phosphatase: 59 U/L (ref 38–126)
Anion gap: 18 — ABNORMAL HIGH (ref 5–15)
BUN: 11 mg/dL (ref 6–20)
CO2: 17 mmol/L — ABNORMAL LOW (ref 22–32)
Calcium: 9.2 mg/dL (ref 8.9–10.3)
Chloride: 103 mmol/L (ref 98–111)
Creatinine, Ser: 1.37 mg/dL — ABNORMAL HIGH (ref 0.44–1.00)
GFR, Estimated: 51 mL/min — ABNORMAL LOW (ref >60)
Glucose, Bld: 102 mg/dL — ABNORMAL HIGH (ref 70–99)
Potassium: 3.3 mmol/L — ABNORMAL LOW (ref 3.5–5.1)
Sodium: 138 mmol/L (ref 135–145)
Total Bilirubin: 0.7 mg/dL (ref 0.3–1.2)
Total Protein: 7.2 g/dL (ref 6.5–8.1)

## 2022-01-16 LAB — SARS CORONAVIRUS 2 BY RT PCR: SARS Coronavirus 2 by RT PCR: NEGATIVE

## 2022-01-16 LAB — D-DIMER, QUANTITATIVE: D-Dimer, Quant: <0.27 ug/mL-FEU (ref 0.00–0.50)

## 2022-01-16 MED ORDER — ASPIRIN 81 MG PO CHEW
324.0000 mg | CHEWABLE_TABLET | Freq: Once | ORAL | Status: AC
  Administered 2022-01-16: 324 mg via ORAL

## 2022-01-16 MED ORDER — TIZANIDINE HCL 4 MG PO TABS: 4.0000 mg | ORAL_TABLET | Freq: Every day | ORAL | 0 refills | Status: DC

## 2022-01-16 MED ORDER — SODIUM CHLORIDE 0.9 % IV BOLUS
1000.0000 mL | Freq: Once | INTRAVENOUS | Status: AC
  Administered 2022-01-16: 1000 mL via INTRAVENOUS

## 2022-01-16 MED ORDER — ACETAMINOPHEN 325 MG PO TABS: 650.0000 mg | ORAL_TABLET | Freq: Once | ORAL | Status: DC

## 2022-01-16 NOTE — ED Provider Notes (Signed)
[facility] EMERGENCY DEPARTMENT Provider Note   CSN: [id number] Arrival date & time: 01/16/22  1527     History  Chief Complaint  Patient presents with   Chest Pain    Ann Moran is a 36 y.o. female.  HPI Patient presents with left  Sternal chest discomfort.  Onset was 4 days ago, since that time pain has been persistent with associated anorexia, nausea, discomfort, no syncope, no fever.  No history of cardiac disease.  Today she went to urgent care and was sent here for evaluation due to abnormal ECG.    Home Medications Prior to Admission medications   Medication Sig Start Date End Date Taking? Authorizing Provider  amitriptyline (ELAVIL) 10 MG tablet Take 1 tablet (10 mg total) by mouth at bedtime. For migraines 01/03/22   [name], MD  amLODipine (NORVASC) 10 MG tablet TAKE 1 TABLET(10 MG) BY MOUTH DAILY 12/28/21   [name], MD  Blood Pressure Monitoring (BLOOD PRESSURE CUFF) MISC Use to check blood pressure once daily. 08/29/21   [name], MD  busPIRone (BUSPAR) 15 MG tablet Take 1 tablet (15 mg total) by mouth 2 (two) times daily. 09/14/21   [name], PA-C  cetirizine (ZYRTEC ALLERGY) 10 MG tablet Take 1 tablet (10 mg total) by mouth at bedtime. 07/04/21 12/31/21  [name], PA-C  hydrOXYzine (ATARAX) 25 MG tablet TAKE 1 TABLET(25 MG) BY MOUTH EVERY 8 HOURS AS NEEDED FOR ANXIETY 11/28/21   [name], MD  losartan (COZAAR) 100 MG tablet TAKE 1 TABLET(100 MG) BY MOUTH DAILY 12/28/21   [name], MD  meloxicam (MOBIC) 7.5 MG tablet Take 1 tablet (7.5 mg total) by mouth daily. 01/03/22   [name], MD  omeprazole (PRILOSEC) 40 MG capsule Take 1 capsule (40 mg total) by mouth daily. 09/15/21   [name], MD  ondansetron (ZOFRAN-ODT) 4 MG disintegrating tablet Take 1 tablet (4 mg total) by mouth every 8 (eight) hours as needed for nausea or vomiting. 12/15/21   Mar Daring, PA-C  sertraline  (ZOLOFT) 50 MG tablet TAKE 1 TABLET(50 MG) BY MOUTH DAILY 12/04/21   [name], MD  tiZANidine (ZANAFLEX) 4 MG tablet Take 1 tablet (4 mg total) by mouth at bedtime. 01/16/22   Jaynee Eagles, PA-C  traZODone (DESYREL) 100 MG tablet Take 1 tablet (100 mg total) by mouth at bedtime as needed for sleep. 01/15/22   [name], MD  medroxyPROGESTERone (PROVERA) 5 MG tablet Take 2 tablets (10 mg total) by mouth daily. Patient not taking: Reported on 07/20/2018 06/30/18 12/06/18  Veryl Speak, MD      Allergies    Prednisone    Review of Systems   Review of Systems  All other systems reviewed and are negative.   Physical Exam Updated Vital Signs BP 139/87   Pulse 92   Temp 98.5 F (36.9 C) (Oral)   Resp 20   Ht 5\' 9"  (1.753 m)   Wt (!) 139.3 kg   SpO2 100%   BMI 45.34 kg/m  Physical Exam Vitals and nursing note reviewed.  Constitutional:      General: She is not in acute distress.    Appearance: She is well-developed.  HENT:     Head: Normocephalic and atraumatic.  Eyes:     Conjunctiva/sclera: Conjunctivae normal.  Cardiovascular:     Rate and Rhythm: Normal rate and regular rhythm.  Pulmonary:     Effort: Pulmonary effort is normal. No respiratory distress.  Breath sounds: Normal breath sounds. No stridor.  Abdominal:     General: There is no distension.  Skin:    General: Skin is warm and dry.  Neurological:     Mental Status: She is alert and oriented to person, place, and time.     Cranial Nerves: No cranial nerve deficit.  Psychiatric:        Mood and Affect: Mood normal.     ED Results / Procedures / Treatments   Labs (all labs ordered are listed, but only abnormal results are displayed) Labs Reviewed  COMPREHENSIVE METABOLIC PANEL - Abnormal; Notable for the following components:      Result Value   Potassium 3.3 (*)    CO2 17 (*)    Glucose, Bld 102 (*)    Creatinine, Ser 1.37 (*)    GFR, Estimated 51 (*)    Anion gap 18 (*)    All other  components within normal limits  SARS CORONAVIRUS 2 BY RT PCR  CBC WITH DIFFERENTIAL/PLATELET  D-DIMER, QUANTITATIVE  TROPONIN I (HIGH SENSITIVITY)  TROPONIN I (HIGH SENSITIVITY)    EKG ECG with rate 110, ST-T wave abnormalities, anterior injury pattern, sinus tach, abnormal  Radiology DG Chest 2 View  Result Date: 01/16/2022 CLINICAL DATA:  36 year old female presenting for evaluation of chest pain. EXAM: CHEST - 2 VIEW COMPARISON:  April 05, 2021 FINDINGS: The heart size and mediastinal contours are within normal limits. Both lungs are clear. The visualized skeletal structures are unremarkable. IMPRESSION: No active cardiopulmonary disease. Electronically Signed   By: Donzetta Kohut M.D.   On: 01/16/2022 17:12    Procedures Procedures    Medications Ordered in ED Medications  acetaminophen (TYLENOL) tablet 650 mg (has no administration in time range)  sodium chloride 0.9 % bolus 1,000 mL (1,000 mLs Intravenous New Bag/Given 01/16/22 2109)    ED Course/ Medical Decision Making/ A&P This patient with a Hx of obesity, anxiety, hypertension, diabetes presents to the ED for concern of chest pain 4 days, this involves an extensive number of treatment options, and is a complaint that carries with it a high risk of complications and morbidity.    The differential diagnosis includes ACS pneumonia PE pneumothorax   Social Determinants of Health:  Obesity, diabetes, hypertension  Additional history obtained:  Additional history and/or information obtained from chart review, urgent care notes, notable for transfer for ST-T wave changes, ongoing chest pain   After the initial evaluation, orders, including: Labs monitoring were initiated.   Patient placed on Cardiac and Pulse-Oximetry Monitors. The patient was maintained on a cardiac monitor.  The cardiac monitored showed an rhythm of 95 sinus normal The patient was also maintained on pulse oximetry. The readings were typically  100% room air normal   On repeat evaluation of the patient stayed the same  Lab Tests:  I personally interpreted labs.  The pertinent results include: Renal dysfunction with creatinine almost doubled out of a few months ago D-dimer negative  Imaging Studies ordered:  I independently visualized and interpreted imaging which showed no pneumonia on x-ray I agree with the radiologist interpretation   Dispostion / Final MDM:  After consideration of the diagnostic results and the patient's response to treatment, obese young female with history of anxiety, depression, hypertension presents with chest pain for several days.  Here she is awake, alert, in no distress, speaking clearly.  Patient has no hypoxia, does have elevated creatinine, and with tach tachypnea, tachycardia some suspicion for dehydration, metabolic demand  contributing to her symptoms.  No evidence for ACS, PE, pneumonia, patient discharged in stable condition.   Final Clinical Impression(s) / ED Diagnoses Final diagnoses:  Atypical chest pain    Rx / DC Orders ED Discharge Orders     None         Gerhard Munch, MD 01/16/22 2232

## 2022-01-16 NOTE — ED Triage Notes (Signed)
Pt presents with complaints of feeling anxious at home. Pt tried hydroxyzine before and became very emotional.denies si.  Pt also complains of sore throat that started today.

## 2022-01-16 NOTE — ED Triage Notes (Signed)
Patient seen b y CP for chest pain and sob.  Reports it started Saturday.  Complains of indigestion and nausea reports intermittent diaphoresisl.  Denies pain radiation.

## 2022-01-16 NOTE — ED Provider Notes (Addendum)
[facility] - URGENT CARE CENTER  Note:  This document was prepared using Conservation officer, historic buildings and may include unintentional dictation errors.  MRN: [id number] DOB: 1986-03-04  Subjective:   Ann Moran is a 36 y.o. female presenting for 2 chief complaints.  Sick visit - reports acute onset today of malaise, throat pain, coughing.  Had 1 sick contact with her daughter who has been sick for about 10 days.  She is being managed for a recurrent sinus infection.  Patient does report that over the past 3 days she has felt some chest pain, heart racing and palpitations.  Chest pain waxes and wanes over mid-sternum, is moderate to severe, fairly constant. No history of heart conditions. Has essential hypertension, takes amlodipine, losartan. Smokes 1/2ppd.   Anxiety - reports that she needs help with her anxiety and insomnia.  Initially, patient states that she is not on any mental health medications except for hydroxyzine.  Chart review shows that she is actually being managed by her PCP for this with amitriptyline, Zoloft, trazodone.  Has previously tried buspirone and states that it did not work.  She reached out to them by phone call but has not heard back from them.  No drug use.  Chart review shows that she has had thyroid testing and has been normal.  No current facility-administered medications for this encounter.  Current Outpatient Medications:    amitriptyline (ELAVIL) 10 MG tablet, Take 1 tablet (10 mg total) by mouth at bedtime. For migraines, Disp: 30 tablet, Rfl: 1   amLODipine (NORVASC) 10 MG tablet, TAKE 1 TABLET(10 MG) BY MOUTH DAILY, Disp: 90 tablet, Rfl: 0   Blood Pressure Monitoring (BLOOD PRESSURE CUFF) MISC, Use to check blood pressure once daily., Disp: 1 each, Rfl: 0   busPIRone (BUSPAR) 15 MG tablet, Take 1 tablet (15 mg total) by mouth 2 (two) times daily., Disp: 60 tablet, Rfl: 2   cetirizine (ZYRTEC ALLERGY) 10 MG tablet, Take 1 tablet (10 mg total) by  mouth at bedtime., Disp: 90 tablet, Rfl: 1   hydrOXYzine (ATARAX) 25 MG tablet, TAKE 1 TABLET(25 MG) BY MOUTH EVERY 8 HOURS AS NEEDED FOR ANXIETY, Disp: 60 tablet, Rfl: 0   losartan (COZAAR) 100 MG tablet, TAKE 1 TABLET(100 MG) BY MOUTH DAILY, Disp: 90 tablet, Rfl: 0   meloxicam (MOBIC) 7.5 MG tablet, Take 1 tablet (7.5 mg total) by mouth daily., Disp: 30 tablet, Rfl: 1   omeprazole (PRILOSEC) 40 MG capsule, Take 1 capsule (40 mg total) by mouth daily., Disp: 30 capsule, Rfl: 0   ondansetron (ZOFRAN-ODT) 4 MG disintegrating tablet, Take 1 tablet (4 mg total) by mouth every 8 (eight) hours as needed for nausea or vomiting., Disp: 20 tablet, Rfl: 0   sertraline (ZOLOFT) 50 MG tablet, TAKE 1 TABLET(50 MG) BY MOUTH DAILY, Disp: 30 tablet, Rfl: 1   traZODone (DESYREL) 100 MG tablet, Take 1 tablet (100 mg total) by mouth at bedtime as needed for sleep., Disp: 30 tablet, Rfl: 2   Allergies  Allergen Reactions   Prednisone Swelling    Had neck and shoulder swelling following taking    Past Medical History:  Diagnosis Date   Allergy    Anemia    Anxiety    Chlamydia    Depression    hx of meds, none currently- doing ok   Diabetes mellitus without complication (HCC)    GERD (gastroesophageal reflux disease)    Infection due to trichomonas    MRSA (methicillin resistant staph  aureus) culture positive    Dec 2012   Obesity    Pregnancy induced hypertension    Urinary tract infection      Past Surgical History:  Procedure Laterality Date   CHOLECYSTECTOMY     ECTOPIC PREGNANCY SURGERY     ESOPHAGOGASTRODUODENOSCOPY  05/2019    Family History  Problem Relation Age of Onset   Hypertension Mother    Diabetes Mother    Asthma Father    Hypertension Father    Stroke Maternal Grandmother    Diabetes Paternal Grandmother    Hypertension Paternal Grandmother    Colon cancer Paternal Grandmother    Colon cancer Paternal Aunt    Esophageal cancer Neg Hx    Stomach cancer Neg Hx     Rectal cancer Neg Hx     Social History   Tobacco Use   Smoking status: Every Day    Packs/day: 0.50    Years: 9.00    Total pack years: 4.50    Types: Cigarettes   Smokeless tobacco: Never  Vaping Use   Vaping Use: Some days   Last attempt to quit: 08/18/2015   Devices: "rarely"  Substance Use Topics   Alcohol use: Yes   Drug use: Yes    Types: Marijuana    Comment: Smokes daily to treat migraines    ROS   Objective:   Vitals: BP (!) 165/97   Pulse (!) 119   Temp 98.2 F (36.8 C)   Resp 19   SpO2 96%   Physical Exam Constitutional:      General: She is not in acute distress.    Appearance: Normal appearance. She is well-developed and normal weight. She is not ill-appearing, toxic-appearing or diaphoretic.  HENT:     Head: Normocephalic and atraumatic.     Right Ear: Tympanic membrane, ear canal and external ear normal. No drainage or tenderness. No middle ear effusion. There is no impacted cerumen. Tympanic membrane is not erythematous.     Left Ear: Tympanic membrane, ear canal and external ear normal. No drainage or tenderness.  No middle ear effusion. There is no impacted cerumen. Tympanic membrane is not erythematous.     Nose: Congestion present. No rhinorrhea.     Mouth/Throat:     Mouth: Mucous membranes are moist. No oral lesions.     Pharynx: No pharyngeal swelling, oropharyngeal exudate, posterior oropharyngeal erythema or uvula swelling.     Tonsils: No tonsillar exudate or tonsillar abscesses.  Eyes:     General: No scleral icterus.       Right eye: No discharge.        Left eye: No discharge.     Extraocular Movements: Extraocular movements intact.     Right eye: Normal extraocular motion.     Left eye: Normal extraocular motion.     Conjunctiva/sclera: Conjunctivae normal.  Cardiovascular:     Rate and Rhythm: Normal rate and regular rhythm.     Heart sounds: Normal heart sounds. No murmur heard.    No friction rub. No gallop.  Pulmonary:      Effort: Pulmonary effort is normal. No respiratory distress.     Breath sounds: No stridor. No wheezing, rhonchi or rales.  Chest:     Chest wall: No tenderness.  Musculoskeletal:     Cervical back: Normal range of motion and neck supple.  Lymphadenopathy:     Cervical: No cervical adenopathy.  Skin:    General: Skin is warm and dry.  Neurological:  General: No focal deficit present.     Mental Status: She is alert and oriented to person, place, and time.  Psychiatric:        Thought Content: Thought content normal.        Judgment: Judgment normal.     Comments: Anxious demeanor, flat affect.    ED ECG REPORT   Date: 01/16/2022  EKG Time: 2:22 PM  Rate: 116bpm  Rhythm: sinus tachycardia,  ST depression in inferior lateral leads  Axis: normal  Intervals:none  ST&T Change: ST depression, T-wave in version in II, III, aVF, V3-V6  Narrative Interpretation: Sinus tachycardia at 116bpm with ST changes as above concerning for inferior lateral ischemia. These are changes from previous ecg's.    Assessment and Plan :   PDMP not reviewed this encounter.  1. Atypical chest pain   2. Acute viral syndrome   3. Tachycardia, unspecified   4. Anxiety   5. Insomnia, unspecified type   6. Obesity, unspecified classification, unspecified obesity type, unspecified whether serious comorbidity present   7. Essential hypertension   8. Nonspecific abnormal electrocardiogram (ECG) (EKG)    Significant ecg changes, discussed need for a higher level of care than we can provide in the urgent care setting. Patient is refusing to go to the hospital by EMS. I have discussed thoroughly the risks of delaying her treatment including death, patient verbalizes understanding. Her fiance is in agreement and will be presenting to the clinic now to transport patient to the hospital by personal vehicle. Needs rule out of an acute cardiopulmonary event, ACS, PE.   Emphasized need for close follow-up with her  PCP who is managing her mental health thoroughly.  The only medication that I believe is appropriate for me to offer would be tizanidine as a way to help her with her sleep.  Patient does not endorse SI, HI and therefore I do not believe that she is a risk to herself or others.  Maintain mental health medications otherwise.    Jaynee Eagles, PA-C 01/16/22 1451    Jaynee Eagles, [place] 01/16/22 1701

## 2022-01-16 NOTE — ED Provider Triage Note (Signed)
Emergency Medicine Provider Triage Evaluation Note  Ann Moran , a 36 y.o. female  was evaluated in triage.  Pt complains of chest pain. reports acute onset today of malaise, throat pain, coughing.  Had 1 sick contact with her daughter who has been sick for about 10 days.  She is being managed for a recurrent sinus infection.  Patient does report that over the past 3 days she has felt some chest pain, heart racing and palpitations.  Chest pain waxes and wanes over mid-sternum, is moderate to severe, fairly constant. No history of heart conditions. Has essential hypertension, takes amlodipine, losartan. Smokes 1/2ppd.  Patient denies history of DVT/PE, leg swelling, oral contraceptive use, recent surgeries or hospitalizations.  Seen in urgent care sent to the ER for further evaluation and management.  Review of Systems  Positive: Chest pain, cough, sob Negative: Fever, leg swelling,   Physical Exam  BP (!) 137/95 (BP Location: Left Arm)   Pulse (!) 113   Temp 98.3 F (36.8 C) (Oral)   Resp (!) 22   Ht 5\' 9"  (1.753 m)   Wt (!) 139.3 kg   SpO2 95%   BMI 45.34 kg/m  Gen:   Awake, no distress   Resp:  Normal effort  MSK:   Moves extremities without difficulty  Other:  Tachycardia noted.  Patient has lungs that are clear to auscultation bilaterally.  No lower extremity edema noted.  Radial pulses are 2+.  Medical Decision Making  Medically screening exam initiated at 4:44 PM.  Appropriate orders placed.  ZELL DOUCETTE was informed that the remainder of the evaluation will be completed by another provider, this initial triage assessment does not replace that evaluation, and the importance of remaining in the ED until their evaluation is complete.  Patient presents for chest pain.  Patient noted to be tachycardic and tachypneic.  Pain is reproducible on palpation of left chest wall.  Given the tachycardia patient is PERC positive.  D-dimer is pending.  Patient would also need ACS rule out  with troponins pending along with chest x-ray.  Currently hemodynamically stable at this time.   Doristine Devoid, PA-C 01/16/22 1724

## 2022-01-16 NOTE — ED Notes (Signed)
Patient is being discharged from the Urgent Care and sent to the Emergency Department via pov (pt refused ems) . Per Bess Harvest, patient is in need of higher level of care due to chest pain ekg changes. Patient is aware and verbalizes understanding of plan of care.  Vitals:   01/16/22 1353  BP: (!) 165/97  Pulse: (!) 119  Resp: 19  Temp: 98.2 F (36.8 C)  SpO2: 96%

## 2022-01-16 NOTE — Discharge Instructions (Addendum)
As discussed, your evaluation today has been largely reassuring.  But, it is important that you monitor your condition carefully, and do not hesitate to return to the ED if you develop new, or concerning changes in your condition.  Otherwise, please follow-up with your physician for appropriate ongoing care.  In addition, a referral has been made to our cardiology colleagues and they will contact you for additional follow-up care.

## 2022-01-16 NOTE — Discharge Instructions (Addendum)
You are refusing transport to the hospital by ambulance now as you are in need of a higher level of care than we can provide in the urgent care setting. I do not recommend this as I am concerned for your heart. Please have your family member drive you to [facility] now for further intervention.

## 2022-01-17 ENCOUNTER — Ambulatory Visit: Payer: Self-pay | Admitting: *Deleted

## 2022-01-17 NOTE — Telephone Encounter (Signed)
I have placed a referral to a sleep specialist and their office will contact her with an appointment since we have tried several options that have been ineffective.

## 2022-01-17 NOTE — Addendum Note (Signed)
Addended by: [name] on: 01/17/2022 06:25 PM   Modules accepted: Orders

## 2022-01-17 NOTE — Telephone Encounter (Signed)
Pt states that new medication is not working to help her sleep.

## 2022-01-17 NOTE — ED Notes (Signed)
Pt would like yo leave prior to completing bolus, states that she is feeling better

## 2022-01-17 NOTE — Telephone Encounter (Signed)
Upon transfer from agent call disconnected. NT called patient back  Chief Complaint: chest pain  Symptoms: left side chest pain constant, increased pain with taking deep breath in . Dizzy, shortness of breath.  Frequency: since yesterday  Pertinent Negatives: Patient denies sweating , no nausea Disposition: [x] ED /[] Urgent Care (no appt availability in office) / [] Appointment(In office/virtual)/ []  [facility]/ [] Home Care/ [] Refused Recommended Disposition /[] [facility]/ []  Follow-up with PCP Additional Notes:   Due to sx recommended to go back to ED . Unsure if patient will go .   Reason for Disposition  Taking a deep breath makes pain worse  Answer Assessment - Initial Assessment Questions 1. LOCATION: "Where does it hurt?"       Left side of chest  2. RADIATION: "Does the pain go anywhere else?" (e.g., into neck, jaw, arms, back)     No not today  3. ONSET: "When did the chest pain begin?" (Minutes, hours or days)      Ongoing since leaving ED 4. PATTERN: "Does the pain come and go, or has it been constant since it started?"  "Does it get worse with exertion?"      Constant  5. DURATION: "How long does it last" (e.g., seconds, minutes, hours)     Na  6. SEVERITY: "How bad is the pain?"  (e.g., Scale 1-10; mild, moderate, or severe)    - MILD (1-3): doesn't interfere with normal activities     - MODERATE (4-7): interferes with normal activities or awakens from sleep    - SEVERE (8-10): excruciating pain, unable to do any normal activities       Feels like "throbbing" 7. CARDIAC RISK FACTORS: "Do you have any history of heart problems or risk factors for heart disease?" (e.g., angina, prior heart attack; diabetes, high blood pressure, high cholesterol, smoker, or strong family history of heart disease)     See hx 8. PULMONARY RISK FACTORS: "Do you have any history of lung disease?"  (e.g., blood clots in lung, asthma, emphysema, birth control pills)      na 9. CAUSE: "What do you think is causing the chest pain?"     Not sure has referral to cardiology  10. OTHER SYMPTOMS: "Do you have any other symptoms?" (e.g., dizziness, nausea, vomiting, sweating, fever, difficulty breathing, cough)       Dizziness, shortness of breath , cough makes worse, and taking deep breath causes pain 11. PREGNANCY: "Is there any chance you are pregnant?" "When was your last menstrual period?"       na  Protocols used: Chest Pain-A-AH

## 2022-01-18 NOTE — Telephone Encounter (Signed)
Pt was called and informed of referral being placed. ?

## 2022-01-18 NOTE — Telephone Encounter (Signed)
Pt was called and a VM was left informing patient to return phone call to schedule an appointment. °

## 2022-01-19 ENCOUNTER — Ambulatory Visit: Payer: Medicaid Other | Attending: Physician Assistant | Admitting: Physician Assistant

## 2022-01-19 ENCOUNTER — Encounter: Payer: Self-pay | Admitting: Physician Assistant

## 2022-01-19 VITALS — BP 125/78 | HR 95 | Ht 69.0 in | Wt 300.6 lb

## 2022-01-19 DIAGNOSIS — R0683 Snoring: Secondary | ICD-10-CM | POA: Diagnosis not present

## 2022-01-19 DIAGNOSIS — R0789 Other chest pain: Secondary | ICD-10-CM

## 2022-01-19 DIAGNOSIS — I10 Essential (primary) hypertension: Secondary | ICD-10-CM

## 2022-01-19 NOTE — Progress Notes (Unsigned)
Cardiology Office Note:    Date:  01/20/2022   ID:  Ann Moran, DOB Oct 10, 1985, MRN [id number]  PCP:  [name], [place] Providers Cardiologist:  Donato Heinz, MD     Referring MD: Carmin Muskrat, MD   Chief Complaint  Patient presents with   Chest Pain    Seen for Dr. Gardiner Rhyme    History of Present Illness:    Ann Moran is a 36 y.o. female with a hx of hypertension, prediabetes, anxiety/depression, obesity and tobacco use.  Patient was initially referred due to a syncope and family history of cerebral aneurysm.  Patient was initially seen in July 2021, at the time, she reported she had 4 episodes of cough induced syncope.  Coughing is usually induced by smoking.  She reported racing heartbeat when she is upset.  Zio patch showed no significant arrhythmia in 2021.  Patient was last seen by Dr. Nechama Guard on 07/22/2020 at which time the time Dr. Nechama Guard recommended sleep study, echocardiogram and MRA of head.  Echocardiogram obtained on 08/26/2020 showed EF 65 to 70%, no regional wall motion abnormality, mild LVH, grade 1 DD, normal RVSP, no significant valve issue.  It does not appears that MRI was ever done.  More recently, patient was seen in the ED on 01/16/2022 for atypical chest pain.  The onset of the chest discomfort was 4 days prior to the ED visit and since that time, the chest pain was essentially constant.  Creatinine 1.37 which is higher than normal.  Potassium 3.3.  D-dimer negative.  CBC showed normal red blood cell count.  Despite prolonged chest pain, serial troponin negative x2.  COVID test negative.  Chest x-ray normal.  Patient presents today for post ED follow-up.  Her chest pain started last Friday and went away by Tuesday while in the emergency room.  Her chest is still sore at this time.  It is tender to touch.  She says when her chest pain first started, it was worse with deep inspiration, body rotation or palpation.  It sounds  more like musculoskeletal in etiology.  I do not recommend any further work-up for the atypical chest pain.  She does continue to snore and has daytime somnolence, I recommend proceed with split-night sleep study.  Otherwise, she can follow-up with Dr. Gardiner Rhyme in 1 year.  She likely has obstructive sleep apnea and that once her sleep study come back, she probably will need to follow-up with Dr. Claiborne Billings instead.  Past Medical History:  Diagnosis Date   Allergy    Anemia    Anxiety    Chlamydia    Depression    hx of meds, none currently- doing ok   Diabetes mellitus without complication (HCC)    GERD (gastroesophageal reflux disease)    Infection due to trichomonas    MRSA (methicillin resistant staph aureus) culture positive    Dec 2012   Obesity    Pregnancy induced hypertension    Urinary tract infection     Past Surgical History:  Procedure Laterality Date   CHOLECYSTECTOMY     ECTOPIC PREGNANCY SURGERY     ESOPHAGOGASTRODUODENOSCOPY  05/2019    Current Medications: Current Meds  Medication Sig   amitriptyline (ELAVIL) 10 MG tablet Take 1 tablet (10 mg total) by mouth at bedtime. For migraines   amLODipine (NORVASC) 10 MG tablet TAKE 1 TABLET(10 MG) BY MOUTH DAILY   Blood Pressure Monitoring (BLOOD PRESSURE CUFF) MISC Use to  check blood pressure once daily.   busPIRone (BUSPAR) 15 MG tablet Take 1 tablet (15 mg total) by mouth 2 (two) times daily.   hydrOXYzine (ATARAX) 25 MG tablet TAKE 1 TABLET(25 MG) BY MOUTH EVERY 8 HOURS AS NEEDED FOR ANXIETY   losartan (COZAAR) 100 MG tablet TAKE 1 TABLET(100 MG) BY MOUTH DAILY   meloxicam (MOBIC) 7.5 MG tablet Take 1 tablet (7.5 mg total) by mouth daily.   omeprazole (PRILOSEC) 40 MG capsule Take 1 capsule (40 mg total) by mouth daily.   ondansetron (ZOFRAN-ODT) 4 MG disintegrating tablet Take 1 tablet (4 mg total) by mouth every 8 (eight) hours as needed for nausea or vomiting.   sertraline (ZOLOFT) 50 MG tablet TAKE 1 TABLET(50 MG)  BY MOUTH DAILY   tiZANidine (ZANAFLEX) 4 MG tablet Take 1 tablet (4 mg total) by mouth at bedtime.   traZODone (DESYREL) 100 MG tablet Take 1 tablet (100 mg total) by mouth at bedtime as needed for sleep.     Allergies:   Prednisone   Social History   Socioeconomic History   Marital status: Single    Spouse name: Not on file   Number of children: Not on file   Years of education: Not on file   Highest education level: Not on file  Occupational History   Not on file  Tobacco Use   Smoking status: Every Day    Packs/day: 0.50    Years: 9.00    Total pack years: 4.50    Types: Cigarettes   Smokeless tobacco: Never  Vaping Use   Vaping Use: Some days   Last attempt to quit: 08/18/2015   Devices: "rarely"  Substance and Sexual Activity   Alcohol use: Yes   Drug use: Yes    Types: Marijuana    Comment: Smokes daily to treat migraines   Sexual activity: Not Currently  Other Topics Concern   Not on file  Social History Narrative   Not on file   Social Determinants of Health   Financial Resource Strain: Not on file  Food Insecurity: No Food Insecurity (03/14/2020)   Hunger Vital Sign    Worried About Running Out of Food in the Last Year: Never true    Ran Out of Food in the Last Year: Never true  Transportation Needs: No Transportation Needs (03/14/2020)   PRAPARE - Hydrologist (Medical): No    Lack of Transportation (Non-Medical): No  Physical Activity: Not on file  Stress: Stress Concern Present (12/20/2021)   [place]    Feeling of Stress : Very much  Social Connections: Not on file     Family History: The patient's family history includes Asthma in her father; Colon cancer in her paternal aunt and paternal grandmother; Diabetes in her mother and paternal grandmother; Hypertension in her father, mother, and paternal grandmother; Stroke in her maternal grandmother. There  is no history of Esophageal cancer, Stomach cancer, or Rectal cancer.  ROS:   Please see the history of present illness.     All other systems reviewed and are negative.  EKGs/Labs/Other Studies Reviewed:    The following studies were reviewed today:  Echo 08/26/2020  1. Left ventricular ejection fraction, by estimation, is 65 to 70%. The  left ventricle has normal function. The left ventricle has no regional  wall motion abnormalities. There is mild left ventricular hypertrophy.  Left ventricular diastolic parameters  are consistent with Grade I  diastolic dysfunction (impaired relaxation).   2. Right ventricular systolic function is normal. The right ventricular  size is normal. There is normal pulmonary artery systolic pressure. The  estimated right ventricular systolic pressure is [id number] mmHg.   3. The mitral valve is grossly normal. No evidence of mitral valve  regurgitation.   4. The aortic valve is tricuspid. Aortic valve regurgitation is not  visualized.   5. The inferior vena cava is normal in size with greater than 50%  respiratory variability, suggesting right atrial pressure of 3 mmHg.   Comparison(s): No prior Echocardiogram.   EKG:  EKG is ordered today.  The ekg ordered today demonstrates normal sinus rhythm, no significant ST-T wave changes.  Recent Labs: 01/16/2022: ALT 23; BUN 11; Creatinine, Ser 1.37; Hemoglobin 13.8; Platelets 342; Potassium 3.3; Sodium 138  Recent Lipid Panel No results found for: "CHOL", "TRIG", "HDL", "CHOLHDL", "VLDL", "LDLCALC", "LDLDIRECT"   Risk Assessment/Calculations:           Physical Exam:    VS:  BP 125/78   Pulse 95   Ht 5\' 9"  (1.753 m)   Wt (!) 300 lb 9.6 oz (136.4 kg)   SpO2 97%   BMI 44.39 kg/m         Wt Readings from Last 3 Encounters:  01/19/22 (!) 300 lb 9.6 oz (136.4 kg)  01/16/22 (!) 307 lb (139.3 kg)  01/03/22 (!) 307 lb 3.2 oz (139.3 kg)     GEN:  Well nourished, well developed in no acute  distress HEENT: Normal NECK: No JVD; No carotid bruits LYMPHATICS: No lymphadenopathy CARDIAC: RRR, no murmurs, rubs, gallops RESPIRATORY:  Clear to auscultation without rales, wheezing or rhonchi  ABDOMEN: Soft, non-tender, non-distended MUSCULOSKELETAL:  No edema; No deformity  SKIN: Warm and dry NEUROLOGIC:  Alert and oriented x 3 PSYCHIATRIC:  Normal affect   ASSESSMENT:    1. Atypical chest pain   2. Snoring   3. Essential hypertension    PLAN:    In order of problems listed above:  Atypical chest pain: Despite prolonged chest pain, recent troponin in the emergency room was negative.  Her chest pain was worse with deep inspiration, body rotation or palpation.  I suspect her chest pain was musculoskeletal in etiology  Snoring: With daytime somnolence.  Proceed with split-night study.  Patient had a previous sleep study in 2021 however never undergone CPAP therapy  Hypertension: Blood pressure stable.           Medication Adjustments/Labs and Tests Ordered: Current medicines are reviewed at length with the patient today.  Concerns regarding medicines are outlined above.  Orders Placed This Encounter  Procedures   EKG 12-Lead   Split night study   No orders of the defined types were placed in this encounter.   Patient Instructions  Medication Instructions:  Your physician recommends that you continue on your current medications as directed. Please refer to the Current Medication list given to you today.   *If you need a refill on your cardiac medications before your next appointment, please call your pharmacy*   Lab Work: NONE ordered at this time of appointment   If you have labs (blood work) drawn today and your tests are completely normal, you will receive your results only by: [place] (if you have MyChart) OR A paper copy in the mail If you have any lab test that is abnormal or we need to change your treatment, we will call you to review the  results.  Testing/Procedures: Your physician has recommended that you have a sleep study. This test records several body functions during sleep, including: brain activity, eye movement, oxygen and carbon dioxide blood levels, heart rate and rhythm, breathing rate and rhythm, the flow of air through your mouth and nose, snoring, body muscle movements, and chest and belly movement.    Follow-Up: At [facility], you and your health needs are our priority.  As part of our continuing mission to provide you with exceptional heart care, we have created designated Provider Care Teams.  These Care Teams include your primary Cardiologist (physician) and Advanced Practice Providers (APPs -  Physician Assistants and Nurse Practitioners) who all work together to provide you with the care you need, when you need it.  We recommend signing up for the patient portal called "MyChart".  Sign up information is provided on this After Visit Summary.  MyChart is used to connect with patients for Virtual Visits (Telemedicine).  Patients are able to view lab/test results, encounter notes, upcoming appointments, etc.  Non-urgent messages can be sent to your provider as well.   To learn more about what you can do with MyChart, go to [email or web address].    Your next appointment:   1 year(s)  The format for your next appointment:   In Person  Provider:   Dr. Gardiner Rhyme    Other Instructions   Important Information About Sugar         Signed, Almyra Deforest, [place]  01/20/2022 11:36 PM    [place]

## 2022-01-19 NOTE — Patient Instructions (Signed)
Medication Instructions:  Your physician recommends that you continue on your current medications as directed. Please refer to the Current Medication list given to you today.   *If you need a refill on your cardiac medications before your next appointment, please call your pharmacy*   Lab Work: NONE ordered at this time of appointment   If you have labs (blood work) drawn today and your tests are completely normal, you will receive your results only by: [place] (if you have MyChart) OR A paper copy in the mail If you have any lab test that is abnormal or we need to change your treatment, we will call you to review the results.   Testing/Procedures: Your physician has recommended that you have a sleep study. This test records several body functions during sleep, including: brain activity, eye movement, oxygen and carbon dioxide blood levels, heart rate and rhythm, breathing rate and rhythm, the flow of air through your mouth and nose, snoring, body muscle movements, and chest and belly movement.    Follow-Up: At [facility], you and your health needs are our priority.  As part of our continuing mission to provide you with exceptional heart care, we have created designated Provider Care Teams.  These Care Teams include your primary Cardiologist (physician) and Advanced Practice Providers (APPs -  Physician Assistants and Nurse Practitioners) who all work together to provide you with the care you need, when you need it.  We recommend signing up for the patient portal called "MyChart".  Sign up information is provided on this After Visit Summary.  MyChart is used to connect with patients for Virtual Visits (Telemedicine).  Patients are able to view lab/test results, encounter notes, upcoming appointments, etc.  Non-urgent messages can be sent to your provider as well.   To learn more about what you can do with MyChart, go to [email or web address].    Your next appointment:    1 year(s)  The format for your next appointment:   In Person  Provider:   Dr. Gardiner Rhyme    Other Instructions   Important Information About Sugar

## 2022-01-20 ENCOUNTER — Encounter: Payer: Self-pay | Admitting: Physician Assistant

## 2022-01-23 ENCOUNTER — Institutional Professional Consult (permissible substitution): Payer: Medicaid Other | Admitting: Nurse Practitioner

## 2022-01-24 ENCOUNTER — Telehealth: Payer: Self-pay | Admitting: *Deleted

## 2022-01-24 NOTE — Progress Notes (Addendum)
01/25/22- 36 yoF Smoker for sleep evaluation courtesy of Dr Hoy Register with concern of Insomnia Medical problem list includes  Tobacco Use, HTN, GERD, DM, PCOS, Chronic Cough, Anxiety/ Depression, Morbid Obesity,  NPSG 02/28/20 AHI 18.4/ hr, desaturation to 83%, body weight 300 lbs -Elavil, Buspar, Desyrel, Atarax, Note 3 ED visits in September, 5 since June. CPAP?? Epworth score-10 Body weight today-318 lbs Covid vax- Flu vax- Pregnant- denies ------Had HST 2 years ago, not currently on a CPAP machine. Has trouble falling and staying asleep. Pt wakes up throughout the night. Never got CPAP machine after sleep study. Family tells her of loud snore, witnessed gasping. 70 lb weight gain in last few years.  Blames heartburn for difficulty maintaining sleep and says meds don't help. Needs GI referral. Adjustable bed with head elevated. Bedtime 12-3AM, latency 2 hours, up 11AM. WASO 3-4 times.  Taking Elavil and has increased Trazodone to 200 mg at HS, which helps with sleep. DOE which she blames on weight gain. Denies heart/ lung problems. Smoking 1/2 ppd.  Addendum 03/23/22- Clarification. Known OSA, never treated and symptomatic with daytime tiredness, snoring, witnessed apnea. Needing updated sleep study to document. Anticipate she will need CPAP for management of obstructive sleep apnea.  Prior to Admission medications   Medication Sig Start Date End Date Taking? Authorizing Provider  amitriptyline (ELAVIL) 10 MG tablet Take 1 tablet (10 mg total) by mouth at bedtime. For migraines 01/03/22   Hoy Register, MD  amLODipine (NORVASC) 10 MG tablet TAKE 1 TABLET(10 MG) BY MOUTH DAILY 12/28/21   Hoy Register, MD  Blood Pressure Monitoring (BLOOD PRESSURE CUFF) MISC Use to check blood pressure once daily. 08/29/21   Hoy Register, MD  busPIRone (BUSPAR) 15 MG tablet Take 1 tablet (15 mg total) by mouth 2 (two) times daily. 09/14/21   Anders Simmonds, PA-C  hydrOXYzine (ATARAX) 25 MG tablet  TAKE 1 TABLET(25 MG) BY MOUTH EVERY 8 HOURS AS NEEDED FOR ANXIETY 11/28/21   Hoy Register, MD  losartan (COZAAR) 100 MG tablet TAKE 1 TABLET(100 MG) BY MOUTH DAILY 12/28/21   Hoy Register, MD  meloxicam (MOBIC) 7.5 MG tablet Take 1 tablet (7.5 mg total) by mouth daily. 01/03/22   Hoy Register, MD  sertraline (ZOLOFT) 50 MG tablet TAKE 1 TABLET(50 MG) BY MOUTH DAILY 12/04/21   Hoy Register, MD  tiZANidine (ZANAFLEX) 4 MG tablet Take 1 tablet (4 mg total) by mouth at bedtime. 01/16/22   Wallis Bamberg, PA-C  traZODone (DESYREL) 100 MG tablet Take 1 tablet (100 mg total) by mouth at bedtime as needed for sleep. 01/15/22   Hoy Register, MD  medroxyPROGESTERone (PROVERA) 5 MG tablet Take 2 tablets (10 mg total) by mouth daily. Patient not taking: Reported on 07/20/2018 06/30/18 12/06/18  Geoffery Lyons, MD   Past Medical History:  Diagnosis Date   Allergy    Anemia    Anxiety    Chlamydia    Depression    hx of meds, none currently- doing ok   Diabetes mellitus without complication (HCC)    GERD (gastroesophageal reflux disease)    Infection due to trichomonas    MRSA (methicillin resistant staph aureus) culture positive    Dec 2012   Obesity    Pregnancy induced hypertension    Urinary tract infection    Past Surgical History:  Procedure Laterality Date   CHOLECYSTECTOMY     ECTOPIC PREGNANCY SURGERY     ESOPHAGOGASTRODUODENOSCOPY  05/2019   Family History  Problem Relation Age of  Onset   Hypertension Mother    Diabetes Mother    Asthma Father    Hypertension Father    Stroke Maternal Grandmother    Diabetes Paternal Grandmother    Hypertension Paternal Grandmother    Colon cancer Paternal Grandmother    Colon cancer Paternal Aunt    Esophageal cancer Neg Hx    Stomach cancer Neg Hx    Rectal cancer Neg Hx    Social History   Socioeconomic History   Marital status: Single    Spouse name: Not on file   Number of children: Not on file   Years of education: Not on file    Highest education level: Not on file  Occupational History   Not on file  Tobacco Use   Smoking status: Every Day    Packs/day: 0.50    Years: 9.00    Total pack years: 4.50    Types: Cigarettes    Passive exposure: Past   Smokeless tobacco: Never   Tobacco comments:    Pt currently smoking 01/25/22 PAP  Vaping Use   Vaping Use: Some days   Last attempt to quit: 08/18/2015   Devices: "rarely"  Substance and Sexual Activity   Alcohol use: Yes   Drug use: Yes    Types: Marijuana    Comment: Smokes daily to treat migraines   Sexual activity: Not Currently  Other Topics Concern   Not on file  Social History Narrative   Not on file   Social Determinants of Health   Financial Resource Strain: Not on file  Food Insecurity: No Food Insecurity (03/14/2020)   Hunger Vital Sign    Worried About Running Out of Food in the Last Year: Never true    Ran Out of Food in the Last Year: Never true  Transportation Needs: No Transportation Needs (03/14/2020)   PRAPARE - Administrator, Civil Service (Medical): No    Lack of Transportation (Non-Medical): No  Physical Activity: Not on file  Stress: Stress Concern Present (12/20/2021)   [employer] of Occupational Health - Occupational Stress Questionnaire    Feeling of Stress : Very much  Social Connections: Not on file  Intimate Partner Violence: Not on file   ROS-see HPI   + = positive Constitutional:    weight loss, night sweats, fevers, chills, fatigue, lassitude. HEENT:    headaches, difficulty swallowing, tooth/dental problems, +sore throat,       +sneezing, itching, ear ache, nasal congestion, post nasal drip, snoring CV:    +chest pain, orthopnea, PND, swelling in lower extremities, anasarca,                                   dizziness, palpitations Resp:   shortness of breath with exertion or at rest.                productive cough,  + non-productive cough, coughing up of blood.              change in color of  mucus.  wheezing.   Skin:    rash or lesions. GI:  + heartburn, indigestion, abdominal pain, nausea, vomiting, diarrhea,                 change in bowel habits, loss of appetite GU: dysuria, change in color of urine, no urgency or frequency.   flank pain. MS:   joint pain, stiffness, decreased  range of motion, back pain. Neuro-     nothing unusual Psych:  change in mood or affect.  +depression or+ anxiety.   memory loss.  OBJ- Physical Exam General- Alert, Oriented, Affect-appropriate, Distress- none acute, + obese Skin- rash-none, lesions- none, excoriation- none Lymphadenopathy- none Head- atraumatic            Eyes- Gross vision intact, PERRLA, conjunctivae and secretions clear            Ears- Hearing, canals-normal            Nose- Clear, no-Septal dev, mucus, polyps, erosion, perforation             Throat- Mallampati III-IV , mucosa clear , drainage- none, tonsils- atrophic, +teeth Neck- flexible , trachea midline, no stridor , thyroid nl, carotid no bruit Chest - symmetrical excursion , unlabored           Heart/CV- RRR , no murmur , no gallop  , no rub, nl s1 s2                           - JVD- none , edema- none, stasis changes- none, varices- none           Lung- clear to P&A, wheeze- none, cough- none , dullness-none, rub- none           Chest wall-  Abd-  Br/ Gen/ Rectal- Not done, not indicated Extrem- cyanosis- none, clubbing, none, atrophy- none, strength- nl Neuro- grossly intact to observation

## 2022-01-24 NOTE — Telephone Encounter (Signed)
Prior Authorization for split night sent to [facility] via web portal. Auth Number [id number]. Valid dates 01/31/22 to 04/24/22.

## 2022-01-25 ENCOUNTER — Ambulatory Visit (INDEPENDENT_AMBULATORY_CARE_PROVIDER_SITE_OTHER): Payer: Medicaid Other | Admitting: Internal Medicine

## 2022-01-25 ENCOUNTER — Encounter: Payer: Self-pay | Admitting: Internal Medicine

## 2022-01-25 VITALS — BP 138/84 | HR 88 | Ht 69.0 in | Wt 318.4 lb

## 2022-01-25 DIAGNOSIS — G4730 Sleep apnea, unspecified: Secondary | ICD-10-CM

## 2022-01-25 DIAGNOSIS — G4733 Obstructive sleep apnea (adult) (pediatric): Secondary | ICD-10-CM | POA: Diagnosis not present

## 2022-01-25 DIAGNOSIS — K219 Gastro-esophageal reflux disease without esophagitis: Secondary | ICD-10-CM

## 2022-01-25 DIAGNOSIS — Z23 Encounter for immunization: Secondary | ICD-10-CM

## 2022-01-25 DIAGNOSIS — Z6841 Body Mass Index (BMI) 40.0 and over, adult: Secondary | ICD-10-CM

## 2022-01-25 NOTE — Assessment & Plan Note (Signed)
Not sure if this is related to her PCOS, but it will exacerbate OSA May need external life style support/ active intervention

## 2022-01-25 NOTE — Assessment & Plan Note (Signed)
Inadequate control and says this is waking her at night. Plan- referred to GI.

## 2022-01-25 NOTE — Patient Instructions (Signed)
Order- new DME, new CPAP auto 5-20, mask of choice, humidifier, supplies, AirView/ card  Order- refer to GI   dx GERD  Order- flu vax standard  Please call if we can help

## 2022-01-25 NOTE — Assessment & Plan Note (Signed)
Never started on CPAP. Worse now if anything after 70 lb weight gain. Medical problems, safe driving, treatment discussed. Plan- start CPAP

## 2022-01-28 ENCOUNTER — Ambulatory Visit: Payer: Medicaid Other

## 2022-01-29 ENCOUNTER — Ambulatory Visit: Payer: Self-pay | Admitting: *Deleted

## 2022-01-29 ENCOUNTER — Ambulatory Visit: Payer: Medicaid Other

## 2022-01-29 ENCOUNTER — Ambulatory Visit
Admission: EM | Admit: 2022-01-29 | Discharge: 2022-01-29 | Disposition: A | Discharge status: Home / Self Care | Payer: Medicaid Other | Attending: Urgent Care | Admitting: Urgent Care

## 2022-01-29 DIAGNOSIS — F1290 Cannabis use, unspecified, uncomplicated: Secondary | ICD-10-CM | POA: Diagnosis not present

## 2022-01-29 DIAGNOSIS — F17200 Nicotine dependence, unspecified, uncomplicated: Secondary | ICD-10-CM | POA: Diagnosis not present

## 2022-01-29 DIAGNOSIS — L739 Follicular disorder, unspecified: Secondary | ICD-10-CM

## 2022-01-29 DIAGNOSIS — J020 Streptococcal pharyngitis: Secondary | ICD-10-CM

## 2022-01-29 DIAGNOSIS — B349 Viral infection, unspecified: Secondary | ICD-10-CM

## 2022-01-29 DIAGNOSIS — Z1152 Encounter for screening for COVID-19: Secondary | ICD-10-CM | POA: Insufficient documentation

## 2022-01-29 LAB — POCT RAPID STREP A (OFFICE): Rapid Strep A Screen: NEGATIVE

## 2022-01-29 MED ORDER — PROMETHAZINE-DM 6.25-15 MG/5ML PO SYRP: 2.5000 mL | ORAL_SOLUTION | Freq: Three times a day (TID) | ORAL | 0 refills | Status: DC | PRN

## 2022-01-29 MED ORDER — BENZONATATE 100 MG PO CAPS: 100.0000 mg | ORAL_CAPSULE | Freq: Three times a day (TID) | ORAL | 0 refills | Status: DC | PRN

## 2022-01-29 MED ORDER — PSEUDOEPHEDRINE HCL 60 MG PO TABS: 60.0000 mg | ORAL_TABLET | Freq: Three times a day (TID) | ORAL | 0 refills | Status: DC | PRN

## 2022-01-29 MED ORDER — MUPIROCIN 2 % EX OINT: 1.0000 | TOPICAL_OINTMENT | Freq: Three times a day (TID) | CUTANEOUS | 0 refills | Status: DC

## 2022-01-29 MED ORDER — LEVOCETIRIZINE DIHYDROCHLORIDE 5 MG PO TABS: 5.0000 mg | ORAL_TABLET | Freq: Every evening | ORAL | 0 refills | Status: DC

## 2022-01-29 NOTE — ED Triage Notes (Addendum)
Patient presents to [facility] for sore throat and nasal congestion since 10/05. States she noted a knot under her chin area since last Friday. Took one dose of ibuprofen yesterday.   Denies fever.

## 2022-01-29 NOTE — ED Provider Notes (Addendum)
[facility]  Note:  This document was prepared using Conservation officer, historic buildingsDragon voice recognition software and may include unintentional dictation errors.  MRN: [id number] DOB: July 22, 1985  Subjective:   Fransico HimMelanie E Traynor is a 36 y.o. female presenting for 5-day history of sinus congestion, throat pain, coughing.  Patient wants a strep test and a COVID test.  Smokes 1/2ppd and smokes marijuana multiple times a week.  No current facility-administered medications for this encounter.  Current Outpatient Medications:    amitriptyline (ELAVIL) 10 MG tablet, Take 1 tablet (10 mg total) by mouth at bedtime. For migraines, Disp: 30 tablet, Rfl: 1   amLODipine (NORVASC) 10 MG tablet, TAKE 1 TABLET(10 MG) BY MOUTH DAILY, Disp: 90 tablet, Rfl: 0   Blood Pressure Monitoring (BLOOD PRESSURE CUFF) MISC, Use to check blood pressure once daily., Disp: 1 each, Rfl: 0   busPIRone (BUSPAR) 15 MG tablet, Take 1 tablet (15 mg total) by mouth 2 (two) times daily., Disp: 60 tablet, Rfl: 2   hydrOXYzine (ATARAX) 25 MG tablet, TAKE 1 TABLET(25 MG) BY MOUTH EVERY 8 HOURS AS NEEDED FOR ANXIETY, Disp: 60 tablet, Rfl: 0   losartan (COZAAR) 100 MG tablet, TAKE 1 TABLET(100 MG) BY MOUTH DAILY, Disp: 90 tablet, Rfl: 0   meloxicam (MOBIC) 7.5 MG tablet, Take 1 tablet (7.5 mg total) by mouth daily., Disp: 30 tablet, Rfl: 1   sertraline (ZOLOFT) 50 MG tablet, TAKE 1 TABLET(50 MG) BY MOUTH DAILY, Disp: 30 tablet, Rfl: 1   tiZANidine (ZANAFLEX) 4 MG tablet, Take 1 tablet (4 mg total) by mouth at bedtime., Disp: 30 tablet, Rfl: 0   traZODone (DESYREL) 100 MG tablet, Take 1 tablet (100 mg total) by mouth at bedtime as needed for sleep., Disp: 30 tablet, Rfl: 2   Allergies  Allergen Reactions   Prednisone Swelling    Had neck and shoulder swelling following taking    Past Medical History:  Diagnosis Date   Allergy    Anemia    Anxiety    Chlamydia    Depression    hx of meds, none currently- doing ok   Diabetes  mellitus without complication (HCC)    GERD (gastroesophageal reflux disease)    Infection due to trichomonas    MRSA (methicillin resistant staph aureus) culture positive    Dec 2012   Obesity    Pregnancy induced hypertension    Urinary tract infection      Past Surgical History:  Procedure Laterality Date   CHOLECYSTECTOMY     ECTOPIC PREGNANCY SURGERY     ESOPHAGOGASTRODUODENOSCOPY  05/2019    Family History  Problem Relation Age of Onset   Hypertension Mother    Diabetes Mother    Asthma Father    Hypertension Father    Stroke Maternal Grandmother    Diabetes Paternal Grandmother    Hypertension Paternal Grandmother    Colon cancer Paternal Grandmother    Colon cancer Paternal Aunt    Esophageal cancer Neg Hx    Stomach cancer Neg Hx    Rectal cancer Neg Hx     Social History   Tobacco Use   Smoking status: Every Day    Packs/day: 0.50    Years: 9.00    Total pack years: 4.50    Types: Cigarettes    Passive exposure: Past   Smokeless tobacco: Never   Tobacco comments:    Pt currently smoking 01/25/22 PAP  Vaping Use   Vaping Use: Some days   Last attempt  to quit: 08/18/2015   Devices: "rarely"  Substance Use Topics   Alcohol use: Yes   Drug use: Yes    Types: Marijuana    Comment: Smokes daily to treat migraines    ROS   Objective:   Vitals: BP 133/87 (BP Location: Right Arm)   Pulse 74   Temp 97.8 F (36.6 C) (Oral)   Resp 16   SpO2 96%   Physical Exam Constitutional:      General: She is not in acute distress.    Appearance: Normal appearance. She is well-developed. She is not ill-appearing, toxic-appearing or diaphoretic.  HENT:     Head: Normocephalic and atraumatic.     Nose: Nose normal.     Mouth/Throat:     Mouth: Mucous membranes are moist.  Eyes:     General: No scleral icterus.       Right eye: No discharge.        Left eye: No discharge.     Extraocular Movements: Extraocular movements intact.  Cardiovascular:      Rate and Rhythm: Normal rate and regular rhythm.     Heart sounds: Normal heart sounds. No murmur heard.    No friction rub. No gallop.  Pulmonary:     Effort: Pulmonary effort is normal. No respiratory distress.     Breath sounds: No stridor. No wheezing, rhonchi or rales.  Chest:     Chest wall: No tenderness.  Skin:    General: Skin is warm and dry.  Neurological:     General: No focal deficit present.     Mental Status: She is alert and oriented to person, place, and time.  Psychiatric:        Mood and Affect: Mood normal.        Behavior: Behavior normal.     DG Chest 2 View  Result Date: 01/16/2022 CLINICAL DATA:  36 year old female presenting for evaluation of chest pain. EXAM: CHEST - 2 VIEW COMPARISON:  April 05, 2021 FINDINGS: The heart size and mediastinal contours are within normal limits. Both lungs are clear. The visualized skeletal structures are unremarkable. IMPRESSION: No active cardiopulmonary disease. Electronically Signed   By: Donzetta Kohut M.D.   On: 01/16/2022 17:12   CT ABDOMEN PELVIS W CONTRAST  Result Date: 11/15/2021 CLINICAL DATA:  Acute nonlocalized abdominal pain. Back pain and abdominal pain starting 2 weeks ago. EXAM: CT ABDOMEN AND PELVIS WITH CONTRAST TECHNIQUE: Multidetector CT imaging of the abdomen and pelvis was performed using the standard protocol following bolus administration of intravenous contrast. RADIATION DOSE REDUCTION: This exam was performed according to the departmental dose-optimization program which includes automated exposure control, adjustment of the mA and/or kV according to patient size and/or use of iterative reconstruction technique. CONTRAST:  OMNIPAQUE IOHEXOL 300 MG/ML  SOLN COMPARISON:  None Available. FINDINGS: Lower chest: Lung bases are clear. Hepatobiliary: No focal liver abnormality is seen. No gallstones, gallbladder wall thickening, or biliary dilatation. Pancreas: Unremarkable. No pancreatic ductal dilatation  or surrounding inflammatory changes. Spleen: Normal in size without focal abnormality. Adrenals/Urinary Tract: Small calculus in the left renal pelvis measuring 2 mm diameter. No hydronephrosis or hydroureter. Nephrograms are symmetrical. No adrenal gland nodules. Bladder is normal. Stomach/Bowel: Stomach is within normal limits. Appendix appears normal. No evidence of bowel wall thickening, distention, or inflammatory changes. Vascular/Lymphatic: No significant vascular findings are present. No enlarged abdominal or pelvic lymph nodes. Reproductive: Uterus and bilateral adnexa are unremarkable. Other: No abdominal wall hernia or abnormality. No abdominopelvic  ascites. Musculoskeletal: No acute or significant osseous findings. IMPRESSION: 1. No evidence of bowel obstruction or inflammation. Appendix is normal. 2. Nonobstructing 3 mm stone in the left kidney. Electronically Signed   By: Burman Nieves M.D.   On: 11/15/2021 23:19     Recent Results (from the past 2160 hour(s))  Cervicovaginal ancillary only     Status: None   Collection Time: 11/07/21  9:07 AM  Result Value Ref Range   Neisseria Gonorrhea Negative    Chlamydia Negative    Trichomonas Negative    Bacterial Vaginitis (gardnerella) Negative    Candida Vaginitis Negative    Candida Glabrata Negative    Comment Normal Reference Range Candida Species - Negative    Comment Normal Reference Range Candida Galbrata - Negative    Comment Normal Reference Range Trichomonas - Negative    Comment Normal Reference Ranger Chlamydia - Negative    Comment      Normal Reference Range Neisseria Gonorrhea - Negative   Comment      Normal Reference Range Bacterial Vaginosis - Negative  POCT urinalysis dipstick     Status: Abnormal   Collection Time: 11/07/21  9:08 AM  Result Value Ref Range   Color, UA yellow yellow   Clarity, UA clear clear   Glucose, UA negative negative mg/dL   Bilirubin, UA negative negative   Ketones, POC UA negative  negative mg/dL   Spec Grav, UA 8.119 1.478 - 1.025   Blood, UA moderate (A) negative   pH, UA 7.0 5.0 - 8.0   Protein Ur, POC negative negative mg/dL   Urobilinogen, UA 1.0 0.2 or 1.0 E.U./dL   Nitrite, UA Negative Negative   Leukocytes, UA Negative Negative  POCT urine pregnancy     Status: None   Collection Time: 11/07/21  9:08 AM  Result Value Ref Range   Preg Test, Ur Negative Negative  Comprehensive metabolic panel     Status: Abnormal   Collection Time: 11/15/21 10:05 PM  Result Value Ref Range   Sodium 140 135 - 145 mmol/L   Potassium 3.6 3.5 - 5.1 mmol/L   Chloride 108 98 - 111 mmol/L   CO2 26 22 - 32 mmol/L   Glucose, Bld 104 (H) 70 - 99 mg/dL    Comment: Glucose reference range applies only to samples taken after fasting for at least 8 hours.   BUN 7 6 - 20 mg/dL   Creatinine, Ser 2.95 0.44 - 1.00 mg/dL   Calcium 8.7 (L) 8.9 - 10.3 mg/dL   Total Protein 6.8 6.5 - 8.1 g/dL   Albumin 3.6 3.5 - 5.0 g/dL   AST 14 (L) 15 - 41 U/L   ALT 16 0 - 44 U/L   Alkaline Phosphatase 48 38 - 126 U/L   Total Bilirubin 0.3 0.3 - 1.2 mg/dL   GFR, Estimated >62 >13 mL/min    Comment: (NOTE) Calculated using the CKD-EPI Creatinine Equation (2021)    Anion gap 6 5 - 15    Comment: Performed at [facility], [address] [place], [place] [address]  Lactic acid, plasma     Status: None   Collection Time: 11/15/21 10:05 PM  Result Value Ref Range   Lactic Acid, Venous 1.2 0.5 - 1.9 mmol/L    Comment: Performed at [facility], [address] [place], [place] [address]  CBC with Differential     Status: None   Collection Time: 11/15/21 10:05 PM  Result Value Ref  Range   WBC 8.3 4.0 - 10.5 K/uL   RBC 4.13 3.87 - 5.11 MIL/uL   Hemoglobin 12.5 12.0 - 15.0 g/dL   HCT 38.5 36.0 - 46.0 %   MCV 93.2 80.0 - 100.0 fL   MCH 30.3 26.0 - 34.0 pg   MCHC 32.5 30.0 - 36.0 g/dL   RDW 13.2 11.5 - 15.5 %   Platelets 293 150 - 400 K/uL   nRBC 0.0 0.0 - 0.2 %    Neutrophils Relative % 52 %   Neutro Abs 4.3 1.7 - 7.7 K/uL   Lymphocytes Relative 35 %   Lymphs Abs 2.9 0.7 - 4.0 K/uL   Monocytes Relative 7 %   Monocytes Absolute 0.6 0.1 - 1.0 K/uL   Eosinophils Relative 6 %   Eosinophils Absolute 0.5 0.0 - 0.5 K/uL   Basophils Relative 0 %   Basophils Absolute 0.0 0.0 - 0.1 K/uL   Immature Granulocytes 0 %   Abs Immature Granulocytes 0.03 0.00 - 0.07 K/uL    Comment: Performed at [facility], [place] [address] [place], [place] [address]  POCT urine pregnancy     Status: None   Collection Time: 01/09/22  9:18 AM  Result Value Ref Range   Preg Test, Ur Negative Negative  POCT urinalysis dipstick (new)     Status: Abnormal   Collection Time: 01/09/22  9:19 AM  Result Value Ref Range   Color, UA yellow yellow   Clarity, UA clear clear   Glucose, UA negative negative mg/dL   Bilirubin, UA negative negative   Ketones, POC UA negative negative mg/dL   Spec Grav, UA 1.010 1.010 - 1.025   Blood, UA trace-lysed (A) negative   pH, UA 6.5 5.0 - 8.0   Protein Ur, POC negative negative mg/dL   Urobilinogen, UA 0.2 0.2 or 1.0 E.U./dL   Nitrite, UA Negative Negative   Leukocytes, UA Trace (A) Negative  Cervicovaginal ancillary only     Status: None   Collection Time: 01/09/22  9:20 AM  Result Value Ref Range   Neisseria Gonorrhea Negative    Chlamydia Negative    Trichomonas Negative    Bacterial Vaginitis (gardnerella) Negative    Candida Vaginitis Negative    Candida Glabrata Negative    Comment      Normal Reference Range Bacterial Vaginosis - Negative   Comment Normal Reference Range Candida Species - Negative    Comment Normal Reference Range Candida Galbrata - Negative    Comment Normal Reference Range Trichomonas - Negative    Comment Normal Reference Ranger Chlamydia - Negative    Comment      Normal Reference Range Neisseria Gonorrhea - Negative  Resp Panel by RT-PCR (Flu A&B, Covid) Anterior Nasal Swab     Status:  None   Collection Time: 01/16/22  2:38 PM   Specimen: Anterior Nasal Swab  Result Value Ref Range   SARS Coronavirus 2 by RT PCR NEGATIVE NEGATIVE    Comment: (NOTE) SARS-CoV-2 target nucleic acids are NOT DETECTED.  The SARS-CoV-2 RNA is generally detectable in upper respiratory specimens during the acute phase of infection. The lowest concentration of SARS-CoV-2 viral copies this assay can detect is 138 copies/mL. A negative result does not preclude SARS-Cov-2 infection and should not be used as the sole basis for treatment or other patient management decisions. A negative result may occur with  improper specimen collection/handling, submission of specimen other than nasopharyngeal swab, presence of viral mutation(s) within the areas  targeted by this assay, and inadequate number of viral copies(<138 copies/mL). A negative result must be combined with clinical observations, patient history, and epidemiological information. The expected result is Negative.  Fact Sheet for Patients:  BloggerCourse.com  Fact Sheet for Healthcare Providers:  SeriousBroker.it  This test is no t yet approved or cleared by the [name] FDA and  has been authorized for detection and/or diagnosis of SARS-CoV-2 by FDA under an Emergency Use Authorization (EUA). This EUA will remain  in effect (meaning this test can be used) for the duration of the COVID-19 declaration under Section 564(b)(1) of the Act, 21 U.S.C.section 360bbb-3(b)(1), unless the authorization is terminated  or revoked sooner.       Influenza A by PCR NEGATIVE NEGATIVE   Influenza B by PCR NEGATIVE NEGATIVE    Comment: (NOTE) The Xpert Xpress SARS-CoV-2/FLU/RSV plus assay is intended as an aid in the diagnosis of influenza from Nasopharyngeal swab specimens and should not be used as a sole basis for treatment. Nasal washings and aspirates are unacceptable for Xpert Xpress  SARS-CoV-2/FLU/RSV testing.  Fact Sheet for Patients: BloggerCourse.com  Fact Sheet for Healthcare Providers: SeriousBroker.it  This test is not yet approved or cleared by the [name] FDA and has been authorized for detection and/or diagnosis of SARS-CoV-2 by FDA under an Emergency Use Authorization (EUA). This EUA will remain in effect (meaning this test can be used) for the duration of the COVID-19 declaration under Section 564(b)(1) of the Act, 21 U.S.C. section 360bbb-3(b)(1), unless the authorization is terminated or revoked.  Performed at [facility], [address], [place], [place] [address]   CBC with Differential     Status: None   Collection Time: 01/16/22  4:56 PM  Result Value Ref Range   WBC 7.8 4.0 - 10.5 K/uL   RBC 4.49 3.87 - 5.11 MIL/uL   Hemoglobin 13.8 12.0 - 15.0 g/dL   HCT 93.8 18.2 - 99.3 %   MCV 88.0 80.0 - 100.0 fL   MCH 30.7 26.0 - 34.0 pg   MCHC 34.9 30.0 - 36.0 g/dL   RDW 71.6 96.7 - 89.3 %   Platelets 342 150 - 400 K/uL   nRBC 0.0 0.0 - 0.2 %   Neutrophils Relative % 52 %   Neutro Abs 4.1 1.7 - 7.7 K/uL   Lymphocytes Relative 36 %   Lymphs Abs 2.8 0.7 - 4.0 K/uL   Monocytes Relative 8 %   Monocytes Absolute 0.6 0.1 - 1.0 K/uL   Eosinophils Relative 3 %   Eosinophils Absolute 0.2 0.0 - 0.5 K/uL   Basophils Relative 1 %   Basophils Absolute 0.1 0.0 - 0.1 K/uL   Immature Granulocytes 0 %   Abs Immature Granulocytes 0.02 0.00 - 0.07 K/uL    Comment: Performed at [facility], [address], [place], [place] [address]  Comprehensive metabolic panel     Status: Abnormal   Collection Time: 01/16/22  4:56 PM  Result Value Ref Range   Sodium 138 135 - 145 mmol/L   Potassium 3.3 (L) 3.5 - 5.1 mmol/L   Chloride 103 98 - 111 mmol/L   CO2 17 (L) 22 - 32 mmol/L   Glucose, Bld 102 (H) 70 - 99 mg/dL    Comment: Glucose reference range applies only to samples taken after fasting for  at least 8 hours.   BUN 11 6 - 20 mg/dL   Creatinine, Ser 5.10 (H) 0.44 - 1.00 mg/dL  Calcium 9.2 8.9 - 10.3 mg/dL   Total Protein 7.2 6.5 - 8.1 g/dL   Albumin 4.3 3.5 - 5.0 g/dL   AST 17 15 - 41 U/L   ALT 23 0 - 44 U/L   Alkaline Phosphatase 59 38 - 126 U/L   Total Bilirubin 0.7 0.3 - 1.2 mg/dL   GFR, Estimated 51 (L) >60 mL/min    Comment: (NOTE) Calculated using the CKD-EPI Creatinine Equation (2021)    Anion gap 18 (H) 5 - 15    Comment: Performed at [facility], [address] [place], [place] [address]  Troponin I (High Sensitivity)     Status: None   Collection Time: 01/16/22  4:56 PM  Result Value Ref Range   Troponin I (High Sensitivity) 9 <18 ng/L    Comment: (NOTE) Elevated high sensitivity troponin I (hsTnI) values and significant  changes across serial measurements may suggest ACS but many other  chronic and acute conditions are known to elevate hsTnI results.  Refer to the "Links" section for chest pain algorithms and additional  guidance. Performed at [facility], [address] [place], [place] [address]   D-dimer, quantitative     Status: None   Collection Time: 01/16/22  4:56 PM  Result Value Ref Range   D-Dimer, Quant <0.27 0.00 - 0.50 ug/mL-FEU    Comment: (NOTE) At the manufacturer cut-off value of 0.5 g/mL FEU, this assay has a negative predictive value of 95-100%.This assay is intended for use in conjunction with a clinical pretest probability (PTP) assessment model to exclude pulmonary embolism (PE) and deep venous thrombosis (DVT) in outpatients suspected of PE or DVT. Results should be correlated with clinical presentation. Performed at [facility], [address] [place], [place] [address]   SARS Coronavirus 2 by RT PCR (hospital order, performed in [facility] hospital lab) *cepheid single result test* Anterior Nasal Swab     Status: None   Collection Time: 01/16/22  4:58 PM   Specimen: Anterior Nasal Swab  Result  Value Ref Range   SARS Coronavirus 2 by RT PCR NEGATIVE NEGATIVE    Comment: (NOTE) SARS-CoV-2 target nucleic acids are NOT DETECTED.  The SARS-CoV-2 RNA is generally detectable in upper and lower respiratory specimens during the acute phase of infection. The lowest concentration of SARS-CoV-2 viral copies this assay can detect is 250 copies / mL. A negative result does not preclude SARS-CoV-2 infection and should not be used as the sole basis for treatment or other patient management decisions.  A negative result may occur with improper specimen collection / handling, submission of specimen other than nasopharyngeal swab, presence of viral mutation(s) within the areas targeted by this assay, and inadequate number of viral copies (<250 copies / mL). A negative result must be combined with clinical observations, patient history, and epidemiological information.  Fact Sheet for Patients:   RoadLapTop.co.za  Fact Sheet for Healthcare Providers: http://kim-miller.com/  This test is not yet approved or  cleared by the [name] FDA and has been authorized for detection and/or diagnosis of SARS-CoV-2 by FDA under an Emergency Use Authorization (EUA).  This EUA will remain in effect (meaning this test can be used) for the duration of the COVID-19 declaration under Section 564(b)(1) of the Act, 21 U.S.C. section 360bbb-3(b)(1), unless the authorization is terminated or revoked sooner.  Performed at [facility], [address], [place], [place] [address]   Troponin I (High Sensitivity)  Status: None   Collection Time: 01/16/22  8:35 PM  Result Value Ref Range   Troponin I (High Sensitivity) 8 <18 ng/L    Comment: (NOTE) Elevated high sensitivity troponin I (hsTnI) values and significant  changes across serial measurements may suggest ACS but many other  chronic and acute conditions are known to elevate hsTnI results.  Refer to  the "Links" section for chest pain algorithms and additional  guidance. Performed at [facility], [address], [place], [place] [address]      Assessment and Plan :   PDMP not reviewed this encounter.  1. Acute viral syndrome   2. Streptococcal sore throat   3. Smoker   4. Marijuana use     Deferred imaging given clear cardiopulmonary exam, hemodynamically stable vital signs. Will manage for viral illness such as viral URI, viral syndrome, viral rhinitis, COVID-19, viral pharyngitis. Recommended supportive care. Offered scripts for symptomatic relief. COVID 19 and strep culture are pending. Counseled patient on potential for adverse effects with medications prescribed/recommended today, ER and return-to-clinic precautions discussed, patient verbalized understanding.     Wallis Bamberg, PA-C 01/29/22 0957   At discharge, patient was evaluated for folliculitis under the chin that she reports some itching and irritation from the hairs in the area.  Has been picking a lot as well.  Does reveal that she has folliculitis on the underside of her chin.  Recommended Bactroban.   Wallis Bamberg, PA-C 01/29/22 1002

## 2022-01-29 NOTE — Telephone Encounter (Signed)
Reason for Disposition  [1] MODERATE neck pain (e.g., interferes with normal activities) AND [2] present > 3 days  Answer Assessment - Initial Assessment Questions 1. ONSET: "When did the pain begin?"      Having pain in neck under my chin on my throat it's on both sides.      Went to urgent care this morning.     They don't know what it is.   It could be Strep, Covid or a cold.  They did tests and are supposed to call me back with the results.   He gave me ointment  for the itching and amoxicillin, Levaquin.  And a cough medicine. I went a week ago to the ED they thought it was my heart .  I had a stone in my mouth or neck area.      I must eat sour candy.    I have hair on my face and neck.   I tried to pop a bump on my face and now it's sore and getting bigger.   She was seen for that too at the urgent care so needing a f/u appt.    I have a sore throat.   It hurts to swallow.    She has an appt. Scheduled with Dr. Joya Gaskins for Wed. At 11:10 01/31/2022. 2. LOCATION: "Where does it hurt?"      Under chin on both sides and my throat is sore.   It hurts to swallow. 3. PATTERN "Does the pain come and go, or has it been constant since it started?"      Constant 4. SEVERITY: "How bad is the pain?"  (Scale 1-10; or mild, moderate, severe)   - NO PAIN (0): no pain or only slight stiffness    - MILD (1-3): doesn't interfere with normal activities    - MODERATE (4-7): interferes with normal activities or awakens from sleep    - SEVERE (8-10):  excruciating pain, unable to do any normal activities      Not asked since evaluated at urgent care this morning 5. RADIATION: "Does the pain go anywhere else, shoot into your arms?"     No 6. CORD SYMPTOMS: "Any weakness or numbness of the arms or legs?"     N/A 7. CAUSE: "What do you think is causing the neck pain?"     I've had a stone before and been told to eat hard sour candy.   The stone was in my neck/mouth area. 8. NECK OVERUSE: "Any recent activities  that involved turning or twisting the neck?"     No 9. OTHER SYMPTOMS: "Do you have any other symptoms?" (e.g., headache, fever, chest pain, difficulty breathing, neck swelling)     See above 10. PREGNANCY: "Is there any chance you are pregnant?" "When was your last menstrual period?"       Not asked  Protocols used: Neck Pain or Stiffness-A-AH

## 2022-01-29 NOTE — Telephone Encounter (Signed)
  Chief Complaint: Needed f/u appt. For urgent care visit this morning Symptoms: sore throat, swelling under chin on both sides, tired. Frequency: Now Pertinent Negatives: Patient denies knowing what it is.   They did tests in urgent care.  To call me with results. Disposition: [] ED /[] Urgent Care (no appt availability in office) / [x] Appointment(In office/virtual)/ []  [facility]/ [] Home Care/ [] Refused Recommended Disposition /[]  Mobile Bus/ []  Follow-up with PCP Additional Notes: Appt was made with Dr. Asencion Noble for 01/31/2022 at 11:10 for f/u.

## 2022-01-29 NOTE — Discharge Instructions (Addendum)
We will notify you of your test results as they arrive and may take between about 24 hours.  I encourage you to sign up for MyChart if you have not already done so as this can be the easiest way for [place] to communicate results to you online or through a phone app.  Generally, we only contact you if it is a positive test result.  In the meantime, if you develop worsening symptoms including fever, chest pain, shortness of breath despite our current treatment plan then please report to the emergency room as this may be a sign of worsening status from possible viral infection.  Otherwise, we will manage this as a viral syndrome. For sore throat or cough try using a honey-based tea. Use 3 teaspoons of honey with juice squeezed from half lemon. Place shaved pieces of ginger into 1/2-1 cup of water and warm over stove top. Then mix the ingredients and repeat every 4 hours as needed. Please take Tylenol 500mg -650mg  every 6 hours for aches and pains, fevers.  It is okay to use ibuprofen for lymph node pain and inflammation at a dose of 400mg -600mg  every 6 hours as needed.  Hydrate very well with at least 2 liters of water. Eat light meals such as soups to replenish electrolytes and soft fruits, veggies. Start an antihistamine like Zyrtec for postnasal drainage, sinus congestion.  You can take this together with pseudoephedrine (Sudafed) at a dose of 60 mg 2-3 times a day as needed for the same kind of congestion.  Use the cough medications as needed.

## 2022-01-30 ENCOUNTER — Other Ambulatory Visit: Payer: Self-pay | Admitting: Family Medicine

## 2022-01-30 LAB — SARS CORONAVIRUS 2 (TAT 6-24 HRS): SARS Coronavirus 2: NEGATIVE

## 2022-01-31 ENCOUNTER — Ambulatory Visit: Payer: Medicaid Other | Attending: Critical Care Medicine | Admitting: Critical Care Medicine

## 2022-01-31 ENCOUNTER — Encounter: Payer: Self-pay | Admitting: Critical Care Medicine

## 2022-01-31 VITALS — BP 147/94 | HR 80 | Temp 98.4°F | Ht 69.0 in | Wt 309.4 lb

## 2022-01-31 DIAGNOSIS — G43909 Migraine, unspecified, not intractable, without status migrainosus: Secondary | ICD-10-CM | POA: Insufficient documentation

## 2022-01-31 DIAGNOSIS — J0140 Acute pansinusitis, unspecified: Secondary | ICD-10-CM | POA: Diagnosis not present

## 2022-01-31 DIAGNOSIS — F419 Anxiety disorder, unspecified: Secondary | ICD-10-CM | POA: Diagnosis not present

## 2022-01-31 DIAGNOSIS — G43809 Other migraine, not intractable, without status migrainosus: Secondary | ICD-10-CM

## 2022-01-31 DIAGNOSIS — J019 Acute sinusitis, unspecified: Secondary | ICD-10-CM | POA: Insufficient documentation

## 2022-01-31 DIAGNOSIS — F32A Depression, unspecified: Secondary | ICD-10-CM

## 2022-01-31 DIAGNOSIS — K2100 Gastro-esophageal reflux disease with esophagitis, without bleeding: Secondary | ICD-10-CM

## 2022-01-31 DIAGNOSIS — I10 Essential (primary) hypertension: Secondary | ICD-10-CM

## 2022-01-31 MED ORDER — HYDROXYZINE HCL 25 MG PO TABS: ORAL_TABLET | ORAL | 0 refills | Status: DC

## 2022-01-31 MED ORDER — AMITRIPTYLINE HCL 25 MG PO TABS: 25.0000 mg | ORAL_TABLET | Freq: Every day | ORAL | 2 refills | Status: DC

## 2022-01-31 MED ORDER — PANTOPRAZOLE SODIUM 40 MG PO TBEC: 40.0000 mg | DELAYED_RELEASE_TABLET | Freq: Every day | ORAL | 3 refills | Status: DC

## 2022-01-31 MED ORDER — CEFDINIR 300 MG PO CAPS: 300.0000 mg | ORAL_CAPSULE | Freq: Two times a day (BID) | ORAL | 0 refills | Status: AC

## 2022-01-31 MED ORDER — AMLODIPINE BESYLATE 10 MG PO TABS: ORAL_TABLET | ORAL | 1 refills | Status: DC

## 2022-01-31 MED ORDER — VALSARTAN-HYDROCHLOROTHIAZIDE 160-25 MG PO TABS: 1.0000 | ORAL_TABLET | Freq: Every day | ORAL | 3 refills | Status: AC

## 2022-01-31 MED ORDER — FLUTICASONE PROPIONATE 50 MCG/ACT NA SUSP: 2.0000 | Freq: Every day | NASAL | 6 refills | Status: DC

## 2022-01-31 MED ORDER — BUSPIRONE HCL 15 MG PO TABS: 15.0000 mg | ORAL_TABLET | Freq: Two times a day (BID) | ORAL | 2 refills | Status: DC

## 2022-01-31 MED ORDER — SERTRALINE HCL 50 MG PO TABS: ORAL_TABLET | ORAL | 1 refills | Status: DC

## 2022-01-31 NOTE — Assessment & Plan Note (Signed)
Increase Zoloft 50 mg daily

## 2022-01-31 NOTE — Progress Notes (Signed)
Established Patient Office Visit  Subjective   Patient ID: Ann Moran, female    DOB: 1986-03-07  Age: 36 y.o. MRN: [id number]  Chief Complaint  Patient presents with   NECK SWELLING    36 y.o.F PCP [place] Patient arrives today complaining of submental pain for 1 week increased heartburn off proton pump inhibitor difficulty swallowing increased yellow mucus coming from the sinuses and sinus pressure and pain.  Below is copy of the last visit in September 2023   Last seen 12/2021 Ann Moran is a 36 y.o. year old female with a history of hypertension, PCOS seen today for follow-up visit.    Interval History:   She is still having insomnia despite taking trazodone and finds that she also has to take Tylenol PM in addition.  She is also on hydroxyzine and BuSpar for anxiety. Doing well on Zoloft for her depression.   Complains of migraines in her occiput x1 year with associated photophobia occurring about 3x/week.  She has no nausea or vomiting. Her BP is elevated today but was normal at her last visit.  She attributes elevation to being stressed today.   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 Essential hypertension Slightly above goal Blood pressure was normal at last visit hence I will make no regimen changes Continue current regimen   2. Insomnia, unspecified type Uncontrolled Currently on trazodone I have added Elavil for migraine prophylaxis and hopefully this will help with insomnia If insomnia persist consider increasing trazodone dose   3. Other migraine without status migrainosus, not intractable Counseled on options for prophylaxis and we have discussed possible side effects She has opted to go with Elavil - amitriptyline (ELAVIL) 10 MG tablet; Take 1 tablet (10 mg total) by mouth at bedtime. For migraines  Dispense: 30 tablet; Refill: 1   4. Pain of right forearm Uncontrolled on diclofenac Last x-ray of the right elbow  was unremarkable in 10/2020 - meloxicam (MOBIC) 7.5 MG tablet; Take 1 tablet (7.5 mg total) by mouth daily.  Dispense: 30 tablet; Refill: 1   Recently the patient's been in the emergency room for several times because of chest pain finally seen by cardiology for chest pain found to be atypical and noncardiac see below  Hast been to ED three times since for chest pain then saw Cardiology: Ann Moran is a 36 y.o. female with a hx of hypertension, prediabetes, anxiety/depression, obesity and tobacco use.  Patient was initially referred due to a syncope and family history of cerebral aneurysm.  Patient was initially seen in July 2021, at the time, she reported she had 4 episodes of cough induced syncope.  Coughing is usually induced by smoking.  She reported racing heartbeat when she is upset.  Zio patch showed no significant arrhythmia in 2021.  Patient was last seen by Ann Moran on 07/22/2020 at which time the time Ann Moran recommended sleep study, echocardiogram and MRA of head.  Echocardiogram obtained on 08/26/2020 showed EF 65 to 70%, no regional wall motion abnormality, mild LVH, grade 1 DD, normal RVSP, no significant valve issue.  It does not appears that MRI was ever done.  More recently, patient was seen in the ED on 01/16/2022 for atypical chest pain.  The onset of the chest discomfort was 4 days prior to the ED visit and since that time, the chest pain was essentially constant.  Creatinine 1.37 which is higher than normal.  Potassium 3.3.  D-dimer negative.  CBC showed normal red blood cell count.  Despite prolonged chest pain, serial troponin negative x2.  COVID test negative.  Chest x-ray normal.   Patient presents today for post ED follow-up.  Her chest pain started last Friday and went away by Tuesday while in the emergency room.  Her chest is still sore at this time.  It is tender to touch.  She says when her chest pain first started, it was worse with deep inspiration, body rotation or  palpation.  It sounds more like musculoskeletal in etiology.  I do not recommend any further work-up for the atypical chest pain.  She does continue to snore and has daytime somnolence, I recommend proceed with split-night sleep study.  Otherwise, she can follow-up with Dr. Gardiner Rhyme in 1 year.  She likely has obstructive sleep apnea and that once her sleep study come   1. Atypical chest pain  2. Snoring  3. Essential hypertension    PLAN:    In order of problems listed above:   1. Atypical chest pain: Despite prolonged chest pain, recent troponin in the emergency room was negative.  Her chest pain was worse with deep inspiration, body rotation or palpation.  I suspect her chest pain was musculoskeletal in etiology   2. Snoring: With daytime somnolence.  Proceed with split-night study.  Patient had a previous sleep study in 2021 however never undergone CPAP therapy   3. Hypertension: Blood pressure stable.          Review of Systems  Constitutional:  Negative for chills, diaphoresis, fever, malaise/fatigue and weight loss.  HENT:  Positive for sinus pain and sore throat. Negative for congestion, hearing loss, nosebleeds and tinnitus.        Submental swelling  Eyes:  Negative for blurred vision, photophobia and redness.  Respiratory:  Negative for cough, hemoptysis, sputum production, shortness of breath, wheezing and stridor.   Cardiovascular:  Negative for chest pain, palpitations, orthopnea, claudication, leg swelling and PND.  Gastrointestinal:  Negative for abdominal pain, blood in stool, constipation, diarrhea, heartburn, nausea and vomiting.  Genitourinary:  Negative for dysuria, flank pain, frequency, hematuria and urgency.  Musculoskeletal:  Negative for back pain, falls, joint pain, myalgias and neck pain.  Skin:  Negative for itching and rash.  Neurological:  Negative for dizziness, tingling, tremors, sensory change, speech change, focal weakness, seizures, loss of  consciousness, weakness and headaches.  Endo/Heme/Allergies:  Negative for environmental allergies and polydipsia. Does not bruise/bleed easily.  Psychiatric/Behavioral:  Negative for depression, memory loss, substance abuse and suicidal ideas. The patient is not nervous/anxious and does not have insomnia.       Objective:     BP (!) 147/94   Pulse 80   Temp 98.4 F (36.9 C) (Oral)   Ht '5\' 9"'  (1.753 m)   Wt (!) 309 lb 6.4 oz (140.3 kg)   SpO2 98%   BMI 45.69 kg/m    Physical Exam Vitals reviewed.  Constitutional:      Appearance: Normal appearance. She is well-developed. She is not diaphoretic.  HENT:     Head: Normocephalic and atraumatic.     Right Ear: Tympanic membrane, ear canal and external ear normal. There is no impacted cerumen.     Left Ear: Tympanic membrane, ear canal and external ear normal. There is no impacted cerumen.     Nose: Congestion and rhinorrhea present. No nasal deformity, septal deviation or mucosal edema.     Right Sinus:  No maxillary sinus tenderness or frontal sinus tenderness.     Left Sinus: No maxillary sinus tenderness or frontal sinus tenderness.     Mouth/Throat:     Mouth: Mucous membranes are moist.     Pharynx: Oropharyngeal exudate present.     Comments: Submental swelling and tenderness erythema below the tongue posterior erythema Eyes:     General: No scleral icterus.    Conjunctiva/sclera: Conjunctivae normal.     Pupils: Pupils are equal, round, and reactive to light.  Neck:     Thyroid: No thyromegaly.     Vascular: No carotid bruit or JVD.     Trachea: Trachea normal. No tracheal tenderness or tracheal deviation.  Cardiovascular:     Rate and Rhythm: Normal rate and regular rhythm.     Chest Wall: PMI is not displaced.     Pulses: Normal pulses. No decreased pulses.     Heart sounds: Normal heart sounds, S1 normal and S2 normal. Heart sounds not distant. No murmur heard.    No systolic murmur is present.     No diastolic  murmur is present.     No friction rub. No gallop. No S3 or S4 sounds.  Pulmonary:     Effort: No tachypnea, accessory muscle usage or respiratory distress.     Breath sounds: No stridor. No decreased breath sounds, wheezing, rhonchi or rales.  Chest:     Chest wall: No tenderness.  Abdominal:     General: Bowel sounds are normal. There is no distension.     Palpations: Abdomen is soft. Abdomen is not rigid.     Tenderness: There is no abdominal tenderness. There is no guarding or rebound.  Musculoskeletal:        General: Normal range of motion.     Cervical back: Normal range of motion and neck supple. No edema, erythema or rigidity. No muscular tenderness. Normal range of motion.  Lymphadenopathy:     Head:     Right side of head: No submental or submandibular adenopathy.     Left side of head: No submental or submandibular adenopathy.     Cervical: No cervical adenopathy.  Skin:    General: Skin is warm and dry.     Coloration: Skin is not pale.     Findings: No rash.     Nails: There is no clubbing.  Neurological:     Mental Status: She is alert and oriented to person, place, and time.     Sensory: No sensory deficit.  Psychiatric:        Speech: Speech normal.        Behavior: Behavior normal.      No results found for any visits on 01/31/22.    The ASCVD Risk score (Arnett DK, et al., 2019) failed to calculate for the following reasons:   The 2019 ASCVD risk score is only valid for ages 32 to 22    Assessment & Plan:   Problem List Items Addressed This Visit       Cardiovascular and Mediastinum   Essential hypertension - Primary    Not well controlled plan to add valsartan HCT and continue amlodipine      Relevant Medications   amLODipine (NORVASC) 10 MG tablet   valsartan-hydrochlorothiazide (DIOVAN-HCT) 160-25 MG tablet   Other Relevant Orders   BMP8+eGFR   Migraine    Migraine headaches partially responding to amitriptyline we will increase dose to  25 mg daily      Relevant  Medications   amitriptyline (ELAVIL) 25 MG tablet   amLODipine (NORVASC) 10 MG tablet   valsartan-hydrochlorothiazide (DIOVAN-HCT) 160-25 MG tablet   sertraline (ZOLOFT) 50 MG tablet     Respiratory   Sinusitis, acute    Acute sinusitis plan cefdinir for 10 days 300 mg twice daily we will give Flonase 2 sprays each nostril daily and saline rinse      Relevant Medications   cefdinir (OMNICEF) 300 MG capsule   fluticasone (FLONASE) 50 MCG/ACT nasal spray     Digestive   Gastroesophageal reflux disease    Significant reflux disease refill pantoprazole daily and give patient lifestyle medicine handout  The following Lifestyle Medicine recommendations according to [place] of Lifestyle Medicine [facility]) were discussed and offered to patient who agrees to start the journey:  A. Whole Foods, Plant-based plate comprising of fruits and vegetables, plant-based proteins, whole-grain carbohydrates was discussed in detail with the patient.   A list for source of those nutrients were also provided to the patient.  Patient will use only water or unsweetened tea for hydration. B.  The need to stay away from risky substances including alcohol, smoking; obtaining 7 to 9 hours of restorative sleep, at least 150 minutes of moderate intensity exercise weekly, the importance of healthy social connections,  and stress reduction techniques were discussed. C.  A full color page of  Calorie density of various food groups per pound showing examples of each food groups was provided to the patient.       Relevant Medications   pantoprazole (PROTONIX) 40 MG tablet     Other   Anxiety and depression    Increase Zoloft 50 mg daily      Relevant Medications   amitriptyline (ELAVIL) 25 MG tablet   busPIRone (BUSPAR) 15 MG tablet   hydrOXYzine (ATARAX) 25 MG tablet   sertraline (ZOLOFT) 50 MG tablet   38 minutes spent extra time needed for assessments multiple systems Return  in about 4 months (around 06/03/2022) for htn.    Asencion Noble, MD

## 2022-01-31 NOTE — Assessment & Plan Note (Signed)
Not well controlled plan to add valsartan HCT and continue amlodipine

## 2022-01-31 NOTE — Patient Instructions (Signed)
Labs today include metabolic panel  Stop losartan and begin valsartan HCT 1 daily for blood pressure and stay on amlodipine daily  Measure your blood pressure 1-2 times daily and bring the results back to our clinical pharmacy visit return to make with [facility] in the next 4 weeks  Return to see Dr. Margarita Rana 4 months  Follow a low-salt healthy diet see the lifestyle medicine handout we gave you  Take cefdinir twice a day for 10 days for acute sinus infection and also submental gland infection  Use Flonase 2 sprays each nostril daily  At the pharmacy go in the allergy and sinus section and pick up a simple saline spray Armen Hammer makes a good 1 [employer] makes a good 1 is already premixed and spray 3 times in both nostrils twice daily to rinse the sinuses out

## 2022-01-31 NOTE — Assessment & Plan Note (Signed)
Significant reflux disease refill pantoprazole daily and give patient lifestyle medicine handout  The following Lifestyle Medicine recommendations according to [facility]) were discussed and offered to patient who agrees to start the journey:  A. Whole Foods, Plant-based plate comprising of fruits and vegetables, plant-based proteins, whole-grain carbohydrates was discussed in detail with the patient.   A list for source of those nutrients were also provided to the patient.  Patient will use only water or unsweetened tea for hydration. B.  The need to stay away from risky substances including alcohol, smoking; obtaining 7 to 9 hours of restorative sleep, at least 150 minutes of moderate intensity exercise weekly, the importance of healthy social connections,  and stress reduction techniques were discussed. C.  A full color page of  Calorie density of various food groups per pound showing examples of each food groups was provided to the patient.

## 2022-01-31 NOTE — Assessment & Plan Note (Signed)
Migraine headaches partially responding to amitriptyline we will increase dose to 25 mg daily

## 2022-01-31 NOTE — Assessment & Plan Note (Signed)
Acute sinusitis plan cefdinir for 10 days 300 mg twice daily we will give Flonase 2 sprays each nostril daily and saline rinse

## 2022-02-01 LAB — BMP8+EGFR
BUN/Creatinine Ratio: 8 — ABNORMAL LOW (ref 9–23)
BUN: 7 mg/dL (ref 6–20)
CO2: 25 mmol/L (ref 20–29)
Calcium: 9.4 mg/dL (ref 8.7–10.2)
Chloride: 103 mmol/L (ref 96–106)
Creatinine, Ser: 0.87 mg/dL (ref 0.57–1.00)
Glucose: 76 mg/dL (ref 70–99)
Potassium: 4.2 mmol/L (ref 3.5–5.2)
Sodium: 141 mmol/L (ref 134–144)
eGFR: 88 mL/min/{1.73_m2} (ref >59)

## 2022-02-01 LAB — CULTURE, GROUP A STREP (THRC)

## 2022-02-01 NOTE — Progress Notes (Signed)
Let pt know kidney normal.  Potassium normal  no changes from recommendations at OV

## 2022-02-02 ENCOUNTER — Telehealth: Payer: Self-pay

## 2022-02-02 NOTE — Telephone Encounter (Signed)
-----   Message from Elsie Stain, MD sent at 02/01/2022  5:55 AM EDT ----- Let pt know kidney normal.  Potassium normal  no changes from recommendations at OV

## 2022-02-02 NOTE — Telephone Encounter (Signed)
Pt was called and vm was left, Information has been sent to nurse pool.   

## 2022-02-05 ENCOUNTER — Other Ambulatory Visit: Payer: Self-pay

## 2022-02-05 ENCOUNTER — Telehealth: Payer: Self-pay | Admitting: Internal Medicine

## 2022-02-05 NOTE — Telephone Encounter (Signed)
Received this message from [place] at [facility] regarding this patient's CPAP order   "Hi Raven.     Our team tried to fax back to you and they only had the main fax number. Can you please share your direct fax number, so they can send faxes to you?  Please see the attached document.  This patient has Medicaid and they require a Sleep Study that's within a year. The one that was sent over was 2021.     Thank you.     Tammy Gamble"

## 2022-02-05 NOTE — Telephone Encounter (Signed)
Order- Home Sleep Test   dx OSA  Please explain to her that the [place] says Medicaid is requiring the updated study.  She should call for results 2 weeks after the test is done.

## 2022-02-13 ENCOUNTER — Encounter: Payer: Self-pay | Admitting: Internal Medicine

## 2022-02-13 ENCOUNTER — Ambulatory Visit: Payer: Medicaid Other | Attending: Internal Medicine | Admitting: Internal Medicine

## 2022-02-13 VITALS — BP 120/81 | HR 102 | Ht 68.5 in | Wt 305.6 lb

## 2022-02-13 DIAGNOSIS — Z5321 Procedure and treatment not carried out due to patient leaving prior to being seen by health care provider: Secondary | ICD-10-CM

## 2022-02-13 NOTE — Progress Notes (Signed)
As I was about to enter the room, patient was walking down the hall and told my CMA that she would like to reschedule the appointment.

## 2022-02-27 ENCOUNTER — Ambulatory Visit: Payer: Self-pay | Admitting: *Deleted

## 2022-02-27 NOTE — Telephone Encounter (Signed)
Chief Complaint: worsening anxiety, headaches, bilateral feet and ankle swelling  Symptoms: see above, trouble sleeping only sleeps 3-4 hours a night with medication. Left foot and ankle swelling pain with walking. Headache from anxiety per patient. Evicted from home and is staying with a friend but going through anxiety with episodes of chest tightness, feeling overwhelmed, toxic relationship with patient's mother. Requesting medication for short term anxiety relief. Denies hurting self of others. Reported she does not have all of her medications where she had to move out suddenly. Reports she has been taking BP meds. Patient unable to verbalize which medications she does not have.  Frequency: since Friday 02/23/22 but reports anxiety has been noted prior to Friday  Pertinent Negatives: Patient denies chest pain only tightness at times , sweating at times. No difficulty breathing. No neuro deficits.  Disposition: [] ED /[x] Urgent Care (no appt availability in office) / [] Appointment(In office/virtual)/ []  [facility]/ [] Home Care/ [] Refused Recommended Disposition /[x] [facility]/ []  Follow-up with PCP Additional Notes:   Offered urgent crisis center # and patient declined. Recommended to go to ED if chest tightness worsens. Recommended to go to UC / Mobile bus today for evaluation. Please advise .       Reason for Disposition  MODERATE anxiety (e.g., persistent or frequent anxiety symptoms; interferes with sleep, school, or work)  Answer Assessment - Initial Assessment Questions 1. CONCERN: "Did anything happen that prompted you to call today?"      Anxiety worsening. Was evicted from housing and had to move in with a friend in the past week. Toxic relationship with mother  2. ANXIETY SYMPTOMS: "Can you describe how you (your loved one; patient) have been feeling?" (e.g., tense, restless, panicky, anxious, keyed up, overwhelmed, sense of impending doom).      Overwhelmed,  anxious restless, tense 3. ONSET: "How long have you been feeling this way?" (e.g., hours, days, weeks)     Since Friday and 2 weeks noticed to start anxiety  4. SEVERITY: "How would you rate the level of anxiety?" (e.g., 0 - 10; or mild, moderate, severe).     Worsening  5. FUNCTIONAL IMPAIRMENT: "How have these feelings affected your ability to do daily activities?" "Have you had more difficulty than usual doing your normal daily activities?" (e.g., getting better, same, worse; self-care, school, work, interactions)     Was just "kicked out" of home on Friday  6. HISTORY: "Have you felt this way before?" "Have you ever been diagnosed with an anxiety problem in the past?" (e.g., generalized anxiety disorder, panic attacks, PTSD). If Yes, ask: "How was this problem treated?" (e.g., medicines, counseling, etc.)     Yes , taking medication . Requesting medication for short acting anxiety  7. RISK OF HARM - SUICIDAL IDEATION: "Do you ever have thoughts of hurting or killing yourself?" If Yes, ask:  "Do you have these feelings now?" "Do you have a plan on how you would do this?"     No  8. TREATMENT:  "What has been done so far to treat this anxiety?" (e.g., medicines, relaxation strategies). "What has helped?"     Trying to keep going  9. TREATMENT - THERAPIST: "Do you have a counselor or therapist? Name?"     no 10. POTENTIAL TRIGGERS: "Do you drink caffeinated beverages (e.g., coffee, colas, teas), and how much daily?" "Do you drink alcohol or use any drugs?" "Have you started any new medicines recently?"       na 11. PATIENT  SUPPORT: "Who is with you now?" "Who do you live with?" "Do you have family or friends who you can talk to?"        Staying with a friend  85. OTHER SYMPTOMS: "Do you have any other symptoms?" (e.g., feeling depressed, trouble concentrating, trouble sleeping, trouble breathing, palpitations or fast heartbeat, chest pain, sweating, nausea, or diarrhea)       Trouble sleeping ,  chest tightness at times , sweating at times keeps air on, sleeps 3-4 hours a night . Has headaches, bilateral feet swelling left foot and ankle more swollen than right . Pain to walk  at times.  13. PREGNANCY: "Is there any chance you are pregnant?" "When was your last menstrual period?"       na  Protocols used: Anxiety and Panic Attack-A-AH

## 2022-02-28 ENCOUNTER — Ambulatory Visit
Admission: RE | Admit: 2022-02-28 | Discharge: 2022-02-28 | Disposition: A | Discharge status: Home / Self Care | Payer: Medicaid Other | Source: Ambulatory Visit | Attending: Emergency Medicine | Admitting: Emergency Medicine

## 2022-02-28 VITALS — BP 117/84 | HR 83 | Temp 98.4°F | Resp 16

## 2022-02-28 DIAGNOSIS — I252 Old myocardial infarction: Secondary | ICD-10-CM | POA: Diagnosis not present

## 2022-02-28 DIAGNOSIS — R9431 Abnormal electrocardiogram [ECG] [EKG]: Secondary | ICD-10-CM | POA: Diagnosis not present

## 2022-02-28 DIAGNOSIS — R600 Localized edema: Secondary | ICD-10-CM | POA: Diagnosis not present

## 2022-02-28 NOTE — ED Triage Notes (Addendum)
The patient states last Friday she noticed bilateral foot swelling and she now has pain to both feet. The patient states she is taking her prescribed medications for blood pressure. The reports having periods of SOB.

## 2022-02-28 NOTE — ED Notes (Signed)
EKG given to provider 

## 2022-02-28 NOTE — Telephone Encounter (Signed)
Pt has been seen in the ED 

## 2022-02-28 NOTE — Discharge Instructions (Signed)
At this time, I do not see any signs of you having a heart attack however your EKG reveals that you have had one at some point in the past.  With your history of hypertension and your current swelling in your lower extremities, believe is very important that you follow-up with your primary care provider to discuss referral to a cardiologist.  While your blood pressure today is very good, I am concerned that you are still retaining excess fluid and require more aggressive treatment to help your body eliminate this excess fluid.  In the meantime, recommend that you keep your feet elevated at all times, eat a low-sodium diet and consider quitting smoking.

## 2022-02-28 NOTE — ED Provider Notes (Signed)
[facility]    CSN: [id number] Arrival date & time: 02/28/22  0901    HISTORY   Chief Complaint  Patient presents with   Ankle Pain    Feet swelling very painful - Entered by patient   Leg Swelling   HPI Ann Moran is a pleasant, 36 y.o. female who presents to urgent care today. The patient states last 5 days ago, she noticed bilateral foot swelling and now has pain in both feet. The patient states she is taking her prescribed medications for blood pressure, amlodipine and valsartan/HCTZ, systolic blood pressure today is excellent, diastolic is elevated.  Patient is morbidly obese.  Patient has also a current everyday smoker with O2 sats of 94% on arrival.  Patient reached out to her primary care office by phone yesterday to asking for help with her anxiety due to recently being evicted from her home and having to stay with a friend and ankle swelling.  Instead of scheduling an appointment for her, they advised her to go to urgent care.  The history is provided by the patient.   Past Medical History:  Diagnosis Date   Allergy    Anemia    Anxiety    Chlamydia    Depression    hx of meds, none currently- doing ok   Diabetes mellitus without complication (HCC)    GERD (gastroesophageal reflux disease)    Infection due to trichomonas    MRSA (methicillin resistant staph aureus) culture positive    Dec 2012   Obesity    Pregnancy induced hypertension    Urinary tract infection    Patient Active Problem List   Diagnosis Date Noted   Sinusitis, acute 01/31/2022   Migraine 01/31/2022   Anxiety and depression 01/31/2022   OSA (obstructive sleep apnea) 01/25/2022   Smoking 02/23/2021   Patellar tendinitis of both knees 01/27/2021   Pain in joint involving right ankle and foot 01/27/2021   Thoracic back pain 12/23/2020   Right elbow pain 12/23/2020   Chronic pain of right knee 12/23/2020   Ankle fracture, left 08/09/2020   Dysphagia 05/20/2019   Encounter  for other preprocedural examination 05/20/2019   PCOS (polycystic ovarian syndrome) 02/24/2019   Neck pain 02/24/2019   Morbid obesity with body mass index (BMI) of 45.0 to 49.9 in adult [facility]) 02/24/2019   Essential hypertension 02/24/2019   Gastroesophageal reflux disease 02/24/2019   Irregular menstrual cycle 01/14/2019   Abnormal uterine bleeding 01/14/2019   Chronic cough 01/14/2019   Past Surgical History:  Procedure Laterality Date   CHOLECYSTECTOMY     ECTOPIC PREGNANCY SURGERY     ESOPHAGOGASTRODUODENOSCOPY  05/2019   OB History     Gravida  2   Para  1   Term  0   Preterm  1   AB  1   Living  1      SAB      IAB      Ectopic  1   Multiple      Live Births  1          Home Medications    Prior to Admission medications   Medication Sig Start Date End Date Taking? Authorizing Provider  amitriptyline (ELAVIL) 25 MG tablet Take 1 tablet (25 mg total) by mouth at bedtime. For migraines 01/31/22   Storm Frisk, MD  amLODipine (NORVASC) 10 MG tablet TAKE 1 TABLET(10 MG) BY MOUTH DAILY 01/31/22   Storm Frisk, MD  benzonatate [facility])  100 MG capsule Take 1 capsule (100 mg total) by mouth 3 (three) times daily as needed for cough. 01/29/22   Wallis Bamberg, PA-C  Blood Pressure Monitoring (BLOOD PRESSURE CUFF) MISC Use to check blood pressure once daily. 08/29/21   Hoy Register, MD  busPIRone (BUSPAR) 15 MG tablet Take 1 tablet (15 mg total) by mouth 2 (two) times daily. 01/31/22   Storm Frisk, MD  fluticasone (FLONASE) 50 MCG/ACT nasal spray Place 2 sprays into both nostrils daily. 01/31/22   Storm Frisk, MD  hydrOXYzine (ATARAX) 25 MG tablet TAKE 1 TABLET(25 MG) BY MOUTH EVERY 8 HOURS AS NEEDED FOR ANXIETY 01/31/22   Storm Frisk, MD  levocetirizine (XYZAL) 5 MG tablet Take 1 tablet (5 mg total) by mouth every evening. 01/29/22   Wallis Bamberg, PA-C  meloxicam (MOBIC) 7.5 MG tablet Take 1 tablet (7.5 mg total) by mouth daily. 01/03/22    Hoy Register, MD  mupirocin ointment (BACTROBAN) 2 % Apply 1 Application topically 3 (three) times daily. 01/29/22   Wallis Bamberg, PA-C  pantoprazole (PROTONIX) 40 MG tablet Take 1 tablet (40 mg total) by mouth daily. 01/31/22   Storm Frisk, MD  promethazine-dextromethorphan (PROMETHAZINE-DM) 6.25-15 MG/5ML syrup Take 2.5 mLs by mouth 3 (three) times daily as needed for cough. 01/29/22   Wallis Bamberg, PA-C  sertraline (ZOLOFT) 50 MG tablet TAKE 1 TABLET(50 MG) BY MOUTH DAILY 01/31/22   Storm Frisk, MD  tiZANidine (ZANAFLEX) 4 MG tablet Take 1 tablet (4 mg total) by mouth at bedtime. 01/16/22   Wallis Bamberg, PA-C  traZODone (DESYREL) 100 MG tablet Take 1 tablet (100 mg total) by mouth at bedtime as needed for sleep. 01/15/22   Hoy Register, MD  valsartan-hydrochlorothiazide (DIOVAN-HCT) 160-25 MG tablet Take 1 tablet by mouth daily. 01/31/22   Storm Frisk, MD  medroxyPROGESTERone (PROVERA) 5 MG tablet Take 2 tablets (10 mg total) by mouth daily. Patient not taking: Reported on 07/20/2018 06/30/18 12/06/18  Geoffery Lyons, MD    Family History Family History  Problem Relation Age of Onset   Hypertension Mother    Diabetes Mother    Asthma Father    Hypertension Father    Stroke Maternal Grandmother    Diabetes Paternal Grandmother    Hypertension Paternal Grandmother    Colon cancer Paternal Grandmother    Colon cancer Paternal Aunt    Esophageal cancer Neg Hx    Stomach cancer Neg Hx    Rectal cancer Neg Hx    Social History Social History   Tobacco Use   Smoking status: Every Day    Packs/day: 0.50    Years: 9.00    Total pack years: 4.50    Types: Cigarettes    Passive exposure: Past   Smokeless tobacco: Never   Tobacco comments:    Pt currently smoking 01/25/22 PAP  Vaping Use   Vaping Use: Some days   Last attempt to quit: 08/18/2015   Devices: "rarely"  Substance Use Topics   Alcohol use: Yes   Drug use: Yes    Types: Marijuana    Comment: Smokes daily  to treat migraines   Allergies   Prednisone  Review of Systems Review of Systems Pertinent findings revealed after performing a 14 point review of systems has been noted in the history of present illness.  Physical Exam Triage Vital Signs ED Triage Vitals  Enc Vitals Group     BP 02/17/21 0827 (!) 147/82     Pulse Rate  02/17/21 0827 72     Resp 02/17/21 0827 18     Temp 02/17/21 0827 98.3 F (36.8 C)     Temp Source 02/17/21 0827 Oral     SpO2 02/17/21 0827 98 %     Weight --      Height --      Head Circumference --      Peak Flow --      Pain Score 02/17/21 0826 5     Pain Loc --      Pain Edu? --      Excl. in GC? --   No data found.  Updated Vital Signs BP 117/84 (BP Location: Left Arm)   Pulse 83   Temp 98.4 F (36.9 C) (Oral)   Resp 16   LMP  (LMP Unknown)   SpO2 94%   Physical Exam Vitals and nursing note reviewed.  Constitutional:      General: She is not in acute distress.    Appearance: Normal appearance.  HENT:     Head: Normocephalic and atraumatic.     Nose: Congestion and rhinorrhea present. Rhinorrhea is clear.     Right Turbinates: Swollen and pale.     Left Turbinates: Swollen and pale.  Eyes:     Pupils: Pupils are equal, round, and reactive to light.  Neck:     Thyroid: Thyroid mass and thyroid tenderness present.     Vascular: Normal carotid pulses. No carotid bruit or JVD.     Trachea: Trachea and phonation normal.  Cardiovascular:     Rate and Rhythm: Normal rate and regular rhythm. No extrasystoles are present.    Pulses:          Dorsalis pedis pulses are 1+ on the right side and 1+ on the left side.       Posterior tibial pulses are 1+ on the right side and 1+ on the left side.     Heart sounds: Normal heart sounds, S1 normal and S2 normal. Heart sounds not distant. No murmur heard.    No friction rub. No gallop. No S3 or S4 sounds.  Pulmonary:     Effort: Pulmonary effort is normal.     Breath sounds: Normal breath sounds.   Musculoskeletal:        General: Normal range of motion.     Cervical back: Full passive range of motion without pain, normal range of motion and neck supple. No edema. No pain with movement.     Right lower leg: 3+ Pitting Edema (To tibial tuberosity) present.     Left lower leg: 3+ Pitting Edema (To mid shin) present.  Lymphadenopathy:     Cervical: No cervical adenopathy.  Skin:    General: Skin is warm and dry.  Neurological:     General: No focal deficit present.     Mental Status: She is alert and oriented to person, place, and time. Mental status is at baseline.  Psychiatric:        Mood and Affect: Mood normal.        Behavior: Behavior normal.        Thought Content: Thought content normal.        Judgment: Judgment normal.     Visual Acuity Right Eye Distance:   Left Eye Distance:   Bilateral Distance:    Right Eye Near:   Left Eye Near:    Bilateral Near:     [facility] / Diagnostics / Procedures:  Radiology No results found.  Procedures ED EKG  Date/Time: 02/28/2022 9:39 AM  Performed by: Theadora Rama Scales, PA-C Authorized by: Theadora Rama Scales, PA-C   Previous ECG:    Previous ECG:  Compared to current   Similarity:  Changes noted   Comparison ECG info:  Strain in III, AVF, T waves upright in V1 Interpretation:    Interpretation: abnormal     Details:  Anterior infarct, age unknown Rate:    ECG rate assessment: normal   Rhythm:    Rhythm: sinus rhythm   Ectopy:    Ectopy: none   QRS:    QRS axis:  Normal   QRS intervals:  Normal   QRS conduction: normal   ST segments:    ST segments:  Depression   Details:  III, AVF without reciprocity   Depression:  III and aVF T waves:    T waves: non-specific   Q waves:    Abnormal Q-waves: present     Q waves:  V1, V2, V3 and aVL  (including critical care time) EKG  Pending results:  Labs Reviewed - No data to display  Medications Ordered in UC: Medications - No data to  display  UC Diagnoses / Final Clinical Impressions(s)   I have reviewed the triage vital signs and the nursing notes.  Pertinent labs & imaging results that were available during my care of the patient were reviewed by me and considered in my medical decision making (see chart for details).    Final diagnoses:  Bilateral lower extremity edema  Nonspecific abnormal electrocardiogram (ECG) (EKG)  History of anterior wall myocardial infarction   Patient advised of EKG findings.  Good today.  Patient advised to keep her feet elevated is much as possible for the next few days and to transition to a low-sodium diet.  Smoking cessation recommended.  Patient advised to follow-up with her PCP regarding lower extremity swelling and to request follow-up with cardiology for possible heart failure secondary to previous anterior infarct.  ED precautions advised.  ED Prescriptions   None    PDMP not reviewed this encounter.  Pending results:  Labs Reviewed - No data to display  Discharge Instructions:   Discharge Instructions      At this time, I do not see any signs of you having a heart attack however your EKG reveals that you have had one at some point in the past.  With your history of hypertension and your current swelling in your lower extremities, believe is very important that you follow-up with your primary care provider to discuss referral to a cardiologist.  While your blood pressure today is very good, I am concerned that you are still retaining excess fluid and require more aggressive treatment to help your body eliminate this excess fluid.  In the meantime, recommend that you keep your feet elevated at all times, eat a low-sodium diet and consider quitting smoking.      Disposition Upon Discharge:  Condition: stable for discharge home  Patient presented with an acute illness with associated systemic symptoms and significant discomfort requiring urgent management. In my opinion,  this is a condition that a prudent lay person (someone who possesses an average knowledge of health and medicine) may potentially expect to result in complications if not addressed urgently such as respiratory distress, impairment of bodily function or dysfunction of bodily organs.   Routine symptom specific, illness specific and/or disease specific instructions were discussed with the patient and/or caregiver at length.  As such, the patient has been evaluated and assessed, work-up was performed and treatment was provided in alignment with urgent care protocols and evidence based medicine.  Patient/parent/caregiver has been advised that the patient may require follow up for further testing and treatment if the symptoms continue in spite of treatment, as clinically indicated and appropriate.  Patient/parent/caregiver [facility] if no better; to PCP or the Emergency Department if new signs and symptoms develop, or if the current signs or symptoms continue to change or worsen for further workup, evaluation and treatment as clinically indicated and appropriate  The patient will follow up with their current PCP if and as advised. If the patient does not currently have a PCP we will assist them in obtaining one.   The patient may need specialty follow up if the symptoms continue, in spite of conservative treatment and management, for further workup, evaluation, consultation and treatment as clinically indicated and appropriate.   Patient/parent/caregiver verbalized understanding and agreement of plan as discussed.  All questions were addressed during visit.  Please see discharge instructions below for further details of plan.  This office note has been dictated using Teaching laboratory technicianDragon speech recognition software.  Unfortunately, this method of dictation can sometimes lead to typographical or grammatical errors.  I apologize for your inconvenience in advance if this occurs.  Please  do not hesitate to reach out to me if clarification is needed.      Theadora RamaMorgan, Nolyn Eilert Scales, [place]-C 02/28/22 [phone]

## 2022-03-02 ENCOUNTER — Ambulatory Visit: Payer: Medicaid Other | Admitting: Internal Medicine

## 2022-03-03 ENCOUNTER — Other Ambulatory Visit: Payer: Self-pay | Admitting: Family Medicine

## 2022-03-05 ENCOUNTER — Ambulatory Visit (HOSPITAL_BASED_OUTPATIENT_CLINIC_OR_DEPARTMENT_OTHER): Payer: Medicaid Other | Attending: Physician Assistant | Admitting: Cardiology

## 2022-03-05 NOTE — Telephone Encounter (Signed)
Unable to refill per protocol, Rx request is too soon. Last RF 01/31/22 for 60 and 1 RF. Will refuse.  Requested Prescriptions  Pending Prescriptions Disp Refills   sertraline (ZOLOFT) 50 MG tablet [Pharmacy Med Name: SERTRALINE 50MG  TABLETS] 30 tablet     Sig: TAKE 1 TABLET(50 MG) BY MOUTH DAILY     Psychiatry:  Antidepressants - SSRI - sertraline Passed - 03/03/2022  3:19 AM      Passed - AST in normal range and within 360 days    AST  Date Value Ref Range Status  01/16/2022 17 15 - 41 U/L Final         Passed - ALT in normal range and within 360 days    ALT  Date Value Ref Range Status  01/16/2022 23 0 - 44 U/L Final         Passed - Completed PHQ-2 or PHQ-9 in the last 360 days      Passed - Valid encounter within last 6 months    Recent Outpatient Visits           2 weeks ago Patient left before evaluation by physician   [facility], MD   1 month ago Essential hypertension   [facility] Marcine Matar, MD   2 months ago Essential hypertension   [facility] Storm Frisk, MD   2 months ago Essential hypertension   [facility], Lois Huxley, RPH-CPP   5 months ago Grief reaction   [facility] [place], Marzella Schlein       Future Appointments             In 4 days [place], MD [facility] [address] [facility] [place]   In 1 week [facility], MD [facility] [place] [facility]   In 1 month [facility], MD [facility]   In 3 months [facility], MD [facility]
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
## 2022-03-09 ENCOUNTER — Ambulatory Visit: Payer: Medicaid Other | Admitting: Cardiology

## 2022-03-12 ENCOUNTER — Ambulatory Visit: Payer: Medicaid Other

## 2022-03-12 ENCOUNTER — Ambulatory Visit
Admission: RE | Admit: 2022-03-12 | Discharge: 2022-03-12 | Disposition: A | Discharge status: Home / Self Care | Payer: Medicaid Other | Source: Ambulatory Visit | Attending: Urgent Care | Admitting: Urgent Care

## 2022-03-12 VITALS — BP 136/87 | HR 94 | Temp 99.0°F | Resp 18

## 2022-03-12 DIAGNOSIS — F17200 Nicotine dependence, unspecified, uncomplicated: Secondary | ICD-10-CM

## 2022-03-12 DIAGNOSIS — B349 Viral infection, unspecified: Secondary | ICD-10-CM | POA: Insufficient documentation

## 2022-03-12 DIAGNOSIS — Z1152 Encounter for screening for COVID-19: Secondary | ICD-10-CM | POA: Diagnosis not present

## 2022-03-12 DIAGNOSIS — F17210 Nicotine dependence, cigarettes, uncomplicated: Secondary | ICD-10-CM | POA: Diagnosis not present

## 2022-03-12 DIAGNOSIS — Z791 Long term (current) use of non-steroidal anti-inflammatories (NSAID): Secondary | ICD-10-CM | POA: Insufficient documentation

## 2022-03-12 DIAGNOSIS — Z79899 Other long term (current) drug therapy: Secondary | ICD-10-CM | POA: Insufficient documentation

## 2022-03-12 MED ORDER — PROMETHAZINE-DM 6.25-15 MG/5ML PO SYRP: 2.5000 mL | ORAL_SOLUTION | Freq: Three times a day (TID) | ORAL | 0 refills | Status: DC | PRN

## 2022-03-12 MED ORDER — BENZONATATE 100 MG PO CAPS: 100.0000 mg | ORAL_CAPSULE | Freq: Three times a day (TID) | ORAL | 0 refills | Status: DC | PRN

## 2022-03-12 MED ORDER — PSEUDOEPHEDRINE HCL 60 MG PO TABS: 60.0000 mg | ORAL_TABLET | Freq: Three times a day (TID) | ORAL | 0 refills | Status: DC | PRN

## 2022-03-12 NOTE — ED Provider Notes (Signed)
[facility]  Note:  This document was prepared using Systems analyst and may include unintentional dictation errors.  MRN: [id number] DOB: [birth year]  Subjective:   Ann Moran is a 36 y.o. female presenting for 4-day history of acute onset persistent sneezing, drainage, coughing, subjective fever, body pains.  No history of asthma but patient is a smoker.  Would like a COVID and flu test.  No overt chest pain, shortness of breath or wheezing.  No current facility-administered medications for this encounter.  Current Outpatient Medications:    amitriptyline (ELAVIL) 25 MG tablet, Take 1 tablet (25 mg total) by mouth at bedtime. For migraines, Disp: 40 tablet, Rfl: 2   amLODipine (NORVASC) 10 MG tablet, TAKE 1 TABLET(10 MG) BY MOUTH DAILY, Disp: 90 tablet, Rfl: 1   benzonatate (TESSALON) 100 MG capsule, Take 1 capsule (100 mg total) by mouth 3 (three) times daily as needed for cough., Disp: 30 capsule, Rfl: 0   Blood Pressure Monitoring (BLOOD PRESSURE CUFF) MISC, Use to check blood pressure once daily., Disp: 1 each, Rfl: 0   busPIRone (BUSPAR) 15 MG tablet, Take 1 tablet (15 mg total) by mouth 2 (two) times daily., Disp: 60 tablet, Rfl: 2   fluticasone (FLONASE) 50 MCG/ACT nasal spray, Place 2 sprays into both nostrils daily., Disp: 16 g, Rfl: 6   hydrOXYzine (ATARAX) 25 MG tablet, TAKE 1 TABLET(25 MG) BY MOUTH EVERY 8 HOURS AS NEEDED FOR ANXIETY, Disp: 60 tablet, Rfl: 0   levocetirizine (XYZAL) 5 MG tablet, Take 1 tablet (5 mg total) by mouth every evening., Disp: 90 tablet, Rfl: 0   meloxicam (MOBIC) 7.5 MG tablet, Take 1 tablet (7.5 mg total) by mouth daily., Disp: 30 tablet, Rfl: 1   mupirocin ointment (BACTROBAN) 2 %, Apply 1 Application topically 3 (three) times daily., Disp: 30 g, Rfl: 0   pantoprazole (PROTONIX) 40 MG tablet, Take 1 tablet (40 mg total) by mouth daily., Disp: 30 tablet, Rfl: 3   promethazine-dextromethorphan  (PROMETHAZINE-DM) 6.25-15 MG/5ML syrup, Take 2.5 mLs by mouth 3 (three) times daily as needed for cough., Disp: 100 mL, Rfl: 0   sertraline (ZOLOFT) 50 MG tablet, TAKE 1 TABLET(50 MG) BY MOUTH DAILY, Disp: 60 tablet, Rfl: 1   tiZANidine (ZANAFLEX) 4 MG tablet, Take 1 tablet (4 mg total) by mouth at bedtime., Disp: 30 tablet, Rfl: 0   traZODone (DESYREL) 100 MG tablet, Take 1 tablet (100 mg total) by mouth at bedtime as needed for sleep., Disp: 30 tablet, Rfl: 2   valsartan-hydrochlorothiazide (DIOVAN-HCT) 160-25 MG tablet, Take 1 tablet by mouth daily., Disp: 90 tablet, Rfl: 3   Allergies  Allergen Reactions   Prednisone Swelling    Had neck and shoulder swelling following taking    Past Medical History:  Diagnosis Date   Allergy    Anemia    Anxiety    Chlamydia    Depression    hx of meds, none currently- doing ok   Diabetes mellitus without complication (HCC)    GERD (gastroesophageal reflux disease)    Infection due to trichomonas    MRSA (methicillin resistant staph aureus) culture positive    Dec 2012   Obesity    Pregnancy induced hypertension    Urinary tract infection      Past Surgical History:  Procedure Laterality Date   CHOLECYSTECTOMY     ECTOPIC PREGNANCY SURGERY     ESOPHAGOGASTRODUODENOSCOPY  05/2019    Family History  Problem Relation Age  of Onset   Hypertension Mother    Diabetes Mother    Asthma Father    Hypertension Father    Stroke Maternal Grandmother    Diabetes Paternal Grandmother    Hypertension Paternal Grandmother    Colon cancer Paternal Grandmother    Colon cancer Paternal Aunt    Esophageal cancer Neg Hx    Stomach cancer Neg Hx    Rectal cancer Neg Hx     Social History   Tobacco Use   Smoking status: Every Day    Packs/day: 0.50    Years: 9.00    Total pack years: 4.50    Types: Cigarettes    Passive exposure: Past   Smokeless tobacco: Never   Tobacco comments:    Pt currently smoking 01/25/22 PAP  Vaping Use    Vaping Use: Former   Quit date: 08/18/2015  Substance Use Topics   Alcohol use: Not Currently   Drug use: Yes    Types: Marijuana    Comment: Smokes daily to treat migraines    ROS   Objective:   Vitals: BP 136/87 (BP Location: Left Arm)   Pulse 94   Temp 99 F (37.2 C) (Oral)   Resp 18   LMP  (LMP Unknown) Comment: hx PCOS  SpO2 95%   Physical Exam Constitutional:      General: She is not in acute distress.    Appearance: Normal appearance. She is well-developed and normal weight. She is not ill-appearing, toxic-appearing or diaphoretic.  HENT:     Head: Normocephalic and atraumatic.     Right Ear: Tympanic membrane, ear canal and external ear normal. No drainage or tenderness. No middle ear effusion. There is no impacted cerumen. Tympanic membrane is not erythematous or bulging.     Left Ear: Tympanic membrane, ear canal and external ear normal. No drainage or tenderness.  No middle ear effusion. There is no impacted cerumen. Tympanic membrane is not erythematous or bulging.     Nose: Congestion and rhinorrhea present.     Mouth/Throat:     Mouth: Mucous membranes are moist. No oral lesions.     Pharynx: No pharyngeal swelling, oropharyngeal exudate, posterior oropharyngeal erythema or uvula swelling.     Tonsils: No tonsillar exudate or tonsillar abscesses.  Eyes:     General: No scleral icterus.       Right eye: No discharge.        Left eye: No discharge.     Extraocular Movements: Extraocular movements intact.     Right eye: Normal extraocular motion.     Left eye: Normal extraocular motion.     Conjunctiva/sclera: Conjunctivae normal.  Cardiovascular:     Rate and Rhythm: Normal rate and regular rhythm.     Heart sounds: Normal heart sounds. No murmur heard.    No friction rub. No gallop.  Pulmonary:     Effort: Pulmonary effort is normal. No respiratory distress.     Breath sounds: No stridor. No wheezing, rhonchi or rales.  Chest:     Chest wall: No  tenderness.  Musculoskeletal:     Cervical back: Normal range of motion and neck supple.  Lymphadenopathy:     Cervical: No cervical adenopathy.  Skin:    General: Skin is warm and dry.  Neurological:     General: No focal deficit present.     Mental Status: She is alert and oriented to person, place, and time.  Psychiatric:        Mood  and Affect: Mood normal.        Behavior: Behavior normal.    Recent Results (from the past 2160 hour(s))  POCT urine pregnancy     Status: None   Collection Time: 01/09/22  9:18 AM  Result Value Ref Range   Preg Test, Ur Negative Negative  POCT urinalysis dipstick (new)     Status: Abnormal   Collection Time: 01/09/22  9:19 AM  Result Value Ref Range   Color, UA yellow yellow   Clarity, UA clear clear   Glucose, UA negative negative mg/dL   Bilirubin, UA negative negative   Ketones, POC UA negative negative mg/dL   Spec Grav, UA 1.010 1.010 - 1.025   Blood, UA trace-lysed (A) negative   pH, UA 6.5 5.0 - 8.0   Protein Ur, POC negative negative mg/dL   Urobilinogen, UA 0.2 0.2 or 1.0 E.U./dL   Nitrite, UA Negative Negative   Leukocytes, UA Trace (A) Negative  Cervicovaginal ancillary only     Status: None   Collection Time: 01/09/22  9:20 AM  Result Value Ref Range   Neisseria Gonorrhea Negative    Chlamydia Negative    Trichomonas Negative    Bacterial Vaginitis (gardnerella) Negative    Candida Vaginitis Negative    Candida Glabrata Negative    Comment      Normal Reference Range Bacterial Vaginosis - Negative   Comment Normal Reference Range Candida Species - Negative    Comment Normal Reference Range Candida Galbrata - Negative    Comment Normal Reference Range Trichomonas - Negative    Comment Normal Reference Ranger Chlamydia - Negative    Comment      Normal Reference Range Neisseria Gonorrhea - Negative  Resp Panel by RT-PCR (Flu A&B, Covid) Anterior Nasal Swab     Status: None   Collection Time: 01/16/22  2:38 PM    Specimen: Anterior Nasal Swab  Result Value Ref Range   SARS Coronavirus 2 by RT PCR NEGATIVE NEGATIVE    Comment: (NOTE) SARS-CoV-2 target nucleic acids are NOT DETECTED.  The SARS-CoV-2 RNA is generally detectable in upper respiratory specimens during the acute phase of infection. The lowest concentration of SARS-CoV-2 viral copies this assay can detect is 138 copies/mL. A negative result does not preclude SARS-Cov-2 infection and should not be used as the sole basis for treatment or other patient management decisions. A negative result may occur with  improper specimen collection/handling, submission of specimen other than nasopharyngeal swab, presence of viral mutation(s) within the areas targeted by this assay, and inadequate number of viral copies(<138 copies/mL). A negative result must be combined with clinical observations, patient history, and epidemiological information. The expected result is Negative.  Fact Sheet for Patients:  EntrepreneurPulse.com.au  Fact Sheet for Healthcare Providers:  IncredibleEmployment.be  This test is no t yet approved or cleared by the [place] FDA and  has been authorized for detection and/or diagnosis of SARS-CoV-2 by FDA under an Emergency Use Authorization (EUA). This EUA will remain  in effect (meaning this test can be used) for the duration of the COVID-19 declaration under Section 564(b)(1) of the Act, 21 U.S.C.section 360bbb-3(b)(1), unless the authorization is terminated  or revoked sooner.       Influenza A by PCR NEGATIVE NEGATIVE   Influenza B by PCR NEGATIVE NEGATIVE    Comment: (NOTE) The Xpert Xpress SARS-CoV-2/FLU/RSV plus assay is intended as an aid in the diagnosis of influenza from Nasopharyngeal swab specimens and should not be used  as a sole basis for treatment. Nasal washings and aspirates are unacceptable for Xpert Xpress SARS-CoV-2/FLU/RSV testing.  Fact Sheet for  Patients: EntrepreneurPulse.com.au  Fact Sheet for Healthcare Providers: IncredibleEmployment.be  This test is not yet approved or cleared by the [place] FDA and has been authorized for detection and/or diagnosis of SARS-CoV-2 by FDA under an Emergency Use Authorization (EUA). This EUA will remain in effect (meaning this test can be used) for the duration of the COVID-19 declaration under Section 564(b)(1) of the Act, 21 U.S.C. section 360bbb-3(b)(1), unless the authorization is terminated or revoked.  Performed at [facility], [place] [address] [place], [place] [address]   CBC with Differential     Status: None   Collection Time: 01/16/22  4:56 PM  Result Value Ref Range   WBC 7.8 4.0 - 10.5 K/uL   RBC 4.49 3.87 - 5.11 MIL/uL   Hemoglobin 13.8 12.0 - 15.0 g/dL   HCT 39.5 36.0 - 46.0 %   MCV 88.0 80.0 - 100.0 fL   MCH 30.7 26.0 - 34.0 pg   MCHC 34.9 30.0 - 36.0 g/dL   RDW 12.9 11.5 - 15.5 %   Platelets 342 150 - 400 K/uL   nRBC 0.0 0.0 - 0.2 %   Neutrophils Relative % 52 %   Neutro Abs 4.1 1.7 - 7.7 K/uL   Lymphocytes Relative 36 %   Lymphs Abs 2.8 0.7 - 4.0 K/uL   Monocytes Relative 8 %   Monocytes Absolute 0.6 0.1 - 1.0 K/uL   Eosinophils Relative 3 %   Eosinophils Absolute 0.2 0.0 - 0.5 K/uL   Basophils Relative 1 %   Basophils Absolute 0.1 0.0 - 0.1 K/uL   Immature Granulocytes 0 %   Abs Immature Granulocytes 0.02 0.00 - 0.07 K/uL    Comment: Performed at [facility], [address], [place], [place] [address]  Comprehensive metabolic panel     Status: Abnormal   Collection Time: 01/16/22  4:56 PM  Result Value Ref Range   Sodium 138 135 - 145 mmol/L   Potassium 3.3 (L) 3.5 - 5.1 mmol/L   Chloride 103 98 - 111 mmol/L   CO2 17 (L) 22 - 32 mmol/L   Glucose, Bld 102 (H) 70 - 99 mg/dL    Comment: Glucose reference range applies only to samples taken after fasting for at least 8 hours.   BUN 11 6 - 20 mg/dL    Creatinine, Ser 1.37 (H) 0.44 - 1.00 mg/dL   Calcium 9.2 8.9 - 10.3 mg/dL   Total Protein 7.2 6.5 - 8.1 g/dL   Albumin 4.3 3.5 - 5.0 g/dL   AST 17 15 - 41 U/L   ALT 23 0 - 44 U/L   Alkaline Phosphatase 59 38 - 126 U/L   Total Bilirubin 0.7 0.3 - 1.2 mg/dL   GFR, Estimated 51 (L) >60 mL/min    Comment: (NOTE) Calculated using the CKD-EPI Creatinine Equation (2021)    Anion gap 18 (H) 5 - 15    Comment: Performed at [facility] [address], [place], [place] [address]  Troponin I (High Sensitivity)     Status: None   Collection Time: 01/16/22  4:56 PM  Result Value Ref Range   Troponin I (High Sensitivity) 9 <18 ng/L    Comment: (NOTE) Elevated high sensitivity troponin I (hsTnI) values and significant  changes across serial measurements may suggest ACS but many other  chronic and acute conditions  are known to elevate hsTnI results.  Refer to the "Links" section for chest pain algorithms and additional  guidance. Performed at [facility], [place] [address] [place], [place] [address]   D-dimer, quantitative     Status: None   Collection Time: 01/16/22  4:56 PM  Result Value Ref Range   D-Dimer, Quant <0.27 0.00 - 0.50 ug/mL-FEU    Comment: (NOTE) At the manufacturer cut-off value of 0.5 g/mL FEU, this assay has a negative predictive value of 95-100%.This assay is intended for use in conjunction with a clinical pretest probability (PTP) assessment model to exclude pulmonary embolism (PE) and deep venous thrombosis (DVT) in outpatients suspected of PE or DVT. Results should be correlated with clinical presentation. Performed at [facility], [place] [address], [place], [place] [address]   SARS Coronavirus 2 by RT PCR (hospital order, performed in [facility] hospital lab) *cepheid single result test* Anterior Nasal Swab     Status: None   Collection Time: 01/16/22  4:58 PM   Specimen: Anterior Nasal Swab  Result Value Ref Range   SARS Coronavirus 2 by RT PCR  NEGATIVE NEGATIVE    Comment: (NOTE) SARS-CoV-2 target nucleic acids are NOT DETECTED.  The SARS-CoV-2 RNA is generally detectable in upper and lower respiratory specimens during the acute phase of infection. The lowest concentration of SARS-CoV-2 viral copies this assay can detect is 250 copies / mL. A negative result does not preclude SARS-CoV-2 infection and should not be used as the sole basis for treatment or other patient management decisions.  A negative result may occur with improper specimen collection / handling, submission of specimen other than nasopharyngeal swab, presence of viral mutation(s) within the areas targeted by this assay, and inadequate number of viral copies (<250 copies / mL). A negative result must be combined with clinical observations, patient history, and epidemiological information.  Fact Sheet for Patients:   https://www.patel.info/  Fact Sheet for Healthcare Providers: https://hall.com/  This test is not yet approved or  cleared by the [place] FDA and has been authorized for detection and/or diagnosis of SARS-CoV-2 by FDA under an Emergency Use Authorization (EUA).  This EUA will remain in effect (meaning this test can be used) for the duration of the COVID-19 declaration under Section 564(b)(1) of the Act, 21 U.S.C. section 360bbb-3(b)(1), unless the authorization is terminated or revoked sooner.  Performed at [facility], [place] [address] [place], [place] [address]   Troponin I (High Sensitivity)     Status: None   Collection Time: 01/16/22  8:35 PM  Result Value Ref Range   Troponin I (High Sensitivity) 8 <18 ng/L    Comment: (NOTE) Elevated high sensitivity troponin I (hsTnI) values and significant  changes across serial measurements may suggest ACS but many other  chronic and acute conditions are known to elevate hsTnI results.  Refer to the "Links" section for chest pain algorithms  and additional  guidance. Performed at [facility], [place] [address] [place], [place] [address]   SARS CORONAVIRUS 2 (TAT 6-24 HRS) Anterior Nasal Swab     Status: None   Collection Time: 01/29/22  9:45 AM   Specimen: Anterior Nasal Swab  Result Value Ref Range   SARS Coronavirus 2 NEGATIVE NEGATIVE    Comment: (NOTE) SARS-CoV-2 target nucleic acids are NOT DETECTED.  The SARS-CoV-2 RNA is generally detectable in upper and lower respiratory specimens during the acute phase of infection. Negative results do not  preclude SARS-CoV-2 infection, do not rule out co-infections with other pathogens, and should not be used as the sole basis for treatment or other patient management decisions. Negative results must be combined with clinical observations, patient history, and epidemiological information. The expected result is Negative.  Fact Sheet for Patients: SugarRoll.be  Fact Sheet for Healthcare Providers: https://www.woods-mathews.com/  This test is not yet approved or cleared by the [place] FDA and  has been authorized for detection and/or diagnosis of SARS-CoV-2 by FDA under an Emergency Use Authorization (EUA). This EUA will remain  in effect (meaning this test can be used) for the duration of the COVID-19 declaration under Se ction 564(b)(1) of the Act, 21 U.S.C. section 360bbb-3(b)(1), unless the authorization is terminated or revoked sooner.  Performed at [facility], [place] [address], [place], [place] [address]   POCT rapid strep A     Status: None   Collection Time: 01/29/22  9:52 AM  Result Value Ref Range   Rapid Strep A Screen Negative Negative  Culture, group A strep     Status: None   Collection Time: 01/29/22  9:58 AM   Specimen: Throat  Result Value Ref Range   Specimen Description THROAT    Special Requests NONE    Culture      NO GROUP A STREP (S.PYOGENES) ISOLATED Performed at [facility], [place] [address] [place], [place] [address]    Report Status 02/01/2022 FINAL   BMP8+eGFR     Status: Abnormal   Collection Time: 01/31/22 12:15 PM  Result Value Ref Range   Glucose 76 70 - 99 mg/dL   BUN 7 6 - 20 mg/dL   Creatinine, Ser 0.87 0.57 - 1.00 mg/dL   eGFR 88 >59 mL/min/1.73   BUN/Creatinine Ratio 8 (L) 9 - 23   Sodium 141 134 - 144 mmol/L   Potassium 4.2 3.5 - 5.2 mmol/L   Chloride 103 96 - 106 mmol/L   CO2 25 20 - 29 mmol/L   Calcium 9.4 8.7 - 10.2 mg/dL     Assessment and Plan :   PDMP not reviewed this encounter.  1. Acute viral syndrome   2. Smoker     Most recent EGFR was 88 on 01/31/2022.  Patient would be a good candidate for Paxlovid should she test positive for COVID-19.   COVID and flu test pending.  We will otherwise manage for viral upper respiratory infection.  Physical exam findings reassuring and vital signs stable for discharge. Advised supportive care, offered symptomatic relief. Deferred imaging given clear cardiopulmonary exam, hemodynamically stable vital signs.  Despite her smoking will defer prednisone use for now given lack of wheezing, respiratory difficulties.  Counseled patient on potential for adverse effects with medications prescribed/recommended today, ER and return-to-clinic precautions discussed, patient verbalized understanding.      Jaynee Eagles, [place] 03/12/22 1818

## 2022-03-12 NOTE — ED Triage Notes (Signed)
Pt c/o cough, URI sx and sneezing x 4 days-NAD-steady gait

## 2022-03-12 NOTE — Discharge Instructions (Addendum)

## 2022-03-13 LAB — RESP PANEL BY RT-PCR (FLU A&B, COVID) ARPGX2
Influenza A by PCR: NEGATIVE
Influenza B by PCR: NEGATIVE
SARS Coronavirus 2 by RT PCR: NEGATIVE

## 2022-03-14 ENCOUNTER — Encounter: Payer: Self-pay | Admitting: Family Medicine

## 2022-03-14 ENCOUNTER — Ambulatory Visit: Payer: Medicaid Other | Attending: Family Medicine | Admitting: Family Medicine

## 2022-03-14 ENCOUNTER — Other Ambulatory Visit: Payer: Self-pay

## 2022-03-14 VITALS — BP 133/89 | HR 94 | Ht 69.0 in | Wt 306.0 lb

## 2022-03-14 DIAGNOSIS — E282 Polycystic ovarian syndrome: Secondary | ICD-10-CM | POA: Diagnosis not present

## 2022-03-14 DIAGNOSIS — R062 Wheezing: Secondary | ICD-10-CM | POA: Diagnosis not present

## 2022-03-14 DIAGNOSIS — J069 Acute upper respiratory infection, unspecified: Secondary | ICD-10-CM | POA: Diagnosis not present

## 2022-03-14 DIAGNOSIS — Z7951 Long term (current) use of inhaled steroids: Secondary | ICD-10-CM | POA: Diagnosis not present

## 2022-03-14 DIAGNOSIS — R9431 Abnormal electrocardiogram [ECG] [EKG]: Secondary | ICD-10-CM | POA: Diagnosis not present

## 2022-03-14 DIAGNOSIS — R0602 Shortness of breath: Secondary | ICD-10-CM | POA: Insufficient documentation

## 2022-03-14 DIAGNOSIS — R059 Cough, unspecified: Secondary | ICD-10-CM | POA: Insufficient documentation

## 2022-03-14 DIAGNOSIS — Z79899 Other long term (current) drug therapy: Secondary | ICD-10-CM | POA: Insufficient documentation

## 2022-03-14 DIAGNOSIS — Z888 Allergy status to other drugs, medicaments and biological substances status: Secondary | ICD-10-CM | POA: Diagnosis not present

## 2022-03-14 DIAGNOSIS — I10 Essential (primary) hypertension: Secondary | ICD-10-CM | POA: Diagnosis present

## 2022-03-14 MED ORDER — HYDROCOD POLI-CHLORPHE POLI ER 10-8 MG/5ML PO SUER: 5.0000 mL | Freq: Two times a day (BID) | ORAL | 0 refills | Status: DC | PRN

## 2022-03-14 MED ORDER — ALBUTEROL SULFATE HFA 108 (90 BASE) MCG/ACT IN AERS: 2.0000 | INHALATION_SPRAY | Freq: Four times a day (QID) | RESPIRATORY_TRACT | 0 refills | Status: DC | PRN

## 2022-03-14 NOTE — Patient Instructions (Signed)

## 2022-03-14 NOTE — Progress Notes (Signed)
Subjective:  Patient ID: Ann Moran, female    DOB: Aug 26, 1985  Age: 36 y.o. MRN: [id number]  CC: Hypertension   HPI Ann Moran is a 36 y.o. year old female with a history of hypertension, PCOS.  Interval History:  She has been coughing up yellow phlegm for 2 weeks and last night it was difficulty for her to catch her breath and she has had chest pain associated with cough. Currently taking Tylenol cold and flu. She has had off and on cold and chills. She previously had post nasal drip which has resolved. She has no facial pressure. She did have an ED visit 2 days ago and respiratory viral panel was negative for COVID and the flu. She has a history of sick contacts and her daughter who has had the same symptoms. 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 Past Medical History:  Diagnosis Date   Allergy    Anemia    Anxiety    Chlamydia    Depression    hx of meds, none currently- doing ok   Diabetes mellitus without complication (HCC)    GERD (gastroesophageal reflux disease)    Infection due to trichomonas    MRSA (methicillin resistant staph aureus) culture positive    Dec 2012   Obesity    Pregnancy induced hypertension    Urinary tract infection     Past Surgical History:  Procedure Laterality Date   CHOLECYSTECTOMY     ECTOPIC PREGNANCY SURGERY     ESOPHAGOGASTRODUODENOSCOPY  05/2019    Family History  Problem Relation Age of Onset   Hypertension Mother    Diabetes Mother    Asthma Father    Hypertension Father    Stroke Maternal Grandmother    Diabetes Paternal Grandmother    Hypertension Paternal Grandmother    Colon cancer Paternal Grandmother    Colon cancer Paternal Aunt    Esophageal cancer Neg Hx    Stomach cancer Neg Hx    Rectal cancer Neg Hx     Social History   Socioeconomic History   Marital status: Single    Spouse name: Not on file   Number of children: Not on file   Years of  education: Not on file   Highest education level: Not on file  Occupational History   Not on file  Tobacco Use   Smoking status: Every Day    Packs/day: 0.50    Years: 9.00    Total pack years: 4.50    Types: Cigarettes    Passive exposure: Past   Smokeless tobacco: Never   Tobacco comments:    Pt currently smoking 01/25/22 PAP  Vaping Use   Vaping Use: Former   Quit date: 08/18/2015  Substance and Sexual Activity   Alcohol use: Not Currently   Drug use: Yes    Types: Marijuana    Comment: Smokes daily to treat migraines   Sexual activity: Not Currently    Birth control/protection: None  Other Topics Concern   Not on file  Social History Narrative   Not on file   Social Determinants of Health   Financial Resource Strain: Not on file  Food Insecurity: No Food Insecurity (03/14/2020)   Hunger Vital Sign    Worried About Running Out of Food in the Last Year: Never true    Ran Out of Food in the Last Year: Never true  Transportation Needs: No Transportation Needs (03/14/2020)  PRAPARE - Administrator, Civil Service (Medical): No    Lack of Transportation (Non-Medical): No  Physical Activity: Not on file  Stress: Stress Concern Present (12/20/2021)   [employer] of Occupational Health - Occupational Stress Questionnaire    Feeling of Stress : Very much  Social Connections: Not on file    Allergies  Allergen Reactions   Prednisone Swelling    Had neck and shoulder swelling following taking    Outpatient Medications Prior to Visit  Medication Sig Dispense Refill   amitriptyline (ELAVIL) 25 MG tablet Take 1 tablet (25 mg total) by mouth at bedtime. For migraines 40 tablet 2   amLODipine (NORVASC) 10 MG tablet TAKE 1 TABLET(10 MG) BY MOUTH DAILY 90 tablet 1   benzonatate (TESSALON) 100 MG capsule Take 1 capsule (100 mg total) by mouth 3 (three) times daily as needed for cough. 30 capsule 0   Blood Pressure Monitoring (BLOOD PRESSURE CUFF) MISC Use to  check blood pressure once daily. 1 each 0   busPIRone (BUSPAR) 15 MG tablet Take 1 tablet (15 mg total) by mouth 2 (two) times daily. 60 tablet 2   fluticasone (FLONASE) 50 MCG/ACT nasal spray Place 2 sprays into both nostrils daily. 16 g 6   hydrOXYzine (ATARAX) 25 MG tablet TAKE 1 TABLET(25 MG) BY MOUTH EVERY 8 HOURS AS NEEDED FOR ANXIETY 60 tablet 0   levocetirizine (XYZAL) 5 MG tablet Take 1 tablet (5 mg total) by mouth every evening. 90 tablet 0   meloxicam (MOBIC) 7.5 MG tablet Take 1 tablet (7.5 mg total) by mouth daily. 30 tablet 1   mupirocin ointment (BACTROBAN) 2 % Apply 1 Application topically 3 (three) times daily. 30 g 0   pantoprazole (PROTONIX) 40 MG tablet Take 1 tablet (40 mg total) by mouth daily. 30 tablet 3   pseudoephedrine (SUDAFED) 60 MG tablet Take 1 tablet (60 mg total) by mouth every 8 (eight) hours as needed for congestion. 30 tablet 0   sertraline (ZOLOFT) 50 MG tablet TAKE 1 TABLET(50 MG) BY MOUTH DAILY 60 tablet 1   tiZANidine (ZANAFLEX) 4 MG tablet Take 1 tablet (4 mg total) by mouth at bedtime. 30 tablet 0   traZODone (DESYREL) 100 MG tablet Take 1 tablet (100 mg total) by mouth at bedtime as needed for sleep. 30 tablet 2   valsartan-hydrochlorothiazide (DIOVAN-HCT) 160-25 MG tablet Take 1 tablet by mouth daily. 90 tablet 3   promethazine-dextromethorphan (PROMETHAZINE-DM) 6.25-15 MG/5ML syrup Take 2.5 mLs by mouth 3 (three) times daily as needed for cough. 100 mL 0   No facility-administered medications prior to visit.     ROS Review of Systems  Constitutional:  Negative for activity change and appetite change.  HENT:  Positive for congestion. Negative for sinus pressure and sore throat.   Respiratory:  Positive for cough. Negative for chest tightness, shortness of breath and wheezing.   Cardiovascular:  Positive for chest pain. Negative for palpitations.  Gastrointestinal:  Negative for abdominal distention, abdominal pain and constipation.  Genitourinary:  Negative.   Musculoskeletal: Negative.   Psychiatric/Behavioral:  Negative for behavioral problems and dysphoric mood.     Objective:  BP 133/89   Pulse 94   Ht 5\' 9"  (1.753 m)   Wt (!) 306 lb (138.8 kg)   LMP  (LMP Unknown) Comment: hx PCOS  SpO2 97%   BMI 45.19 kg/m      03/14/2022   10:17 AM 03/12/2022    5:38 PM 02/28/2022  9:10 AM  BP/Weight  Systolic BP 133 136 117  Diastolic BP 89 87 84  Wt. (Lbs) 306    BMI 45.19 kg/m2        Physical Exam Constitutional:      Appearance: She is well-developed.  Cardiovascular:     Rate and Rhythm: Normal rate.     Heart sounds: Normal heart sounds. No murmur heard. Pulmonary:     Effort: Pulmonary effort is normal.     Breath sounds: Normal breath sounds. No wheezing or rales.  Chest:     Chest wall: No tenderness.  Abdominal:     General: Bowel sounds are normal. There is no distension.     Palpations: Abdomen is soft. There is no mass.     Tenderness: There is no abdominal tenderness.  Musculoskeletal:        General: Normal range of motion.     Right lower leg: No edema.     Left lower leg: No edema.  Neurological:     Mental Status: She is alert and oriented to person, place, and time.  Psychiatric:        Mood and Affect: Mood normal.        Latest Ref Rng & Units 01/31/2022   12:15 PM 01/16/2022    4:56 PM 11/15/2021   10:05 PM  CMP  Glucose 70 - 99 mg/dL 76  563  149   BUN 6 - 20 mg/dL 7  11  7    Creatinine 0.57 - 1.00 mg/dL  7.02  6.37   Sodium 134 - 144 mmol/L 141  138  140   Potassium 3.5 - 5.2 mmol/L 4.2  3.3  3.6   Chloride 96 - 106 mmol/L 103  103  108   CO2 20 - 29 mmol/L 25  17  26    Calcium 8.7 - 10.2 mg/dL 9.4  9.2  8.7   Total Protein 6.5 - 8.1 g/dL  7.2  6.8   Total Bilirubin 0.3 - 1.2 mg/dL  0.7  0.3   Alkaline Phos 38 - 126 U/L  59  48   AST 15 - 41 U/L  17  14   ALT 0 - 44 U/L  23  16     Lipid Panel  No results found for: "CHOL", "TRIG", "HDL", "CHOLHDL", "VLDL",  "LDLCALC", "LDLDIRECT"  CBC    Component Value Date/Time   WBC 7.8 01/16/2022 1656   RBC 4.49 01/16/2022 1656   HGB 13.8 01/16/2022 1656   HGB 13.6 04/05/2021 1351   HCT 39.5 01/16/2022 1656   HCT 39.0 04/05/2021 1351   PLT 342 01/16/2022 1656   PLT 354 04/05/2021 1351   MCV 88.0 01/16/2022 1656   MCV 86 04/05/2021 1351   MCH 30.7 01/16/2022 1656   MCHC 34.9 01/16/2022 1656   RDW 12.9 01/16/2022 1656   RDW 12.9 04/05/2021 1351   LYMPHSABS 2.8 01/16/2022 1656   LYMPHSABS 3.7 (H) 04/05/2021 1351   MONOABS 0.6 01/16/2022 1656   EOSABS 0.2 01/16/2022 1656   EOSABS 0.2 04/05/2021 1351   BASOSABS 0.1 01/16/2022 1656   BASOSABS 0.0 04/05/2021 1351    Lab Results  Component Value Date   HGBA1C 5.7 (H) 02/23/2021    Assessment & Plan:  1. Viral URI Would like to place her on short course of prednisone but she is allergic to prednisone Advised to perform nasal irrigation and discussed self-limiting course of viral illness Is also possible she might have underlying RSV  infection especially since she got this from her daughter - albuterol (VENTOLIN HFA) 108 (90 Base) MCG/ACT inhaler; Inhale 2 puffs into the lungs every 6 (six) hours as needed for wheezing or shortness of breath.  Dispense: 8 g; Refill: 0 - chlorpheniramine-HYDROcodone (TUSSIONEX) 10-8 MG/5ML; Take 5 mLs by mouth every 12 (twelve) hours as needed for cough.  Dispense: 115 mL; Refill: 0  2. Abnormal EKG EKG from today does not reveal any evidence of ischemia but possible borderline left atrial enlargement Per patient she was already scheduled to see cardiology due to EKG from urgent care. No evidence of cardiac etiology at this time   Meds ordered this encounter  Medications   albuterol (VENTOLIN HFA) 108 (90 Base) MCG/ACT inhaler    Sig: Inhale 2 puffs into the lungs every 6 (six) hours as needed for wheezing or shortness of breath.    Dispense:  8 g    Refill:  0   chlorpheniramine-HYDROcodone (TUSSIONEX)  10-8 MG/5ML    Sig: Take 5 mLs by mouth every 12 (twelve) hours as needed for cough.    Dispense:  115 mL    Refill:  0    Follow-up: Return if symptoms worsen or fail to improve.       Hoy RegisterEnobong Erykah Lippert, MD, FAAFP. [facility] [place], [place] [phone]   03/14/2022, 10:53 AM

## 2022-03-22 ENCOUNTER — Telehealth: Payer: Self-pay | Admitting: Internal Medicine

## 2022-03-22 NOTE — Telephone Encounter (Signed)
Tammy from [facility] asked if the F2F notes from 10/5 for this pt can be edited to state the necessity for new sleep study that is scheduled for 12/18 to process the order for the cpap once the sleep study results have been completed they will process the order to the patient.
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
## 2022-03-26 ENCOUNTER — Ambulatory Visit: Payer: Medicaid Other | Attending: Cardiology | Admitting: Cardiology

## 2022-03-31 ENCOUNTER — Other Ambulatory Visit: Payer: Self-pay | Admitting: Family Medicine

## 2022-03-31 DIAGNOSIS — I10 Essential (primary) hypertension: Secondary | ICD-10-CM

## 2022-04-02 ENCOUNTER — Encounter: Payer: Self-pay | Admitting: Cardiology

## 2022-04-07 ENCOUNTER — Other Ambulatory Visit: Payer: Self-pay | Admitting: Family Medicine

## 2022-04-07 DIAGNOSIS — J069 Acute upper respiratory infection, unspecified: Secondary | ICD-10-CM

## 2022-04-09 ENCOUNTER — Encounter (HOSPITAL_BASED_OUTPATIENT_CLINIC_OR_DEPARTMENT_OTHER): Payer: Medicaid Other | Admitting: Internal Medicine

## 2022-04-11 ENCOUNTER — Ambulatory Visit: Payer: Medicaid Other | Admitting: Family Medicine

## 2022-04-14 ENCOUNTER — Other Ambulatory Visit: Payer: Self-pay | Admitting: Family Medicine

## 2022-04-14 DIAGNOSIS — I10 Essential (primary) hypertension: Secondary | ICD-10-CM

## 2022-04-19 ENCOUNTER — Telehealth: Payer: Self-pay | Admitting: Cardiology

## 2022-04-19 NOTE — Telephone Encounter (Signed)
Called number that was listed, no way to leave voicemail. (04/19/2022 AP)

## 2022-04-23 ENCOUNTER — Other Ambulatory Visit: Payer: Self-pay | Admitting: Critical Care Medicine

## 2022-04-23 DIAGNOSIS — F32A Anxiety disorder, unspecified: Secondary | ICD-10-CM

## 2022-04-24 NOTE — Telephone Encounter (Signed)
Requested Prescriptions  Pending Prescriptions Disp Refills   busPIRone (BUSPAR) 15 MG tablet [Pharmacy Med Name: BUSPIRONE 15MG  TABLETS] 60 tablet 2    Sig: TAKE 1 TABLET(15 MG) BY MOUTH TWICE DAILY     Psychiatry: Anxiolytics/Hypnotics - Non-controlled Passed - 04/23/2022  3:20 AM      Passed - Valid encounter within last 12 months    Recent Outpatient Visits           1 month ago Abnormal EKG   [place], Enobong, MD   2 months ago Patient left before evaluation by [place] [name], MD   2 months ago Essential hypertension   [place], MD   3 months ago Essential hypertension   [place], Enobong, MD   4 months ago Essential hypertension   [place], RPH-CPP       Future Appointments             In 1 month [name], MD [place]

## 2022-04-28 ENCOUNTER — Other Ambulatory Visit: Payer: Self-pay | Admitting: Critical Care Medicine

## 2022-04-30 NOTE — Telephone Encounter (Signed)
Requested Prescriptions  Pending Prescriptions Disp Refills   pantoprazole (PROTONIX) 40 MG tablet [Pharmacy Med Name: PANTOPRAZOLE 40MG  TABLETS] 90 tablet 0    Sig: TAKE 1 TABLET(40 MG) BY MOUTH DAILY     Gastroenterology: Proton Pump Inhibitors Passed - 04/28/2022 12:55 PM      Passed - Valid encounter within last 12 months    Recent Outpatient Visits           1 month ago Abnormal EKG   [place], Enobong, MD   2 months ago Patient left before evaluation by [place], Deborah B, MD   2 months ago Essential hypertension   [place], MD   3 months ago Essential hypertension   [place], Enobong, MD   4 months ago Essential hypertension   [place], RPH-CPP       Future Appointments             In 1 month [name], MD [place]

## 2022-05-04 ENCOUNTER — Telehealth: Payer: Medicaid Other | Admitting: Nurse Practitioner

## 2022-05-04 DIAGNOSIS — K047 Periapical abscess without sinus: Secondary | ICD-10-CM

## 2022-05-04 MED ORDER — AMOXICILLIN-POT CLAVULANATE 875-125 MG PO TABS: 1.0000 | ORAL_TABLET | Freq: Two times a day (BID) | ORAL | 0 refills | Status: AC

## 2022-05-04 NOTE — Progress Notes (Signed)
E-Visit for Dental Pain  We are sorry that you are not feeling well.  Here is how we plan to help!  Based on what you have shared with me in the questionnaire, it sounds like you have an infection underneath one of your teeth.   Augmentin 875-125mg  twice a day for 7 days and you can continue to use Meloxicam [facility]) for pain, or ibuprofen and alternate with tylenol   It is imperative that you see a dentist within 10 days of this eVisit to determine the cause of the dental pain and be sure it is adequately treated  A toothache or tooth pain is caused when the nerve in the root of a tooth or surrounding a tooth is irritated. Dental (tooth) infection, decay, injury, or loss of a tooth are the most common causes of dental pain. Pain may also occur after an extraction (tooth is pulled out). Pain sometimes originates from other areas and radiates to the jaw, thus appearing to be tooth pain.Bacteria growing inside your mouth can contribute to gum disease and dental decay, both of which can cause pain. A toothache occurs from inflammation of the central portion of the tooth called pulp. The pulp contains nerve endings that are very sensitive to pain. Inflammation to the pulp or pulpitis may be caused by dental cavities, trauma, and infection.    HOME CARE:   For toothaches: Over-the-counter pain medications such as acetaminophen or ibuprofen may be used. Take these as directed on the package while you arrange for a dental appointment. Avoid very cold or hot foods, because they may make the pain worse. You may get relief from biting on a cotton ball soaked in oil of cloves. You can get oil of cloves at most drug stores.  For jaw pain:  Aspirin may be helpful for problems in the joint of the jaw in adults. If pain happens every time you open your mouth widely, the temporomandibular joint (TMJ) may be the source of the pain. Yawning or taking a large bite of food may worsen the pain. An appointment with  your doctor or dentist will help you find the cause.     GET HELP RIGHT AWAY IF:  You have a high fever or chills If you have had a recent head or face injury and develop headache, light headedness, nausea, vomiting, or other symptoms that concern you after an injury to your face or mouth, you could have a more serious injury in addition to your dental injury. A facial rash associated with a toothache: This condition may improve with medication. Contact your doctor for them to decide what is appropriate. Any jaw pain occurring with chest pain: Although jaw pain is most commonly caused by dental disease, it is sometimes referred pain from other areas. People with heart disease, especially people who have had stents placed, people with diabetes, or those who have had heart surgery may have jaw pain as a symptom of heart attack or angina. If your jaw or tooth pain is associated with lightheadedness, sweating, or shortness of breath, you should see a doctor as soon as possible. Trouble swallowing or excessive pain or bleeding from gums: If you have a history of a weakened immune system, diabetes, or steroid use, you may be more susceptible to infections. Infections can often be more severe and extensive or caused by unusual organisms. Dental and gum infections in people with these conditions may require more aggressive treatment. An abscess may need draining or IV antibiotics, for  example.  MAKE SURE YOU   Understand these instructions. Will watch your condition. Will get help right away if you are not doing well or get worse.  Thank you for choosing an e-visit.  Your e-visit answers were reviewed by a board certified advanced clinical practitioner to complete your personal care plan. Depending upon the condition, your plan could have included both over the counter or prescription medications.  Please review your pharmacy choice. Make sure the pharmacy is open so you can pick up prescription now.  If there is a problem, you may contact your provider through [employer] and have the prescription routed to another pharmacy.  Your safety is important to [place]. If you have drug allergies check your prescription carefully.   For the next 24 hours you can use MyChart to ask questions about today's visit, request a non-urgent call back, or ask for a work or school excuse. You will get an email in the next two days asking about your experience. I hope that your e-visit has been valuable and will speed your recovery.   Meds ordered this encounter  Medications   amoxicillin-clavulanate (AUGMENTIN) 875-125 MG tablet    Sig: Take 1 tablet by mouth 2 (two) times daily for 7 days. Take with food    Dispense:  14 tablet    Refill:  0     I spent approximately 5 minutes reviewing the patient's history, current symptoms and coordinating their care today.

## 2022-05-07 ENCOUNTER — Ambulatory Visit (HOSPITAL_BASED_OUTPATIENT_CLINIC_OR_DEPARTMENT_OTHER): Payer: Medicaid Other | Attending: Physician Assistant | Admitting: Internal Medicine

## 2022-05-22 ENCOUNTER — Ambulatory Visit: Payer: Medicaid Other | Admitting: Internal Medicine

## 2022-06-07 ENCOUNTER — Ambulatory Visit: Payer: Medicaid Other | Admitting: Family Medicine

## 2022-06-27 ENCOUNTER — Encounter: Payer: Medicaid Other | Admitting: Nurse Practitioner

## 2022-06-27 ENCOUNTER — Encounter: Payer: Self-pay | Admitting: Nurse Practitioner

## 2022-06-27 NOTE — Progress Notes (Signed)
No show for Video Visit

## 2022-07-03 ENCOUNTER — Telehealth: Payer: Medicaid Other | Admitting: Family Medicine

## 2022-07-03 DIAGNOSIS — R6889 Other general symptoms and signs: Secondary | ICD-10-CM

## 2022-07-03 MED ORDER — BENZONATATE 100 MG PO CAPS: 100.0000 mg | ORAL_CAPSULE | Freq: Three times a day (TID) | ORAL | 0 refills | Status: DC | PRN

## 2022-07-03 MED ORDER — PROMETHAZINE-DM 6.25-15 MG/5ML PO SYRP: 5.0000 mL | ORAL_SOLUTION | Freq: Four times a day (QID) | ORAL | 0 refills | Status: DC | PRN

## 2022-07-03 NOTE — Progress Notes (Signed)
Virtual Visit Consent   Ann Moran, you are scheduled for a virtual visit with a [facility] provider today. Just as with appointments in the office, your consent must be obtained to participate. Your consent will be active for this visit and any virtual visit you may have with one of our providers in the next 365 days. If you have a MyChart account, a copy of this consent can be sent to you electronically.  As this is a virtual visit, video technology does not allow for your provider to perform a traditional examination. This may limit your provider's ability to fully assess your condition. If your provider identifies any concerns that need to be evaluated in person or the need to arrange testing (such as labs, EKG, etc.), we will make arrangements to do so. Although advances in technology are sophisticated, we cannot ensure that it will always work on either your end or our end. If the connection with a video visit is poor, the visit may have to be switched to a telephone visit. With either a video or telephone visit, we are not always able to ensure that we have a secure connection.  By engaging in this virtual visit, you consent to the provision of healthcare and authorize for your insurance to be billed (if applicable) for the services provided during this visit. Depending on your insurance coverage, you may receive a charge related to this service.  I need to obtain your verbal consent now. Are you willing to proceed with your visit today? Ann Moran has provided verbal consent on 07/03/2022 for a virtual visit (video or telephone). Ann Mayo, NP  Date: 07/03/2022 10:33 AM  Virtual Visit via Video Note   I, Ann Moran, connected with  Ann Moran  ([id number], 02/21/1986) on 07/03/22 at 10:30 AM EDT by a video-enabled telemedicine application and verified that I am speaking with the correct person using two identifiers.  Location: Patient: Virtual Visit Location Patient:  Home Provider: Virtual Visit Location Provider: Home Office   I discussed the limitations of evaluation and management by telemedicine and the availability of in person appointments. The patient expressed understanding and agreed to proceed.    History of Present Illness: Ann Moran is a 37 y.o. who identifies as a female who was assigned female at birth, and is being seen today for flu like symptoms.  Onset was Saturday- 3 days ago- sneezing and coughing, progressed over the weekend. Associated symptoms are headache, sneezing, coughing-causing some chest pain, and tightness with breathing, stuffy, hoarseness, limited taste  Modifying factors are ibuprofen, benadryl Denies shortness of breath, fevers, chills  Exposure to sick contacts- known- flu exposure COVID test: Monday - neg Vaccines: COVID, unsure of Flu  Problems:  Patient Active Problem List   Diagnosis Date Noted   Sinusitis, acute 01/31/2022   Migraine 01/31/2022   Anxiety and depression 01/31/2022   OSA (obstructive sleep apnea) 01/25/2022   Smoking 02/23/2021   Patellar tendinitis of both knees 01/27/2021   Pain in joint involving right ankle and foot 01/27/2021   Thoracic back pain 12/23/2020   Right elbow pain 12/23/2020   Chronic pain of right knee 12/23/2020   Ankle fracture, left 08/09/2020   Dysphagia 05/20/2019   Encounter for other preprocedural examination 05/20/2019   PCOS (polycystic ovarian syndrome) 02/24/2019   Neck pain 02/24/2019   Morbid obesity with body mass index (BMI) of 45.0 to 49.9 in adult [facility]) 02/24/2019   Essential hypertension 02/24/2019  Gastroesophageal reflux disease 02/24/2019   Irregular menstrual cycle 01/14/2019   Abnormal uterine bleeding 01/14/2019   Chronic cough 01/14/2019    Allergies:  Allergies  Allergen Reactions   Prednisone Swelling    Had neck and shoulder swelling following taking   Medications:  Current Outpatient Medications:    albuterol (VENTOLIN  HFA) 108 (90 Base) MCG/ACT inhaler, INHALE 2 PUFFS INTO THE LUNGS EVERY 6 HOURS AS NEEDED FOR WHEEZING OR SHORTNESS OF BREATH, Disp: 54 g, Rfl: 1   amitriptyline (ELAVIL) 25 MG tablet, Take 1 tablet (25 mg total) by mouth at bedtime. For migraines, Disp: 40 tablet, Rfl: 2   amLODipine (NORVASC) 10 MG tablet, TAKE 1 TABLET(10 MG) BY MOUTH DAILY, Disp: 90 tablet, Rfl: 1   benzonatate (TESSALON) 100 MG capsule, Take 1 capsule (100 mg total) by mouth 3 (three) times daily as needed for cough., Disp: 30 capsule, Rfl: 0   Blood Pressure Monitoring (BLOOD PRESSURE CUFF) MISC, Use to check blood pressure once daily., Disp: 1 each, Rfl: 0   busPIRone (BUSPAR) 15 MG tablet, TAKE 1 TABLET(15 MG) BY MOUTH TWICE DAILY, Disp: 60 tablet, Rfl: 2   chlorpheniramine-HYDROcodone (TUSSIONEX) 10-8 MG/5ML, Take 5 mLs by mouth every 12 (twelve) hours as needed for cough., Disp: 115 mL, Rfl: 0   fluticasone (FLONASE) 50 MCG/ACT nasal spray, Place 2 sprays into both nostrils daily., Disp: 16 g, Rfl: 6   hydrOXYzine (ATARAX) 25 MG tablet, TAKE 1 TABLET(25 MG) BY MOUTH EVERY 8 HOURS AS NEEDED FOR ANXIETY, Disp: 60 tablet, Rfl: 0   levocetirizine (XYZAL) 5 MG tablet, Take 1 tablet (5 mg total) by mouth every evening., Disp: 90 tablet, Rfl: 0   meloxicam (MOBIC) 7.5 MG tablet, Take 1 tablet (7.5 mg total) by mouth daily., Disp: 30 tablet, Rfl: 1   mupirocin ointment (BACTROBAN) 2 %, Apply 1 Application topically 3 (three) times daily., Disp: 30 g, Rfl: 0   pantoprazole (PROTONIX) 40 MG tablet, TAKE 1 TABLET(40 MG) BY MOUTH DAILY, Disp: 90 tablet, Rfl: 0   pseudoephedrine (SUDAFED) 60 MG tablet, Take 1 tablet (60 mg total) by mouth every 8 (eight) hours as needed for congestion., Disp: 30 tablet, Rfl: 0   sertraline (ZOLOFT) 50 MG tablet, TAKE 1 TABLET(50 MG) BY MOUTH DAILY, Disp: 60 tablet, Rfl: 1   tiZANidine (ZANAFLEX) 4 MG tablet, Take 1 tablet (4 mg total) by mouth at bedtime., Disp: 30 tablet, Rfl: 0   traZODone (DESYREL)  100 MG tablet, Take 1 tablet (100 mg total) by mouth at bedtime as needed for sleep., Disp: 30 tablet, Rfl: 2   valsartan-hydrochlorothiazide (DIOVAN-HCT) 160-25 MG tablet, Take 1 tablet by mouth daily., Disp: 90 tablet, Rfl: 3  Observations/Objective: Patient is well-developed, well-nourished in no acute distress.  Resting comfortably  at home.  Head is normocephalic, atraumatic.  No labored breathing.  Speech is clear and coherent with logical content.  Patient is alert and oriented at baseline.  Congestion tone noted   Assessment and Plan:  1. Flu-like symptoms  - promethazine-dextromethorphan (PROMETHAZINE-DM) 6.25-15 MG/5ML syrup; Take 5 mLs by mouth 4 (four) times daily as needed for cough.  Dispense: 118 mL; Refill: 0 - benzonatate (TESSALON) 100 MG capsule; Take 1 capsule (100 mg total) by mouth 3 (three) times daily as needed for cough.  Dispense: 30 capsule; Refill: 0  -Take meds as prescribed -Rest -Use a cool mist humidifier especially during the winter months when heat dries out the air. - Use saline nose sprays frequently to  help soothe nasal passages and promote drainage. -Saline irrigations of the nose can be very helpful if done frequently.             * 4X daily for 1 week*             * Use of a nettie pot can be helpful with this.  *Follow directions with this* *Boiled or distilled water only -stay hydrated by drinking plenty of fluids - Keep thermostat turn down low to prevent drying out sinuses - For any cough or congestion- robitussin DM or Delsym as needed - For fever or aches or pains- take tylenol or ibuprofen as directed on bottle             * for fevers greater than 101 orally you may alternate ibuprofen and tylenol every 3 hours.  If you do not improve you will need a follow up visit in person.                Reviewed side effects, risks and benefits of medication.    Patient acknowledged agreement and understanding of the plan.    Past Medical,  Surgical, Social History, Allergies, and Medications have been Reviewed.     Follow Up Instructions: I discussed the assessment and treatment plan with the patient. The patient was provided an opportunity to ask questions and all were answered. The patient agreed with the plan and demonstrated an understanding of the instructions.  A copy of instructions were sent to the patient via MyChart unless otherwise noted below.    The patient was advised to call back or seek an in-person evaluation if the symptoms worsen or if the condition fails to improve as anticipated.  Time:  I spent 9 minutes with the patient via telehealth technology discussing the above problems/concerns.    Ann Mayo, NP

## 2022-07-03 NOTE — Patient Instructions (Signed)
[name], thank you for joining Perlie Mayo, NP for today's virtual visit.  While this provider is not your primary care provider (PCP), if your PCP is located in our provider database this encounter information will be shared with them immediately following your visit.   [facility] account gives you access to today's visit and all your visits, tests, and labs performed at [facility] " click here if you don't have a [facility] account or go to [email or web address]  Consent: (Patient) Ann Moran provided verbal consent for this virtual visit at the beginning of the encounter.  Current Medications:  Current Outpatient Medications:    benzonatate (TESSALON) 100 MG capsule, Take 1 capsule (100 mg total) by mouth 3 (three) times daily as needed for cough., Disp: 30 capsule, Rfl: 0   promethazine-dextromethorphan (PROMETHAZINE-DM) 6.25-15 MG/5ML syrup, Take 5 mLs by mouth 4 (four) times daily as needed for cough., Disp: 118 mL, Rfl: 0   albuterol (VENTOLIN HFA) 108 (90 Base) MCG/ACT inhaler, INHALE 2 PUFFS INTO THE LUNGS EVERY 6 HOURS AS NEEDED FOR WHEEZING OR SHORTNESS OF BREATH, Disp: 54 g, Rfl: 1   amitriptyline (ELAVIL) 25 MG tablet, Take 1 tablet (25 mg total) by mouth at bedtime. For migraines, Disp: 40 tablet, Rfl: 2   amLODipine (NORVASC) 10 MG tablet, TAKE 1 TABLET(10 MG) BY MOUTH DAILY, Disp: 90 tablet, Rfl: 1   Blood Pressure Monitoring (BLOOD PRESSURE CUFF) MISC, Use to check blood pressure once daily., Disp: 1 each, Rfl: 0   busPIRone (BUSPAR) 15 MG tablet, TAKE 1 TABLET(15 MG) BY MOUTH TWICE DAILY, Disp: 60 tablet, Rfl: 2   fluticasone (FLONASE) 50 MCG/ACT nasal spray, Place 2 sprays into both nostrils daily., Disp: 16 g, Rfl: 6   hydrOXYzine (ATARAX) 25 MG tablet, TAKE 1 TABLET(25 MG) BY MOUTH EVERY 8 HOURS AS NEEDED FOR ANXIETY, Disp: 60 tablet, Rfl: 0   levocetirizine (XYZAL) 5 MG tablet, Take 1 tablet (5 mg total) by mouth every  evening., Disp: 90 tablet, Rfl: 0   meloxicam (MOBIC) 7.5 MG tablet, Take 1 tablet (7.5 mg total) by mouth daily., Disp: 30 tablet, Rfl: 1   mupirocin ointment (BACTROBAN) 2 %, Apply 1 Application topically 3 (three) times daily., Disp: 30 g, Rfl: 0   pantoprazole (PROTONIX) 40 MG tablet, TAKE 1 TABLET(40 MG) BY MOUTH DAILY, Disp: 90 tablet, Rfl: 0   pseudoephedrine (SUDAFED) 60 MG tablet, Take 1 tablet (60 mg total) by mouth every 8 (eight) hours as needed for congestion., Disp: 30 tablet, Rfl: 0   sertraline (ZOLOFT) 50 MG tablet, TAKE 1 TABLET(50 MG) BY MOUTH DAILY, Disp: 60 tablet, Rfl: 1   tiZANidine (ZANAFLEX) 4 MG tablet, Take 1 tablet (4 mg total) by mouth at bedtime., Disp: 30 tablet, Rfl: 0   traZODone (DESYREL) 100 MG tablet, Take 1 tablet (100 mg total) by mouth at bedtime as needed for sleep., Disp: 30 tablet, Rfl: 2   valsartan-hydrochlorothiazide (DIOVAN-HCT) 160-25 MG tablet, Take 1 tablet by mouth daily., Disp: 90 tablet, Rfl: 3   Medications ordered in this encounter:  Meds ordered this encounter  Medications   promethazine-dextromethorphan (PROMETHAZINE-DM) 6.25-15 MG/5ML syrup    Sig: Take 5 mLs by mouth 4 (four) times daily as needed for cough.    Dispense:  118 mL    Refill:  0    Order Specific Question:   Supervising Provider    Answer:   Chase Picket [id number]   benzonatate (TESSALON) 100  MG capsule    Sig: Take 1 capsule (100 mg total) by mouth 3 (three) times daily as needed for cough.    Dispense:  30 capsule    Refill:  0    Order Specific Question:   Supervising Provider    Answer:   Chase Picket [id number]     *If you need refills on other medications prior to your next appointment, please contact your pharmacy*  Follow-Up: Call back or seek an in-person evaluation if the symptoms worsen or if the condition fails to improve as anticipated.  [place] [facility] [phone]  Other Instructions  -Take meds as prescribed -Rest -Use a  cool mist humidifier especially during the winter months when heat dries out the air. - Use saline nose sprays frequently to help soothe nasal passages and promote drainage. -Saline irrigations of the nose can be very helpful if done frequently.             * 4X daily for 1 week*             * Use of a nettie pot can be helpful with this.  *Follow directions with this* *Boiled or distilled water only -stay hydrated by drinking plenty of fluids - Keep thermostat turn down low to prevent drying out sinuses - For any cough or congestion- robitussin DM or Delsym as needed - For fever or aches or pains- take tylenol or ibuprofen as directed on bottle             * for fevers greater than 101 orally you may alternate ibuprofen and tylenol every 3 hours.  If you do not improve you will need a follow up visit in person.                 If you have been instructed to have an in-person evaluation today at a local Urgent Care facility, please use the link below. It will take you to a list of all of our available [facility], including address, phone number and hours of operation. Please do not delay care.  [facility]  If you or a family member do not have a primary care provider, use the link below to schedule a visit and establish care. When you choose a [facility] primary care physician or advanced practice provider, you gain a long-term partner in health. Find a Primary Care Provider  Learn more about [facility] in-office and virtual care options: [facility]

## 2022-07-17 ENCOUNTER — Telehealth: Payer: Medicaid Other | Admitting: Physician Assistant

## 2022-07-17 DIAGNOSIS — H1033 Unspecified acute conjunctivitis, bilateral: Secondary | ICD-10-CM

## 2022-07-17 MED ORDER — LEVOCETIRIZINE DIHYDROCHLORIDE 5 MG PO TABS: 5.0000 mg | ORAL_TABLET | Freq: Every evening | ORAL | 0 refills | Status: DC

## 2022-07-17 MED ORDER — POLYMYXIN B-TRIMETHOPRIM 10000-0.1 UNIT/ML-% OP SOLN: OPHTHALMIC | 0 refills | Status: DC

## 2022-07-17 NOTE — Progress Notes (Signed)
Virtual Visit Consent   Ann Moran, you are scheduled for a virtual visit with a [facility] provider today. Just as with appointments in the office, your consent must be obtained to participate. Your consent will be active for this visit and any virtual visit you may have with one of our providers in the next 365 days. If you have a MyChart account, a copy of this consent can be sent to you electronically.  As this is a virtual visit, video technology does not allow for your provider to perform a traditional examination. This may limit your provider's ability to fully assess your condition. If your provider identifies any concerns that need to be evaluated in person or the need to arrange testing (such as labs, EKG, etc.), we will make arrangements to do so. Although advances in technology are sophisticated, we cannot ensure that it will always work on either your end or our end. If the connection with a video visit is poor, the visit may have to be switched to a telephone visit. With either a video or telephone visit, we are not always able to ensure that we have a secure connection.  By engaging in this virtual visit, you consent to the provision of healthcare and authorize for your insurance to be billed (if applicable) for the services provided during this visit. Depending on your insurance coverage, you may receive a charge related to this service.  I need to obtain your verbal consent now. Are you willing to proceed with your visit today? Ann Moran has provided verbal consent on 07/17/2022 for a virtual visit (video or telephone). Leeanne Rio, [place]  Date: 07/17/2022 11:18 AM  Virtual Visit via Video Note   I, Leeanne Rio, connected with  Ann Moran  ([id number], 1985/05/04) on 07/17/22 at 11:00 AM EDT by a video-enabled telemedicine application and verified that I am speaking with the correct person using two identifiers.  Location: Patient: Virtual Visit  Location Patient: Home Provider: Virtual Visit Location Provider: Home Office   I discussed the limitations of evaluation and management by telemedicine and the availability of in person appointments. The patient expressed understanding and agreed to proceed.    History of Present Illness: Ann Moran is a 37 y.o. who identifies as a female who was assigned female at birth, and is being seen today for possible pink eye. Daughter is currently being treated for this. Patient notes bilateral eye irritation and burning over the past day. Some drainage/matting overnight. Denies fever, chills. Some mild cough. Denies vision changes. Does not wear contact lenses.  HPI: HPI  Problems:  Patient Active Problem List   Diagnosis Date Noted   Sinusitis, acute 01/31/2022   Migraine 01/31/2022   Anxiety and depression 01/31/2022   OSA (obstructive sleep apnea) 01/25/2022   Smoking 02/23/2021   Patellar tendinitis of both knees 01/27/2021   Pain in joint involving right ankle and foot 01/27/2021   Thoracic back pain 12/23/2020   Right elbow pain 12/23/2020   Chronic pain of right knee 12/23/2020   Ankle fracture, left 08/09/2020   Dysphagia 05/20/2019   Encounter for other preprocedural examination 05/20/2019   PCOS (polycystic ovarian syndrome) 02/24/2019   Neck pain 02/24/2019   Morbid obesity with body mass index (BMI) of 45.0 to 49.9 in adult [facility]) 02/24/2019   Essential hypertension 02/24/2019   Gastroesophageal reflux disease 02/24/2019   Irregular menstrual cycle 01/14/2019   Abnormal uterine bleeding 01/14/2019   Chronic cough 01/14/2019  Allergies:  Allergies  Allergen Reactions   Prednisone Swelling    Had neck and shoulder swelling following taking   Medications:  Current Outpatient Medications:    trimethoprim-polymyxin b (POLYTRIM) ophthalmic solution, Apply 1-2 drops into affected eye QID x 5 days., Disp: 10 mL, Rfl: 0   albuterol (VENTOLIN HFA) 108 (90 Base) MCG/ACT  inhaler, INHALE 2 PUFFS INTO THE LUNGS EVERY 6 HOURS AS NEEDED FOR WHEEZING OR SHORTNESS OF BREATH, Disp: 54 g, Rfl: 1   amitriptyline (ELAVIL) 25 MG tablet, Take 1 tablet (25 mg total) by mouth at bedtime. For migraines, Disp: 40 tablet, Rfl: 2   amLODipine (NORVASC) 10 MG tablet, TAKE 1 TABLET(10 MG) BY MOUTH DAILY, Disp: 90 tablet, Rfl: 1   Blood Pressure Monitoring (BLOOD PRESSURE CUFF) MISC, Use to check blood pressure once daily., Disp: 1 each, Rfl: 0   busPIRone (BUSPAR) 15 MG tablet, TAKE 1 TABLET(15 MG) BY MOUTH TWICE DAILY, Disp: 60 tablet, Rfl: 2   fluticasone (FLONASE) 50 MCG/ACT nasal spray, Place 2 sprays into both nostrils daily., Disp: 16 g, Rfl: 6   hydrOXYzine (ATARAX) 25 MG tablet, TAKE 1 TABLET(25 MG) BY MOUTH EVERY 8 HOURS AS NEEDED FOR ANXIETY, Disp: 60 tablet, Rfl: 0   levocetirizine (XYZAL) 5 MG tablet, Take 1 tablet (5 mg total) by mouth every evening., Disp: 30 tablet, Rfl: 0   meloxicam (MOBIC) 7.5 MG tablet, Take 1 tablet (7.5 mg total) by mouth daily., Disp: 30 tablet, Rfl: 1   mupirocin ointment (BACTROBAN) 2 %, Apply 1 Application topically 3 (three) times daily., Disp: 30 g, Rfl: 0   pantoprazole (PROTONIX) 40 MG tablet, TAKE 1 TABLET(40 MG) BY MOUTH DAILY, Disp: 90 tablet, Rfl: 0   pseudoephedrine (SUDAFED) 60 MG tablet, Take 1 tablet (60 mg total) by mouth every 8 (eight) hours as needed for congestion., Disp: 30 tablet, Rfl: 0   sertraline (ZOLOFT) 50 MG tablet, TAKE 1 TABLET(50 MG) BY MOUTH DAILY, Disp: 60 tablet, Rfl: 1   tiZANidine (ZANAFLEX) 4 MG tablet, Take 1 tablet (4 mg total) by mouth at bedtime., Disp: 30 tablet, Rfl: 0   traZODone (DESYREL) 100 MG tablet, Take 1 tablet (100 mg total) by mouth at bedtime as needed for sleep., Disp: 30 tablet, Rfl: 2   valsartan-hydrochlorothiazide (DIOVAN-HCT) 160-25 MG tablet, Take 1 tablet by mouth daily., Disp: 90 tablet, Rfl: 3  Observations/Objective: Patient is well-developed, well-nourished in no acute distress.   Resting comfortably at home.  Head is normocephalic, atraumatic.  No labored breathing. Speech is clear and coherent with logical content.  Patient is alert and oriented at baseline.   Assessment and Plan: 1. Acute bacterial conjunctivitis of both eyes - trimethoprim-polymyxin b (POLYTRIM) ophthalmic solution; Apply 1-2 drops into affected eye QID x 5 days.  Dispense: 10 mL; Refill: 0  Supportive measures and OTC medications reviewed. Resume use of Xyzal. Start Polytrim per orders.   Follow Up Instructions: I discussed the assessment and treatment plan with the patient. The patient was provided an opportunity to ask questions and all were answered. The patient agreed with the plan and demonstrated an understanding of the instructions.  A copy of instructions were sent to the patient via MyChart unless otherwise noted below.   The patient was advised to call back or seek an in-person evaluation if the symptoms worsen or if the condition fails to improve as anticipated.  Time:  I spent 10 minutes with the patient via telehealth technology discussing the above problems/concerns.  Leeanne Rio, PA-C

## 2022-07-17 NOTE — Patient Instructions (Addendum)
[name], thank you for joining Leeanne Rio, PA-C for today's virtual visit.  While this provider is not your primary care provider (PCP), if your PCP is located in our provider database this encounter information will be shared with them immediately following your visit.   [place] account gives you access to today's visit and all your visits, tests, and labs performed at [facility] " click here if you don't have a [facility] account or go to [email or web address]  Consent: (Patient) Ann Moran provided verbal consent for this virtual visit at the beginning of the encounter.  Current Medications:  Current Outpatient Medications:    albuterol (VENTOLIN HFA) 108 (90 Base) MCG/ACT inhaler, INHALE 2 PUFFS INTO THE LUNGS EVERY 6 HOURS AS NEEDED FOR WHEEZING OR SHORTNESS OF BREATH, Disp: 54 g, Rfl: 1   amitriptyline (ELAVIL) 25 MG tablet, Take 1 tablet (25 mg total) by mouth at bedtime. For migraines, Disp: 40 tablet, Rfl: 2   amLODipine (NORVASC) 10 MG tablet, TAKE 1 TABLET(10 MG) BY MOUTH DAILY, Disp: 90 tablet, Rfl: 1   benzonatate (TESSALON) 100 MG capsule, Take 1 capsule (100 mg total) by mouth 3 (three) times daily as needed for cough., Disp: 30 capsule, Rfl: 0   Blood Pressure Monitoring (BLOOD PRESSURE CUFF) MISC, Use to check blood pressure once daily., Disp: 1 each, Rfl: 0   busPIRone (BUSPAR) 15 MG tablet, TAKE 1 TABLET(15 MG) BY MOUTH TWICE DAILY, Disp: 60 tablet, Rfl: 2   fluticasone (FLONASE) 50 MCG/ACT nasal spray, Place 2 sprays into both nostrils daily., Disp: 16 g, Rfl: 6   hydrOXYzine (ATARAX) 25 MG tablet, TAKE 1 TABLET(25 MG) BY MOUTH EVERY 8 HOURS AS NEEDED FOR ANXIETY, Disp: 60 tablet, Rfl: 0   levocetirizine (XYZAL) 5 MG tablet, Take 1 tablet (5 mg total) by mouth every evening., Disp: 90 tablet, Rfl: 0   meloxicam (MOBIC) 7.5 MG tablet, Take 1 tablet (7.5 mg total) by mouth daily., Disp: 30 tablet, Rfl: 1   mupirocin  ointment (BACTROBAN) 2 %, Apply 1 Application topically 3 (three) times daily., Disp: 30 g, Rfl: 0   pantoprazole (PROTONIX) 40 MG tablet, TAKE 1 TABLET(40 MG) BY MOUTH DAILY, Disp: 90 tablet, Rfl: 0   promethazine-dextromethorphan (PROMETHAZINE-DM) 6.25-15 MG/5ML syrup, Take 5 mLs by mouth 4 (four) times daily as needed for cough., Disp: 118 mL, Rfl: 0   pseudoephedrine (SUDAFED) 60 MG tablet, Take 1 tablet (60 mg total) by mouth every 8 (eight) hours as needed for congestion., Disp: 30 tablet, Rfl: 0   sertraline (ZOLOFT) 50 MG tablet, TAKE 1 TABLET(50 MG) BY MOUTH DAILY, Disp: 60 tablet, Rfl: 1   tiZANidine (ZANAFLEX) 4 MG tablet, Take 1 tablet (4 mg total) by mouth at bedtime., Disp: 30 tablet, Rfl: 0   traZODone (DESYREL) 100 MG tablet, Take 1 tablet (100 mg total) by mouth at bedtime as needed for sleep., Disp: 30 tablet, Rfl: 2   valsartan-hydrochlorothiazide (DIOVAN-HCT) 160-25 MG tablet, Take 1 tablet by mouth daily., Disp: 90 tablet, Rfl: 3   Medications ordered in this encounter:  No orders of the defined types were placed in this encounter.    *If you need refills on other medications prior to your next appointment, please contact your pharmacy*  Follow-Up: Call back or seek an in-person evaluation if the symptoms worsen or if the condition fails to improve as anticipated.  [place] [phone]  Other Instructions Keep hands washed and avoid rubbing  the eyes. Apply warm compresses as discussed. Restart your Xyzal. Use the Polytrim drops as directed. If not resolving or any new or worsening symptoms despite treatment, you need an in-person evaluation.    If you have been instructed to have an in-person evaluation today at a local Urgent Care facility, please use the link below. It will take you to a list of all of our available [facility], including address, phone number and hours of operation. Please do not delay care.  [facility]  If you or a family member do not have a primary care provider, use the link below to schedule a visit and establish care. When you choose a [facility] primary care physician or advanced practice provider, you gain a long-term partner in health. Find a Primary Care Provider  Learn more about [facility]'s in-office and virtual care options: [facility]

## 2022-07-25 ENCOUNTER — Telehealth: Payer: Medicaid Other | Admitting: Physician Assistant

## 2022-07-25 DIAGNOSIS — H1012 Acute atopic conjunctivitis, left eye: Secondary | ICD-10-CM

## 2022-07-25 DIAGNOSIS — H00014 Hordeolum externum left upper eyelid: Secondary | ICD-10-CM

## 2022-07-25 MED ORDER — ERYTHROMYCIN 5 MG/GM OP OINT: 1.0000 | TOPICAL_OINTMENT | Freq: Every day | OPHTHALMIC | 0 refills | Status: DC

## 2022-07-25 MED ORDER — OLOPATADINE HCL 0.1 % OP SOLN: 1.0000 [drp] | Freq: Two times a day (BID) | OPHTHALMIC | 0 refills | Status: DC

## 2022-07-25 NOTE — Patient Instructions (Signed)
[name], thank you for joining Mar Daring, PA-C for today's virtual visit.  While this provider is not your primary care provider (PCP), if your PCP is located in our provider database this encounter information will be shared with them immediately following your visit.   [facility] account gives you access to today's visit and all your visits, tests, and labs performed at [facility] " click here if you don't have a [facility] account or go to [email or web address]  Consent: (Patient) Ann Moran provided verbal consent for this virtual visit at the beginning of the encounter.  Current Medications:  Current Outpatient Medications:    erythromycin ophthalmic ointment, Place 1 Application into the left eye at bedtime., Disp: 3.5 g, Rfl: 0   albuterol (VENTOLIN HFA) 108 (90 Base) MCG/ACT inhaler, INHALE 2 PUFFS INTO THE LUNGS EVERY 6 HOURS AS NEEDED FOR WHEEZING OR SHORTNESS OF BREATH, Disp: 54 g, Rfl: 1   amitriptyline (ELAVIL) 25 MG tablet, Take 1 tablet (25 mg total) by mouth at bedtime. For migraines, Disp: 40 tablet, Rfl: 2   amLODipine (NORVASC) 10 MG tablet, TAKE 1 TABLET(10 MG) BY MOUTH DAILY, Disp: 90 tablet, Rfl: 1   Blood Pressure Monitoring (BLOOD PRESSURE CUFF) MISC, Use to check blood pressure once daily., Disp: 1 each, Rfl: 0   busPIRone (BUSPAR) 15 MG tablet, TAKE 1 TABLET(15 MG) BY MOUTH TWICE DAILY, Disp: 60 tablet, Rfl: 2   fluticasone (FLONASE) 50 MCG/ACT nasal spray, Place 2 sprays into both nostrils daily., Disp: 16 g, Rfl: 6   hydrOXYzine (ATARAX) 25 MG tablet, TAKE 1 TABLET(25 MG) BY MOUTH EVERY 8 HOURS AS NEEDED FOR ANXIETY, Disp: 60 tablet, Rfl: 0   levocetirizine (XYZAL) 5 MG tablet, Take 1 tablet (5 mg total) by mouth every evening., Disp: 30 tablet, Rfl: 0   meloxicam (MOBIC) 7.5 MG tablet, Take 1 tablet (7.5 mg total) by mouth daily., Disp: 30 tablet, Rfl: 1   mupirocin ointment (BACTROBAN) 2 %, Apply 1  Application topically 3 (three) times daily., Disp: 30 g, Rfl: 0   olopatadine (PATANOL) 0.1 % ophthalmic solution, Place 1 drop into the left eye 2 (two) times daily., Disp: 5 mL, Rfl: 0   pantoprazole (PROTONIX) 40 MG tablet, TAKE 1 TABLET(40 MG) BY MOUTH DAILY, Disp: 90 tablet, Rfl: 0   pseudoephedrine (SUDAFED) 60 MG tablet, Take 1 tablet (60 mg total) by mouth every 8 (eight) hours as needed for congestion., Disp: 30 tablet, Rfl: 0   sertraline (ZOLOFT) 50 MG tablet, TAKE 1 TABLET(50 MG) BY MOUTH DAILY, Disp: 60 tablet, Rfl: 1   tiZANidine (ZANAFLEX) 4 MG tablet, Take 1 tablet (4 mg total) by mouth at bedtime., Disp: 30 tablet, Rfl: 0   traZODone (DESYREL) 100 MG tablet, Take 1 tablet (100 mg total) by mouth at bedtime as needed for sleep., Disp: 30 tablet, Rfl: 2   trimethoprim-polymyxin b (POLYTRIM) ophthalmic solution, Apply 1-2 drops into affected eye QID x 5 days., Disp: 10 mL, Rfl: 0   valsartan-hydrochlorothiazide (DIOVAN-HCT) 160-25 MG tablet, Take 1 tablet by mouth daily., Disp: 90 tablet, Rfl: 3   Medications ordered in this encounter:  Meds ordered this encounter  Medications   erythromycin ophthalmic ointment    Sig: Place 1 Application into the left eye at bedtime.    Dispense:  3.5 g    Refill:  0    Order Specific Question:   Supervising Provider    Answer:   Chase Picket [  [id number]     *If you need refills on other medications prior to your next appointment, please contact your pharmacy*  Follow-Up: Call back or seek an in-person evaluation if the symptoms worsen or if the condition fails to improve as anticipated.  [place] [phone]  Other Instructions  Stye A stye, also known as a hordeolum, is a bump that forms on an eyelid. It may look like a pimple next to the eyelash. A stye can form inside the eyelid (internal stye) or outside the eyelid (external stye). A stye can cause redness, swelling, and pain on the eyelid. Styes are very  common. Anyone can get them at any age. They usually occur in just one eye at a time, but you may have more than one in either eye. What are the causes? A stye is caused by an infection. The infection is almost always caused by bacteria called Staphylococcus aureus. This is a common type of bacteria that lives on the skin. An internal stye may result from an infected oil-producing gland inside the eyelid. An external stye may be caused by an infection at the base of the eyelash (hair follicle). What increases the risk? You are more likely to develop a stye if: You have had a stye before. You have any of these conditions: Red, itchy, inflamed eyelids (blepharitis). A skin condition such as seborrheic dermatitis or rosacea. High fat levels in your blood (lipids). Dry eyes. What are the signs or symptoms? The most common symptom of a stye is eyelid pain. Internal styes are more painful than external styes. Other symptoms may include: Painful swelling of your eyelid. A scratchy feeling in your eye. Tearing and redness of your eye. A pimple-like bump on the edge of the eyelid. Pus draining from the stye. How is this diagnosed? Your health care provider may be able to diagnose a stye just by examining your eye. The health care provider may also check to make sure: You do not have a fever or other signs of a more serious infection. The infection has not spread to other parts of your eye or areas around your eye. How is this treated? Most styes will clear up in a few days without treatment or with warm compresses applied to the area. You may need to use antibiotic drops or ointment to treat an infection. Sometimes, steroid drops or ointment are used in addition to antibiotics. In some cases, your health care provider may give you a small steroid injection in the eyelid. If your stye does not heal with routine treatment, your health care provider may drain pus from the stye using a thin blade or  needle. This may be done if the stye is large, causing a lot of pain, or affecting your vision. Follow these instructions at home: Take over-the-counter and prescription medicines only as told by your health care provider. This includes eye drops or ointments. If you were prescribed an antibiotic medicine, steroid medicine, or both, apply or use them as told by your health care provider. Do not stop using the medicine even if your condition improves. Apply a warm, wet cloth (warm compress) to your eye for 5-10 minutes, 4 to 6 times a day. Clean the affected eyelid as directed by your health care provider. Do not wear contact lenses or eye makeup until your stye has healed and your health care provider says that it is safe. Do not try to pop or drain the stye. Do not rub your  eye. Contact a health care provider if: You have chills or a fever. Your stye does not go away after several days. Your stye affects your vision. Your eyeball becomes swollen, red, or painful. Get help right away if: You have pain when moving your eye around. Summary A stye is a bump that forms on an eyelid. It may look like a pimple next to the eyelash. A stye can form inside the eyelid (internal stye) or outside the eyelid (external stye). A stye can cause redness, swelling, and pain on the eyelid. Your health care provider may be able to diagnose a stye just by examining your eye. Apply a warm, wet cloth (warm compress) to your eye for 5-10 minutes, 4 to 6 times a day. This information is not intended to replace advice given to you by your health care provider. Make sure you discuss any questions you have with your health care provider. Document Revised: 06/15/2020 Document Reviewed: 06/15/2020 Elsevier Patient Education  Day.    If you have been instructed to have an in-person evaluation today at a local Urgent Care facility, please use the link below. It will take you to a list of all of our  available [facility], including address, phone number and hours of operation. Please do not delay care.  [facility]  If you or a family member do not have a primary care provider, use the link below to schedule a visit and establish care. When you choose a [facility] primary care physician or advanced practice provider, you gain a long-term partner in health. Find a Primary Care Provider  Learn more about [facility]'s in-office and virtual care options: [facility] Now

## 2022-07-25 NOTE — Patient Instructions (Signed)
[name], thank you for joining Mar Daring, PA-C for today's virtual visit.  While this provider is not your primary care provider (PCP), if your PCP is located in our provider database this encounter information will be shared with them immediately following your visit.   [facility] account gives you access to today's visit and all your visits, tests, and labs performed at [facility] " click here if you don't have a [facility] account or go to [email or web address]  Consent: (Patient) Ann Moran provided verbal consent for this virtual visit at the beginning of the encounter.  Current Medications:  Current Outpatient Medications:    olopatadine (PATANOL) 0.1 % ophthalmic solution, Place 1 drop into the left eye 2 (two) times daily., Disp: 5 mL, Rfl: 0   albuterol (VENTOLIN HFA) 108 (90 Base) MCG/ACT inhaler, INHALE 2 PUFFS INTO THE LUNGS EVERY 6 HOURS AS NEEDED FOR WHEEZING OR SHORTNESS OF BREATH, Disp: 54 g, Rfl: 1   amitriptyline (ELAVIL) 25 MG tablet, Take 1 tablet (25 mg total) by mouth at bedtime. For migraines, Disp: 40 tablet, Rfl: 2   amLODipine (NORVASC) 10 MG tablet, TAKE 1 TABLET(10 MG) BY MOUTH DAILY, Disp: 90 tablet, Rfl: 1   Blood Pressure Monitoring (BLOOD PRESSURE CUFF) MISC, Use to check blood pressure once daily., Disp: 1 each, Rfl: 0   busPIRone (BUSPAR) 15 MG tablet, TAKE 1 TABLET(15 MG) BY MOUTH TWICE DAILY, Disp: 60 tablet, Rfl: 2   fluticasone (FLONASE) 50 MCG/ACT nasal spray, Place 2 sprays into both nostrils daily., Disp: 16 g, Rfl: 6   hydrOXYzine (ATARAX) 25 MG tablet, TAKE 1 TABLET(25 MG) BY MOUTH EVERY 8 HOURS AS NEEDED FOR ANXIETY, Disp: 60 tablet, Rfl: 0   levocetirizine (XYZAL) 5 MG tablet, Take 1 tablet (5 mg total) by mouth every evening., Disp: 30 tablet, Rfl: 0   meloxicam (MOBIC) 7.5 MG tablet, Take 1 tablet (7.5 mg total) by mouth daily., Disp: 30 tablet, Rfl: 1   mupirocin ointment (BACTROBAN) 2 %,  Apply 1 Application topically 3 (three) times daily., Disp: 30 g, Rfl: 0   pantoprazole (PROTONIX) 40 MG tablet, TAKE 1 TABLET(40 MG) BY MOUTH DAILY, Disp: 90 tablet, Rfl: 0   pseudoephedrine (SUDAFED) 60 MG tablet, Take 1 tablet (60 mg total) by mouth every 8 (eight) hours as needed for congestion., Disp: 30 tablet, Rfl: 0   sertraline (ZOLOFT) 50 MG tablet, TAKE 1 TABLET(50 MG) BY MOUTH DAILY, Disp: 60 tablet, Rfl: 1   tiZANidine (ZANAFLEX) 4 MG tablet, Take 1 tablet (4 mg total) by mouth at bedtime., Disp: 30 tablet, Rfl: 0   traZODone (DESYREL) 100 MG tablet, Take 1 tablet (100 mg total) by mouth at bedtime as needed for sleep., Disp: 30 tablet, Rfl: 2   trimethoprim-polymyxin b (POLYTRIM) ophthalmic solution, Apply 1-2 drops into affected eye QID x 5 days., Disp: 10 mL, Rfl: 0   valsartan-hydrochlorothiazide (DIOVAN-HCT) 160-25 MG tablet, Take 1 tablet by mouth daily., Disp: 90 tablet, Rfl: 3   Medications ordered in this encounter:  Meds ordered this encounter  Medications   olopatadine (PATANOL) 0.1 % ophthalmic solution    Sig: Place 1 drop into the left eye 2 (two) times daily.    Dispense:  5 mL    Refill:  0    Order Specific Question:   Supervising Provider    Answer:   Chase Picket [id number]     *If you need refills on other medications prior to  your next appointment, please contact your pharmacy*  Follow-Up: Call back or seek an in-person evaluation if the symptoms worsen or if the condition fails to improve as anticipated.  [place] [phone]  Other Instructions  Allergic Conjunctivitis, Adult Allergic conjunctivitis is inflammation of the conjunctiva. The conjunctiva is the thin, clear membrane that covers the white part of the eye and the inner surface of the eyelid. Allergies can affect this layer of the eye. In this condition: The blood vessels in the conjunctiva swell and become irritated. The eyes become red or pink and feel itchy. There  is often a watery discharge from the eyes. Allergic conjunctivitis is not contagious. This means it cannot be spread from person to person. The condition can develop at any age and may be outgrown. What are the causes? This condition is caused by allergens. These are things that can cause an allergic reaction in some people. Common allergens include: Outdoor allergens, such as: Pollen, including pollen from grass and weeds. Mold spores. Car fumes. Pollution. Indoor allergens, such as: Dust. Smoke. Mold spores. Proteins in a pet's urine, saliva, or dander. Protein buildup on contact lenses. What increases the risk? You may be more likely to develop this condition if you have a family history of these things: Allergies. Conditions caused by being exposed to allergens, such as: Allergic rhinitis. This is an allergic reaction that affects the nose. Bronchial asthma. This condition affects the large airways in the lungs and makes breathing difficult. Atopic dermatitis (eczema). This is inflammation of the skin that is long-term (chronic). What are the signs or symptoms? Symptoms of this condition include eyes that are itchy, red, watery, or puffy. Your eyes may also: Sting or burn. Have clear fluid draining from them. Have thick mucus discharge and pain (vernal conjunctivitis). This happens in severe cases. How is this diagnosed? This condition may be diagnosed based on: Your medical history. A physical exam, including an eye exam. Tests of the fluid draining from your eyes to rule out other causes. Other tests to confirm the diagnosis, including: Testing for allergies. The skin may be pricked with a tiny needle. The pricked area is then exposed to small amounts of allergens. Testing for other eye conditions. Tests may include: Blood tests. Tissue scrapings from your eyelid. The tissue is then checked under a microscope. How is this treated? Treatment for this condition may  include: Using cold, wet cloths (cold compresses) to soothe itching and swelling. Washing your face and hair. Also, washing your clothes often to remove allergens. Using eye drops. These may be prescription or over-the-counter. You may need to try different types to see which one works best for you. Examples include: Eye drops that wash allergens out of the eyes (preservative-free artificial tears). Eye drops that block the allergic reaction (antihistamine). Eye drops that reduce swelling and irritation (anti-inflammatory). Steroid eye drops, which may be given if other treatments have not worked. Oral antihistamine medicines. These are medicines taken by mouth to lessen your allergic reaction. You may need these if eye drops do not help or are difficult to use. An air purifier at home and work. Wraparound sunglasses. This may help to decrease the amount of allergens reaching the eye. Not wearing contact lenses until symptoms improve, if the condition was caused by contact lenses. Change to daily wear disposable contact lenses, if possible. Follow these instructions at home: Eye care Apply a clean, cold compress to your eyes for 10-20 minutes, 3-4 times a  day. Do not touch or rub your eyes. Do not wear contact lenses until the inflammation is gone. Wear glasses instead. Do not wear eye makeup until the inflammation is gone. General instructions Avoid known allergens whenever possible. Take or apply over-the-counter and prescription medicines only as told by your health care provider. These include any eye drops. Drink enough fluid to keep your urine pale yellow. Keep all follow-up visits. Contact a health care provider if: Your symptoms get worse or do not get better with treatment. You have mild eye pain. You become sensitive to light. You have spots or blisters on your eyes. You have a fever. Get help right away if: You have redness, swelling, or other symptoms in only one eye. Your  vision is blurred or you have other vision changes. You have pus draining from your eyes. You have severe eye pain. Summary Allergic conjunctivitis is inflammation of the eye that is caused by allergens. It affects the clear membrane that covers the white part of the eye and the inner surface of the eyelid. It often causes eye itching, redness, and a watery discharge. Take or apply over-the-counter and prescription medicines only as told by your health care provider. These include eye drops. Do not touch or rub your eyes. Contact a health care provider if your symptoms get worse or do not get better with treatment. This information is not intended to replace advice given to you by your health care provider. Make sure you discuss any questions you have with your health care provider. Document Revised: 06/19/2021 Document Reviewed: 06/19/2021 Elsevier Patient Education  [place].    If you have been instructed to have an in-person evaluation today at a local Urgent Care facility, please use the link below. It will take you to a list of all of our available [facility], including address, phone number and hours of operation. Please do not delay care.  [facility]  If you or a family member do not have a primary care provider, use the link below to schedule a visit and establish care. When you choose a [facility] primary care physician or advanced practice provider, you gain a long-term partner in health. Find a Primary Care Provider  Learn more about [facility]'s in-office and virtual care options: [facility]

## 2022-07-25 NOTE — Progress Notes (Signed)
Virtual Visit Consent   Ann Moran, you are scheduled for a virtual visit with a [facility] provider today. Just as with appointments in the office, your consent must be obtained to participate. Your consent will be active for this visit and any virtual visit you may have with one of our providers in the next 365 days. If you have a MyChart account, a copy of this consent can be sent to you electronically.  As this is a virtual visit, video technology does not allow for your provider to perform a traditional examination. This may limit your provider's ability to fully assess your condition. If your provider identifies any concerns that need to be evaluated in person or the need to arrange testing (such as labs, EKG, etc.), we will make arrangements to do so. Although advances in technology are sophisticated, we cannot ensure that it will always work on either your end or our end. If the connection with a video visit is poor, the visit may have to be switched to a telephone visit. With either a video or telephone visit, we are not always able to ensure that we have a secure connection.  By engaging in this virtual visit, you consent to the provision of healthcare and authorize for your insurance to be billed (if applicable) for the services provided during this visit. Depending on your insurance coverage, you may receive a charge related to this service.  I need to obtain your verbal consent now. Are you willing to proceed with your visit today? CHARLANE HOGLEN has provided verbal consent on 07/25/2022 for a virtual visit (video or telephone). Mar Daring, PA-C  Date: 07/25/2022 7:21 PM  Virtual Visit via Video Note   I, Mar Daring, connected with  Ann Moran  ([id number], 1985-06-01) on 07/25/22 at  7:15 PM EDT by a video-enabled telemedicine application and verified that I am speaking with the correct person using two identifiers.  Location: Patient: Virtual Visit Location  Patient: Home Provider: Virtual Visit Location Provider: Home Office   I discussed the limitations of evaluation and management by telemedicine and the availability of in person appointments. The patient expressed understanding and agreed to proceed.    History of Present Illness: Ann Moran is a 37 y.o. who identifies as a female who was assigned female at birth, and is being seen today for eye problem. Was seen earlier today and was having itching of eye. Now has developed swelling that appears to be more a stye coming in on the left eye lid.   Problems:  Patient Active Problem List   Diagnosis Date Noted   Sinusitis, acute 01/31/2022   Migraine 01/31/2022   Anxiety and depression 01/31/2022   OSA (obstructive sleep apnea) 01/25/2022   Smoking 02/23/2021   Patellar tendinitis of both knees 01/27/2021   Pain in joint involving right ankle and foot 01/27/2021   Thoracic back pain 12/23/2020   Right elbow pain 12/23/2020   Chronic pain of right knee 12/23/2020   Ankle fracture, left 08/09/2020   Dysphagia 05/20/2019   Encounter for other preprocedural examination 05/20/2019   PCOS (polycystic ovarian syndrome) 02/24/2019   Neck pain 02/24/2019   Morbid obesity with body mass index (BMI) of 45.0 to 49.9 in adult 02/24/2019   Essential hypertension 02/24/2019   Gastroesophageal reflux disease 02/24/2019   Irregular menstrual cycle 01/14/2019   Abnormal uterine bleeding 01/14/2019   Chronic cough 01/14/2019    Allergies:  Allergies  Allergen Reactions  Prednisone Swelling    Had neck and shoulder swelling following taking   Medications:  Current Outpatient Medications:    erythromycin ophthalmic ointment, Place 1 Application into the left eye at bedtime., Disp: 3.5 g, Rfl: 0   albuterol (VENTOLIN HFA) 108 (90 Base) MCG/ACT inhaler, INHALE 2 PUFFS INTO THE LUNGS EVERY 6 HOURS AS NEEDED FOR WHEEZING OR SHORTNESS OF BREATH, Disp: 54 g, Rfl: 1   amitriptyline (ELAVIL) 25 MG  tablet, Take 1 tablet (25 mg total) by mouth at bedtime. For migraines, Disp: 40 tablet, Rfl: 2   amLODipine (NORVASC) 10 MG tablet, TAKE 1 TABLET(10 MG) BY MOUTH DAILY, Disp: 90 tablet, Rfl: 1   Blood Pressure Monitoring (BLOOD PRESSURE CUFF) MISC, Use to check blood pressure once daily., Disp: 1 each, Rfl: 0   busPIRone (BUSPAR) 15 MG tablet, TAKE 1 TABLET(15 MG) BY MOUTH TWICE DAILY, Disp: 60 tablet, Rfl: 2   fluticasone (FLONASE) 50 MCG/ACT nasal spray, Place 2 sprays into both nostrils daily., Disp: 16 g, Rfl: 6   hydrOXYzine (ATARAX) 25 MG tablet, TAKE 1 TABLET(25 MG) BY MOUTH EVERY 8 HOURS AS NEEDED FOR ANXIETY, Disp: 60 tablet, Rfl: 0   levocetirizine (XYZAL) 5 MG tablet, Take 1 tablet (5 mg total) by mouth every evening., Disp: 30 tablet, Rfl: 0   meloxicam (MOBIC) 7.5 MG tablet, Take 1 tablet (7.5 mg total) by mouth daily., Disp: 30 tablet, Rfl: 1   mupirocin ointment (BACTROBAN) 2 %, Apply 1 Application topically 3 (three) times daily., Disp: 30 g, Rfl: 0   olopatadine (PATANOL) 0.1 % ophthalmic solution, Place 1 drop into the left eye 2 (two) times daily., Disp: 5 mL, Rfl: 0   pantoprazole (PROTONIX) 40 MG tablet, TAKE 1 TABLET(40 MG) BY MOUTH DAILY, Disp: 90 tablet, Rfl: 0   pseudoephedrine (SUDAFED) 60 MG tablet, Take 1 tablet (60 mg total) by mouth every 8 (eight) hours as needed for congestion., Disp: 30 tablet, Rfl: 0   sertraline (ZOLOFT) 50 MG tablet, TAKE 1 TABLET(50 MG) BY MOUTH DAILY, Disp: 60 tablet, Rfl: 1   tiZANidine (ZANAFLEX) 4 MG tablet, Take 1 tablet (4 mg total) by mouth at bedtime., Disp: 30 tablet, Rfl: 0   traZODone (DESYREL) 100 MG tablet, Take 1 tablet (100 mg total) by mouth at bedtime as needed for sleep., Disp: 30 tablet, Rfl: 2   trimethoprim-polymyxin b (POLYTRIM) ophthalmic solution, Apply 1-2 drops into affected eye QID x 5 days., Disp: 10 mL, Rfl: 0   valsartan-hydrochlorothiazide (DIOVAN-HCT) 160-25 MG tablet, Take 1 tablet by mouth daily., Disp: 90  tablet, Rfl: 3  Observations/Objective: Patient is well-developed, well-nourished in no acute distress.  Resting comfortably at home.  Head is normocephalic, atraumatic.  No labored breathing.  Speech is clear and coherent with logical content.  Patient is alert and oriented at baseline.  Small pimple mid lash area of left upper eyelid  Assessment and Plan: 1. Hordeolum externum of left upper eyelid - erythromycin ophthalmic ointment; Place 1 Application into the left eye at bedtime.  Dispense: 3.5 g; Refill: 0  - Suspect stye - Erythromycin prescribed - Warm compresses - Good hand hygiene - Seek in person evaluation if symptoms worsen or fail to improve   Follow Up Instructions: I discussed the assessment and treatment plan with the patient. The patient was provided an opportunity to ask questions and all were answered. The patient agreed with the plan and demonstrated an understanding of the instructions.  A copy of instructions were sent to the  patient via MyChart unless otherwise noted below.    The patient was advised to call back or seek an in-person evaluation if the symptoms worsen or if the condition fails to improve as anticipated.  Time:  I spent 6 minutes with the patient via telehealth technology discussing the above problems/concerns.    Mar Daring, PA-C

## 2022-07-25 NOTE — Progress Notes (Signed)
Virtual Visit Consent   Ann Moran, you are scheduled for a virtual visit with a [facility] provider today. Just as with appointments in the office, your consent must be obtained to participate. Your consent will be active for this visit and any virtual visit you may have with one of our providers in the next 365 days. If you have a MyChart account, a copy of this consent can be sent to you electronically.  As this is a virtual visit, video technology does not allow for your provider to perform a traditional examination. This may limit your provider's ability to fully assess your condition. If your provider identifies any concerns that need to be evaluated in person or the need to arrange testing (such as labs, EKG, etc.), we will make arrangements to do so. Although advances in technology are sophisticated, we cannot ensure that it will always work on either your end or our end. If the connection with a video visit is poor, the visit may have to be switched to a telephone visit. With either a video or telephone visit, we are not always able to ensure that we have a secure connection.  By engaging in this virtual visit, you consent to the provision of healthcare and authorize for your insurance to be billed (if applicable) for the services provided during this visit. Depending on your insurance coverage, you may receive a charge related to this service.  I need to obtain your verbal consent now. Are you willing to proceed with your visit today? Ann Moran has provided verbal consent on 07/25/2022 for a virtual visit (video or telephone). Mar Daring, PA-C  Date: 07/25/2022 11:00 AM  Virtual Visit via Video Note   I, Mar Daring, connected with  Ann Moran  ([id number], [birth year]) on 07/25/22 at 10:45 AM EDT by a video-enabled telemedicine application and verified that I am speaking with the correct person using two identifiers.  Location: Patient: Virtual Visit Location  Patient: Home Provider: Virtual Visit Location Provider: Home Office   I discussed the limitations of evaluation and management by telemedicine and the availability of in person appointments. The patient expressed understanding and agreed to proceed.    History of Present Illness: Ann Moran is a 37 y.o. who identifies as a female who was assigned female at birth, and is being seen today for eye irritation.  HPI: Eye Problem  The left eye is affected. This is a recurrent problem. Episode onset: Seen Virtually on 07/17/22 and prescribed Polytrim eye drops for conjunctivitis but she never reported using them. The problem occurs constantly. The problem has been unchanged. There was no injury mechanism. The pain is mild. There is Known exposure (from children before previous treatment) to pink eye. She Does not wear contacts. Associated symptoms include eye redness and itching. Pertinent negatives include no blurred vision, double vision, fever, foreign body sensation or photophobia. Associated symptoms comments: Dry skin on lateral corner of left eye that is itchy. She has tried eye drops for the symptoms. The treatment provided mild relief.     Problems:  Patient Active Problem List   Diagnosis Date Noted   Sinusitis, acute 01/31/2022   Migraine 01/31/2022   Anxiety and depression 01/31/2022   OSA (obstructive sleep apnea) 01/25/2022   Smoking 02/23/2021   Patellar tendinitis of both knees 01/27/2021   Pain in joint involving right ankle and foot 01/27/2021   Thoracic back pain 12/23/2020   Right elbow pain 12/23/2020  Chronic pain of right knee 12/23/2020   Ankle fracture, left 08/09/2020   Dysphagia 05/20/2019   Encounter for other preprocedural examination 05/20/2019   PCOS (polycystic ovarian syndrome) 02/24/2019   Neck pain 02/24/2019   Morbid obesity with body mass index (BMI) of 45.0 to 49.9 in adult 02/24/2019   Essential hypertension 02/24/2019   Gastroesophageal reflux  disease 02/24/2019   Irregular menstrual cycle 01/14/2019   Abnormal uterine bleeding 01/14/2019   Chronic cough 01/14/2019    Allergies:  Allergies  Allergen Reactions   Prednisone Swelling    Had neck and shoulder swelling following taking   Medications:  Current Outpatient Medications:    olopatadine (PATANOL) 0.1 % ophthalmic solution, Place 1 drop into the left eye 2 (two) times daily., Disp: 5 mL, Rfl: 0   albuterol (VENTOLIN HFA) 108 (90 Base) MCG/ACT inhaler, INHALE 2 PUFFS INTO THE LUNGS EVERY 6 HOURS AS NEEDED FOR WHEEZING OR SHORTNESS OF BREATH, Disp: 54 g, Rfl: 1   amitriptyline (ELAVIL) 25 MG tablet, Take 1 tablet (25 mg total) by mouth at bedtime. For migraines, Disp: 40 tablet, Rfl: 2   amLODipine (NORVASC) 10 MG tablet, TAKE 1 TABLET(10 MG) BY MOUTH DAILY, Disp: 90 tablet, Rfl: 1   Blood Pressure Monitoring (BLOOD PRESSURE CUFF) MISC, Use to check blood pressure once daily., Disp: 1 each, Rfl: 0   busPIRone (BUSPAR) 15 MG tablet, TAKE 1 TABLET(15 MG) BY MOUTH TWICE DAILY, Disp: 60 tablet, Rfl: 2   fluticasone (FLONASE) 50 MCG/ACT nasal spray, Place 2 sprays into both nostrils daily., Disp: 16 g, Rfl: 6   hydrOXYzine (ATARAX) 25 MG tablet, TAKE 1 TABLET(25 MG) BY MOUTH EVERY 8 HOURS AS NEEDED FOR ANXIETY, Disp: 60 tablet, Rfl: 0   levocetirizine (XYZAL) 5 MG tablet, Take 1 tablet (5 mg total) by mouth every evening., Disp: 30 tablet, Rfl: 0   meloxicam (MOBIC) 7.5 MG tablet, Take 1 tablet (7.5 mg total) by mouth daily., Disp: 30 tablet, Rfl: 1   mupirocin ointment (BACTROBAN) 2 %, Apply 1 Application topically 3 (three) times daily., Disp: 30 g, Rfl: 0   pantoprazole (PROTONIX) 40 MG tablet, TAKE 1 TABLET(40 MG) BY MOUTH DAILY, Disp: 90 tablet, Rfl: 0   pseudoephedrine (SUDAFED) 60 MG tablet, Take 1 tablet (60 mg total) by mouth every 8 (eight) hours as needed for congestion., Disp: 30 tablet, Rfl: 0   sertraline (ZOLOFT) 50 MG tablet, TAKE 1 TABLET(50 MG) BY MOUTH DAILY,  Disp: 60 tablet, Rfl: 1   tiZANidine (ZANAFLEX) 4 MG tablet, Take 1 tablet (4 mg total) by mouth at bedtime., Disp: 30 tablet, Rfl: 0   traZODone (DESYREL) 100 MG tablet, Take 1 tablet (100 mg total) by mouth at bedtime as needed for sleep., Disp: 30 tablet, Rfl: 2   trimethoprim-polymyxin b (POLYTRIM) ophthalmic solution, Apply 1-2 drops into affected eye QID x 5 days., Disp: 10 mL, Rfl: 0   valsartan-hydrochlorothiazide (DIOVAN-HCT) 160-25 MG tablet, Take 1 tablet by mouth daily., Disp: 90 tablet, Rfl: 3  Observations/Objective: Patient is well-developed, well-nourished in no acute distress.  Resting comfortably at home.  Head is normocephalic, atraumatic.  No labored breathing.  Speech is clear and coherent with logical content.  Patient is alert and oriented at baseline.    Assessment and Plan: 1. Allergic conjunctivitis of left eye - olopatadine (PATANOL) 0.1 % ophthalmic solution; Place 1 drop into the left eye 2 (two) times daily.  Dispense: 5 mL; Refill: 0  - Suspect allergic conjunctivitis - Olopatadine prescribed -  Continue saline eye rinses and/or wetting eye drops - Seek in person evaluation if worsens or fails to improve  Follow Up Instructions: I discussed the assessment and treatment plan with the patient. The patient was provided an opportunity to ask questions and all were answered. The patient agreed with the plan and demonstrated an understanding of the instructions.  A copy of instructions were sent to the patient via MyChart unless otherwise noted below.    The patient was advised to call back or seek an in-person evaluation if the symptoms worsen or if the condition fails to improve as anticipated.  Time:  I spent 10 minutes with the patient via telehealth technology discussing the above problems/concerns.    Mar Daring, PA-C

## 2022-07-26 ENCOUNTER — Ambulatory Visit: Payer: Medicaid Other

## 2022-07-27 ENCOUNTER — Other Ambulatory Visit: Payer: Self-pay | Admitting: Family Medicine

## 2022-07-27 ENCOUNTER — Ambulatory Visit
Admission: EM | Admit: 2022-07-27 | Discharge: 2022-07-27 | Disposition: A | Discharge status: Home / Self Care | Payer: Medicaid Other

## 2022-07-27 ENCOUNTER — Inpatient Hospital Stay: Admission: RE | Admit: 2022-07-27 | Payer: Medicaid Other | Source: Ambulatory Visit

## 2022-07-27 DIAGNOSIS — H00014 Hordeolum externum left upper eyelid: Secondary | ICD-10-CM | POA: Diagnosis not present

## 2022-07-27 NOTE — ED Triage Notes (Signed)
Patient c/o left eye discomfort and swelling. She has been taking prescribed eye ointment (erythromycin).

## 2022-07-27 NOTE — Telephone Encounter (Signed)
Requested Prescriptions  Pending Prescriptions Disp Refills   pantoprazole (PROTONIX) 40 MG tablet [Pharmacy Med Name: PANTOPRAZOLE 40MG  TABLETS] 90 tablet 1    Sig: TAKE 1 TABLET(40 MG) BY MOUTH DAILY     Gastroenterology: Proton Pump Inhibitors Passed - 07/27/2022  3:59 AM      Passed - Valid encounter within last 12 months    Recent Outpatient Visits           4 months ago Abnormal EKG   [facility] Hoy Register, MD   5 months ago Patient left before evaluation by physician   [facility] Marcine Matar, MD   5 months ago Essential hypertension   [facility] Storm Frisk, MD   6 months ago Essential hypertension   [facility] Hoy Register, MD   7 months ago Essential hypertension   [facility] Drucilla Chalet, RPH-CPP       Future Appointments             Today [facility])

## 2022-07-27 NOTE — ED Provider Notes (Signed)
[facility]    CSN: [id number] Arrival date & time: 07/27/22  [id number]      History   Chief Complaint Chief Complaint  Patient presents with   Eye Problem    HPI Fransico HimMelanie E Moran is a 37 y.o. female presents for evaluation of a stye.  Patient did a virtual visit on 4/3 for allergic conjunctivitis and was placed on antihistamine eyedrops.  On 4/4 she did another visit and was diagnosed with a stye to the left upper eyelid.  She was prescribed erythromycin ointment nightly which she started yesterday.  She reports no improvement in symptoms and continues to have some swelling of the upper eyelid.  She has been doing warm and cool compresses and has had some drainage.  Denies any redness warmth or swelling of the periorbital area.  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  No other concerns at this time.   Eye Problem   Past Medical History:  Diagnosis Date   Allergy    Anemia    Anxiety    Chlamydia    Depression    hx of meds, none currently- doing ok   Diabetes mellitus without complication    GERD (gastroesophageal reflux disease)    Infection due to trichomonas    MRSA (methicillin resistant staph aureus) culture positive    Dec 2012   Obesity    Pregnancy induced hypertension    Urinary tract infection     Patient Active Problem List   Diagnosis Date Noted   Sinusitis, acute 01/31/2022   Migraine 01/31/2022   Anxiety and depression 01/31/2022   OSA (obstructive sleep apnea) 01/25/2022   Smoking 02/23/2021   Patellar tendinitis of both knees 01/27/2021   Pain in joint involving right ankle and foot 01/27/2021   Thoracic back pain 12/23/2020   Right elbow pain 12/23/2020   Chronic pain of right knee 12/23/2020   Ankle fracture, left 08/09/2020   Dysphagia 05/20/2019   Encounter for other preprocedural examination 05/20/2019   PCOS (polycystic ovarian syndrome) 02/24/2019   Neck pain 02/24/2019   Morbid obesity with body mass index (BMI) of 45.0 to 49.9 in  adult 02/24/2019   Essential hypertension 02/24/2019   Gastroesophageal reflux disease 02/24/2019   Irregular menstrual cycle 01/14/2019   Abnormal uterine bleeding 01/14/2019   Chronic cough 01/14/2019    Past Surgical History:  Procedure Laterality Date   CHOLECYSTECTOMY     ECTOPIC PREGNANCY SURGERY     ESOPHAGOGASTRODUODENOSCOPY  05/2019    OB History     Gravida  2   Para  1   Term  0   Preterm  1   AB  1   Living  1      SAB      IAB      Ectopic  1   Multiple      Live Births  1            Home Medications    Prior to Admission medications   Medication Sig Start Date End Date Taking? Authorizing Provider  albuterol (VENTOLIN HFA) 108 (90 Base) MCG/ACT inhaler INHALE 2 PUFFS INTO THE LUNGS EVERY 6 HOURS AS NEEDED FOR WHEEZING OR SHORTNESS OF BREATH 04/09/22   Hoy RegisterNewlin, Enobong, MD  amitriptyline (ELAVIL) 25 MG tablet Take 1 tablet (25 mg total) by mouth at bedtime. For migraines 01/31/22   Storm FriskWright, Patrick E, MD  amLODipine (NORVASC) 10 MG tablet TAKE 1 TABLET(10 MG) BY MOUTH DAILY 01/31/22  Storm FriskWright, Patrick E, MD  Blood Pressure Monitoring (BLOOD PRESSURE CUFF) MISC Use to check blood pressure once daily. 08/29/21   Hoy RegisterNewlin, Enobong, MD  busPIRone (BUSPAR) 15 MG tablet TAKE 1 TABLET(15 MG) BY MOUTH TWICE DAILY 04/24/22   Storm FriskWright, Patrick E, MD  erythromycin ophthalmic ointment Place 1 Application into the left eye at bedtime. 07/25/22   Margaretann LovelessBurnette, Jennifer M, PA-C  fluticasone (FLONASE) 50 MCG/ACT nasal spray Place 2 sprays into both nostrils daily. 01/31/22   Storm FriskWright, Patrick E, MD  hydrOXYzine (ATARAX) 25 MG tablet TAKE 1 TABLET(25 MG) BY MOUTH EVERY 8 HOURS AS NEEDED FOR ANXIETY 01/31/22   Storm FriskWright, Patrick E, MD  levocetirizine (XYZAL) 5 MG tablet Take 1 tablet (5 mg total) by mouth every evening. 07/17/22   Waldon MerlMartin, William C, PA-C  meloxicam (MOBIC) 7.5 MG tablet Take 1 tablet (7.5 mg total) by mouth daily. 01/03/22   Hoy RegisterNewlin, Enobong, MD  mupirocin ointment  (BACTROBAN) 2 % Apply 1 Application topically 3 (three) times daily. 01/29/22   Wallis BambergMani, Mario, PA-C  olopatadine (PATANOL) 0.1 % ophthalmic solution Place 1 drop into the left eye 2 (two) times daily. 07/25/22   Margaretann LovelessBurnette, Jennifer M, PA-C  pantoprazole (PROTONIX) 40 MG tablet TAKE 1 TABLET(40 MG) BY MOUTH DAILY 07/27/22   Hoy RegisterNewlin, Enobong, MD  pseudoephedrine (SUDAFED) 60 MG tablet Take 1 tablet (60 mg total) by mouth every 8 (eight) hours as needed for congestion. 03/12/22   Wallis BambergMani, Mario, PA-C  sertraline (ZOLOFT) 50 MG tablet TAKE 1 TABLET(50 MG) BY MOUTH DAILY 01/31/22   Storm FriskWright, Patrick E, MD  tiZANidine (ZANAFLEX) 4 MG tablet Take 1 tablet (4 mg total) by mouth at bedtime. 01/16/22   Wallis BambergMani, Mario, PA-C  traZODone (DESYREL) 100 MG tablet Take 1 tablet (100 mg total) by mouth at bedtime as needed for sleep. 01/15/22   Hoy RegisterNewlin, Enobong, MD  trimethoprim-polymyxin b (POLYTRIM) ophthalmic solution Apply 1-2 drops into affected eye QID x 5 days. 07/17/22   Waldon MerlMartin, William C, PA-C  valsartan-hydrochlorothiazide (DIOVAN-HCT) 160-25 MG tablet Take 1 tablet by mouth daily. 01/31/22   Storm FriskWright, Patrick E, MD  medroxyPROGESTERone (PROVERA) 5 MG tablet Take 2 tablets (10 mg total) by mouth daily. Patient not taking: Reported on 07/20/2018 06/30/18 12/06/18  Geoffery Lyonselo, Douglas, MD    Family History Family History  Problem Relation Age of Onset   Hypertension Mother    Diabetes Mother    Asthma Father    Hypertension Father    Stroke Maternal Grandmother    Diabetes Paternal Grandmother    Hypertension Paternal Grandmother    Colon cancer Paternal Grandmother    Colon cancer Paternal Aunt    Esophageal cancer Neg Hx    Stomach cancer Neg Hx    Rectal cancer Neg Hx     Social History Social History   Tobacco Use   Smoking status: Every Day    Packs/day: 0.50    Years: 9.00    Additional pack years: 0.00    Total pack years: 4.50    Types: Cigarettes    Passive exposure: Past   Smokeless tobacco: Never   Tobacco  comments:    Pt currently smoking 01/25/22 PAP  Vaping Use   Vaping Use: Former   Quit date: 08/18/2015  Substance Use Topics   Alcohol use: Not Currently   Drug use: Yes    Types: Marijuana    Comment: Smokes daily to treat migraines     Allergies   Prednisone   Review of Systems Review of  Systems  Eyes:        Stye to left upper eyelid     Physical Exam Triage Vital Signs ED Triage Vitals [07/27/22 1909]  Enc Vitals Group     BP (!) 168/129     Pulse Rate 93     Resp 18     Temp 97.8 F (36.6 C)     Temp Source Oral     SpO2 98 %     Weight      Height      Head Circumference      Peak Flow      Pain Score 5     Pain Loc      Pain Edu?      Excl. in GC?    No data found.  Updated Vital Signs BP (!) 163/114   Pulse 93   Temp 97.8 F (36.6 C) (Oral)   Resp 18   SpO2 98%   Visual Acuity Right Eye Distance:   Left Eye Distance:   Bilateral Distance:    Right Eye Near:   Left Eye Near:    Bilateral Near:     Physical Exam Vitals and nursing note reviewed.  Constitutional:      Appearance: Normal appearance. She is obese.  HENT:     Head: Normocephalic and atraumatic.  Eyes:     Pupils: Pupils are equal, round, and reactive to light.   Cardiovascular:     Rate and Rhythm: Normal rate.  Pulmonary:     Effort: Pulmonary effort is normal.  Neurological:     General: No focal deficit present.     Mental Status: She is alert and oriented to person, place, and time.  Psychiatric:        Mood and Affect: Mood normal.        Behavior: Behavior normal.      UC Treatments / Results  Labs (all labs ordered are listed, but only abnormal results are displayed) Labs Reviewed - No data to display  EKG   Radiology No results found.  Procedures Procedures (including critical care time)  Medications Ordered in UC Medications - No data to display  Initial Impression / Assessment and Plan / UC Course  I have reviewed the triage vital  signs and the nursing notes.  Pertinent labs & imaging results that were available during my care of the patient were reviewed by me and considered in my medical decision making (see chart for details).     Reviewed exam and symptoms with patient.  Advised to increase erythromycin Armond to 3 times a day and continue warm compresses If no improvement patient can contact clinic and we will consider oral antibiotics Blood pressure elevated on intake and recheck.  She denies chest pain, headache, shortness of breath, dizziness.  Advised to follow-up with PCP regarding this. Follow-up with PCP in 2 to 3 days for recheck ER precautions reviewed and patient verbalized understanding Final Clinical Impressions(s) / UC Diagnoses   Final diagnoses:  Hordeolum externum of left upper eyelid     Discharge Instructions      Use erythromycin ointment 3 times a day to the upper eyelid.  Continue warm compresses as needed Follow-up with your PCP if there is no improvement in symptoms Go to the emergency room for any worsening symptoms    ED Prescriptions   None    PDMP not reviewed this encounter.   Radford Pax, NP 07/27/22 [phone]

## 2022-07-27 NOTE — Discharge Instructions (Addendum)
Use erythromycin ointment 3 times a day to the upper eyelid.  Continue warm compresses as needed Follow-up with your PCP if there is no improvement in symptoms Go to the emergency room for any worsening symptoms

## 2022-08-09 ENCOUNTER — Other Ambulatory Visit: Payer: Self-pay | Admitting: Family Medicine

## 2022-08-09 DIAGNOSIS — I10 Essential (primary) hypertension: Secondary | ICD-10-CM

## 2022-08-30 ENCOUNTER — Telehealth: Payer: Medicaid Other | Admitting: Nurse Practitioner

## 2022-08-30 DIAGNOSIS — W57XXXA Bitten or stung by nonvenomous insect and other nonvenomous arthropods, initial encounter: Secondary | ICD-10-CM | POA: Diagnosis not present

## 2022-08-30 DIAGNOSIS — Z91038 Other insect allergy status: Secondary | ICD-10-CM | POA: Diagnosis not present

## 2022-08-30 MED ORDER — SULFAMETHOXAZOLE-TRIMETHOPRIM 800-160 MG PO TABS: 1.0000 | ORAL_TABLET | Freq: Two times a day (BID) | ORAL | 0 refills | Status: AC

## 2022-08-30 MED ORDER — MUPIROCIN 2 % EX OINT: 1.0000 | TOPICAL_OINTMENT | Freq: Two times a day (BID) | CUTANEOUS | 0 refills | Status: DC

## 2022-08-30 MED ORDER — CETIRIZINE HCL 10 MG PO TABS: 10.0000 mg | ORAL_TABLET | Freq: Every day | ORAL | 0 refills | Status: AC

## 2022-08-30 MED ORDER — IBUPROFEN 600 MG PO TABS: 600.0000 mg | ORAL_TABLET | Freq: Three times a day (TID) | ORAL | 0 refills | Status: DC | PRN

## 2022-08-30 MED ORDER — CHLORHEXIDINE GLUCONATE 4 % EX SOLN: Freq: Every day | CUTANEOUS | 1 refills | Status: DC | PRN

## 2022-08-30 NOTE — Progress Notes (Signed)
Virtual Visit Consent   [name], you are scheduled for a virtual visit with a [facility] provider today. Just as with appointments in the office, your consent must be obtained to participate. Your consent will be active for this visit and any virtual visit you may have with one of our providers in the next 365 days. If you have a MyChart account, a copy of this consent can be sent to you electronically.  As this is a virtual visit, video technology does not allow for your provider to perform a traditional examination. This may limit your provider's ability to fully assess your condition. If your provider identifies any concerns that need to be evaluated in person or the need to arrange testing (such as labs, EKG, etc.), we will make arrangements to do so. Although advances in technology are sophisticated, we cannot ensure that it will always work on either your end or our end. If the connection with a video visit is poor, the visit may have to be switched to a telephone visit. With either a video or telephone visit, we are not always able to ensure that we have a secure connection.  By engaging in this virtual visit, you consent to the provision of healthcare and authorize for your insurance to be billed (if applicable) for the services provided during this visit. Depending on your insurance coverage, you may receive a charge related to this service.  I need to obtain your verbal consent now. Are you willing to proceed with your visit today? MAILE BREVIK has provided verbal consent on 08/30/2022 for a virtual visit (video or telephone). Viviano Simas, FNP  Date: 08/30/2022 5:02 PM  Virtual Visit via Video Note   I, Viviano Simas, connected with  Ann Moran  ([id number], [birth year]) on 08/30/22 at  5:00 PM EDT by a video-enabled telemedicine application and verified that I am speaking with the correct person using two identifiers.  Location: Patient: Virtual Visit Location Patient:  Home Provider: Virtual Visit Location Provider: Home Office   I discussed the limitations of evaluation and management by telemedicine and the availability of in person appointments. The patient expressed understanding and agreed to proceed.    History of Present Illness: Ann Moran is a 37 y.o. who identifies as a female who was assigned female at birth, and is being seen today for arm pain.  Started one week ago after she was at a water park  She got bit by what she thought was a mosquito at that time  She was at an outdoor water water Today her arm is aching from the elbow down   There is some swelling around the bite  The area is not warm to touch  Does feel harder  Denies discoloration   Denies fever or chills   This is on her right arm  The bite is right below her elbow   She has had a localized reaction to a sting in the past   The area is more painful than itchy  She has been washing it  Has been putting benadryl cream on it  Cleaning with witch hazel and peroxide   She has had MRSA in the past    Problems:  Patient Active Problem List   Diagnosis Date Noted   Sinusitis, acute 01/31/2022   Migraine 01/31/2022   Anxiety and depression 01/31/2022   OSA (obstructive sleep apnea) 01/25/2022   Smoking 02/23/2021   Patellar tendinitis of both knees 01/27/2021  Pain in joint involving right ankle and foot 01/27/2021   Thoracic back pain 12/23/2020   Right elbow pain 12/23/2020   Chronic pain of right knee 12/23/2020   Ankle fracture, left 08/09/2020   Dysphagia 05/20/2019   Encounter for other preprocedural examination 05/20/2019   PCOS (polycystic ovarian syndrome) 02/24/2019   Neck pain 02/24/2019   Morbid obesity with body mass index (BMI) of 45.0 to 49.9 in adult [facility]) 02/24/2019   Essential hypertension 02/24/2019   Gastroesophageal reflux disease 02/24/2019   Irregular menstrual cycle 01/14/2019   Abnormal uterine bleeding 01/14/2019   Chronic  cough 01/14/2019    Allergies:  Allergies  Allergen Reactions   Prednisone Swelling    Had neck and shoulder swelling following taking   Medications:   Current Outpatient Medications:    cetirizine (ZYRTEC ALLERGY) 10 MG tablet, Take 1 tablet (10 mg total) by mouth at bedtime for 15 days., Disp: 15 tablet, Rfl: 0   chlorhexidine (HIBICLENS) 4 % external liquid, Apply topically daily as needed (wash area in shower once daily)., Disp: 236 mL, Rfl: 1   ibuprofen (ADVIL) 600 MG tablet, Take 1 tablet (600 mg total) by mouth every 8 (eight) hours as needed., Disp: 30 tablet, Rfl: 0   mupirocin ointment (BACTROBAN) 2 %, Apply 1 Application topically 2 (two) times daily., Disp: 22 g, Rfl: 0   sulfamethoxazole-trimethoprim (BACTRIM DS) 800-160 MG tablet, Take 1 tablet by mouth 2 (two) times daily for 10 days., Disp: 20 tablet, Rfl: 0   albuterol (VENTOLIN HFA) 108 (90 Base) MCG/ACT inhaler, INHALE 2 PUFFS INTO THE LUNGS EVERY 6 HOURS AS NEEDED FOR WHEEZING OR SHORTNESS OF BREATH, Disp: 54 g, Rfl: 1   amitriptyline (ELAVIL) 25 MG tablet, Take 1 tablet (25 mg total) by mouth at bedtime. For migraines, Disp: 40 tablet, Rfl: 2   amLODipine (NORVASC) 10 MG tablet, TAKE 1 TABLET(10 MG) BY MOUTH DAILY, Disp: 90 tablet, Rfl: 1   Blood Pressure Monitoring (BLOOD PRESSURE CUFF) MISC, Use to check blood pressure once daily., Disp: 1 each, Rfl: 0   busPIRone (BUSPAR) 15 MG tablet, TAKE 1 TABLET(15 MG) BY MOUTH TWICE DAILY, Disp: 60 tablet, Rfl: 2   erythromycin ophthalmic ointment, Place 1 Application into the left eye at bedtime., Disp: 3.5 g, Rfl: 0   fluticasone (FLONASE) 50 MCG/ACT nasal spray, Place 2 sprays into both nostrils daily., Disp: 16 g, Rfl: 6   hydrOXYzine (ATARAX) 25 MG tablet, TAKE 1 TABLET(25 MG) BY MOUTH EVERY 8 HOURS AS NEEDED FOR ANXIETY, Disp: 60 tablet, Rfl: 0   meloxicam (MOBIC) 7.5 MG tablet, Take 1 tablet (7.5 mg total) by mouth daily., Disp: 30 tablet, Rfl: 1   olopatadine (PATANOL)  0.1 % ophthalmic solution, Place 1 drop into the left eye 2 (two) times daily., Disp: 5 mL, Rfl: 0   pantoprazole (PROTONIX) 40 MG tablet, TAKE 1 TABLET(40 MG) BY MOUTH DAILY, Disp: 90 tablet, Rfl: 1   pseudoephedrine (SUDAFED) 60 MG tablet, Take 1 tablet (60 mg total) by mouth every 8 (eight) hours as needed for congestion., Disp: 30 tablet, Rfl: 0   sertraline (ZOLOFT) 50 MG tablet, TAKE 1 TABLET(50 MG) BY MOUTH DAILY, Disp: 60 tablet, Rfl: 1   tiZANidine (ZANAFLEX) 4 MG tablet, Take 1 tablet (4 mg total) by mouth at bedtime., Disp: 30 tablet, Rfl: 0   traZODone (DESYREL) 100 MG tablet, Take 1 tablet (100 mg total) by mouth at bedtime as needed for sleep., Disp: 30 tablet, Rfl: 2  trimethoprim-polymyxin b (POLYTRIM) ophthalmic solution, Apply 1-2 drops into affected eye QID x 5 days., Disp: 10 mL, Rfl: 0   valsartan-hydrochlorothiazide (DIOVAN-HCT) 160-25 MG tablet, Take 1 tablet by mouth daily., Disp: 90 tablet, Rfl: 3  Observations/Objective: Patient is well-developed, well-nourished in no acute distress.  Resting comfortably  at home.  Head is normocephalic, atraumatic.  No labored breathing.  Speech is clear and coherent with logical content.  Patient is alert and oriented at baseline.    Assessment and Plan: 1. Bug bite with infection, initial encounter  - mupirocin ointment (BACTROBAN) 2 %; Apply 1 Application topically 2 (two) times daily.  Dispense: 22 g; Refill: 0 - chlorhexidine (HIBICLENS) 4 % external liquid; Apply topically daily as needed (wash area in shower once daily).  Dispense: 236 mL; Refill: 1 - sulfamethoxazole-trimethoprim (BACTRIM DS) 800-160 MG tablet; Take 1 tablet by mouth 2 (two) times daily for 10 days.  Dispense: 20 tablet; Refill: 0  2. Allergic reaction to insect bite  - cetirizine (ZYRTEC ALLERGY) 10 MG tablet; Take 1 tablet (10 mg total) by mouth at bedtime for 15 days.  Dispense: 15 tablet; Refill: 0 - ibuprofen (ADVIL) 600 MG tablet; Take 1 tablet  (600 mg total) by mouth every 8 (eight) hours as needed.  Dispense: 30 tablet; Refill: 0     Follow Up Instructions: I discussed the assessment and treatment plan with the patient. The patient was provided an opportunity to ask questions and all were answered. The patient agreed with the plan and demonstrated an understanding of the instructions.  A copy of instructions were sent to the patient via MyChart unless otherwise noted below.    The patient was advised to call back or seek an in-person evaluation if the symptoms worsen or if the condition fails to improve as anticipated.  Time:  I spent 15 minutes with the patient via telehealth technology discussing the above problems/concerns.    Viviano Simas, FNP

## 2022-09-05 ENCOUNTER — Ambulatory Visit: Payer: Medicaid Other | Admitting: Sports Medicine

## 2022-10-03 ENCOUNTER — Telehealth: Payer: Medicaid Other | Admitting: Nurse Practitioner

## 2022-10-03 DIAGNOSIS — K047 Periapical abscess without sinus: Secondary | ICD-10-CM | POA: Diagnosis not present

## 2022-10-03 MED ORDER — PENICILLIN V POTASSIUM 500 MG PO TABS: 500.0000 mg | ORAL_TABLET | Freq: Three times a day (TID) | ORAL | 0 refills | Status: AC

## 2022-10-03 NOTE — Progress Notes (Signed)
E-Visit for Dental Pain  We are sorry that you are not feeling well.  Here is how we plan to help!  Based on what you have shared with me in the questionnaire, it sounds like you have an infection under one of your teeth   Pen VK 500mg 3 times a day for 7 days  It is imperative that you see a dentist within 10 days of this eVisit to determine the cause of the dental pain and be sure it is adequately treated  A toothache or tooth pain is caused when the nerve in the root of a tooth or surrounding a tooth is irritated. Dental (tooth) infection, decay, injury, or loss of a tooth are the most common causes of dental pain. Pain may also occur after an extraction (tooth is pulled out). Pain sometimes originates from other areas and radiates to the jaw, thus appearing to be tooth pain.Bacteria growing inside your mouth can contribute to gum disease and dental decay, both of which can cause pain. A toothache occurs from inflammation of the central portion of the tooth called pulp. The pulp contains nerve endings that are very sensitive to pain. Inflammation to the pulp or pulpitis may be caused by dental cavities, trauma, and infection.    HOME CARE:   For toothaches: Over-the-counter pain medications such as acetaminophen or ibuprofen may be used. Take these as directed on the package while you arrange for a dental appointment. Avoid very cold or hot foods, because they may make the pain worse. You may get relief from biting on a cotton ball soaked in oil of cloves. You can get oil of cloves at most drug stores.  For jaw pain:  Aspirin may be helpful for problems in the joint of the jaw in adults. If pain happens every time you open your mouth widely, the temporomandibular joint (TMJ) may be the source of the pain. Yawning or taking a large bite of food may worsen the pain. An appointment with your doctor or dentist will help you find the cause.     GET HELP RIGHT AWAY IF:  You have a high fever  or chills If you have had a recent head or face injury and develop headache, light headedness, nausea, vomiting, or other symptoms that concern you after an injury to your face or mouth, you could have a more serious injury in addition to your dental injury. A facial rash associated with a toothache: This condition may improve with medication. Contact your doctor for them to decide what is appropriate. Any jaw pain occurring with chest pain: Although jaw pain is most commonly caused by dental disease, it is sometimes referred pain from other areas. People with heart disease, especially people who have had stents placed, people with diabetes, or those who have had heart surgery may have jaw pain as a symptom of heart attack or angina. If your jaw or tooth pain is associated with lightheadedness, sweating, or shortness of breath, you should see a doctor as soon as possible. Trouble swallowing or excessive pain or bleeding from gums: If you have a history of a weakened immune system, diabetes, or steroid use, you may be more susceptible to infections. Infections can often be more severe and extensive or caused by unusual organisms. Dental and gum infections in people with these conditions may require more aggressive treatment. An abscess may need draining or IV antibiotics, for example.  MAKE SURE YOU   Understand these instructions. Will watch your condition. Will   get help right away if you are not doing well or get worse.  Thank you for choosing an e-visit.  Your e-visit answers were reviewed by a board certified advanced clinical practitioner to complete your personal care plan. Depending upon the condition, your plan could have included both over the counter or prescription medications.  Please review your pharmacy choice. Make sure the pharmacy is open so you can pick up prescription now. If there is a problem, you may contact your provider through MyChart messaging and have the prescription routed  to another pharmacy.  Your safety is important to us. If you have drug allergies check your prescription carefully.   For the next 24 hours you can use MyChart to ask questions about today's visit, request a non-urgent call back, or ask for a work or school excuse. You will get an email in the next two days asking about your experience. I hope that your e-visit has been valuable and will speed your recovery.   Meds ordered this encounter  Medications   penicillin v potassium (VEETID) 500 MG tablet    Sig: Take 1 tablet (500 mg total) by mouth 3 (three) times daily for 7 days.    Dispense:  21 tablet    Refill:  0    I spent approximately 5 minutes reviewing the patient's history, current symptoms and coordinating their care today.   

## 2022-10-12 ENCOUNTER — Other Ambulatory Visit: Payer: Self-pay | Admitting: Family Medicine

## 2022-10-12 ENCOUNTER — Other Ambulatory Visit: Payer: Self-pay | Admitting: Critical Care Medicine

## 2022-10-12 DIAGNOSIS — I10 Essential (primary) hypertension: Secondary | ICD-10-CM

## 2022-10-12 NOTE — Telephone Encounter (Signed)
Unable to refill per protocol, Rx request was refilled 10/12/22.E-Prescribing Status: Receipt confirmed by pharmacy (10/12/2022 10:59 AM EDT).  Requested Prescriptions  Pending Prescriptions Disp Refills   amLODipine (NORVASC) 10 MG tablet [Pharmacy Med Name: AMLODIPINE BESYLATE 10MG  TABLETS] 90 tablet     Sig: TAKE 1 TABLET(10 MG) BY MOUTH DAILY. PLEASE MAKE PCP APPOINTMENT FOR MORE REFILLS     Cardiovascular: Calcium Channel Blockers 2 Failed - 10/12/2022 11:00 AM      Failed - Last BP in normal range    BP Readings from Last 1 Encounters:  07/27/22 (!) 163/114         Failed - Valid encounter within last 6 months    Recent Outpatient Visits           7 months ago Abnormal EKG   [facility] Hoy Register, MD   8 months ago Patient left before evaluation by physician   [facility] Marcine Matar, MD   8 months ago Essential hypertension   [facility] Storm Frisk, MD   9 months ago Essential hypertension    [facility] Hoy Register, MD   10 months ago Essential hypertension   [facility] [place] L, RPH-CPP              Passed - Last Heart Rate in normal range    Pulse Readings from Last 1 Encounters:  07/27/22 93

## 2022-10-15 ENCOUNTER — Telehealth: Payer: Medicaid Other | Admitting: Family Medicine

## 2022-10-15 DIAGNOSIS — K0889 Other specified disorders of teeth and supporting structures: Secondary | ICD-10-CM

## 2022-10-15 NOTE — Patient Instructions (Signed)
[name], thank you for joining Freddy Finner, NP for today's virtual visit.  While this provider is not your primary care provider (PCP), if your PCP is located in our provider database this encounter information will be shared with them immediately following your visit.   A [facility] MyChart account gives you access to today's visit and all your visits, tests, and labs performed at [facility] " click here if you don't have a  MyChart account or go to [email or web address]  Consent: (Patient) Ann Moran provided verbal consent for this virtual visit at the beginning of the encounter.  Current Medications:  Current Outpatient Medications:    albuterol (VENTOLIN HFA) 108 (90 Base) MCG/ACT inhaler, INHALE 2 PUFFS INTO THE LUNGS EVERY 6 HOURS AS NEEDED FOR WHEEZING OR SHORTNESS OF BREATH, Disp: 54 g, Rfl: 1   amitriptyline (ELAVIL) 25 MG tablet, Take 1 tablet (25 mg total) by mouth at bedtime. For migraines, Disp: 40 tablet, Rfl: 2   amLODipine (NORVASC) 10 MG tablet, TAKE 1 TABLET(10 MG) BY MOUTH DAILY. Please make PCP appointment for more refills., Disp: 30 tablet, Rfl: 0   Blood Pressure Monitoring (BLOOD PRESSURE CUFF) MISC, Use to check blood pressure once daily., Disp: 1 each, Rfl: 0   busPIRone (BUSPAR) 15 MG tablet, TAKE 1 TABLET(15 MG) BY MOUTH TWICE DAILY, Disp: 60 tablet, Rfl: 2   cetirizine (ZYRTEC ALLERGY) 10 MG tablet, Take 1 tablet (10 mg total) by mouth at bedtime for 15 days., Disp: 15 tablet, Rfl: 0   chlorhexidine (HIBICLENS) 4 % external liquid, Apply topically daily as needed (wash area in shower once daily)., Disp: 236 mL, Rfl: 1   erythromycin ophthalmic ointment, Place 1 Application into the left eye at bedtime., Disp: 3.5 g, Rfl: 0   fluticasone (FLONASE) 50 MCG/ACT nasal spray, Place 2 sprays into both nostrils daily., Disp: 16 g, Rfl: 6   hydrOXYzine (ATARAX) 25 MG tablet, TAKE 1 TABLET(25 MG) BY MOUTH EVERY 8 HOURS AS NEEDED FOR  ANXIETY, Disp: 60 tablet, Rfl: 0   ibuprofen (ADVIL) 600 MG tablet, Take 1 tablet (600 mg total) by mouth every 8 (eight) hours as needed., Disp: 30 tablet, Rfl: 0   meloxicam (MOBIC) 7.5 MG tablet, Take 1 tablet (7.5 mg total) by mouth daily., Disp: 30 tablet, Rfl: 1   mupirocin ointment (BACTROBAN) 2 %, Apply 1 Application topically 2 (two) times daily., Disp: 22 g, Rfl: 0   olopatadine (PATANOL) 0.1 % ophthalmic solution, Place 1 drop into the left eye 2 (two) times daily., Disp: 5 mL, Rfl: 0   pantoprazole (PROTONIX) 40 MG tablet, TAKE 1 TABLET(40 MG) BY MOUTH DAILY, Disp: 90 tablet, Rfl: 1   pseudoephedrine (SUDAFED) 60 MG tablet, Take 1 tablet (60 mg total) by mouth every 8 (eight) hours as needed for congestion., Disp: 30 tablet, Rfl: 0   sertraline (ZOLOFT) 50 MG tablet, TAKE 1 TABLET(50 MG) BY MOUTH DAILY, Disp: 60 tablet, Rfl: 1   tiZANidine (ZANAFLEX) 4 MG tablet, Take 1 tablet (4 mg total) by mouth at bedtime., Disp: 30 tablet, Rfl: 0   traZODone (DESYREL) 100 MG tablet, Take 1 tablet (100 mg total) by mouth at bedtime as needed for sleep., Disp: 30 tablet, Rfl: 2   trimethoprim-polymyxin b (POLYTRIM) ophthalmic solution, Apply 1-2 drops into affected eye QID x 5 days., Disp: 10 mL, Rfl: 0   valsartan-hydrochlorothiazide (DIOVAN-HCT) 160-25 MG tablet, Take 1 tablet by mouth daily., Disp: 90 tablet, Rfl: 3   Medications  ordered in this encounter:  No orders of the defined types were placed in this encounter.    *If you need refills on other medications prior to your next appointment, please contact your pharmacy*  Follow-Up: Call back or seek an in-person evaluation if the symptoms worsen or if the condition fails to improve as anticipated.  [facility] [phone]  Other Instructions Call dentist   If you have been instructed to have an in-person evaluation today at a local Urgent Care facility, please use the link below. It will take you to a list of all of  our available [facility], including address, phone number and hours of operation. Please do not delay care.  [facility]  If you or a family member do not have a primary care provider, use the link below to schedule a visit and establish care. When you choose a [facility] primary care physician or advanced practice provider, you gain a long-term partner in health. Find a Primary Care Provider  Learn more about [facility]'s in-office and virtual care options: [facility] - Get Care Now

## 2022-10-15 NOTE — Progress Notes (Signed)
Virtual Visit Consent   Ann Moran, you are scheduled for a virtual visit with a [facility] provider today. Just as with appointments in the office, your consent must be obtained to participate. Your consent will be active for this visit and any virtual visit you may have with one of our providers in the next 365 days. If you have a MyChart account, a copy of this consent can be sent to you electronically.  As this is a virtual visit, video technology does not allow for your provider to perform a traditional examination. This may limit your provider's ability to fully assess your condition. If your provider identifies any concerns that need to be evaluated in person or the need to arrange testing (such as labs, EKG, etc.), we will make arrangements to do so. Although advances in technology are sophisticated, we cannot ensure that it will always work on either your end or our end. If the connection with a video visit is poor, the visit may have to be switched to a telephone visit. With either a video or telephone visit, we are not always able to ensure that we have a secure connection.  By engaging in this virtual visit, you consent to the provision of healthcare and authorize for your insurance to be billed (if applicable) for the services provided during this visit. Depending on your insurance coverage, you may receive a charge related to this service.  I need to obtain your verbal consent now. Are you willing to proceed with your visit today? Ann Moran has provided verbal consent on 10/15/2022 for a virtual visit (video or telephone). Freddy Finner, NP  Date: 10/15/2022 12:22 PM  Virtual Visit via Video Note   I, Freddy Finner, connected with  Ann Moran  ([id number], [birth year]) on 10/15/22 at 12:15 PM EDT by a video-enabled telemedicine application and verified that I am speaking with the correct person using two identifiers.  Location: Patient: Virtual Visit Location Patient:  Home Provider: Virtual Visit Location Provider: Home Office   I discussed the limitations of evaluation and management by telemedicine and the availability of in person appointments. The patient expressed understanding and agreed to proceed.    History of Present Illness: Ann Moran is a 37 y.o. who identifies as a female who was assigned female at birth, and is being seen today for dental pain  Onset was several days ago- had a tooth pulled- reports Dentist placed needle to close to a nerve and she is having a lot of pain 10/10. Was seen by the dentist but not given pain medication. Reports today due to pain.  Was treated for infection on 10/03/22 Denies chest pain, shortness of breath, fevers, chills    Problems:  Patient Active Problem List   Diagnosis Date Noted   Sinusitis, acute 01/31/2022   Migraine 01/31/2022   Anxiety and depression 01/31/2022   OSA (obstructive sleep apnea) 01/25/2022   Smoking 02/23/2021   Patellar tendinitis of both knees 01/27/2021   Pain in joint involving right ankle and foot 01/27/2021   Thoracic back pain 12/23/2020   Right elbow pain 12/23/2020   Chronic pain of right knee 12/23/2020   Ankle fracture, left 08/09/2020   Dysphagia 05/20/2019   Encounter for other preprocedural examination 05/20/2019   PCOS (polycystic ovarian syndrome) 02/24/2019   Neck pain 02/24/2019   Morbid obesity with body mass index (BMI) of 45.0 to 49.9 in adult [facility]) 02/24/2019   Essential hypertension 02/24/2019  Gastroesophageal reflux disease 02/24/2019   Irregular menstrual cycle 01/14/2019   Abnormal uterine bleeding 01/14/2019   Chronic cough 01/14/2019    Allergies:  Allergies  Allergen Reactions   Prednisone Swelling    Had neck and shoulder swelling following taking   Medications:  Current Outpatient Medications:    albuterol (VENTOLIN HFA) 108 (90 Base) MCG/ACT inhaler, INHALE 2 PUFFS INTO THE LUNGS EVERY 6 HOURS AS NEEDED FOR WHEEZING OR  SHORTNESS OF BREATH, Disp: 54 g, Rfl: 1   amitriptyline (ELAVIL) 25 MG tablet, Take 1 tablet (25 mg total) by mouth at bedtime. For migraines, Disp: 40 tablet, Rfl: 2   amLODipine (NORVASC) 10 MG tablet, TAKE 1 TABLET(10 MG) BY MOUTH DAILY. Please make PCP appointment for more refills., Disp: 30 tablet, Rfl: 0   Blood Pressure Monitoring (BLOOD PRESSURE CUFF) MISC, Use to check blood pressure once daily., Disp: 1 each, Rfl: 0   busPIRone (BUSPAR) 15 MG tablet, TAKE 1 TABLET(15 MG) BY MOUTH TWICE DAILY, Disp: 60 tablet, Rfl: 2   cetirizine (ZYRTEC ALLERGY) 10 MG tablet, Take 1 tablet (10 mg total) by mouth at bedtime for 15 days., Disp: 15 tablet, Rfl: 0   chlorhexidine (HIBICLENS) 4 % external liquid, Apply topically daily as needed (wash area in shower once daily)., Disp: 236 mL, Rfl: 1   erythromycin ophthalmic ointment, Place 1 Application into the left eye at bedtime., Disp: 3.5 g, Rfl: 0   fluticasone (FLONASE) 50 MCG/ACT nasal spray, Place 2 sprays into both nostrils daily., Disp: 16 g, Rfl: 6   hydrOXYzine (ATARAX) 25 MG tablet, TAKE 1 TABLET(25 MG) BY MOUTH EVERY 8 HOURS AS NEEDED FOR ANXIETY, Disp: 60 tablet, Rfl: 0   ibuprofen (ADVIL) 600 MG tablet, Take 1 tablet (600 mg total) by mouth every 8 (eight) hours as needed., Disp: 30 tablet, Rfl: 0   meloxicam (MOBIC) 7.5 MG tablet, Take 1 tablet (7.5 mg total) by mouth daily., Disp: 30 tablet, Rfl: 1   mupirocin ointment (BACTROBAN) 2 %, Apply 1 Application topically 2 (two) times daily., Disp: 22 g, Rfl: 0   olopatadine (PATANOL) 0.1 % ophthalmic solution, Place 1 drop into the left eye 2 (two) times daily., Disp: 5 mL, Rfl: 0   pantoprazole (PROTONIX) 40 MG tablet, TAKE 1 TABLET(40 MG) BY MOUTH DAILY, Disp: 90 tablet, Rfl: 1   pseudoephedrine (SUDAFED) 60 MG tablet, Take 1 tablet (60 mg total) by mouth every 8 (eight) hours as needed for congestion., Disp: 30 tablet, Rfl: 0   sertraline (ZOLOFT) 50 MG tablet, TAKE 1 TABLET(50 MG) BY MOUTH  DAILY, Disp: 60 tablet, Rfl: 1   tiZANidine (ZANAFLEX) 4 MG tablet, Take 1 tablet (4 mg total) by mouth at bedtime., Disp: 30 tablet, Rfl: 0   traZODone (DESYREL) 100 MG tablet, Take 1 tablet (100 mg total) by mouth at bedtime as needed for sleep., Disp: 30 tablet, Rfl: 2   trimethoprim-polymyxin b (POLYTRIM) ophthalmic solution, Apply 1-2 drops into affected eye QID x 5 days., Disp: 10 mL, Rfl: 0   valsartan-hydrochlorothiazide (DIOVAN-HCT) 160-25 MG tablet, Take 1 tablet by mouth daily., Disp: 90 tablet, Rfl: 3  Observations/Objective: Patient is well-developed, well-nourished in no acute distress.  Resting comfortably  at home.  Head is normocephalic, atraumatic.  No labored breathing.  Speech is clear and coherent with logical content.  Patient is alert and oriented at baseline.    Assessment and Plan:    1. Pain, dental  -advised to call dentist back about a treatment plan  for this    Patient acknowledged agreement and understanding of the plan.   Past Medical, Surgical, Social History, Allergies, and Medications have been Reviewed.   Follow Up Instructions: I discussed the assessment and treatment plan with the patient. The patient was provided an opportunity to ask questions and all were answered. The patient agreed with the plan and demonstrated an understanding of the instructions.  A copy of instructions were sent to the patient via MyChart unless otherwise noted below.    The patient was advised to call back or seek an in-person evaluation if the symptoms worsen or if the condition fails to improve as anticipated.  Time:  I spent 10 minutes with the patient via telehealth technology discussing the above problems/concerns.    Freddy Finner, NP

## 2022-12-05 ENCOUNTER — Ambulatory Visit: Payer: Medicaid Other | Admitting: Family Medicine

## 2022-12-11 ENCOUNTER — Ambulatory Visit: Payer: Medicaid Other | Admitting: Family Medicine

## 2022-12-19 ENCOUNTER — Ambulatory Visit: Payer: Medicaid Other | Admitting: Family Medicine

## 2022-12-27 ENCOUNTER — Ambulatory Visit: Payer: Medicaid Other | Admitting: Family Medicine

## 2023-01-18 ENCOUNTER — Telehealth: Payer: Medicaid Other | Admitting: Family Medicine

## 2023-01-18 DIAGNOSIS — L299 Pruritus, unspecified: Secondary | ICD-10-CM | POA: Diagnosis not present

## 2023-01-18 NOTE — Progress Notes (Signed)
Virtual Visit Consent   Ann Moran, you are scheduled for a virtual visit with a [facility] provider today. Just as with appointments in the office, your consent must be obtained to participate. Your consent will be active for this visit and any virtual visit you may have with one of our providers in the next 365 days. If you have a MyChart account, a copy of this consent can be sent to you electronically.  As this is a virtual visit, video technology does not allow for your provider to perform a traditional examination. This may limit your provider's ability to fully assess your condition. If your provider identifies any concerns that need to be evaluated in person or the need to arrange testing (such as labs, EKG, etc.), we will make arrangements to do so. Although advances in technology are sophisticated, we cannot ensure that it will always work on either your end or our end. If the connection with a video visit is poor, the visit may have to be switched to a telephone visit. With either a video or telephone visit, we are not always able to ensure that we have a secure connection.  By engaging in this virtual visit, you consent to the provision of healthcare and authorize for your insurance to be billed (if applicable) for the services provided during this visit. Depending on your insurance coverage, you may receive a charge related to this service.  I need to obtain your verbal consent now. Are you willing to proceed with your visit today? Ann Moran has provided verbal consent on 01/18/2023 for a virtual visit (video or telephone). Georgana Curio, FNP  Date: 01/18/2023 5:16 PM  Virtual Visit via Video Note   I, Georgana Curio, connected with  Ann Moran  ([id number], 20-May-1985) on 01/18/23 at  5:15 PM EDT by a video-enabled telemedicine application and verified that I am speaking with the correct person using two identifiers.  Location: Patient: Virtual Visit Location Patient:  Home Provider: Virtual Visit Location Provider: Home Office   I discussed the limitations of evaluation and management by telemedicine and the availability of in person appointments. The patient expressed understanding and agreed to proceed.    History of Present Illness: Ann Moran is a 37 y.o. who identifies as a female who was assigned female at birth, and is being seen today for itching all over. She has watering eyes and runny nose. She is not aware of any causes such as new meds, foods, or detergents, lotions, soaps. No trouble swallowing or breathing. Sx for 2 days. Marland Kitchen  HPI: HPI  Problems:  Patient Active Problem List   Diagnosis Date Noted   Sinusitis, acute 01/31/2022   Migraine 01/31/2022   Anxiety and depression 01/31/2022   OSA (obstructive sleep apnea) 01/25/2022   Smoking 02/23/2021   Patellar tendinitis of both knees 01/27/2021   Pain in joint involving right ankle and foot 01/27/2021   Thoracic back pain 12/23/2020   Right elbow pain 12/23/2020   Chronic pain of right knee 12/23/2020   Ankle fracture, left 08/09/2020   Dysphagia 05/20/2019   Encounter for other preprocedural examination 05/20/2019   PCOS (polycystic ovarian syndrome) 02/24/2019   Neck pain 02/24/2019   Morbid obesity with body mass index (BMI) of 45.0 to 49.9 in adult [facility]) 02/24/2019   Essential hypertension 02/24/2019   Gastroesophageal reflux disease 02/24/2019   Irregular menstrual cycle 01/14/2019   Abnormal uterine bleeding 01/14/2019   Chronic cough 01/14/2019  Allergies:  Allergies  Allergen Reactions   Prednisone Swelling    Had neck and shoulder swelling following taking   Medications:  Current Outpatient Medications:    albuterol (VENTOLIN HFA) 108 (90 Base) MCG/ACT inhaler, INHALE 2 PUFFS INTO THE LUNGS EVERY 6 HOURS AS NEEDED FOR WHEEZING OR SHORTNESS OF BREATH, Disp: 54 g, Rfl: 1   amitriptyline (ELAVIL) 25 MG tablet, Take 1 tablet (25 mg total) by mouth at bedtime. For  migraines, Disp: 40 tablet, Rfl: 2   amLODipine (NORVASC) 10 MG tablet, TAKE 1 TABLET(10 MG) BY MOUTH DAILY. Please make PCP appointment for more refills., Disp: 30 tablet, Rfl: 0   Blood Pressure Monitoring (BLOOD PRESSURE CUFF) MISC, Use to check blood pressure once daily., Disp: 1 each, Rfl: 0   busPIRone (BUSPAR) 15 MG tablet, TAKE 1 TABLET(15 MG) BY MOUTH TWICE DAILY, Disp: 60 tablet, Rfl: 2   cetirizine (ZYRTEC ALLERGY) 10 MG tablet, Take 1 tablet (10 mg total) by mouth at bedtime for 15 days., Disp: 15 tablet, Rfl: 0   chlorhexidine (HIBICLENS) 4 % external liquid, Apply topically daily as needed (wash area in shower once daily)., Disp: 236 mL, Rfl: 1   erythromycin ophthalmic ointment, Place 1 Application into the left eye at bedtime., Disp: 3.5 g, Rfl: 0   fluticasone (FLONASE) 50 MCG/ACT nasal spray, Place 2 sprays into both nostrils daily., Disp: 16 g, Rfl: 6   hydrOXYzine (ATARAX) 25 MG tablet, TAKE 1 TABLET(25 MG) BY MOUTH EVERY 8 HOURS AS NEEDED FOR ANXIETY, Disp: 60 tablet, Rfl: 0   ibuprofen (ADVIL) 600 MG tablet, Take 1 tablet (600 mg total) by mouth every 8 (eight) hours as needed., Disp: 30 tablet, Rfl: 0   meloxicam (MOBIC) 7.5 MG tablet, Take 1 tablet (7.5 mg total) by mouth daily., Disp: 30 tablet, Rfl: 1   mupirocin ointment (BACTROBAN) 2 %, Apply 1 Application topically 2 (two) times daily., Disp: 22 g, Rfl: 0   olopatadine (PATANOL) 0.1 % ophthalmic solution, Place 1 drop into the left eye 2 (two) times daily., Disp: 5 mL, Rfl: 0   pantoprazole (PROTONIX) 40 MG tablet, TAKE 1 TABLET(40 MG) BY MOUTH DAILY, Disp: 90 tablet, Rfl: 1   pseudoephedrine (SUDAFED) 60 MG tablet, Take 1 tablet (60 mg total) by mouth every 8 (eight) hours as needed for congestion., Disp: 30 tablet, Rfl: 0   sertraline (ZOLOFT) 50 MG tablet, TAKE 1 TABLET(50 MG) BY MOUTH DAILY, Disp: 60 tablet, Rfl: 1   tiZANidine (ZANAFLEX) 4 MG tablet, Take 1 tablet (4 mg total) by mouth at bedtime., Disp: 30 tablet,  Rfl: 0   traZODone (DESYREL) 100 MG tablet, Take 1 tablet (100 mg total) by mouth at bedtime as needed for sleep., Disp: 30 tablet, Rfl: 2   trimethoprim-polymyxin b (POLYTRIM) ophthalmic solution, Apply 1-2 drops into affected eye QID x 5 days., Disp: 10 mL, Rfl: 0   valsartan-hydrochlorothiazide (DIOVAN-HCT) 160-25 MG tablet, Take 1 tablet by mouth daily., Disp: 90 tablet, Rfl: 3  Observations/Objective: Patient is well-developed, well-nourished in no acute distress.  Resting comfortably  at home.  Head is normocephalic, atraumatic.  No labored breathing.  Speech is clear and coherent with logical content.  Patient is alert and oriented at baseline.    Assessment and Plan: 1. Pruritus  Pt is allergic to prednisone and unable to take. She will start allegra otc and go to ED if sx persist or worsen.   Follow Up Instructions: I discussed the assessment and treatment plan with the  patient. The patient was provided an opportunity to ask questions and all were answered. The patient agreed with the plan and demonstrated an understanding of the instructions.  A copy of instructions were sent to the patient via MyChart unless otherwise noted below.     The patient was advised to call back or seek an in-person evaluation if the symptoms worsen or if the condition fails to improve as anticipated.  Time:  I spent 10 minutes with the patient via telehealth technology discussing the above problems/concerns.    Georgana Curio, FNP

## 2023-01-18 NOTE — Patient Instructions (Signed)
Pruritus Pruritus is an itchy feeling on the skin. One of the most common causes is dry skin, but many different things can cause itching. Most cases of itching do not require medical attention. Sometimes itchy skin can turn into a rash or a secondary infection. Follow these instructions at home: Skin care  Do not use scented soaps, detergents, perfumes, and cosmetic products. Instead, use gentle, unscented versions of these items. Apply moisturizing creams to your skin frequently, at least twice daily. Apply immediately after bathing while skin is still wet. Take medicines or apply medicated creams only as told by your health care provider. This may include: Corticosteroid cream or topical calcineurin inhibitor. Anti-itch lotions containing urea, camphor, or menthol. Oral antihistamines. Do not take hot showers or baths, which can make itching worse. A short, cool shower may help with itching as long as you apply moisturizing lotion after the shower. Apply a cool, wet cloth (cool compress) to the affected areas. You may take lukewarm baths with one of the following: Epsom salts. You can get these at your local pharmacy or grocery store. Follow the instructions on the packaging. Baking soda. Pour a small amount into the bath as told by your health care provider. Colloidal oatmeal. You can get this at your local pharmacy or grocery store. Follow the instructions on the packaging. Do not scratch your skin. General instructions Avoid wearing tight clothes. Keep a journal to help find out what is causing your itching. Write down: What you eat and drink. What cosmetic products you use. What soaps or detergents you use. What you wear, including jewelry. Use a humidifier. This keeps the air moist, which helps to prevent dry skin. Be aware of any changes in your itchiness. Tell your health care provider about any changes. Contact a health care provider if: The itching does not go away after  several days. You notice redness, warmth, or drainage on the skin where you have scratched. You are unusually thirsty or urinating more than normal. Your skin tingles or feels numb. Your skin or the white parts of your eyes turn yellow (jaundice). You feel weak. You have any of the following: Night sweats. Tiredness (fatigue). Weight loss. Abdominal pain. Summary Pruritus is an itchy feeling on the skin. One of the most common causes is dry skin, but many different conditions and factors can cause itching. Apply moisturizing creams to your skin frequently, at least twice daily. Apply immediately after bathing while skin is still wet. Take medicines or apply medicated creams only as told by your health care provider. Do not take hot showers or baths. Do not use scented soaps, detergents, perfumes, or cosmetic products. Keep a journal to help find out what is causing your itching. This information is not intended to replace advice given to you by your health care provider. Make sure you discuss any questions you have with your health care provider. Document Revised: 05/17/2021 Document Reviewed: 05/17/2021 Elsevier Patient Education  2024 [employer].

## 2023-02-05 ENCOUNTER — Telehealth: Payer: Medicaid Other | Admitting: Physician Assistant

## 2023-02-05 ENCOUNTER — Other Ambulatory Visit: Payer: Self-pay | Admitting: Physician Assistant

## 2023-02-05 DIAGNOSIS — J208 Acute bronchitis due to other specified organisms: Secondary | ICD-10-CM | POA: Diagnosis not present

## 2023-02-05 MED ORDER — ALBUTEROL SULFATE HFA 108 (90 BASE) MCG/ACT IN AERS
1.0000 | INHALATION_SPRAY | Freq: Four times a day (QID) | RESPIRATORY_TRACT | 0 refills | Status: DC | PRN
Start: 2023-02-05 — End: 2023-03-07

## 2023-02-05 MED ORDER — BENZONATATE 100 MG PO CAPS
100.0000 mg | ORAL_CAPSULE | Freq: Three times a day (TID) | ORAL | 0 refills | Status: DC | PRN
Start: 2023-02-05 — End: 2023-03-20

## 2023-02-05 NOTE — Progress Notes (Signed)
E-Visit for Cough   We are sorry that you are not feeling well.  Here is how we plan to help!  Based on your presentation I believe you most likely have A cough due to a virus.  This is called viral bronchitis and is best treated by rest, plenty of fluids and control of the cough.  You may use Ibuprofen or Tylenol as directed to help your symptoms.     In addition you may use A non-prescription cough medication called Mucinex DM: take 2 tablets every 12 hours. and A prescription cough medication called Tessalon Perles 100mg . You may take 1-2 capsules every 8 hours as needed for your cough.  Albuterol inhaler Use 1-2 puffs every 4-6 hours as needed for shortness of breath and/or wheezing.  From your responses in the eVisit questionnaire you describe inflammation in the upper respiratory tract which is causing a significant cough.  This is commonly called Bronchitis and has four common causes:   Allergies Viral Infections Acid Reflux Bacterial Infection Allergies, viruses and acid reflux are treated by controlling symptoms or eliminating the cause. An example might be a cough caused by taking certain blood pressure medications. You stop the cough by changing the medication. Another example might be a cough caused by acid reflux. Controlling the reflux helps control the cough.  USE OF BRONCHODILATOR ("RESCUE") INHALERS: There is a risk from using your bronchodilator too frequently.  The risk is that over-reliance on a medication which only relaxes the muscles surrounding the breathing tubes can reduce the effectiveness of medications prescribed to reduce swelling and congestion of the tubes themselves.  Although you feel brief relief from the bronchodilator inhaler, your asthma may actually be worsening with the tubes becoming more swollen and filled with mucus.  This can delay other crucial treatments, such as oral steroid medications. If you need to use a bronchodilator inhaler daily, several times  per day, you should discuss this with your provider.  There are probably better treatments that could be used to keep your asthma under control.     HOME CARE Only take medications as instructed by your medical team. Complete the entire course of an antibiotic. Drink plenty of fluids and get plenty of rest. Avoid close contacts especially the very young and the elderly Cover your mouth if you cough or cough into your sleeve. Always remember to wash your hands A steam or ultrasonic humidifier can help congestion.   GET HELP RIGHT AWAY IF: You develop worsening fever. You become short of breath You cough up blood. Your symptoms persist after you have completed your treatment plan MAKE SURE YOU  Understand these instructions. Will watch your condition. Will get help right away if you are not doing well or get worse.    Thank you for choosing an e-visit.  Your e-visit answers were reviewed by a board certified advanced clinical practitioner to complete your personal care plan. Depending upon the condition, your plan could have included both over the counter or prescription medications.  Please review your pharmacy choice. Make sure the pharmacy is open so you can pick up prescription now. If there is a problem, you may contact your provider through [employer] and have the prescription routed to another pharmacy.  Your safety is important to [place]. If you have drug allergies check your prescription carefully.   For the next 24 hours you can use MyChart to ask questions about today's visit, request a non-urgent call back, or ask for a work  or school excuse. You will get an email in the next two days asking about your experience. I hope that your e-visit has been valuable and will speed your recovery.  I have spent 5 minutes in review of e-visit questionnaire, review and updating patient chart, medical decision making and response to patient.   [name], PA-C

## 2023-02-10 ENCOUNTER — Telehealth: Payer: Medicaid Other | Admitting: Physician Assistant

## 2023-02-10 DIAGNOSIS — H9203 Otalgia, bilateral: Secondary | ICD-10-CM

## 2023-02-10 MED ORDER — FLUTICASONE PROPIONATE 50 MCG/ACT NA SUSP: 2.0000 | Freq: Every day | NASAL | 6 refills | Status: DC

## 2023-02-10 NOTE — Progress Notes (Signed)
E-Visit for Ear Pain - Eustachian Tube Dysfunction   We are sorry that you are not feeling well. Here is how we plan to help!  Based on what you have shared with me it looks like you have Eustachian Tube Dysfunction.  Eustachian Tube Dysfunction is a condition where the tubes that connect your middle ears to your upper throat become blocked. This can lead to discomfort, hearing difficulties and a feeling of fullness in your ear. Eustachian tube dysfunction usually resolves itself in a few days. The usual symptoms include: Hearing problems Tinnitus, or ringing in your ears Clicking or popping sounds A feeling of fullness in your ears Pain that mimics an ear infection Dizziness, vertigo or balance problems A "tickling" sensation in your ears  ?Eustachian tube dysfunction symptoms may get worse in higher altitudes. This is called barotrauma, and it can happen while scuba diving, flying in an airplane or driving in the mountains.   What causes eustachian tube dysfunction? Allergies and infections (like the common cold and the flu) are the most common causes of eustachian tube dysfunction. These conditions can cause inflammation and mucus buildup, leading to blockage. GERD, or chronic acid reflux, can also cause ETD. This is because stomach acid can back up into your throat and result in inflammation. As mentioned above, altitude changes can also cause ETD.   What are some common eustachian tube dysfunction treatments? In most cases, treatment isn't necessary because ETD often resolves on its own. However, you might need treatment if your symptoms linger for more than two weeks.    Eustachian tube dysfunction treatment depends on the cause and the severity of your condition. Treatments may include home remedies, medications or, in severe cases, surgery.     HOME CARE: Sometimes simple home remedies can help with mild cases of eustachian tube dysfunction. To try and clear the blockage, you  can: Chew gum. Yawn. Swallow. Try the Valsalva maneuver (breathing out forcefully while closing your mouth and pinching your nostrils). Use a saline spray to clear out nasal passages.  MEDICATIONS: Over-the-counter medications can help if allergies are causing eustachian tube dysfunction. Try antihistamines (like cetirizine or diphenhydramine) to ease your symptoms. If you have discomfort, pain relievers -- such as acetaminophen or ibuprofen -- can help.  Sometimes intranasal glucocorticosteroids (like Flonase or Nasacort) help.  I have prescribed Fluticasone 50 mcg/spray 2 sprays in each nostril daily for 10-14 days  Recommend holding the drops at this time.  Can also consider Zyrtec or Claritin as well instead of Benadryl.   GET HELP RIGHT AWAY IF: Fever is over 102.2 degrees. You develop progressive ear pain or hearing loss. Ear symptoms persist longer than 3 days after treatment.  MAKE SURE YOU: Understand these instructions. Will watch your condition. Will get help right away if you are not doing well or get worse.  Thank you for choosing an e-visit.  Your e-visit answers were reviewed by a board certified advanced clinical practitioner to complete your personal care plan. Depending upon the condition, your plan could have included both over the counter or prescription medications.  Please review your pharmacy choice. Make sure the pharmacy is open so you can pick up the prescription now. If there is a problem, you may contact your provider through [employer] and have the prescription routed to another pharmacy.  Your safety is important to [place]. If you have drug allergies check your prescription carefully.   For the next 24 hours you can use MyChart to ask  questions about today's visit, request a non-urgent call back, or ask for a work or school excuse. You will get an email with a survey after your eVisit asking about your experience. We would appreciate your feedback. I  hope that your e-visit has been valuable and will aid in your recovery.  I have spent 5 minutes in review of e-visit questionnaire, review and updating patient chart, medical decision making and response to patient.   Tylene Fantasia Ward, PA-C

## 2023-03-07 ENCOUNTER — Other Ambulatory Visit: Payer: Self-pay

## 2023-03-07 ENCOUNTER — Ambulatory Visit
Admission: RE | Admit: 2023-03-07 | Discharge: 2023-03-07 | Disposition: A | Discharge status: Home / Self Care | Payer: Medicaid Other | Source: Ambulatory Visit | Attending: Internal Medicine | Admitting: Internal Medicine

## 2023-03-07 ENCOUNTER — Ambulatory Visit: Payer: Medicaid Other

## 2023-03-07 VITALS — BP 134/80 | HR 120 | Temp 98.0°F | Resp 22

## 2023-03-07 DIAGNOSIS — J40 Bronchitis, not specified as acute or chronic: Secondary | ICD-10-CM

## 2023-03-07 DIAGNOSIS — Z3202 Encounter for pregnancy test, result negative: Secondary | ICD-10-CM | POA: Diagnosis not present

## 2023-03-07 DIAGNOSIS — F17200 Nicotine dependence, unspecified, uncomplicated: Secondary | ICD-10-CM | POA: Diagnosis not present

## 2023-03-07 DIAGNOSIS — J329 Chronic sinusitis, unspecified: Secondary | ICD-10-CM

## 2023-03-07 LAB — POCT URINE PREGNANCY: Preg Test, Ur: NEGATIVE

## 2023-03-07 MED ORDER — ALBUTEROL SULFATE HFA 108 (90 BASE) MCG/ACT IN AERS: 1.0000 | INHALATION_SPRAY | Freq: Four times a day (QID) | RESPIRATORY_TRACT | 0 refills | Status: DC | PRN

## 2023-03-07 MED ORDER — METHYLPREDNISOLONE ACETATE 40 MG/ML IJ SUSP: 40.0000 mg | Freq: Once | INTRAMUSCULAR | Status: DC

## 2023-03-07 MED ORDER — PROMETHAZINE-DM 6.25-15 MG/5ML PO SYRP: 5.0000 mL | ORAL_SOLUTION | Freq: Three times a day (TID) | ORAL | 0 refills | Status: DC | PRN

## 2023-03-07 MED ORDER — METHYLPREDNISOLONE ACETATE 80 MG/ML IJ SUSP
40.0000 mg | Freq: Once | INTRAMUSCULAR | Status: AC
  Administered 2023-03-07: 40 mg via INTRAMUSCULAR

## 2023-03-07 MED ORDER — AMOXICILLIN-POT CLAVULANATE 875-125 MG PO TABS: 1.0000 | ORAL_TABLET | Freq: Two times a day (BID) | ORAL | 0 refills | Status: DC

## 2023-03-07 NOTE — ED Provider Notes (Signed)
[facility]  Note:  This document was prepared using Conservation officer, historic buildings and may include unintentional dictation errors.  MRN: [id number] DOB: August 09, 1985  Subjective:   Ann Moran is a 37 y.o. female presenting for 1 week history of persistent malaise, fatigue, sinus pressure, coughing, shortness of breath.  Believes symptoms started after she ate a hard piece of candy.  Smokes 1/2 pack/day.  Is working on quitting. Patient is prediabetic.  Has PCOS, cannot recall her last cycle.  No history of asthma.  No history of clotting disorders.  Patient is not on any contraception.   No current facility-administered medications for this encounter.  Current Outpatient Medications:    albuterol (VENTOLIN HFA) 108 (90 Base) MCG/ACT inhaler, Inhale 1-2 puffs into the lungs every 6 (six) hours as needed., Disp: 8 g, Rfl: 0   amitriptyline (ELAVIL) 25 MG tablet, Take 1 tablet (25 mg total) by mouth at bedtime. For migraines, Disp: 40 tablet, Rfl: 2   amLODipine (NORVASC) 10 MG tablet, TAKE 1 TABLET(10 MG) BY MOUTH DAILY. Please make PCP appointment for more refills., Disp: 30 tablet, Rfl: 0   benzonatate (TESSALON) 100 MG capsule, Take 1-2 capsules (100-200 mg total) by mouth 3 (three) times daily as needed., Disp: 30 capsule, Rfl: 0   Blood Pressure Monitoring (BLOOD PRESSURE CUFF) MISC, Use to check blood pressure once daily., Disp: 1 each, Rfl: 0   busPIRone (BUSPAR) 15 MG tablet, TAKE 1 TABLET(15 MG) BY MOUTH TWICE DAILY, Disp: 60 tablet, Rfl: 2   cetirizine (ZYRTEC ALLERGY) 10 MG tablet, Take 1 tablet (10 mg total) by mouth at bedtime for 15 days., Disp: 15 tablet, Rfl: 0   chlorhexidine (HIBICLENS) 4 % external liquid, Apply topically daily as needed (wash area in shower once daily)., Disp: 236 mL, Rfl: 1   fluticasone (FLONASE) 50 MCG/ACT nasal spray, Place 2 sprays into both nostrils daily., Disp: 16 g, Rfl: 6   hydrOXYzine (ATARAX) 25 MG tablet, TAKE 1  TABLET(25 MG) BY MOUTH EVERY 8 HOURS AS NEEDED FOR ANXIETY, Disp: 60 tablet, Rfl: 0   ibuprofen (ADVIL) 600 MG tablet, Take 1 tablet (600 mg total) by mouth every 8 (eight) hours as needed., Disp: 30 tablet, Rfl: 0   meloxicam (MOBIC) 7.5 MG tablet, Take 1 tablet (7.5 mg total) by mouth daily., Disp: 30 tablet, Rfl: 1   mupirocin ointment (BACTROBAN) 2 %, Apply 1 Application topically 2 (two) times daily., Disp: 22 g, Rfl: 0   olopatadine (PATANOL) 0.1 % ophthalmic solution, Place 1 drop into the left eye 2 (two) times daily., Disp: 5 mL, Rfl: 0   pantoprazole (PROTONIX) 40 MG tablet, TAKE 1 TABLET(40 MG) BY MOUTH DAILY, Disp: 90 tablet, Rfl: 1   pseudoephedrine (SUDAFED) 60 MG tablet, Take 1 tablet (60 mg total) by mouth every 8 (eight) hours as needed for congestion., Disp: 30 tablet, Rfl: 0   sertraline (ZOLOFT) 50 MG tablet, TAKE 1 TABLET(50 MG) BY MOUTH DAILY, Disp: 60 tablet, Rfl: 1   tiZANidine (ZANAFLEX) 4 MG tablet, Take 1 tablet (4 mg total) by mouth at bedtime., Disp: 30 tablet, Rfl: 0   traZODone (DESYREL) 100 MG tablet, Take 1 tablet (100 mg total) by mouth at bedtime as needed for sleep., Disp: 30 tablet, Rfl: 2   valsartan-hydrochlorothiazide (DIOVAN-HCT) 160-25 MG tablet, Take 1 tablet by mouth daily., Disp: 90 tablet, Rfl: 3   Allergies  Allergen Reactions   Prednisone Swelling    Had neck and shoulder  swelling following taking    Past Medical History:  Diagnosis Date   Allergy    Anemia    Anxiety    Chlamydia    Depression    hx of meds, none currently- doing ok   Diabetes mellitus without complication (HCC)    GERD (gastroesophageal reflux disease)    Infection due to trichomonas    MRSA (methicillin resistant staph aureus) culture positive    Dec 2012   Obesity    Pregnancy induced hypertension    Urinary tract infection      Past Surgical History:  Procedure Laterality Date   CHOLECYSTECTOMY     ECTOPIC PREGNANCY SURGERY     ESOPHAGOGASTRODUODENOSCOPY   05/2019    Family History  Problem Relation Age of Onset   Hypertension Mother    Diabetes Mother    Asthma Father    Hypertension Father    Stroke Maternal Grandmother    Diabetes Paternal Grandmother    Hypertension Paternal Grandmother    Colon cancer Paternal Grandmother    Colon cancer Paternal Aunt    Esophageal cancer Neg Hx    Stomach cancer Neg Hx    Rectal cancer Neg Hx     Social History   Tobacco Use   Smoking status: Every Day    Current packs/day: 0.50    Average packs/day: 0.5 packs/day for 9.0 years (4.5 ttl pk-yrs)    Types: Cigarettes    Passive exposure: Past   Smokeless tobacco: Never   Tobacco comments:    Pt currently smoking 01/25/22 PAP  Vaping Use   Vaping status: Former   Quit date: 08/18/2015  Substance Use Topics   Alcohol use: Not Currently   Drug use: Yes    Types: Marijuana    Comment: Smokes daily to treat migraines    ROS   Objective:   Vitals: BP 134/80 (BP Location: Right Arm)   Pulse (!) 120   Temp 98 F (36.7 C) (Oral)   Resp (!) 22   SpO2 90%   Pulse range from 111bpm - 123bpm.  Physical Exam Constitutional:      General: She is not in acute distress.    Appearance: Normal appearance. She is well-developed. She is not ill-appearing, toxic-appearing or diaphoretic.  HENT:     Head: Normocephalic and atraumatic.     Right Ear: External ear normal.     Left Ear: External ear normal.     Nose: Congestion present. No rhinorrhea.     Mouth/Throat:     Mouth: Mucous membranes are moist.     Pharynx: No pharyngeal swelling, oropharyngeal exudate, posterior oropharyngeal erythema or uvula swelling.     Tonsils: No tonsillar exudate or tonsillar abscesses. 0 on the right. 0 on the left.  Eyes:     General: No scleral icterus.       Right eye: No discharge.        Left eye: No discharge.     Extraocular Movements: Extraocular movements intact.  Cardiovascular:     Rate and Rhythm: Regular rhythm. Tachycardia present.      Heart sounds: Normal heart sounds. No murmur heard.    No friction rub. No gallop.  Pulmonary:     Effort: No respiratory distress.     Breath sounds: No stridor. No wheezing, rhonchi or rales.     Comments: Decreased lung sounds throughout. Chest:     Chest wall: No tenderness.  Skin:    General: Skin is warm and dry.  Neurological:     General: No focal deficit present.     Mental Status: She is alert and oriented to person, place, and time.  Psychiatric:        Mood and Affect: Mood normal.        Behavior: Behavior normal.    IM Depo-Medrol 40 mg administered in clinic.  Assessment and Plan :   PDMP not reviewed this encounter.  1. Sinobronchitis   2. Smoker    Recommended management for sinobronchitis with Augmentin, IM steroids as above.  Discussed possibility of pulmonary embolism but will defer ER visit for now.  Maintain strict ER precautions for the next 24 hours.  X-ray over-read was pending at time of discharge, recommended follow up with only abnormal results. Otherwise will not call for negative over-read. Patient was in agreement.  Counseled patient on potential for adverse effects with medications prescribed/recommended today, ER and return-to-clinic precautions discussed, patient verbalized understanding.    Wallis Bamberg, [place] 03/07/23 1324

## 2023-03-07 NOTE — ED Triage Notes (Signed)
Ate a piece of hard candy last Thursday and states it hurt to swallow really badly. States that it felt like it scratched something on the way down, and the pain started directly after that. Reports continued throat pain, nausea, and reports new coughing and SOB since. Denies fever, nasal congestion, runny nose, abdominal pain, vomiting, diarrhea.

## 2023-03-07 NOTE — Discharge Instructions (Addendum)
I will update you with your chest x-ray results later. Start Augmentin for the infection sinobronchitis. Use albuterol as needed for shortness of breath. If there is no improvement in your symptoms and definitely if you are worse please report to the emergency room for more testing including ruling out a blood clot in your lungs.

## 2023-03-20 ENCOUNTER — Encounter: Payer: Self-pay | Admitting: Family Medicine

## 2023-03-20 ENCOUNTER — Ambulatory Visit: Payer: Medicaid Other | Admitting: Family Medicine

## 2023-03-20 VITALS — BP 130/80 | HR 94 | Temp 98.0°F | Ht 68.5 in | Wt 344.8 lb

## 2023-03-20 DIAGNOSIS — I10 Essential (primary) hypertension: Secondary | ICD-10-CM

## 2023-03-20 DIAGNOSIS — Z7689 Persons encountering health services in other specified circumstances: Secondary | ICD-10-CM

## 2023-03-20 DIAGNOSIS — K2100 Gastro-esophageal reflux disease with esophagitis, without bleeding: Secondary | ICD-10-CM

## 2023-03-20 DIAGNOSIS — J208 Acute bronchitis due to other specified organisms: Secondary | ICD-10-CM | POA: Diagnosis not present

## 2023-03-20 DIAGNOSIS — R053 Chronic cough: Secondary | ICD-10-CM | POA: Diagnosis not present

## 2023-03-20 DIAGNOSIS — Z6841 Body Mass Index (BMI) 40.0 and over, adult: Secondary | ICD-10-CM | POA: Diagnosis not present

## 2023-03-20 DIAGNOSIS — Z23 Encounter for immunization: Secondary | ICD-10-CM

## 2023-03-20 DIAGNOSIS — E66813 Obesity, class 3: Secondary | ICD-10-CM | POA: Diagnosis not present

## 2023-03-20 LAB — LIPID PANEL
Cholesterol: 214 mg/dL — ABNORMAL HIGH (ref 0–200)
HDL: 40.1 mg/dL (ref >39.00)
LDL Cholesterol: 159 mg/dL — ABNORMAL HIGH (ref 0–99)
NonHDL: 173.53
Total CHOL/HDL Ratio: 5
Triglycerides: 75 mg/dL (ref 0.0–149.0)
VLDL: 15 mg/dL (ref 0.0–40.0)

## 2023-03-20 LAB — COMPREHENSIVE METABOLIC PANEL
ALT: 18 U/L (ref 0–35)
AST: 13 U/L (ref 0–37)
Albumin: 4.1 g/dL (ref 3.5–5.2)
Alkaline Phosphatase: 59 U/L (ref 39–117)
BUN: 13 mg/dL (ref 6–23)
CO2: 31 meq/L (ref 19–32)
Calcium: 8.9 mg/dL (ref 8.4–10.5)
Chloride: 102 meq/L (ref 96–112)
Creatinine, Ser: 0.79 mg/dL (ref 0.40–1.20)
GFR: 95.57 mL/min (ref >60.00)
Glucose, Bld: 96 mg/dL (ref 70–99)
Potassium: 4.2 meq/L (ref 3.5–5.1)
Sodium: 138 meq/L (ref 135–145)
Total Bilirubin: 0.3 mg/dL (ref 0.2–1.2)
Total Protein: 7 g/dL (ref 6.0–8.3)

## 2023-03-20 LAB — CBC WITH DIFFERENTIAL/PLATELET
Basophils Absolute: 0 10*3/uL (ref 0.0–0.1)
Basophils Relative: 0.7 % (ref 0.0–3.0)
Eosinophils Absolute: 0.2 10*3/uL (ref 0.0–0.7)
Eosinophils Relative: 3.1 % (ref 0.0–5.0)
HCT: 39.8 % (ref 36.0–46.0)
Hemoglobin: 12.9 g/dL (ref 12.0–15.0)
Lymphocytes Relative: 35.1 % (ref 12.0–46.0)
Lymphs Abs: 2.3 10*3/uL (ref 0.7–4.0)
MCHC: 32.5 g/dL (ref 30.0–36.0)
MCV: 91.9 fL (ref 78.0–100.0)
Monocytes Absolute: 0.5 10*3/uL (ref 0.1–1.0)
Monocytes Relative: 8 % (ref 3.0–12.0)
Neutro Abs: 3.5 10*3/uL (ref 1.4–7.7)
Neutrophils Relative %: 53.1 % (ref 43.0–77.0)
Platelets: 351 10*3/uL (ref 150.0–400.0)
RBC: 4.33 Mil/uL (ref 3.87–5.11)
RDW: 13.9 % (ref 11.5–15.5)
WBC: 6.7 10*3/uL (ref 4.0–10.5)

## 2023-03-20 LAB — TSH: TSH: 1.93 u[IU]/mL (ref 0.35–5.50)

## 2023-03-20 LAB — HEMOGLOBIN A1C: Hgb A1c MFr Bld: 6.4 % (ref 4.6–6.5)

## 2023-03-20 MED ORDER — AMLODIPINE BESYLATE 10 MG PO TABS: 10.0000 mg | ORAL_TABLET | Freq: Every day | ORAL | 1 refills | Status: AC

## 2023-03-20 MED ORDER — BENZONATATE 100 MG PO CAPS: 100.0000 mg | ORAL_CAPSULE | Freq: Three times a day (TID) | ORAL | 0 refills | Status: DC | PRN

## 2023-03-20 MED ORDER — DOXYCYCLINE HYCLATE 100 MG PO TABS: 100.0000 mg | ORAL_TABLET | Freq: Two times a day (BID) | ORAL | 0 refills | Status: AC

## 2023-03-20 NOTE — Assessment & Plan Note (Signed)
Blood pressure is stable during visit. However, recommend to take Amlodipine 10mg  daily and Valsartan-Hydrochlorothiazide 160-25mg  every day. Patient reports she has not been taking medication daily and reports evaluated readings.  Ordered CMP to assess kidney function and potassium level.

## 2023-03-20 NOTE — Assessment & Plan Note (Signed)
Stable. Continue with Pantoprazole 40mg  daily.

## 2023-03-20 NOTE — Progress Notes (Signed)
New Patient Office Visit  Subjective    Patient ID: Ann Moran, female    DOB: 1986/04/13  Age: 37 y.o. MRN: [id number]  CC:  Chief Complaint  Patient presents with   Establish Care    HPI [name] presents to establish care with new provider.  Patients previous primary care provider [facility] with Dr. Hoy Register. Last visit was 03/14/2022. Also, had several telehealth visits within [facility].   Specialist: [facility]. Last appointment was Sept 2023 with a 1 year follow up.   HTN: Chronic. Patient is prescribed Amlodipine 10mg  daily and Valsartan-HCTZ 160-25mg  daily. She reports she is not taking them as prescribed. She reports she is taking them every 3 days versus as prescribed. She reports she is "just not remembering" to take them as prescribed. Patient reports she monitors her blood pressure at home. Reading at home, for example 160/90. Reports intermittent chest pain and shortness of breath, relating to be sick, usually occurs when having coughing episodes. Reports lower extremity edema daily. Reports intermittent headaches, mostly when eating a high salt diet or not drinking plenty of water. Denies dizziness and lightheadedness.   GERD: Chronic. Patient is prescribed Pantoprazole 40mg  daily. She is taking it daily as prescribed. Effective. No concerns.   Patient reports she is experiencing a cough, various between non productive and productive. When it is productive, it is thick and white. She reports she has been taking Promethazine-DM that causes her to sleep and Benzonatate with little relief. Patient was seen at [facility] on 03/07/2023 for a cough and other symptoms: malaise, fatigue, sinus pressure, and shortness of breath. She was prescribed Augmentin and IM steroids. Also, thought their was a possibility for PE and was deferred to ER. However, patient didn't go to the  hospital. X-ray was completed with minimal chronic bronchial thickening. No acute findings.   Outpatient Encounter Medications as of 03/20/2023  Medication Sig   Blood Pressure Monitoring (BLOOD PRESSURE CUFF) MISC Use to check blood pressure once daily.   cetirizine (ZYRTEC ALLERGY) 10 MG tablet Take 1 tablet (10 mg total) by mouth at bedtime for 15 days.   doxycycline (VIBRA-TABS) 100 MG tablet Take 1 tablet (100 mg total) by mouth 2 (two) times daily for 7 days.   ibuprofen (ADVIL) 600 MG tablet Take 1 tablet (600 mg total) by mouth every 8 (eight) hours as needed.   olopatadine (PATANOL) 0.1 % ophthalmic solution Place 1 drop into the left eye 2 (two) times daily.   pantoprazole (PROTONIX) 40 MG tablet TAKE 1 TABLET(40 MG) BY MOUTH DAILY   promethazine-dextromethorphan (PROMETHAZINE-DM) 6.25-15 MG/5ML syrup Take 5 mLs by mouth 3 (three) times daily as needed for cough.   valsartan-hydrochlorothiazide (DIOVAN-HCT) 160-25 MG tablet Take 1 tablet by mouth daily.   [DISCONTINUED] albuterol (VENTOLIN HFA) 108 (90 Base) MCG/ACT inhaler Inhale 1-2 puffs into the lungs every 6 (six) hours as needed for wheezing or shortness of breath.   [DISCONTINUED] benzonatate (TESSALON) 100 MG capsule Take 1-2 capsules (100-200 mg total) by mouth 3 (three) times daily as needed.   [DISCONTINUED] pseudoephedrine (SUDAFED) 60 MG tablet Take 1 tablet (60 mg total) by mouth every 8 (eight) hours as needed for congestion.   [DISCONTINUED] sertraline (ZOLOFT) 50 MG tablet TAKE 1 TABLET(50 MG) BY MOUTH DAILY   amLODipine (NORVASC) 10 MG tablet Take 1 tablet (10 mg total) by mouth daily. TAKE 1 TABLET(10 MG)  BY MOUTH DAILY. Please make PCP appointment for more refills.   benzonatate (TESSALON) 100 MG capsule Take 1-2 capsules (100-200 mg total) by mouth 3 (three) times daily as needed.   [DISCONTINUED] amitriptyline (ELAVIL) 25 MG tablet Take 1 tablet (25 mg total) by mouth at bedtime. For migraines (Patient not taking:  Reported on 03/20/2023)   [DISCONTINUED] amLODipine (NORVASC) 10 MG tablet TAKE 1 TABLET(10 MG) BY MOUTH DAILY. Please make PCP appointment for more refills. (Patient not taking: Reported on 03/20/2023)   [DISCONTINUED] amoxicillin-clavulanate (AUGMENTIN) 875-125 MG tablet Take 1 tablet by mouth 2 (two) times daily.   [DISCONTINUED] busPIRone (BUSPAR) 15 MG tablet TAKE 1 TABLET(15 MG) BY MOUTH TWICE DAILY (Patient not taking: Reported on 03/20/2023)   [DISCONTINUED] chlorhexidine (HIBICLENS) 4 % external liquid Apply topically daily as needed (wash area in shower once daily).   [DISCONTINUED] fluticasone (FLONASE) 50 MCG/ACT nasal spray Place 2 sprays into both nostrils daily.   [DISCONTINUED] hydrOXYzine (ATARAX) 25 MG tablet TAKE 1 TABLET(25 MG) BY MOUTH EVERY 8 HOURS AS NEEDED FOR ANXIETY   [DISCONTINUED] medroxyPROGESTERone (PROVERA) 5 MG tablet Take 2 tablets (10 mg total) by mouth daily. (Patient not taking: Reported on 07/20/2018)   [DISCONTINUED] meloxicam (MOBIC) 7.5 MG tablet Take 1 tablet (7.5 mg total) by mouth daily.   [DISCONTINUED] mupirocin ointment (BACTROBAN) 2 % Apply 1 Application topically 2 (two) times daily.   [DISCONTINUED] tiZANidine (ZANAFLEX) 4 MG tablet Take 1 tablet (4 mg total) by mouth at bedtime.   [DISCONTINUED] traZODone (DESYREL) 100 MG tablet Take 1 tablet (100 mg total) by mouth at bedtime as needed for sleep.   No facility-administered encounter medications on file as of 03/20/2023.    Past Medical History:  Diagnosis Date   Allergy    Anemia    Anxiety    Chlamydia    Depression    hx of meds, none currently- doing ok   Diabetes mellitus without complication (HCC)    GERD (gastroesophageal reflux disease)    Infection due to trichomonas    MRSA (methicillin resistant staph aureus) culture positive    Dec 2012   Obesity    Pregnancy induced hypertension    Urinary tract infection     Past Surgical History:  Procedure Laterality Date    CHOLECYSTECTOMY     ECTOPIC PREGNANCY SURGERY     ESOPHAGOGASTRODUODENOSCOPY  05/2019   WISDOM TOOTH EXTRACTION      Family History  Problem Relation Age of Onset   Hypertension Mother    Diabetes Mother    Asthma Father    Hypertension Father    Epilepsy Daughter    Other Daughter        Missing Chromosome   Colon cancer Paternal Aunt    Diabetes Maternal Grandmother    Stroke Maternal Grandmother    Diabetes Paternal Grandmother    Hypertension Paternal Grandmother    Colon cancer Paternal Grandmother    Esophageal cancer Neg Hx    Stomach cancer Neg Hx    Rectal cancer Neg Hx     Social History   Socioeconomic History   Marital status: Single    Spouse name: Not on file   Number of children: 1   Years of education: Not on file   Highest education level: Some college, no degree  Occupational History   Not on file  Tobacco Use   Smoking status: Every Day    Current packs/day: 0.50    Average packs/day: 0.5 packs/day for 9.0 years (  4.5 ttl pk-yrs)    Types: Cigarettes    Passive exposure: Past   Smokeless tobacco: Never   Tobacco comments:    Pt currently smoking 01/25/22 PAP  Vaping Use   Vaping status: Former   Quit date: 08/18/2015  Substance and Sexual Activity   Alcohol use: Not Currently   Drug use: Yes    Types: Marijuana    Comment: Smokes daily to treat migraines   Sexual activity: Not Currently    Birth control/protection: None  Other Topics Concern   Not on file  Social History Narrative   Not on file   Social Determinants of Health   Financial Resource Strain: Not on File (09/25/2022)   Received from [employer], [employer]    Financial Resource Strain: 0  Food Insecurity: Not on File (09/25/2022)   Received from [place], [employer] Insecurity    Food: 0  Transportation Needs: Not on File (09/25/2022)   Received from [employer], [employer] Needs    Transportation: 0  Physical Activity: Not on File (09/25/2022)    Received from [place], [place]   Physical Activity    Physical Activity: 0  Stress: Not on File (09/25/2022)   Received from [facility], [place]   Stress    Stress: 0  Social Connections: Not on File (09/25/2022)   Received from [place], [place]   Social Connections    Social Connections and Isolation: 0  Intimate Partner Violence: Unknown (10/21/2021)   Received from [employer], [facility]   HITS    Physically Hurt: Not on file    Insult or Talk Down To: Not on file    Threaten Physical Harm: Not on file    Scream or Curse: Not on file    ROS See HPI above    Objective   BP 130/80 (BP Location: Right Arm, Patient Position: Sitting, Cuff Size: Large)   Pulse 94   Temp 98 F (36.7 C) (Oral)   Ht 5' 8.5" (1.74 m)   Wt (!) 344 lb 12.8 oz (156.4 kg)   LMP 03/16/2023 (Approximate)   SpO2 95%   BMI 51.66 kg/m   Physical Exam Vitals reviewed.  Constitutional:      General: She is not in acute distress.    Appearance: Normal appearance. She is obese. She is not ill-appearing, toxic-appearing or diaphoretic.  HENT:     Head: Normocephalic and atraumatic.  Eyes:     General:        Right eye: No discharge.        Left eye: No discharge.     Conjunctiva/sclera: Conjunctivae normal.  Cardiovascular:     Rate and Rhythm: Normal rate and regular rhythm.     Heart sounds: Normal heart sounds. No murmur heard.    No friction rub. No gallop.  Pulmonary:     Effort: Pulmonary effort is normal. No respiratory distress.     Breath sounds: Normal breath sounds.     Comments: Dry cough during visit  Musculoskeletal:        General: Normal range of motion.  Skin:    General: Skin is warm and dry.  Neurological:     General: No focal deficit present.     Mental Status: She is alert and oriented to person, place, and time. Mental status is at baseline.  Psychiatric:        Mood and Affect: Mood normal.        Behavior: Behavior  normal.        Thought Content: Thought content normal.         Judgment: Judgment normal.      Assessment & Plan:  Essential hypertension Assessment & Plan: Blood pressure is stable during visit. However, recommend to take Amlodipine 10mg  daily and Valsartan-Hydrochlorothiazide 160-25mg  every day. Patient reports she has not been taking medication daily and reports evaluated readings.  Ordered CMP to assess kidney function and potassium level.  Orders: -     amLODIPine Besylate; Take 1 tablet (10 mg total) by mouth daily. TAKE 1 TABLET(10 MG) BY MOUTH DAILY. Please make PCP appointment for more refills.  Dispense: 90 tablet; Refill: 1 -     Comprehensive metabolic panel  Class 3 severe obesity due to excess calories with serious comorbidity and body mass index (BMI) of 50.0 to 59.9 in adult (HCC) -     CBC with Differential/Platelet -     Comprehensive metabolic panel -     Lipid panel -     TSH -     Hemoglobin A1c  Viral bronchitis -     Benzonatate; Take 1-2 capsules (100-200 mg total) by mouth 3 (three) times daily as needed.  Dispense: 30 capsule; Refill: 0  Chronic cough -     Benzonatate; Take 1-2 capsules (100-200 mg total) by mouth 3 (three) times daily as needed.  Dispense: 30 capsule; Refill: 0 -     Doxycycline Hyclate; Take 1 tablet (100 mg total) by mouth 2 (two) times daily for 7 days.  Dispense: 14 tablet; Refill: 0  Needs flu shot -     Flu vaccine trivalent PF, 6mos and older(Flulaval,Afluria,Fluarix,Fluzone)  Encounter to establish care   1.Review health maintenance:  -Covid booster: Declines  -Influenza vaccine: Ordered and administered  2. Recommend to stop taking Sudafed. This can be causing more harm than benefits. Recommend over the counter Mucinex. Refilled Benzonatate as needed for cough. Also, prescribed Doxycycline 100mg  tablet, take 1 tablet twice a day for 7 days.  3.Recommend to make an appointment with [facility]. Overdue for your annual visit.  4. Ordered labs (CBC, CMP, TSH,  A1c and Lipids) based on BMI-obesity. Patient is fasting.  5.Will follow up based on lab results.   Zandra Abts, NP

## 2023-03-20 NOTE — Patient Instructions (Addendum)
-  It was nice to meet you and look forward to taking of you. -Recommend to make an appointment with [facility]. You are overdue for your annual visit.  -Blood pressure is stable during visit. However, recommend to take Amlodipine 10mg  daily and Valsartan-Hydrochlorothiazide 160-25mg  every day.  -Ordered labs. Office will call with lab results and you will see them on MyChart. We will follow up based on lab results. -Recommend to stop taking Sudafed. This can be causing more harm than benefits. Recommend over the counter Mucinex. Refilled Benzonatate as needed for cough. Also, prescribed Doxycycline 100mg  tablet, take 1 tablet twice a day for 7 days.  -Influenza vaccine administered today.  -Continue with all other prescribed medications.  -Happy Thanksgiving!

## 2023-04-05 ENCOUNTER — Telehealth: Payer: Medicaid Other | Admitting: Family Medicine

## 2023-04-05 ENCOUNTER — Telehealth: Payer: Medicaid Other | Admitting: Nurse Practitioner

## 2023-04-05 DIAGNOSIS — K056 Periodontal disease, unspecified: Secondary | ICD-10-CM

## 2023-04-05 DIAGNOSIS — M62838 Other muscle spasm: Secondary | ICD-10-CM | POA: Diagnosis not present

## 2023-04-05 DIAGNOSIS — J40 Bronchitis, not specified as acute or chronic: Secondary | ICD-10-CM | POA: Diagnosis not present

## 2023-04-05 MED ORDER — CHLORHEXIDINE GLUCONATE 0.12 % MT SOLN: 15.0000 mL | Freq: Two times a day (BID) | OROMUCOSAL | 0 refills | Status: DC

## 2023-04-05 MED ORDER — CYCLOBENZAPRINE HCL 5 MG PO TABS: 5.0000 mg | ORAL_TABLET | Freq: Three times a day (TID) | ORAL | 1 refills | Status: DC | PRN

## 2023-04-05 MED ORDER — ALBUTEROL SULFATE HFA 108 (90 BASE) MCG/ACT IN AERS: 2.0000 | INHALATION_SPRAY | Freq: Four times a day (QID) | RESPIRATORY_TRACT | 0 refills | Status: DC | PRN

## 2023-04-05 MED ORDER — AZITHROMYCIN 250 MG PO TABS: ORAL_TABLET | ORAL | 0 refills | Status: AC

## 2023-04-05 MED ORDER — GUAIFENESIN ER 600 MG PO TB12: 1200.0000 mg | ORAL_TABLET | Freq: Two times a day (BID) | ORAL | 0 refills | Status: DC | PRN
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
## 2023-04-05 NOTE — Progress Notes (Signed)
E-Visit for Mouth Sore  We are sorry that you are not feeling well.  Here is how we plan to help!  Based on what you have shared with me, it appears that you do have mouth sore(s).     The following medications should decrease the discomfort and help with healing. Chlorhexidine mouthwash daily for 3 days   While mostly harmless, mouth sore can be extremely uncomfortable and may make it difficult to eat, drink, and brush your teeth.  You may have more than 1 ulcer and they can vary and change in size.  While the exact causes are unknown, some common causes and factors that may aggravate mouth ulcers include: Genetics - Sometimes mouth ulcers run in families High alcohol intake Acidic foods such as citrus fruits like pineapple, grapefruit, orange fruits/juices, may aggravate mouth ulcers Other foods high in acidity or spice such as coffee, chocolate, chips, pretzels, eggs, nuts, cheese Quitting smoking Injury caused by biting the tongue or inside of the cheek Diet lacking in B-12, zinc, folic acid or iron Female hormone shifts with menstruation Excessive fatigue, emotional stress or anxiety Prevention: Talk to your doctor if you are taking meds that are known to cause mouth ulcers such as:   Anti-inflammatory drugs (for example Ibuprofen, Naproxen sodium), pain killers, Beta blockers, Oral nicotine replacement drugs, Some street drugs (heroin).   Avoid allowing any tablets to dissolve in your mouth that are meant to swallowed whole Avoid foods/drinks that trigger or worsen symptoms Keep your mouth clean with daily brushing and flossing  Home Care: The goal with treatment is to ease the pain where ulcers occur and help them heal as quickly as possible.  There is no medical treatment to prevent mouth ulcers from coming back or recurring.  Avoid spicy and acidic foods Eat soft foods and avoid rough, crunchy foods Avoid chewing gum Do not use toothpaste that contains sodium lauryl  sulphite Use a straw to drink which helps avoid liquids toughing the ulcers near the front of your mouth Use a very soft toothbrush If you have dentures or dental hardware that you feel is not fitting well or contributing to his, please see your dentist. Use saltwater mouthwash which helps healing. Dissolve a  teaspoon of salt in a glass of warm water. Swish around your mouth and spit it out. This can be used as needed if it is soothing.   GET HELP RIGHT AWAY IF: Persistent ulcers require checking IN PERSON (face to face). Any mouth lesion lasting longer than a month should be seen by your DENTIST as soon as possible for evaluation for possible oral cancer. If you have a non-painful ulcer in 1 or more areas of your mouth Ulcers that are spreading, are very large or particularly painful Ulcers last longer than one week without improving on treatment If you develop a fever, swollen glands and begin to feel unwell Ulcers that developed after starting a new medication MAKE SURE YOU: Understand these instructions. Will watch your condition. Will get help right away if you are not doing well or get worse.  Thank you for choosing an e-visit.  Your e-visit answers were reviewed by a board certified advanced clinical practitioner to complete your personal care plan. Depending upon the condition, your plan could have included both over the counter or prescription medications.  Please review your pharmacy choice. Make sure the pharmacy is open so you can pick up prescription now. If there is a problem, you may contact your provider  through [employer] and have the prescription routed to another pharmacy.  Your safety is important to [place]. If you have drug allergies check your prescription carefully.   For the next 24 hours you can use MyChart to ask questions about today's visit, request a non-urgent call back, or ask for a work or school excuse. You will get an email in the next two days asking  about your experience. I hope that your e-visit has been valuable and will speed your recovery.  I provided 5 minutes of non face-to-face time during this encounter for chart review, medication and order placement, as well as and documentation.

## 2023-04-24 ENCOUNTER — Telehealth: Payer: Medicaid Other | Admitting: Family Medicine

## 2023-04-24 DIAGNOSIS — B9689 Other specified bacterial agents as the cause of diseases classified elsewhere: Secondary | ICD-10-CM | POA: Diagnosis not present

## 2023-04-24 DIAGNOSIS — H6691 Otitis media, unspecified, right ear: Secondary | ICD-10-CM | POA: Diagnosis not present

## 2023-04-24 DIAGNOSIS — J019 Acute sinusitis, unspecified: Secondary | ICD-10-CM

## 2023-04-24 MED ORDER — AMOXICILLIN-POT CLAVULANATE 875-125 MG PO TABS
1.0000 | ORAL_TABLET | Freq: Two times a day (BID) | ORAL | 0 refills | Status: DC
Start: 2023-04-24 — End: 2023-08-07

## 2023-04-24 NOTE — Progress Notes (Signed)

## 2023-04-27 ENCOUNTER — Other Ambulatory Visit: Payer: Self-pay | Admitting: Nurse Practitioner

## 2023-04-27 DIAGNOSIS — J40 Bronchitis, not specified as acute or chronic: Secondary | ICD-10-CM

## 2023-05-10 ENCOUNTER — Telehealth: Payer: Medicaid Other | Admitting: Physician Assistant

## 2023-05-10 DIAGNOSIS — M549 Dorsalgia, unspecified: Secondary | ICD-10-CM | POA: Diagnosis not present

## 2023-05-10 MED ORDER — NAPROXEN 500 MG PO TABS: 500.0000 mg | ORAL_TABLET | Freq: Two times a day (BID) | ORAL | 0 refills | Status: DC

## 2023-05-10 MED ORDER — CYCLOBENZAPRINE HCL 10 MG PO TABS: 5.0000 mg | ORAL_TABLET | Freq: Three times a day (TID) | ORAL | 0 refills | Status: DC | PRN

## 2023-05-10 NOTE — Progress Notes (Signed)

## 2023-05-25 ENCOUNTER — Telehealth: Payer: Medicaid Other | Admitting: Family Medicine

## 2023-05-25 DIAGNOSIS — M549 Dorsalgia, unspecified: Secondary | ICD-10-CM

## 2023-05-25 NOTE — Progress Notes (Signed)
  Because Ms. Hildred Laser, I feel your condition warrants further evaluation and I recommend that you be seen in a face-to-face visit.   NOTE: There will be NO CHARGE for this E-Visit   If you are having a true medical emergency, please call 911.     For an urgent face to face visit, [facility] has multiple urgent care centers for your convenience.  Click the link below for the full list of locations and hours, walk-in wait times, appointment scheduling options and driving directions:  Urgent Care - [place], [place], [place], [place], [place], [place]  [place]     Your MyChart E-visit questionnaire answers were reviewed by a board certified advanced clinical practitioner to complete your personal care plan based on your specific symptoms.    Thank you for using e-Visits.

## 2023-06-09 ENCOUNTER — Telehealth: Payer: Medicaid Other | Admitting: Nurse Practitioner

## 2023-06-09 DIAGNOSIS — M549 Dorsalgia, unspecified: Secondary | ICD-10-CM

## 2023-06-09 DIAGNOSIS — J069 Acute upper respiratory infection, unspecified: Secondary | ICD-10-CM | POA: Diagnosis not present

## 2023-06-09 MED ORDER — IPRATROPIUM BROMIDE 0.03 % NA SOLN: 2.0000 | Freq: Two times a day (BID) | NASAL | 0 refills | Status: DC

## 2023-06-09 MED ORDER — CYCLOBENZAPRINE HCL 10 MG PO TABS: 5.0000 mg | ORAL_TABLET | Freq: Three times a day (TID) | ORAL | 0 refills | Status: DC | PRN

## 2023-06-09 MED ORDER — IBUPROFEN 600 MG PO TABS: 600.0000 mg | ORAL_TABLET | Freq: Three times a day (TID) | ORAL | 0 refills | Status: DC | PRN

## 2023-06-09 MED ORDER — PSEUDOEPH-BROMPHEN-DM 30-2-10 MG/5ML PO SYRP: 5.0000 mL | ORAL_SOLUTION | Freq: Four times a day (QID) | ORAL | 0 refills | Status: DC | PRN

## 2023-06-09 NOTE — Progress Notes (Signed)
 Unfortunately there is not any treatment we could provide for the loss of taste sense. I do recommend you take an over the counter flu/covid test that you can pick up from any pharmacy. You can also take tylenol and alternate with motrin for pain. I will refill your motrin and flexeril for your neck pain.   E-Visit for Upper Respiratory Infection   We are sorry you are not feeling well.  Here is how we plan to help!  Based on what you have shared with me, it looks like you may have a viral upper respiratory infection.  Upper respiratory infections are caused by a large number of viruses; however, rhinovirus is the most common cause.   Symptoms vary from person to person, with common symptoms including sore throat, cough, fatigue or lack of energy and feeling of general discomfort.  A low-grade fever of up to 100.4 may present, but is often uncommon.  Symptoms vary however, and are closely related to a person's age or underlying illnesses.  The most common symptoms associated with an upper respiratory infection are nasal discharge or congestion, cough, sneezing, headache and pressure in the ears and face.  These symptoms usually persist for about 3 to 10 days, but can last up to 2 weeks.  It is important to know that upper respiratory infections do not cause serious illness or complications in most cases.    Upper respiratory infections can be transmitted from person to person, with the most common method of transmission being a person's hands.  The virus is able to live on the skin and can infect other persons for up to 2 hours after direct contact.  Also, these can be transmitted when someone coughs or sneezes; thus, it is important to cover the mouth to reduce this risk.  To keep the spread of the illness at bay, good hand hygiene is very important.  This is an infection that is most likely caused by a virus. There are no specific treatments other than to help you with the symptoms until the infection  runs its course.  We are sorry you are not feeling well.  Here is how we plan to help!   For nasal congestion, you may use an oral decongestants such as Mucinex D or if you have glaucoma or high blood pressure use plain Mucinex.  Saline nasal spray or nasal drops can help and can safely be used as often as needed for congestion.  For your congestion, I have prescribed Ipratropium Bromide nasal spray 0.03% two sprays in each nostril 2-3 times a day  If you do not have a history of heart disease, hypertension, diabetes or thyroid disease, prostate/bladder issues or glaucoma, you may also use Sudafed to treat nasal congestion.  It is highly recommended that you consult with a pharmacist or your primary care physician to ensure this medication is safe for you to take.     If you have a cough, you may use cough suppressants such as Delsym and Robitussin.  If you have glaucoma or high blood pressure, you can also use Coricidin HBP.   For cough I have prescribed for you bromfed cough syrup.   If you have a sore or scratchy throat, use a saltwater gargle-  to  teaspoon of salt dissolved in a 4-ounce to 8-ounce glass of warm water.  Gargle the solution for approximately 15-30 seconds and then spit.  It is important not to swallow the solution.  You can also use throat lozenges/cough  drops and Chloraseptic spray to help with throat pain or discomfort.  Warm or cold liquids can also be helpful in relieving throat pain.  For headache, pain or general discomfort, you can use Ibuprofen or Tylenol as directed.   Some authorities believe that zinc sprays or the use of Echinacea may shorten the course of your symptoms.   HOME CARE Only take medications as instructed by your medical team. Be sure to drink plenty of fluids. Water is fine as well as fruit juices, sodas and electrolyte beverages. You may want to stay away from caffeine or alcohol. If you are nauseated, try taking small sips of liquids. How do you  know if you are getting enough fluid? Your urine should be a pale yellow or almost colorless. Get rest. Taking a steamy shower or using a humidifier may help nasal congestion and ease sore throat pain. You can place a towel over your head and breathe in the steam from hot water coming from a faucet. Using a saline nasal spray works much the same way. Cough drops, hard candies and sore throat lozenges may ease your cough. Avoid close contacts especially the very young and the elderly Cover your mouth if you cough or sneeze Always remember to wash your hands.   GET HELP RIGHT AWAY IF: You develop worsening fever. If your symptoms do not improve within 10 days You develop yellow or green discharge from your nose over 3 days. You have coughing fits You develop a severe head ache or visual changes. You develop shortness of breath, difficulty breathing or start having chest pain Your symptoms persist after you have completed your treatment plan  MAKE SURE YOU  Understand these instructions. Will watch your condition. Will get help right away if you are not doing well or get worse.  Thank you for choosing an e-visit.  Your e-visit answers were reviewed by a board certified advanced clinical practitioner to complete your personal care plan. Depending upon the condition, your plan could have included both over the counter or prescription medications.  Please review your pharmacy choice. Make sure the pharmacy is open so you can pick up prescription now. If there is a problem, you may contact your provider through [employer] and have the prescription routed to another pharmacy.  Your safety is important to [place]. If you have drug allergies check your prescription carefully.   For the next 24 hours you can use MyChart to ask questions about today's visit, request a non-urgent call back, or ask for a work or school excuse. You will get an email in the next two days asking about your experience.  I hope that your e-visit has been valuable and will speed your recovery.

## 2023-06-09 NOTE — Progress Notes (Signed)
 I have spent 5 minutes in review of e-visit questionnaire, review and updating patient chart, medical decision making and response to patient.   Claiborne Rigg, NP

## 2023-06-21 ENCOUNTER — Telehealth: Payer: Medicaid Other | Admitting: Physician Assistant

## 2023-06-21 DIAGNOSIS — L710 Perioral dermatitis: Secondary | ICD-10-CM

## 2023-06-21 MED ORDER — METRONIDAZOLE 0.75 % EX CREA: TOPICAL_CREAM | Freq: Two times a day (BID) | CUTANEOUS | 0 refills | Status: DC

## 2023-06-21 NOTE — Progress Notes (Signed)
 E Visit for Rash  We are sorry that you are not feeling well. Here is how we plan to help!  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  I have prescribed Topical Metronidazole cream to apply to affected areas twice daily.   Avoid use of topical steroids on the face (hydrocortisone).  HOME CARE:  Take cool showers and avoid direct sunlight. Apply cool compress or wet dressings. Take a bath in an oatmeal bath.  Sprinkle content of one Aveeno packet under running faucet with comfortably warm water.  Bathe for 15-20 minutes, 1-2 times daily.  Pat dry with a towel. Do not rub the rash. Use hydrocortisone cream. Take an antihistamine like Benadryl for widespread rashes that itch.  The adult dose of Benadryl is 25-50 mg by mouth 4 times daily. Caution:  This type of medication may cause sleepiness.  Do not drink alcohol, drive, or operate dangerous machinery while taking antihistamines.  Do not take these medications if you have prostate enlargement.  Read package instructions thoroughly on all medications that you take.  GET HELP RIGHT AWAY IF:  Symptoms don't go away after treatment. Severe itching that persists. If you rash spreads or swells. If you rash begins to smell. If it blisters and opens or develops a yellow-brown crust. You develop a fever. You have a sore throat. You become short of breath.  MAKE SURE YOU:  Understand these instructions. Will watch your condition. Will get help right away if you are not doing well or get worse.  Thank you for choosing an e-visit.  Your e-visit answers were reviewed by a board certified advanced clinical practitioner to complete your personal care plan. Depending upon the condition, your plan could have included both over the counter or prescription medications.  Please review your pharmacy choice. Make sure the pharmacy is open so you can pick up prescription now. If there is a  problem, you may contact your provider through [employer] and have the prescription routed to another pharmacy.  Your safety is important to [place]. If you have drug allergies check your prescription carefully.   For the next 24 hours you can use MyChart to ask questions about today's visit, request a non-urgent call back, or ask for a work or school excuse. You will get an email in the next two days asking about your experience. I hope that your e-visit has been valuable and will speed your recovery.   I have spent 5 minutes in review of e-visit questionnaire, review and updating patient chart, medical decision making and response to patient.   [name], PA-C

## 2023-06-23 ENCOUNTER — Telehealth: Admitting: Physician Assistant

## 2023-06-23 DIAGNOSIS — L309 Dermatitis, unspecified: Secondary | ICD-10-CM

## 2023-06-23 MED ORDER — TRIAMCINOLONE ACETONIDE 0.5 % EX OINT: 1.0000 | TOPICAL_OINTMENT | Freq: Two times a day (BID) | CUTANEOUS | 0 refills | Status: DC

## 2023-06-23 NOTE — Patient Instructions (Signed)
 Raye Sorrow, thank you for joining [name], PA-C for today's virtual visit.  While this provider is not your primary care provider (PCP), if your PCP is located in our provider database this encounter information will be shared with them immediately following your visit.   A [facility] MyChart account gives you access to today's visit and all your visits, tests, and labs performed at [facility] " click here if you don't have a [facility] MyChart account or go to [email or web address]  Consent: (Patient) Ann Moran provided verbal consent for this virtual visit at the beginning of the encounter.  Current Medications:  Current Outpatient Medications:    triamcinolone ointment (KENALOG) 0.5 %, Apply 1 Application topically 2 (two) times daily., Disp: 30 g, Rfl: 0   albuterol (VENTOLIN HFA) 108 (90 Base) MCG/ACT inhaler, Inhale 2 puffs into the lungs every 6 (six) hours as needed for wheezing or shortness of breath., Disp: 8 g, Rfl: 0   amLODipine (NORVASC) 10 MG tablet, Take 1 tablet (10 mg total) by mouth daily. TAKE 1 TABLET(10 MG) BY MOUTH DAILY. Please make PCP appointment for more refills., Disp: 90 tablet, Rfl: 1   amoxicillin-clavulanate (AUGMENTIN) 875-125 MG tablet, Take 1 tablet by mouth 2 (two) times daily., Disp: 20 tablet, Rfl: 0   benzonatate (TESSALON) 100 MG capsule, Take 1-2 capsules (100-200 mg total) by mouth 3 (three) times daily as needed., Disp: 30 capsule, Rfl: 0   Blood Pressure Monitoring (BLOOD PRESSURE CUFF) MISC, Use to check blood pressure once daily., Disp: 1 each, Rfl: 0   brompheniramine-pseudoephedrine-DM 30-2-10 MG/5ML syrup, Take 5 mLs by mouth 4 (four) times daily as needed., Disp: 240 mL, Rfl: 0   cetirizine (ZYRTEC ALLERGY) 10 MG tablet, Take 1 tablet (10 mg total) by mouth at bedtime for 15 days., Disp: 15 tablet, Rfl: 0   chlorhexidine (PERIDEX) 0.12 % solution, Use as directed 15 mLs in the mouth or throat 2 (two)  times daily., Disp: 120 mL, Rfl: 0   cyclobenzaprine (FLEXERIL) 10 MG tablet, Take 0.5-1 tablets (5-10 mg total) by mouth 3 (three) times daily as needed., Disp: 30 tablet, Rfl: 0   guaiFENesin (MUCINEX) 600 MG 12 hr tablet, Take 2 tablets (1,200 mg total) by mouth 2 (two) times daily as needed for cough or to loosen phlegm., Disp: 120 tablet, Rfl: 0   ibuprofen (ADVIL) 600 MG tablet, Take 1 tablet (600 mg total) by mouth every 8 (eight) hours as needed., Disp: 30 tablet, Rfl: 0   ipratropium (ATROVENT) 0.03 % nasal spray, Place 2 sprays into both nostrils every 12 (twelve) hours., Disp: 30 mL, Rfl: 0   metroNIDAZOLE (METROCREAM) 0.75 % cream, Apply topically 2 (two) times daily., Disp: 45 g, Rfl: 0   naproxen (NAPROSYN) 500 MG tablet, Take 1 tablet (500 mg total) by mouth 2 (two) times daily with a meal., Disp: 30 tablet, Rfl: 0   olopatadine (PATANOL) 0.1 % ophthalmic solution, Place 1 drop into the left eye 2 (two) times daily., Disp: 5 mL, Rfl: 0   pantoprazole (PROTONIX) 40 MG tablet, TAKE 1 TABLET(40 MG) BY MOUTH DAILY, Disp: 90 tablet, Rfl: 1   promethazine-dextromethorphan (PROMETHAZINE-DM) 6.25-15 MG/5ML syrup, Take 5 mLs by mouth 3 (three) times daily as needed for cough., Disp: 200 mL, Rfl: 0   valsartan-hydrochlorothiazide (DIOVAN-HCT) 160-25 MG tablet, Take 1 tablet by mouth daily., Disp: 90 tablet, Rfl: 3   Medications ordered in this encounter:  Meds ordered this encounter  Medications  triamcinolone ointment (KENALOG) 0.5 %    Sig: Apply 1 Application topically 2 (two) times daily.    Dispense:  30 g    Refill:  0    Is able to tolerate    Supervising Provider:   Merrilee Jansky [[id number]]     *If you need refills on other medications prior to your next appointment, please contact your pharmacy*  Follow-Up: Call back or seek an in-person evaluation if the symptoms worsen or if the condition fails to improve as anticipated.  [facility] [phone]  Other Instructions Contact Dermatitis Dermatitis is redness, soreness, and swelling (inflammation) of the skin. Contact dermatitis is a reaction to certain substances that touch the skin. There are two types of this condition: Irritant contact dermatitis. This is the most common type. It happens when something irritates your skin, such as when your hands get dry from washing them too often with soap. You can get this type of reaction even if you have not been exposed to the irritant before. Allergic contact dermatitis. This type is caused by a substance that you are allergic to, such as poison ivy. It occurs when you have been exposed to the substance (allergen) and form a sensitivity to it. In some cases, the reaction may start soon after your first exposure to the allergen. In other cases, it may not start until you are exposed to the allergen again. It may then occur every time you are exposed to the allergen in the future. What are the causes? Irritant contact dermatitis is often caused by exposure to: Makeup. Soaps, detergents, and bleaches. Acids. Metal salts, such as nickel. Allergic contact dermatitis is often caused by exposure to: Poisonous plants. Chemicals. Jewelry. Latex. Medicines. Preservatives in products, such as clothes. What increases the risk? You are more likely to get this condition if you have: A job that exposes you to irritants or allergens. Certain medical conditions. These include asthma and eczema. What are the signs or symptoms? Symptoms of this condition may occur in any place on your body that has been touched by the irritant. Symptoms include: Dryness, flaking, or cracking. Redness. Itching. Pain or a burning feeling. Blisters. Drainage of small amounts of blood or clear fluid from skin cracks. With allergic contact dermatitis, there may also be swelling in areas such as the eyelids, mouth, or genitals. How is this diagnosed? This condition  is diagnosed with a medical history and physical exam. A patch skin test may be done to help figure out the cause. If the condition is related to your job, you may need to see an expert in health problems in the workplace (occupational medicine specialist). How is this treated? This condition is treated by staying away from the cause of the reaction and protecting your skin from further contact. Treatment may also include: Steroid creams or ointments. Steroid medicines may need be taken by mouth (orally) in more severe cases. Antibiotics or medicines applied to the skin to kill bacteria (antibacterial ointments). These may be needed if a skin infection is present. Antihistamines. These may be taken orally or put on as a lotion to ease itching. A bandage (dressing). Follow these instructions at home: Skin care Moisturize your skin as needed. Put cool, wet cloths (cool compresses) on the affected areas. Try applying baking soda paste to your skin. Stir water into baking soda until it has the consistency of a paste. Do not scratch your skin. Avoid friction to the affected area. Avoid  the use of soaps, perfumes, and dyes. Check the affected areas every day for signs of infection. Check for: More redness, swelling, or pain. More fluid or blood. Warmth. Pus or a bad smell. Medicines Take or apply over-the-counter and prescription medicines only as told by your health care provider. If you were prescribed antibiotics, take or apply them as told by your health care provider. Do not stop using the antibiotic even if you start to feel better. Bathing Try taking a bath with: Epsom salts. Follow the instructions on the packaging. You can get these at your local pharmacy or grocery store. Baking soda. Pour a small amount into the bath as told by your health care provider. Colloidal oatmeal. Follow the instructions on the packaging. You can get this at your local pharmacy or grocery store. Bathe less  often. This may mean bathing every other day. Bathe in lukewarm water. Avoid using hot water. Bandage care If you were given a dressing, change it as told by your health care provider. Wash your hands with soap and water for at least 20 seconds before and after you change your dressing. If soap and water are not available, use hand sanitizer. General instructions Avoid the substance that caused your reaction. If you do not know what caused it, keep a journal to try to track what caused it. Write down: What you eat and drink. What cosmetics you use. What you wear in the affected area. This includes jewelry. Contact a health care provider if: Your condition does not get better with treatment. Your condition gets worse. You have any signs of infection. You have a fever. You have new symptoms. Your bone or joint under the affected area becomes painful after the skin has healed. Get help right away if: You notice red streaks coming from the affected area. The affected area turns darker. You have trouble breathing. This information is not intended to replace advice given to you by your health care provider. Make sure you discuss any questions you have with your health care provider. Document Revised: 10/13/2021 Document Reviewed: 10/13/2021 Elsevier Patient Education  2024 [employer]   If you have been instructed to have an in-person evaluation today at a local Urgent Care facility, please use the link below. It will take you to a list of all of our available [facility], including address, phone number and hours of operation. Please do not delay care.  [facility]  If you or a family member do not have a primary care provider, use the link below to schedule a visit and establish care. When you choose a [facility] primary care physician or advanced practice provider, you gain a long-term partner in health. Find a Primary Care Provider  Learn more about [facility]'s in-office and virtual care options: [facility] - Get Care Now

## 2023-06-23 NOTE — Progress Notes (Signed)
 Virtual Visit Consent   Ann Moran, you are scheduled for a virtual visit with a [facility] provider today. Just as with appointments in the office, your consent must be obtained to participate. Your consent will be active for this visit and any virtual visit you may have with one of our providers in the next 365 days. If you have a MyChart account, a copy of this consent can be sent to you electronically.  As this is a virtual visit, video technology does not allow for your provider to perform a traditional examination. This may limit your provider's ability to fully assess your condition. If your provider identifies any concerns that need to be evaluated in person or the need to arrange testing (such as labs, EKG, etc.), we will make arrangements to do so. Although advances in technology are sophisticated, we cannot ensure that it will always work on either your end or our end. If the connection with a video visit is poor, the visit may have to be switched to a telephone visit. With either a video or telephone visit, we are not always able to ensure that we have a secure connection.  By engaging in this virtual visit, you consent to the provision of healthcare and authorize for your insurance to be billed (if applicable) for the services provided during this visit. Depending on your insurance coverage, you may receive a charge related to this service.  I need to obtain your verbal consent now. Are you willing to proceed with your visit today? Ann Moran has provided verbal consent on 06/23/2023 for a virtual visit (video or telephone). Ann Jaffe, PA-C  Date: 06/23/2023 11:09 AM   Virtual Visit via Video Note   I, Ann Moran, connected with  Ann Moran  ([id number], 05/13/1985) on 06/23/23 at 11:00 AM EST by a video-enabled telemedicine application and verified that I am speaking with the correct person using two identifiers.  Location: Patient: Virtual Visit  Location Patient: Home Provider: Virtual Visit Location Provider: Home Office   I discussed the limitations of evaluation and management by telemedicine and the availability of in person appointments. The patient expressed understanding and agreed to proceed.    History of Present Illness: Ann Moran is a 38 y.o. who identifies as a female who was assigned female at birth presents with a new onset facial rash. The rash, which extends from the eyes to the neck, began with itching and has progressed to burning. They have noticed a few open sores, but deny any purulent discharge. They have not started any new medications, but have recently changed their detergent and face wash. They were previously evaluated via e-visit and prescribed metronidazole cream, which they discontinued due to concerns it was exacerbating the burning sensation. They have been managing the itching with oral Benadryl and the burning with ice packs.    Problems:  Patient Active Problem List   Diagnosis Date Noted   Sinusitis, acute 01/31/2022   Migraine 01/31/2022   Anxiety and depression 01/31/2022   OSA (obstructive sleep apnea) 01/25/2022   Smoking 02/23/2021   Patellar tendinitis of both knees 01/27/2021   Pain in joint involving right ankle and foot 01/27/2021   Thoracic back pain 12/23/2020   Right elbow pain 12/23/2020   Chronic pain of right knee 12/23/2020   Ankle fracture, left 08/09/2020   Dysphagia 05/20/2019   Encounter for other preprocedural examination 05/20/2019   PCOS (polycystic ovarian syndrome) 02/24/2019   Neck pain  02/24/2019   Morbid obesity with body mass index (BMI) of 45.0 to 49.9 in adult [facility]) 02/24/2019   Essential hypertension 02/24/2019   Gastroesophageal reflux disease 02/24/2019   Irregular menstrual cycle 01/14/2019   Abnormal uterine bleeding 01/14/2019   Chronic cough 01/14/2019    Allergies:  Allergies  Allergen Reactions   Prednisone Swelling    Had neck and  shoulder swelling following taking   Medications:  Current Outpatient Medications:    triamcinolone ointment (KENALOG) 0.5 %, Apply 1 Application topically 2 (two) times daily., Disp: 30 g, Rfl: 0   albuterol (VENTOLIN HFA) 108 (90 Base) MCG/ACT inhaler, Inhale 2 puffs into the lungs every 6 (six) hours as needed for wheezing or shortness of breath., Disp: 8 g, Rfl: 0   amLODipine (NORVASC) 10 MG tablet, Take 1 tablet (10 mg total) by mouth daily. TAKE 1 TABLET(10 MG) BY MOUTH DAILY. Please make PCP appointment for more refills., Disp: 90 tablet, Rfl: 1   amoxicillin-clavulanate (AUGMENTIN) 875-125 MG tablet, Take 1 tablet by mouth 2 (two) times daily., Disp: 20 tablet, Rfl: 0   benzonatate (TESSALON) 100 MG capsule, Take 1-2 capsules (100-200 mg total) by mouth 3 (three) times daily as needed., Disp: 30 capsule, Rfl: 0   Blood Pressure Monitoring (BLOOD PRESSURE CUFF) MISC, Use to check blood pressure once daily., Disp: 1 each, Rfl: 0   brompheniramine-pseudoephedrine-DM 30-2-10 MG/5ML syrup, Take 5 mLs by mouth 4 (four) times daily as needed., Disp: 240 mL, Rfl: 0   cetirizine (ZYRTEC ALLERGY) 10 MG tablet, Take 1 tablet (10 mg total) by mouth at bedtime for 15 days., Disp: 15 tablet, Rfl: 0   chlorhexidine (PERIDEX) 0.12 % solution, Use as directed 15 mLs in the mouth or throat 2 (two) times daily., Disp: 120 mL, Rfl: 0   cyclobenzaprine (FLEXERIL) 10 MG tablet, Take 0.5-1 tablets (5-10 mg total) by mouth 3 (three) times daily as needed., Disp: 30 tablet, Rfl: 0   guaiFENesin (MUCINEX) 600 MG 12 hr tablet, Take 2 tablets (1,200 mg total) by mouth 2 (two) times daily as needed for cough or to loosen phlegm., Disp: 120 tablet, Rfl: 0   ibuprofen (ADVIL) 600 MG tablet, Take 1 tablet (600 mg total) by mouth every 8 (eight) hours as needed., Disp: 30 tablet, Rfl: 0   ipratropium (ATROVENT) 0.03 % nasal spray, Place 2 sprays into both nostrils every 12 (twelve) hours., Disp: 30 mL, Rfl: 0    metroNIDAZOLE (METROCREAM) 0.75 % cream, Apply topically 2 (two) times daily., Disp: 45 g, Rfl: 0   naproxen (NAPROSYN) 500 MG tablet, Take 1 tablet (500 mg total) by mouth 2 (two) times daily with a meal., Disp: 30 tablet, Rfl: 0   olopatadine (PATANOL) 0.1 % ophthalmic solution, Place 1 drop into the left eye 2 (two) times daily., Disp: 5 mL, Rfl: 0   pantoprazole (PROTONIX) 40 MG tablet, TAKE 1 TABLET(40 MG) BY MOUTH DAILY, Disp: 90 tablet, Rfl: 1   promethazine-dextromethorphan (PROMETHAZINE-DM) 6.25-15 MG/5ML syrup, Take 5 mLs by mouth 3 (three) times daily as needed for cough., Disp: 200 mL, Rfl: 0   valsartan-hydrochlorothiazide (DIOVAN-HCT) 160-25 MG tablet, Take 1 tablet by mouth daily., Disp: 90 tablet, Rfl: 3  Observations/Objective: Patient is well-developed, well-nourished in no acute distress.  Resting comfortably  at home.  Head is normocephalic, atraumatic.  No labored breathing.  Speech is clear and coherent with logical content.  Patient is alert and oriented at baseline.    Assessment and Plan: 1. Dermatitis (Primary) -  triamcinolone ointment (KENALOG) 0.5 %; Apply 1 Application topically 2 (two) times daily.  Dispense: 30 g; Refill: 0   Possible Contact Dermatitis Facial burning and itching with a few open sores. No pus. Recent introduction of new detergent and face wash. Initial treatment with metronidazole cream did not improve symptoms and may have exacerbated them. -Discontinue metronidazole cream. -Start topical steroid cream. Is able to tolerate per patient.  -Continue Benadryl for itching. -Continue ice packs for burning. -Discontinue new detergent and face wash.   Follow Up Instructions: I discussed the assessment and treatment plan with the patient. The patient was provided an opportunity to ask questions and all were answered. The patient agreed with the plan and demonstrated an understanding of the instructions.  A copy of instructions were sent to the  patient via MyChart unless otherwise noted below.     The patient was advised to call back or seek an in-person evaluation if the symptoms worsen or if the condition fails to improve as anticipated.    Kasandra Knudsen Mayers, PA-C

## 2023-07-01 ENCOUNTER — Other Ambulatory Visit: Payer: Self-pay | Admitting: Nurse Practitioner

## 2023-07-01 DIAGNOSIS — J069 Acute upper respiratory infection, unspecified: Secondary | ICD-10-CM

## 2023-07-12 ENCOUNTER — Telehealth: Admitting: Physician Assistant

## 2023-07-12 DIAGNOSIS — R051 Acute cough: Secondary | ICD-10-CM

## 2023-07-12 DIAGNOSIS — M546 Pain in thoracic spine: Secondary | ICD-10-CM

## 2023-07-12 MED ORDER — ALBUTEROL SULFATE HFA 108 (90 BASE) MCG/ACT IN AERS: 1.0000 | INHALATION_SPRAY | Freq: Four times a day (QID) | RESPIRATORY_TRACT | 0 refills | Status: AC | PRN

## 2023-07-12 MED ORDER — BENZONATATE 100 MG PO CAPS: 100.0000 mg | ORAL_CAPSULE | Freq: Three times a day (TID) | ORAL | 0 refills | Status: AC | PRN

## 2023-07-12 MED ORDER — CYCLOBENZAPRINE HCL 10 MG PO TABS: 5.0000 mg | ORAL_TABLET | Freq: Three times a day (TID) | ORAL | 0 refills | Status: DC | PRN

## 2023-07-12 NOTE — Progress Notes (Signed)
 Virtual Visit Consent   Ann Moran, you are scheduled for a virtual visit with a [facility] provider today. Just as with appointments in the office, your consent must be obtained to participate. Your consent will be active for this visit and any virtual visit you may have with one of our providers in the next 365 days. If you have a MyChart account, a copy of this consent can be sent to you electronically.  As this is a virtual visit, video technology does not allow for your provider to perform a traditional examination. This may limit your provider's ability to fully assess your condition. If your provider identifies any concerns that need to be evaluated in person or the need to arrange testing (such as labs, EKG, etc.), we will make arrangements to do so. Although advances in technology are sophisticated, we cannot ensure that it will always work on either your end or our end. If the connection with a video visit is poor, the visit may have to be switched to a telephone visit. With either a video or telephone visit, we are not always able to ensure that we have a secure connection.  By engaging in this virtual visit, you consent to the provision of healthcare and authorize for your insurance to be billed (if applicable) for the services provided during this visit. Depending on your insurance coverage, you may receive a charge related to this service.  I need to obtain your verbal consent now. Are you willing to proceed with your visit today? Ann Moran has provided verbal consent on 07/12/2023 for a virtual visit (video or telephone). Ann Loveless, PA-C  Date: 07/12/2023 1:00 PM   Virtual Visit via Video Note   I, Ann Moran, connected with  Ann Moran  ([id number], 02/28/1986) on 07/12/23 at 12:45 PM EDT by a video-enabled telemedicine application and verified that I am speaking with the correct person using two identifiers.  Location: Patient: Virtual  Visit Location Patient: Home Provider: Virtual Visit Location Provider: Home Office   I discussed the limitations of evaluation and management by telemedicine and the availability of in person appointments. The patient expressed understanding and agreed to proceed.    History of Present Illness: Ann Moran is a 38 y.o. who identifies as a female who was assigned female at birth, and is being seen today for back pain and cough.  HPI: Back Pain This is a new problem. The current episode started yesterday (Started with a cough yesterday, had a deep cough and felt a catch in her mid-back on the left side. Now having pain with movements, coughing, deep breathing, even eating aggravates the area). The problem occurs constantly. The problem is unchanged. The pain is present in the thoracic spine. The quality of the pain is described as stabbing. The pain does not radiate. The pain is moderate. The pain is The same all the time. The symptoms are aggravated by twisting, bending, standing and coughing. Stiffness is present All day. Pertinent negatives include no bladder incontinence, bowel incontinence, chest pain, fever, headaches, leg pain, numbness, paresis, paresthesias, pelvic pain or weakness. Risk factors include obesity and sedentary lifestyle. Treatments tried: cyclobenzaprine and tylenol. The treatment provided no relief.     Problems:  Patient Active Problem List   Diagnosis Date Noted   Sinusitis, acute 01/31/2022   Migraine 01/31/2022   Anxiety and depression 01/31/2022   OSA (obstructive sleep apnea) 01/25/2022   Smoking 02/23/2021   Patellar tendinitis of  both knees 01/27/2021   Pain in joint involving right ankle and foot 01/27/2021   Thoracic back pain 12/23/2020   Right elbow pain 12/23/2020   Chronic pain of right knee 12/23/2020   Ankle fracture, left 08/09/2020   Dysphagia 05/20/2019   Encounter for other preprocedural examination 05/20/2019   PCOS (polycystic ovarian  syndrome) 02/24/2019   Neck pain 02/24/2019   Morbid obesity with body mass index (BMI) of 45.0 to 49.9 in adult [facility]) 02/24/2019   Essential hypertension 02/24/2019   Gastroesophageal reflux disease 02/24/2019   Irregular menstrual cycle 01/14/2019   Abnormal uterine bleeding 01/14/2019   Chronic cough 01/14/2019    Allergies:  Allergies  Allergen Reactions   Prednisone Swelling    Had neck and shoulder swelling following taking   Medications:  Current Outpatient Medications:    albuterol (VENTOLIN HFA) 108 (90 Base) MCG/ACT inhaler, Inhale 1-2 puffs into the lungs every 6 (six) hours as needed., Disp: 8 g, Rfl: 0   benzonatate (TESSALON) 100 MG capsule, Take 1-2 capsules (100-200 mg total) by mouth 3 (three) times daily as needed., Disp: 30 capsule, Rfl: 0   cyclobenzaprine (FLEXERIL) 10 MG tablet, Take 0.5-1 tablets (5-10 mg total) by mouth 3 (three) times daily as needed., Disp: 30 tablet, Rfl: 0   amLODipine (NORVASC) 10 MG tablet, Take 1 tablet (10 mg total) by mouth daily. TAKE 1 TABLET(10 MG) BY MOUTH DAILY. Please make PCP appointment for more refills., Disp: 90 tablet, Rfl: 1   amoxicillin-clavulanate (AUGMENTIN) 875-125 MG tablet, Take 1 tablet by mouth 2 (two) times daily., Disp: 20 tablet, Rfl: 0   Blood Pressure Monitoring (BLOOD PRESSURE CUFF) MISC, Use to check blood pressure once daily., Disp: 1 each, Rfl: 0   brompheniramine-pseudoephedrine-DM 30-2-10 MG/5ML syrup, Take 5 mLs by mouth 4 (four) times daily as needed., Disp: 240 mL, Rfl: 0   cetirizine (ZYRTEC ALLERGY) 10 MG tablet, Take 1 tablet (10 mg total) by mouth at bedtime for 15 days., Disp: 15 tablet, Rfl: 0   chlorhexidine (PERIDEX) 0.12 % solution, Use as directed 15 mLs in the mouth or throat 2 (two) times daily., Disp: 120 mL, Rfl: 0   guaiFENesin (MUCINEX) 600 MG 12 hr tablet, Take 2 tablets (1,200 mg total) by mouth 2 (two) times daily as needed for cough or to loosen phlegm., Disp: 120 tablet, Rfl: 0    ibuprofen (ADVIL) 600 MG tablet, Take 1 tablet (600 mg total) by mouth every 8 (eight) hours as needed., Disp: 30 tablet, Rfl: 0   ipratropium (ATROVENT) 0.03 % nasal spray, Place 2 sprays into both nostrils every 12 (twelve) hours., Disp: 30 mL, Rfl: 0   metroNIDAZOLE (METROCREAM) 0.75 % cream, Apply topically 2 (two) times daily., Disp: 45 g, Rfl: 0   naproxen (NAPROSYN) 500 MG tablet, Take 1 tablet (500 mg total) by mouth 2 (two) times daily with a meal., Disp: 30 tablet, Rfl: 0   olopatadine (PATANOL) 0.1 % ophthalmic solution, Place 1 drop into the left eye 2 (two) times daily., Disp: 5 mL, Rfl: 0   pantoprazole (PROTONIX) 40 MG tablet, TAKE 1 TABLET(40 MG) BY MOUTH DAILY, Disp: 90 tablet, Rfl: 1   promethazine-dextromethorphan (PROMETHAZINE-DM) 6.25-15 MG/5ML syrup, Take 5 mLs by mouth 3 (three) times daily as needed for cough., Disp: 200 mL, Rfl: 0   triamcinolone ointment (KENALOG) 0.5 %, Apply 1 Application topically 2 (two) times daily., Disp: 30 g, Rfl: 0   valsartan-hydrochlorothiazide (DIOVAN-HCT) 160-25 MG tablet, Take 1 tablet by mouth  daily., Disp: 90 tablet, Rfl: 3  Observations/Objective: Patient is well-developed, well-nourished in no acute distress.  Resting comfortably  Head is normocephalic, atraumatic.  No labored breathing.  Speech is clear and coherent with logical content.  Patient is alert and oriented at baseline.    Assessment and Plan: 1. Acute midline thoracic back pain (Primary) - cyclobenzaprine (FLEXERIL) 10 MG tablet; Take 0.5-1 tablets (5-10 mg total) by mouth 3 (three) times daily as needed.  Dispense: 30 tablet; Refill: 0  2. Acute cough - albuterol (VENTOLIN HFA) 108 (90 Base) MCG/ACT inhaler; Inhale 1-2 puffs into the lungs every 6 (six) hours as needed.  Dispense: 8 g; Refill: 0 - benzonatate (TESSALON) 100 MG capsule; Take 1-2 capsules (100-200 mg total) by mouth 3 (three) times daily as needed.  Dispense: 30 capsule; Refill: 0  - Suspect back  spasm from acute cough - Add flexeril - Use Ibuprofen 600mg  (3 tablets 200mg ) every 8 hours or Naproxen 440mg  (2 tablets 220mg ) every 12 hours as needed for pain - Tylenol if okay for breakthrough pain - Heat to area - Epsom salt soak if able to get in and out of bath tub safely - Back exercises and stretches provided via AVS - Albuterol added for wheezing that has been more consistent of recent - Tessalon for cough - Patient also interested in smoking cessation. Scheduled for appt on 07/16/23 at 10am for smoking cessation, initial appt. - Seek in person evaluation if worsening or fails to improve with treatment   Follow Up Instructions: I discussed the assessment and treatment plan with the patient. The patient was provided an opportunity to ask questions and all were answered. The patient agreed with the plan and demonstrated an understanding of the instructions.  A copy of instructions were sent to the patient via MyChart unless otherwise noted below.    The patient was advised to call back or seek an in-person evaluation if the symptoms worsen or if the condition fails to improve as anticipated.    Ann Loveless, PA-C

## 2023-07-12 NOTE — Patient Instructions (Signed)
 Raye Sorrow, thank you for joining [name], PA-C for today's virtual visit.  While this provider is not your primary care provider (PCP), if your PCP is located in our provider database this encounter information will be shared with them immediately following your visit.   A [facility] MyChart account gives you access to today's visit and all your visits, tests, and labs performed at [facility] " click here if you don't have a  MyChart account or go to [email or web address]  Consent: (Patient) Fontella Shan provided verbal consent for this virtual visit at the beginning of the encounter.  Current Medications:  Current Outpatient Medications:    albuterol (VENTOLIN HFA) 108 (90 Base) MCG/ACT inhaler, Inhale 1-2 puffs into the lungs every 6 (six) hours as needed., Disp: 8 g, Rfl: 0   benzonatate (TESSALON) 100 MG capsule, Take 1-2 capsules (100-200 mg total) by mouth 3 (three) times daily as needed., Disp: 30 capsule, Rfl: 0   cyclobenzaprine (FLEXERIL) 10 MG tablet, Take 0.5-1 tablets (5-10 mg total) by mouth 3 (three) times daily as needed., Disp: 30 tablet, Rfl: 0   amLODipine (NORVASC) 10 MG tablet, Take 1 tablet (10 mg total) by mouth daily. TAKE 1 TABLET(10 MG) BY MOUTH DAILY. Please make PCP appointment for more refills., Disp: 90 tablet, Rfl: 1   amoxicillin-clavulanate (AUGMENTIN) 875-125 MG tablet, Take 1 tablet by mouth 2 (two) times daily., Disp: 20 tablet, Rfl: 0   Blood Pressure Monitoring (BLOOD PRESSURE CUFF) MISC, Use to check blood pressure once daily., Disp: 1 each, Rfl: 0   brompheniramine-pseudoephedrine-DM 30-2-10 MG/5ML syrup, Take 5 mLs by mouth 4 (four) times daily as needed., Disp: 240 mL, Rfl: 0   cetirizine (ZYRTEC ALLERGY) 10 MG tablet, Take 1 tablet (10 mg total) by mouth at bedtime for 15 days., Disp: 15 tablet, Rfl: 0   chlorhexidine (PERIDEX) 0.12 % solution, Use as directed 15 mLs in the mouth or throat 2 (two)  times daily., Disp: 120 mL, Rfl: 0   guaiFENesin (MUCINEX) 600 MG 12 hr tablet, Take 2 tablets (1,200 mg total) by mouth 2 (two) times daily as needed for cough or to loosen phlegm., Disp: 120 tablet, Rfl: 0   ibuprofen (ADVIL) 600 MG tablet, Take 1 tablet (600 mg total) by mouth every 8 (eight) hours as needed., Disp: 30 tablet, Rfl: 0   ipratropium (ATROVENT) 0.03 % nasal spray, Place 2 sprays into both nostrils every 12 (twelve) hours., Disp: 30 mL, Rfl: 0   metroNIDAZOLE (METROCREAM) 0.75 % cream, Apply topically 2 (two) times daily., Disp: 45 g, Rfl: 0   naproxen (NAPROSYN) 500 MG tablet, Take 1 tablet (500 mg total) by mouth 2 (two) times daily with a meal., Disp: 30 tablet, Rfl: 0   olopatadine (PATANOL) 0.1 % ophthalmic solution, Place 1 drop into the left eye 2 (two) times daily., Disp: 5 mL, Rfl: 0   pantoprazole (PROTONIX) 40 MG tablet, TAKE 1 TABLET(40 MG) BY MOUTH DAILY, Disp: 90 tablet, Rfl: 1   promethazine-dextromethorphan (PROMETHAZINE-DM) 6.25-15 MG/5ML syrup, Take 5 mLs by mouth 3 (three) times daily as needed for cough., Disp: 200 mL, Rfl: 0   triamcinolone ointment (KENALOG) 0.5 %, Apply 1 Application topically 2 (two) times daily., Disp: 30 g, Rfl: 0   valsartan-hydrochlorothiazide (DIOVAN-HCT) 160-25 MG tablet, Take 1 tablet by mouth daily., Disp: 90 tablet, Rfl: 3   Medications ordered in this encounter:  Meds ordered this encounter  Medications   cyclobenzaprine (FLEXERIL) 10 MG tablet  Sig: Take 0.5-1 tablets (5-10 mg total) by mouth 3 (three) times daily as needed.    Dispense:  30 tablet    Refill:  0    Supervising Provider:   Merrilee Jansky [[id number]]   albuterol (VENTOLIN HFA) 108 (90 Base) MCG/ACT inhaler    Sig: Inhale 1-2 puffs into the lungs every 6 (six) hours as needed.    Dispense:  8 g    Refill:  0    Supervising Provider:   Merrilee Jansky [[id number]]   benzonatate (TESSALON) 100 MG capsule    Sig: Take 1-2 capsules (100-200 mg total) by mouth 3  (three) times daily as needed.    Dispense:  30 capsule    Refill:  0    Supervising Provider:   Merrilee Jansky [[id number]]     *If you need refills on other medications prior to your next appointment, please contact your pharmacy*  Follow-Up: Call back or seek an in-person evaluation if the symptoms worsen or if the condition fails to improve as anticipated.  [facility] [phone]  Other Instructions Back Exercises The following exercises strengthen the muscles that help to support the trunk (torso) and back. They also help to keep the lower back flexible. Doing these exercises can help to prevent or lessen existing low back pain. If you have back pain or discomfort, try doing these exercises 2-3 times each day or as told by your health care provider. As your pain improves, do them once each day, but increase the number of times that you repeat the steps for each exercise (do more repetitions). To prevent the recurrence of back pain, continue to do these exercises once each day or as told by your health care provider. Do exercises exactly as told by your health care provider and adjust them as directed. It is normal to feel mild stretching, pulling, tightness, or discomfort as you do these exercises, but you should stop right away if you feel sudden pain or your pain gets worse. Exercises Single knee to chest Repeat these steps 3-5 times for each leg: Lie on your back on a firm bed or the floor with your legs extended. Bring one knee to your chest. Your other leg should stay extended and in contact with the floor. Hold your knee in place by grabbing your knee or thigh with both hands and hold. Pull on your knee until you feel a gentle stretch in your lower back or buttocks. Hold the stretch for 10-30 seconds. Slowly release and straighten your leg.  Pelvic tilt Repeat these steps 5-10 times: Lie on your back on a firm bed or the floor with your legs extended. Bend  your knees so they are pointing toward the ceiling and your feet are flat on the floor. Tighten your lower abdominal muscles to press your lower back against the floor. This motion will tilt your pelvis so your tailbone points up toward the ceiling instead of pointing to your feet or the floor. With gentle tension and even breathing, hold this position for 5-10 seconds.  Cat-cow Repeat these steps until your lower back becomes more flexible: Get into a hands-and-knees position on a firm bed or the floor. Keep your hands under your shoulders, and keep your knees under your hips. You may place padding under your knees for comfort. Let your head hang down toward your chest. Contract your abdominal muscles and point your tailbone toward the floor so your lower back becomes rounded  like the back of a cat. Hold this position for 5 seconds. Slowly lift your head, let your abdominal muscles relax, and point your tailbone up toward the ceiling so your back forms a sagging arch like the back of a cow. Hold this position for 5 seconds.  Press-ups Repeat these steps 5-10 times: Lie on your abdomen (face-down) on a firm bed or the floor. Place your palms near your head, about shoulder-width apart. Keeping your back as relaxed as possible and keeping your hips on the floor, slowly straighten your arms to raise the top half of your body and lift your shoulders. Do not use your back muscles to raise your upper torso. You may adjust the placement of your hands to make yourself more comfortable. Hold this position for 5 seconds while you keep your back relaxed. Slowly return to lying flat on the floor.  Bridges Repeat these steps 10 times: Lie on your back on a firm bed or the floor. Bend your knees so they are pointing toward the ceiling and your feet are flat on the floor. Your arms should be flat at your sides, next to your body. Tighten your buttocks muscles and lift your buttocks off the floor until your  waist is at almost the same height as your knees. You should feel the muscles working in your buttocks and the back of your thighs. If you do not feel these muscles, slide your feet 1-2 inches (2.5-5 cm) farther away from your buttocks. Hold this position for 3-5 seconds. Slowly lower your hips to the starting position, and allow your buttocks muscles to relax completely. If this exercise is too easy, try doing it with your arms crossed over your chest. Abdominal crunches Repeat these steps 5-10 times: Lie on your back on a firm bed or the floor with your legs extended. Bend your knees so they are pointing toward the ceiling and your feet are flat on the floor. Cross your arms over your chest. Tip your chin slightly toward your chest without bending your neck. Tighten your abdominal muscles and slowly raise your torso high enough to lift your shoulder blades a tiny bit off the floor. Avoid raising your torso higher than that because it can put too much stress on your lower back and does not help to strengthen your abdominal muscles. Slowly return to your starting position.  Back lifts Repeat these steps 5-10 times: Lie on your abdomen (face-down) with your arms at your sides, and rest your forehead on the floor. Tighten the muscles in your legs and your buttocks. Slowly lift your chest off the floor while you keep your hips pressed to the floor. Keep the back of your head in line with the curve in your back. Your eyes should be looking at the floor. Hold this position for 3-5 seconds. Slowly return to your starting position.  Contact a health care provider if: Your back pain or discomfort gets much worse when you do an exercise. Your worsening back pain or discomfort does not lessen within 2 hours after you exercise. If you have any of these problems, stop doing these exercises right away. Do not do them again unless your health care provider says that you can. Get help right away if: You  develop sudden, severe back pain. If this happens, stop doing the exercises right away. Do not do them again unless your health care provider says that you can. This information is not intended to replace advice given to you by  your health care provider. Make sure you discuss any questions you have with your health care provider. Document Revised: 05/13/2022 Document Reviewed: 06/22/2020 Elsevier Patient Education  2024 Elsevier Inc.   If you have been instructed to have an in-person evaluation today at a local Urgent Care facility, please use the link below. It will take you to a list of all of our available [facility], including address, phone number and hours of operation. Please do not delay care.  [facility]  If you or a family member do not have a primary care provider, use the link below to schedule a visit and establish care. When you choose a [facility] primary care physician or advanced practice provider, you gain a long-term partner in health. Find a Primary Care Provider  Learn more about [facility] in-office and virtual care options: [facility] - Get Care Now

## 2023-07-14 ENCOUNTER — Telehealth: Admitting: Family

## 2023-07-14 DIAGNOSIS — R319 Hematuria, unspecified: Secondary | ICD-10-CM

## 2023-07-14 NOTE — Patient Instructions (Signed)
 Blood in the Pee (Hematuria) in Adults: What to Know  Hematuria is blood in the pee. You may be able to see blood in the pee. In some cases, a health care provider may find blood with a test.  Blood in the pee can be caused by infections of the kidney, bladder, or the urethra. The urethra is the tube that drains pee from the bladder.  Other causes may include: Kidney stones. Infection of the prostate. Cancer. Too much calcium in the pee. Conditions that are passed from parent to child. Too much exercise. Infections can be treated with medicine. A kidney stone will usually leave your body when you pee. If infections or kidney stones didn't cause the blood in the urine, then more tests may be needed. It is very important to tell your provider about any blood in your pee, even if you have no pain or the blood stops with no treatment. Blood in the pee can be a sign of a very serious problem, such as cancer. Follow these instructions at home: Medicines Take your medicines only as told. If you were given antibiotics, take them as told. Do not stop taking them even if you start to feel better. Eating and drinking Drink more fluids as told. Aim to drink 3-4 quarts (2.8-3.8 L) a day. Avoid caffeine, tea, and carbonated drinks. These can bother the bladder. Avoid alcohol if a female because it may irritate the prostate. General instructions If you have been diagnosed with a kidney stone, strain your pee to catch the stone if told by your provider. Empty your bladder often. Avoid holding pee for a long time. If you're female, make sure that: You wipe from front to back after using the bathroom. You use each piece of toilet paper only once. You pee before and after sex. It's up to you to get the results of any tests. Ask when your results will be ready and how to get them. You may need to call or meet with your provider to get your results. Keep all follow-up visits. Your provider will need to know  about any changes or any new symptoms. Contact a health care provider if: Your symptoms don't get better after 3 days. Your symptoms get worse. You have back pain or belly pain. You have a fever or chills. You throw up or feel like you may throw up. You throw up every time you take medicine. Get help right away if: You pass blood clots in your pee. You pass out. These symptoms may be an emergency. Call 911 right away. Do not wait to see if the symptoms will go away. Do not drive yourself to the hospital. This information is not intended to replace advice given to you by your health care provider. Make sure you discuss any questions you have with your health care provider. Document Revised: 01/24/2023 Document Reviewed: 01/03/2023 Elsevier Patient Education  2024 [employer].

## 2023-07-14 NOTE — Progress Notes (Signed)
 Virtual Visit Consent   Ann Moran, you are scheduled for a virtual visit with a [facility] provider today. Just as with appointments in the office, your consent must be obtained to participate. Your consent will be active for this visit and any virtual visit you may have with one of our providers in the next 365 days. If you have a MyChart account, a copy of this consent can be sent to you electronically.  As this is a virtual visit, video technology does not allow for your provider to perform a traditional examination. This may limit your provider's ability to fully assess your condition. If your provider identifies any concerns that need to be evaluated in person or the need to arrange testing (such as labs, EKG, etc.), we will make arrangements to do so. Although advances in technology are sophisticated, we cannot ensure that it will always work on either your end or our end. If the connection with a video visit is poor, the visit may have to be switched to a telephone visit. With either a video or telephone visit, we are not always able to ensure that we have a secure connection.  By engaging in this virtual visit, you consent to the provision of healthcare and authorize for your insurance to be billed (if applicable) for the services provided during this visit. Depending on your insurance coverage, you may receive a charge related to this service.  I need to obtain your verbal consent now. Are you willing to proceed with your visit today? Ann Moran has provided verbal consent on 07/14/2023 for a virtual visit (video or telephone). Jannifer Rodney, FNP  Date: 07/14/2023 7:27 PM   Virtual Visit via Video Note   I, Jannifer Rodney, connected with  Ann Moran  ([id number], 1985/09/07) on 07/14/23 at  7:15 PM EDT by a video-enabled telemedicine application and verified that I am speaking with the correct person using two identifiers.  Location: Patient: Virtual Visit  Location Patient: Home Provider: Virtual Visit Location Provider: Home Office   I discussed the limitations of evaluation and management by telemedicine and the availability of in person appointments. The patient expressed understanding and agreed to proceed.    History of Present Illness: Ann Moran is a 38 y.o. who identifies as a female who was assigned female at birth, and is being seen today for blood in her urine. She states she has not seen any blood on her pad, only after she urinates. Reports clots. States her last menstrual cycle was a year ago because she has PCOS. She reports her urine is yellow.   HPI: Hematuria This is a new problem. The current episode started in the past 7 days. The problem is unchanged. She is experiencing no pain. Irritative symptoms include frequency and urgency. Obstructive symptoms include straining.    Problems:  Patient Active Problem List   Diagnosis Date Noted   Sinusitis, acute 01/31/2022   Migraine 01/31/2022   Anxiety and depression 01/31/2022   OSA (obstructive sleep apnea) 01/25/2022   Smoking 02/23/2021   Patellar tendinitis of both knees 01/27/2021   Pain in joint involving right ankle and foot 01/27/2021   Thoracic back pain 12/23/2020   Right elbow pain 12/23/2020   Chronic pain of right knee 12/23/2020   Ankle fracture, left 08/09/2020   Dysphagia 05/20/2019   Encounter for other preprocedural examination 05/20/2019   PCOS (polycystic ovarian syndrome) 02/24/2019   Neck pain 02/24/2019   Morbid obesity with body mass  index (BMI) of 45.0 to 49.9 in adult [facility]) 02/24/2019   Essential hypertension 02/24/2019   Gastroesophageal reflux disease 02/24/2019   Irregular menstrual cycle 01/14/2019   Abnormal uterine bleeding 01/14/2019   Chronic cough 01/14/2019    Allergies:  Allergies  Allergen Reactions   Prednisone Swelling    Had neck and shoulder swelling following taking   Medications:  Current Outpatient  Medications:    albuterol (VENTOLIN HFA) 108 (90 Base) MCG/ACT inhaler, Inhale 1-2 puffs into the lungs every 6 (six) hours as needed., Disp: 8 g, Rfl: 0   amLODipine (NORVASC) 10 MG tablet, Take 1 tablet (10 mg total) by mouth daily. TAKE 1 TABLET(10 MG) BY MOUTH DAILY. Please make PCP appointment for more refills., Disp: 90 tablet, Rfl: 1   amoxicillin-clavulanate (AUGMENTIN) 875-125 MG tablet, Take 1 tablet by mouth 2 (two) times daily., Disp: 20 tablet, Rfl: 0   benzonatate (TESSALON) 100 MG capsule, Take 1-2 capsules (100-200 mg total) by mouth 3 (three) times daily as needed., Disp: 30 capsule, Rfl: 0   Blood Pressure Monitoring (BLOOD PRESSURE CUFF) MISC, Use to check blood pressure once daily., Disp: 1 each, Rfl: 0   brompheniramine-pseudoephedrine-DM 30-2-10 MG/5ML syrup, Take 5 mLs by mouth 4 (four) times daily as needed., Disp: 240 mL, Rfl: 0   cetirizine (ZYRTEC ALLERGY) 10 MG tablet, Take 1 tablet (10 mg total) by mouth at bedtime for 15 days., Disp: 15 tablet, Rfl: 0   chlorhexidine (PERIDEX) 0.12 % solution, Use as directed 15 mLs in the mouth or throat 2 (two) times daily., Disp: 120 mL, Rfl: 0   cyclobenzaprine (FLEXERIL) 10 MG tablet, Take 0.5-1 tablets (5-10 mg total) by mouth 3 (three) times daily as needed., Disp: 30 tablet, Rfl: 0   guaiFENesin (MUCINEX) 600 MG 12 hr tablet, Take 2 tablets (1,200 mg total) by mouth 2 (two) times daily as needed for cough or to loosen phlegm., Disp: 120 tablet, Rfl: 0   ibuprofen (ADVIL) 600 MG tablet, Take 1 tablet (600 mg total) by mouth every 8 (eight) hours as needed., Disp: 30 tablet, Rfl: 0   ipratropium (ATROVENT) 0.03 % nasal spray, Place 2 sprays into both nostrils every 12 (twelve) hours., Disp: 30 mL, Rfl: 0   metroNIDAZOLE (METROCREAM) 0.75 % cream, Apply topically 2 (two) times daily., Disp: 45 g, Rfl: 0   naproxen (NAPROSYN) 500 MG tablet, Take 1 tablet (500 mg total) by mouth 2 (two) times daily with a meal., Disp: 30 tablet, Rfl:  0   olopatadine (PATANOL) 0.1 % ophthalmic solution, Place 1 drop into the left eye 2 (two) times daily., Disp: 5 mL, Rfl: 0   pantoprazole (PROTONIX) 40 MG tablet, TAKE 1 TABLET(40 MG) BY MOUTH DAILY, Disp: 90 tablet, Rfl: 1   promethazine-dextromethorphan (PROMETHAZINE-DM) 6.25-15 MG/5ML syrup, Take 5 mLs by mouth 3 (three) times daily as needed for cough., Disp: 200 mL, Rfl: 0   triamcinolone ointment (KENALOG) 0.5 %, Apply 1 Application topically 2 (two) times daily., Disp: 30 g, Rfl: 0   valsartan-hydrochlorothiazide (DIOVAN-HCT) 160-25 MG tablet, Take 1 tablet by mouth daily., Disp: 90 tablet, Rfl: 3  Observations/Objective: Patient is well-developed, well-nourished in no acute distress.  Resting comfortably  at home.  Head is normocephalic, atraumatic.  No labored breathing.  Speech is clear and coherent with logical content.  Patient is alert and oriented at baseline.    Assessment and Plan: 1. Hematuria, unspecified type (Primary)  Force fluids Recommend to be seen in person by PCP or  Urgent Care tomorrow to rule out UTI Unsure if this is related to menstrual given urine is yellow? Pt will follow up tomorrow  Follow Up Instructions: I discussed the assessment and treatment plan with the patient. The patient was provided an opportunity to ask questions and all were answered. The patient agreed with the plan and demonstrated an understanding of the instructions.  A copy of instructions were sent to the patient via MyChart unless otherwise noted below.     The patient was advised to call back or seek an in-person evaluation if the symptoms worsen or if the condition fails to improve as anticipated.    Jannifer Rodney, FNP

## 2023-07-15 ENCOUNTER — Ambulatory Visit

## 2023-07-16 ENCOUNTER — Telehealth: Admitting: Physician Assistant

## 2023-07-16 ENCOUNTER — Encounter: Payer: Self-pay | Admitting: Physician Assistant

## 2023-07-16 DIAGNOSIS — F17210 Nicotine dependence, cigarettes, uncomplicated: Secondary | ICD-10-CM | POA: Diagnosis not present

## 2023-07-16 DIAGNOSIS — Z716 Tobacco abuse counseling: Secondary | ICD-10-CM

## 2023-07-16 MED ORDER — NICOTINE 14 MG/24HR TD PT24: 14.0000 mg | MEDICATED_PATCH | Freq: Every day | TRANSDERMAL | 0 refills | Status: AC

## 2023-07-16 NOTE — Patient Instructions (Signed)
 Raye Sorrow, thank you for joining [name], PA-C for today's virtual visit.  While this provider is not your primary care provider (PCP), if your PCP is located in our provider database this encounter information will be shared with them immediately following your visit.  Consent: (Patient) Heidemarie Goodnow provided verbal consent for this virtual visit at the beginning of the encounter.  Current Medications:  Current Outpatient Medications:  .  nicotine (NICODERM CQ - DOSED IN MG/24 HOURS) 14 mg/24hr patch, Place 1 patch (14 mg total) onto the skin daily., Disp: 30 patch, Rfl: 0 .  albuterol (VENTOLIN HFA) 108 (90 Base) MCG/ACT inhaler, Inhale 1-2 puffs into the lungs every 6 (six) hours as needed., Disp: 8 g, Rfl: 0 .  amLODipine (NORVASC) 10 MG tablet, Take 1 tablet (10 mg total) by mouth daily. TAKE 1 TABLET(10 MG) BY MOUTH DAILY. Please make PCP appointment for more refills., Disp: 90 tablet, Rfl: 1 .  amoxicillin-clavulanate (AUGMENTIN) 875-125 MG tablet, Take 1 tablet by mouth 2 (two) times daily., Disp: 20 tablet, Rfl: 0 .  benzonatate (TESSALON) 100 MG capsule, Take 1-2 capsules (100-200 mg total) by mouth 3 (three) times daily as needed., Disp: 30 capsule, Rfl: 0 .  Blood Pressure Monitoring (BLOOD PRESSURE CUFF) MISC, Use to check blood pressure once daily., Disp: 1 each, Rfl: 0 .  brompheniramine-pseudoephedrine-DM 30-2-10 MG/5ML syrup, Take 5 mLs by mouth 4 (four) times daily as needed., Disp: 240 mL, Rfl: 0 .  cetirizine (ZYRTEC ALLERGY) 10 MG tablet, Take 1 tablet (10 mg total) by mouth at bedtime for 15 days., Disp: 15 tablet, Rfl: 0 .  chlorhexidine (PERIDEX) 0.12 % solution, Use as directed 15 mLs in the mouth or throat 2 (two) times daily., Disp: 120 mL, Rfl: 0 .  cyclobenzaprine (FLEXERIL) 10 MG tablet, Take 0.5-1 tablets (5-10 mg total) by mouth 3 (three) times daily as needed., Disp: 30 tablet, Rfl: 0 .  guaiFENesin (MUCINEX) 600 MG 12 hr tablet, Take 2  tablets (1,200 mg total) by mouth 2 (two) times daily as needed for cough or to loosen phlegm., Disp: 120 tablet, Rfl: 0 .  ibuprofen (ADVIL) 600 MG tablet, Take 1 tablet (600 mg total) by mouth every 8 (eight) hours as needed., Disp: 30 tablet, Rfl: 0 .  ipratropium (ATROVENT) 0.03 % nasal spray, Place 2 sprays into both nostrils every 12 (twelve) hours., Disp: 30 mL, Rfl: 0 .  metroNIDAZOLE (METROCREAM) 0.75 % cream, Apply topically 2 (two) times daily., Disp: 45 g, Rfl: 0 .  naproxen (NAPROSYN) 500 MG tablet, Take 1 tablet (500 mg total) by mouth 2 (two) times daily with a meal., Disp: 30 tablet, Rfl: 0 .  olopatadine (PATANOL) 0.1 % ophthalmic solution, Place 1 drop into the left eye 2 (two) times daily., Disp: 5 mL, Rfl: 0 .  pantoprazole (PROTONIX) 40 MG tablet, TAKE 1 TABLET(40 MG) BY MOUTH DAILY, Disp: 90 tablet, Rfl: 1 .  promethazine-dextromethorphan (PROMETHAZINE-DM) 6.25-15 MG/5ML syrup, Take 5 mLs by mouth 3 (three) times daily as needed for cough., Disp: 200 mL, Rfl: 0 .  triamcinolone ointment (KENALOG) 0.5 %, Apply 1 Application topically 2 (two) times daily., Disp: 30 g, Rfl: 0 .  valsartan-hydrochlorothiazide (DIOVAN-HCT) 160-25 MG tablet, Take 1 tablet by mouth daily., Disp: 90 tablet, Rfl: 3   When you are getting low on your smoking cessation medications, schedule your next video appointment as previously discussed. This way we can follow-up with you, make any necessary adjustments/changes, and get  next fill of medication sent in to your pharmacy.   Follow-Up: Call back or seek an in-person evaluation if the symptoms worsen or if the condition fails to improve as anticipated.  Other Instructions  Congratulations for your interest in quitting smoking!  Quitting smoking is one of the most important things you can do to protect your health.  We are here to help you! Did you know that if you quit smoking 1 pack per day you could save up to $2550.00 per year?  Medications are not  appropriate for everyone depending upon your situation and any health issues you may have. If we do prescribe medication, it will be for 1 month at time and you will be required to have a follow up Video visit at 1 month to assess how you are doing and if there are any side effects.  This could be for up to a total of 3 months.    Support from your friends, family and work colleagues is very important. Please let them know that you are trying to stop smoking so that they understand your need for support in this goal!  - Remove tobacco products from your environment - Set a quit date ideally within 2 weeks. - You should totally abstain from smoking after your quit date. A single puff could hurt your progress or cause you to relapse - If there are others in your household that smoke, ask them to try to quit or abstain from smoking in your presence  You may notice nicotine withdrawal symptoms such as increased appetite and weight gain, changes in mood, insomnia, irritability and/or anxiety. These symptoms peak in the first three days after smoking cessation and subside over the next 3-4 weeks.   I have prescribed Nicotine patches.  The patient is to apply the patch to a non-hairy skin site and rotate the site daily. Will begin with starting dose of Because you were smoking < 10 cigarettes per day, I have prescribed a 14 mg patch for you to apply daily for 6 weeks. Then we will plan to decrease to a 7 mg patch daily for 2 additional weeks. We will discuss again in detail at your follow-up visit!  A combination of behavioral and medication can improve the success of you quitting smoking. You may want to use the 1-800-QUIT-NOW free support line.  Also, the Department of Health and Human Services provides Smoke free apps for smartphones: SharedCustomer.fi  If you happen to break your plan and have a cigarette, keep taking your medications and continue to try to abstain.  Do not give  up!  MAKE SURE YOU  Take any prescribed medications only as instructed.  If you miss a dose of medication, take the next dose when it is due and get back on schedule. DO NOT double up on medications.  Mark your calendar to do your Follow Up Smoking Cessation Visit in one month   If you have been instructed to have an in-person evaluation today at a local Urgent Care facility, please use the link below. It will take you to a list of all of our available [facility], including address, phone number and hours of operation. Please do not delay care.  [facility]  If you or a family member do not have a primary care provider, use the link below to schedule a visit and establish care. When you choose a [facility] primary care physician or advanced practice provider, you gain a  long-term partner in health. Find a Primary Care Provider  Learn more about [facility] in-office and virtual care options: [facility] - Get Care Now

## 2023-07-16 NOTE — Progress Notes (Signed)
 Virtual Visit Consent:  Ann Moran, you are scheduled for a virtual visit with a [facility] provider today. Just as with appointments in the office, your consent must be obtained to participate. Your consent will be active for this visit and any virtual visit you may have with one of our providers in the next 365 days. If you have a MyChart account, a copy of this consent can be sent to you electronically.  As this is a virtual visit, video technology does not allow for your provider to perform a traditional examination. This may limit your provider's ability to fully assess your condition. If your provider identifies any concerns that need to be evaluated in person or the need to arrange testing (such as labs, EKG, etc.), we will make arrangements to do so. Although advances in technology are sophisticated, we cannot ensure that it will always work on either your end or our end. If the connection with a video visit is poor, the visit may have to be switched to a telephone visit. With either a video or telephone visit, we are not always able to ensure that we have a secure connection.  By engaging in this virtual visit, you consent to the provision of healthcare and authorize for your insurance to be billed (if applicable) for the services provided during this visit. Depending on your insurance coverage, you may receive a charge related to this service.  I need to obtain your verbal consent now. Are you willing to proceed with your visit today? Ann Moran has provided verbal consent on 07/16/2023 for a virtual visit (video or telephone). Ann Moran, [place]  Date: 07/16/2023 11:16 AM  Virtual Visit via Video Note  I, Ann Moran, connected with  Ann Moran  ([id number], 1985/12/27) on 07/16/23 at 10:00 AM EDT by a video-enabled telemedicine application and verified that I am speaking with the correct person using two identifiers.  Location: Patient: Virtual  Visit Location Patient: Home Provider: Virtual Visit Location Provider: Home Office   I discussed the limitations of evaluation and management by telemedicine and the availability of in person appointments. The patient expressed understanding and agreed to proceed.    History of Present Illness: Ann Moran is wanting to discuss their tobacco use and is seeking assistance with cessation. Patient has been smoking/using around 3 cigarettes per day for the past > 10 years. Also notes occasional use of a cigar. There has not been prior attempts to quit.  Triggers: The following trigger(s) for use has/have been identified: psychological: after meals, caffeine, car  Withdrawal Symptoms: Identified withdrawal symptoms: irritable mood, snacking.  State of Readiness: Patient feels overall Ready to quit. Concerns/Barriers to Cessation - Lack of Support  BP Readings from Last 3 Encounters:  03/20/23 130/80  03/07/23 134/80  07/27/22 (!) 163/114    Observations/Objective: Patient is well-developed, well-nourished in no acute distress.  Resting comfortably  at home.  Head is normocephalic, atraumatic.  No labored breathing.  Speech is clear and coherent with logical content.  Patient is alert and oriented at baseline.   1. Nicotine dependence, cigarettes, uncomplicated (Primary) - Patient is currently at the following State of Change: Preparation -- actively planning an attempt to quit - Comorbid conditions identified: HTN, OSA - Reviewed impacts of smoking on patient's health. - Order for amb BP cuff sent as hers has broken. Check BP daily and record to review at follow-up. - Quit date set for 2 weeks from tomorrow. - I have prescribed  Nicotine patches.  The patient is to apply the patch to a non-hairy skin site and rotate the site daily. Will begin with starting dose of Because you were smoking < 10 cigarettes per day, I have prescribed a 14 mg patch for you to apply daily for 6 weeks. Then we  will plan to decrease to a 7 mg patch daily for 2 additional weeks. We will discuss again in detail at your follow-up visit! - Other resources including the [employer] and Department of Health and [employer] -- have been included in the patient's written instructions and sent to their MyChart. - Plan for follow-up in 1 week.  Follow Up Instructions: I discussed the assessment and treatment plan with the patient. The patient was provided an opportunity to ask questions and all were answered. The patient agreed with the plan and demonstrated an understanding of the instructions.  A copy of instructions were sent to the patient via MyChart unless otherwise noted below.   Time:  I have spent 25 minutes with the patient via telehealth technology in tobacco cessation counseling.    Ann Climes, PA-C

## 2023-07-24 ENCOUNTER — Telehealth: Admitting: Physician Assistant

## 2023-07-24 NOTE — Progress Notes (Signed)
 The patient no-showed for appointment despite this provider sending direct link, reaching out via phone with no response and waiting for at least 10 minutes from appointment time for patient to join. They will be marked as a NS for this appointment/time.  ? ?[name], PA-C ? ? ? ?

## 2023-07-26 ENCOUNTER — Other Ambulatory Visit: Payer: Self-pay | Admitting: Nurse Practitioner

## 2023-07-26 ENCOUNTER — Encounter: Payer: Self-pay | Admitting: Physician Assistant

## 2023-07-26 DIAGNOSIS — J069 Acute upper respiratory infection, unspecified: Secondary | ICD-10-CM

## 2023-08-02 ENCOUNTER — Telehealth: Admitting: Family Medicine

## 2023-08-02 DIAGNOSIS — M542 Cervicalgia: Secondary | ICD-10-CM | POA: Diagnosis not present

## 2023-08-02 DIAGNOSIS — M25511 Pain in right shoulder: Secondary | ICD-10-CM | POA: Diagnosis not present

## 2023-08-02 DIAGNOSIS — M549 Dorsalgia, unspecified: Secondary | ICD-10-CM

## 2023-08-02 DIAGNOSIS — M546 Pain in thoracic spine: Secondary | ICD-10-CM | POA: Diagnosis not present

## 2023-08-02 MED ORDER — CYCLOBENZAPRINE HCL 10 MG PO TABS
10.0000 mg | ORAL_TABLET | Freq: Three times a day (TID) | ORAL | 0 refills | Status: DC | PRN
Start: 2023-08-02 — End: 2023-09-12

## 2023-08-02 MED ORDER — NAPROXEN 500 MG PO TABS: 500.0000 mg | ORAL_TABLET | Freq: Two times a day (BID) | ORAL | 0 refills | Status: DC

## 2023-08-02 MED ORDER — LIDOCAINE 5 % EX PTCH: 1.0000 | MEDICATED_PATCH | CUTANEOUS | 0 refills | Status: DC

## 2023-08-02 NOTE — Patient Instructions (Signed)
Cervical Radiculopathy  Cervical radiculopathy happens when a nerve in the neck (a cervical nerve) is pinched or bruised. This condition can happen because of an injury to the cervical spine (vertebrae) in the neck, or as part of the normal aging process. Pressure on the cervical nerves can cause pain or numbness that travels from the neck all the way down to the arm and fingers. This condition usually gets better with rest. Treatment may be needed if the condition does not improve. What are the causes? This condition may be caused by: A neck injury. A bulging (herniated) disk. Muscle spasms. Muscle tightness in the neck due to overuse. Arthritis. Breakdown or degeneration in the bones and joints of the spine (spondylosis) due to aging. Bone spurs that may develop near the cervical nerves. What are the signs or symptoms? Symptoms of this condition include: Pain. The pain may travel from the neck to the arm and hand. The pain can be severe or irritating. It may get worse when you move your neck. Numbness or tingling in your arm or hand. Weakness in the affected arm and hand, in severe cases. How is this diagnosed? This condition may be diagnosed based on your symptoms, your medical history, and a physical exam. You may also have tests, including: X-rays. CT scan. MRI. Electromyogram (EMG). Nerve conduction tests. How is this treated? In many cases, treatment is not needed for this condition. With rest, the condition usually gets better over time. If treatment is needed, options may include: Wearing a soft neck collar (cervical collar) for short periods of time. Doing physical therapy to strengthen your neck muscles. Taking medicines. These may include NSAIDs, such as ibuprofen, or oral corticosteroids. Having spinal injections, in severe cases. Having surgery. This may be needed if other treatments do not help. Different types of surgery may be done depending on the cause of this  condition. Follow these instructions at home: If you have a cervical collar: Wear it as told by your health care provider. Remove it only as told by your health care provider. Ask your health care provider if you can remove the cervical collar for cleaning and bathing. If you are allowed to remove the collar for cleaning or bathing: Follow instructions from your health care provider about how to remove the collar safely. Clean the collar by wiping it with mild soap and water and drying it completely. Take out any removable pads in the collar every 1-2 days, and wash them by hand with soap and water. Let them air-dry completely before you put them back in the collar. Check your skin under the collar for irritation or sores. If you see any, tell your health care provider. Managing pain     Take over-the-counter and prescription medicines only as told by your health care provider. If directed, put ice on the affected area. To do this: If you have a soft neck collar, remove it as told by your health care provider. Put ice in a plastic bag. Place a towel between your skin and the bag. Leave the ice on for 20 minutes, 2-3 times a day. Remove the ice if your skin turns bright red. This is very important. If you cannot feel pain, heat, or cold, you have a greater risk of damage to the area. If applying ice does not help, you can try using heat. Use the heat source that your health care provider recommends, such as a moist heat pack or a heating pad. Place a towel between   your skin and the heat source. Leave the heat on for 20-30 minutes. Remove the heat if your skin turns bright red. This is especially important if you are unable to feel pain, heat, or cold. You have a greater risk of getting burned. Try a gentle neck and shoulder massage to help relieve symptoms. Activity Rest as needed. Return to your normal activities as told by your health care provider. Ask your health care provider what  activities are safe for you. Do stretching and strengthening exercises as told by your health care provider or your physical therapist. You may have to avoid lifting. Ask your health care provider how much you can safely lift. General instructions Use a flat pillow when you sleep. Do not drive while wearing a cervical collar. If you do not have a cervical collar, ask your health care provider if it is safe to drive while your neck heals. Ask your health care provider if the medicine prescribed to you requires you to avoid driving or using machinery. Do not use any products that contain nicotine or tobacco. These products include cigarettes, chewing tobacco, and vaping devices, such as e-cigarettes. If you need help quitting, ask your health care provider. Keep all follow-up visits. This is important. Contact a health care provider if: Your condition does not improve with treatment. Get help right away if: Your pain gets much worse and is not controlled with medicines. You have weakness or numbness in your hand, arm, face, or leg. You have a high fever. You have a stiff, rigid neck. You lose control of your bowels or your bladder (have incontinence). You have trouble with walking, balance, or speaking. Summary Cervical radiculopathy happens when a nerve in the neck is pinched or bruised. A nerve can get pinched from a bulging disk, arthritis, muscle spasms, or an injury to the neck. Symptoms include pain, tingling, or numbness radiating from the neck to the arm or hand. Weakness can also occur in severe cases. Treatment may include rest, wearing a cervical collar, and physical therapy. Medicines may be prescribed to help with pain. In severe cases, injections or surgery may be needed. This information is not intended to replace advice given to you by your health care provider. Make sure you discuss any questions you have with your health care provider. Document Revised: 10/13/2020 Document  Reviewed: 10/13/2020 [employer] Patient Education  2024 [employer]  
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
## 2023-08-03 ENCOUNTER — Telehealth

## 2023-08-03 ENCOUNTER — Telehealth: Admitting: Family Medicine

## 2023-08-03 DIAGNOSIS — M542 Cervicalgia: Secondary | ICD-10-CM

## 2023-08-03 MED ORDER — LIDOCAINE 4 % EX PTCH: 3.0000 | MEDICATED_PATCH | CUTANEOUS | 0 refills | Status: AC

## 2023-08-03 NOTE — Progress Notes (Signed)
 Virtual Visit Consent   Ann Moran, you are scheduled for a virtual visit with a [facility] provider today. Just as with appointments in the office, your consent must be obtained to participate. Your consent will be active for this visit and any virtual visit you may have with one of our providers in the next 365 days. If you have a MyChart account, a copy of this consent can be sent to you electronically.  As this is a virtual visit, video technology does not allow for your provider to perform a traditional examination. This may limit your provider's ability to fully assess your condition. If your provider identifies any concerns that need to be evaluated in person or the need to arrange testing (such as labs, EKG, etc.), we will make arrangements to do so. Although advances in technology are sophisticated, we cannot ensure that it will always work on either your end or our end. If the connection with a video visit is poor, the visit may have to be switched to a telephone visit. With either a video or telephone visit, we are not always able to ensure that we have a secure connection.  By engaging in this virtual visit, you consent to the provision of healthcare and authorize for your insurance to be billed (if applicable) for the services provided during this visit. Depending on your insurance coverage, you may receive a charge related to this service.  I need to obtain your verbal consent now. Are you willing to proceed with your visit today? Ann Moran has provided verbal consent on 08/03/2023 for a virtual visit (video or telephone). Albertha Huger, FNP  Date: 08/03/2023 11:19 AM   Virtual Visit via Video Note   I, Albertha Huger, connected with  Ann Moran  ([id number], December 17, 1985) on 08/03/23 at 11:15 AM EDT by a video-enabled telemedicine application and verified that I am speaking with the correct person using two identifiers.  Location: Patient: Virtual Visit Location  Patient: Home Provider: Virtual Visit Location Provider: Home Office   I discussed the limitations of evaluation and management by telemedicine and the availability of in person appointments. The patient expressed understanding and agreed to proceed.    History of Present Illness: Ann Moran is a 38 y.o. who identifies as a female who was assigned female at birth, and is being seen today for neck pain. We sent in naprosyn and flexeril but she needs preauth or another rx for patches. She says the naprosyn and flexeril helped a lot last night. Aaron Aas  HPI: HPI  Problems:  Patient Active Problem List   Diagnosis Date Noted   Sinusitis, acute 01/31/2022   Migraine 01/31/2022   Anxiety and depression 01/31/2022   OSA (obstructive sleep apnea) 01/25/2022   Smoking 02/23/2021   Patellar tendinitis of both knees 01/27/2021   Pain in joint involving right ankle and foot 01/27/2021   Thoracic back pain 12/23/2020   Right elbow pain 12/23/2020   Chronic pain of right knee 12/23/2020   Ankle fracture, left 08/09/2020   Dysphagia 05/20/2019   Encounter for other preprocedural examination 05/20/2019   PCOS (polycystic ovarian syndrome) 02/24/2019   Neck pain 02/24/2019   Morbid obesity with body mass index (BMI) of 45.0 to 49.9 in adult [facility]) 02/24/2019   Essential hypertension 02/24/2019   Gastroesophageal reflux disease 02/24/2019   Irregular menstrual cycle 01/14/2019   Abnormal uterine bleeding 01/14/2019   Chronic cough 01/14/2019    Allergies:  Allergies  Allergen Reactions  Prednisone Swelling    Had neck and shoulder swelling following taking   Medications:  Current Outpatient Medications:    lidocaine (LIDOCAINE PAIN RELIEF) 4 %, Place 3 patches onto the skin daily., Disp: 90 patch, Rfl: 0   albuterol (VENTOLIN HFA) 108 (90 Base) MCG/ACT inhaler, Inhale 1-2 puffs into the lungs every 6 (six) hours as needed., Disp: 8 g, Rfl: 0   amLODipine (NORVASC) 10 MG tablet, Take 1  tablet (10 mg total) by mouth daily. TAKE 1 TABLET(10 MG) BY MOUTH DAILY. Please make PCP appointment for more refills., Disp: 90 tablet, Rfl: 1   amoxicillin-clavulanate (AUGMENTIN) 875-125 MG tablet, Take 1 tablet by mouth 2 (two) times daily., Disp: 20 tablet, Rfl: 0   benzonatate (TESSALON) 100 MG capsule, Take 1-2 capsules (100-200 mg total) by mouth 3 (three) times daily as needed., Disp: 30 capsule, Rfl: 0   Blood Pressure Monitoring (BLOOD PRESSURE CUFF) MISC, Use to check blood pressure once daily., Disp: 1 each, Rfl: 0   brompheniramine-pseudoephedrine-DM 30-2-10 MG/5ML syrup, Take 5 mLs by mouth 4 (four) times daily as needed., Disp: 240 mL, Rfl: 0   cetirizine (ZYRTEC ALLERGY) 10 MG tablet, Take 1 tablet (10 mg total) by mouth at bedtime for 15 days., Disp: 15 tablet, Rfl: 0   chlorhexidine (PERIDEX) 0.12 % solution, Use as directed 15 mLs in the mouth or throat 2 (two) times daily., Disp: 120 mL, Rfl: 0   cyclobenzaprine (FLEXERIL) 10 MG tablet, Take 0.5-1 tablets (5-10 mg total) by mouth 3 (three) times daily as needed., Disp: 30 tablet, Rfl: 0   cyclobenzaprine (FLEXERIL) 10 MG tablet, Take 1 tablet (10 mg total) by mouth 3 (three) times daily as needed for muscle spasms., Disp: 30 tablet, Rfl: 0   guaiFENesin (MUCINEX) 600 MG 12 hr tablet, Take 2 tablets (1,200 mg total) by mouth 2 (two) times daily as needed for cough or to loosen phlegm., Disp: 120 tablet, Rfl: 0   ibuprofen (ADVIL) 600 MG tablet, Take 1 tablet (600 mg total) by mouth every 8 (eight) hours as needed., Disp: 30 tablet, Rfl: 0   ipratropium (ATROVENT) 0.03 % nasal spray, Place 2 sprays into both nostrils every 12 (twelve) hours., Disp: 30 mL, Rfl: 0   lidocaine (LIDODERM) 5 %, Place 1 patch onto the skin daily. Remove & Discard patch within 12 hours or as directed by MD, Disp: 30 patch, Rfl: 0   metroNIDAZOLE (METROCREAM) 0.75 % cream, Apply topically 2 (two) times daily., Disp: 45 g, Rfl: 0   naproxen (NAPROSYN) 500 MG  tablet, Take 1 tablet (500 mg total) by mouth 2 (two) times daily with a meal., Disp: 30 tablet, Rfl: 0   naproxen (NAPROSYN) 500 MG tablet, Take 1 tablet (500 mg total) by mouth 2 (two) times daily with a meal., Disp: 30 tablet, Rfl: 0   nicotine (NICODERM CQ - DOSED IN MG/24 HOURS) 14 mg/24hr patch, Place 1 patch (14 mg total) onto the skin daily., Disp: 30 patch, Rfl: 0   olopatadine (PATANOL) 0.1 % ophthalmic solution, Place 1 drop into the left eye 2 (two) times daily., Disp: 5 mL, Rfl: 0   pantoprazole (PROTONIX) 40 MG tablet, TAKE 1 TABLET(40 MG) BY MOUTH DAILY, Disp: 90 tablet, Rfl: 1   promethazine-dextromethorphan (PROMETHAZINE-DM) 6.25-15 MG/5ML syrup, Take 5 mLs by mouth 3 (three) times daily as needed for cough., Disp: 200 mL, Rfl: 0   triamcinolone ointment (KENALOG) 0.5 %, Apply 1 Application topically 2 (two) times daily., Disp: 30 g,  Rfl: 0   valsartan-hydrochlorothiazide (DIOVAN-HCT) 160-25 MG tablet, Take 1 tablet by mouth daily., Disp: 90 tablet, Rfl: 3  Observations/Objective: Patient is well-developed, well-nourished in no acute distress.  Resting comfortably  at home.  Head is normocephalic, atraumatic.  No labored breathing.  Speech is clear and coherent with logical content.  Patient is alert and oriented at baseline.    Assessment and Plan: 1. Neck pain (Primary)  Heat, I will send the 4% lidocaine patches. She is advised to message us  with any problems.   Follow Up Instructions: I discussed the assessment and treatment plan with the patient. The patient was provided an opportunity to ask questions and all were answered. The patient agreed with the plan and demonstrated an understanding of the instructions.  A copy of instructions were sent to the patient via MyChart unless otherwise noted below.     The patient was advised to call back or seek an in-person evaluation if the symptoms worsen or if the condition fails to improve as anticipated.    Breon Rehm,  FNP

## 2023-08-03 NOTE — Patient Instructions (Signed)
 Cervical Sprain A cervical sprain is a stretch or tear in one or more of the ligaments in the neck. Ligaments are the tissues that connect bones to each other. Cervical sprains can range from mild to severe. Severe cervical sprains can cause the spinal bones (vertebrae) in the neck to be unstable. This can result in spinal cord damage and serious nervous system problems. Healing time for a cervical sprain depends on the cause and extent of the injury. Most cervical sprains heal in 4-6 weeks. What are the causes? Cervical sprains may be caused by trauma, such as an injury from a motor vehicle accident, a fall, or a sudden forward and backward whipping movement of the head and neck (whiplash injury). Mild cervical sprains may be caused by wear and tear over time. What increases the risk? You are more likely to get a cervical sprain if: You take part in activities that have a high risk of trauma to the neck. These include contact sports, gymnastics, and diving. You have: Osteoarthritis of the spine. Poor strength and flexibility of the neck. Poor posture. You have had a neck injury in the past. You spend long periods in positions that put stress on the neck, such as sitting at a computer. What are the signs or symptoms? Symptoms of this condition include: Any of these problems in the neck, shoulders, or upper back: Pain or tenderness. Stiffness. Swelling. A burning feeling. Sudden tightening of neck muscles (spasms). Limited ability to move the neck. Headache. Dizziness. Nausea or vomiting. Weakness, numbness, or tingling in a hand or an arm. Symptoms may develop right away after injury or may develop over a few days. In some cases, symptoms may go away with treatment and return (recur) over time. How is this diagnosed? This condition may be diagnosed based on: Your symptoms, medical history, and a physical exam. Any recent injuries or known neck problems that you have, such as arthritis  in the neck. Imaging tests, such as X-rays, an MRI, or a CT scan. How is this treated? This condition is treated by resting and icing the injured area and doing physical therapy exercises to improve movement and strength. Heat therapy may be used 2-3 days after the injury if there is no swelling. Depending on the severity of your condition, treatment may also include: Keeping your neck in place (immobilized) for periods of time. This may be done using: A cervical collar. This supports your chin and the back of your head. A cervical traction device. This is a sling that holds up your head. It removes weight and pressure from your neck. Medicines for pain or other symptoms. Surgery. This is rare. Follow these instructions at home: Medicines Take over-the-counter and prescription medicines only as told by your health care provider. Ask your provider if the medicine prescribed to you: Requires you to avoid driving or using machinery. Can cause constipation. You may need to take these actions to prevent or treat constipation: Drink enough fluid to keep your pee pale yellow. Take over-the-counter or prescription medicines. Eat foods that are high in fiber, such as beans, whole grains, and fresh fruits and vegetables. Limit foods that are high in fat and processed sugars, such as fried or sweet foods. If you have a cervical collar: Wear the collar as told by your provider. Do not remove it unless told. Ask before making any adjustments to your collar. If you have long hair, keep it outside of the collar. If you are allowed to remove the  collar for cleaning and bathing: Follow instructions about how to remove it safely. Clean it by hand with mild soap and water and air-dry it completely. If your collar has removable pads, remove them every 1-2 days and wash them by hand with soap and water. Let them air-dry completely before putting them back in the collar. Tell your provider if your skin under  the collar has irritation or sores. Managing pain, stiffness, and swelling     Use a cervical traction device as told. If told, put ice on the affected area. Put ice in a plastic bag. Place a towel between your skin and the bag. Leave the ice on for 20 minutes, 2-3 times a day. If told, apply heat to the affected area before you exercise or as often as told by your provider. Use the heat source that your provider recommends, such as a moist heat pack or a heating pad. Place a towel between your skin and the heat source. Leave the heat on for 20-30 minutes. If your skin turns bright red, remove the ice or heat right away to prevent skin damage. The risk of damage is higher if you cannot feel pain, heat, or cold. Activity Do not drive while wearing a cervical collar. If you do not have a cervical collar, ask if it is safe to drive while your neck heals. Do not lift anything that is heavier than 10 lb (4.5 kg) until your provider says that it is safe. Rest as told by your provider. Avoid positions and activities that make your symptoms worse. Do physical therapy exercises as told by your provider or physical therapist. Return to your normal activities as told by your provider. Ask your provider what activities are safe for you. General instructions Do not use any products that contain nicotine or tobacco. These products include cigarettes, chewing tobacco, and vaping devices, such as e-cigarettes. These can delay healing. If you need help quitting, ask your provider. Keep all follow-up visits. Your provider will monitor your injury and activity level. How is this prevented? To prevent a cervical sprain from happening again: Use and maintain good posture. Make any needed adjustments to your workstation to help you do this. Exercise regularly as told by your provider or physical therapist. Avoid risky activities that may cause a cervical sprain. Contact a health care provider if: You have  symptoms that get worse or do not get better after 2 weeks of treatment. You have new symptoms. Your pain gets worse or does not get better with medicine. You have sores or irritated skin on your neck from wearing your cervical collar. Get help right away if: You have severe pain. You develop numbness, tingling, or weakness in any part of your body. You cannot move a part of your body (you have paralysis). You have neck pain along with severe dizziness or headache. This information is not intended to replace advice given to you by your health care provider. Make sure you discuss any questions you have with your health care provider. Document Revised: 11/10/2021 Document Reviewed: 11/10/2021 Elsevier Patient Education  2024 [employer].

## 2023-08-06 MED ORDER — LIDOCAINE 4 % EX PTCH: 3.0000 | MEDICATED_PATCH | CUTANEOUS | 0 refills | Status: AC

## 2023-08-06 NOTE — Addendum Note (Signed)
 Addended by: Albertha Huger on: 08/06/2023 05:43 PM   Modules accepted: Orders

## 2023-08-07 ENCOUNTER — Telehealth: Admitting: Physician Assistant

## 2023-08-07 DIAGNOSIS — Z716 Tobacco abuse counseling: Secondary | ICD-10-CM | POA: Diagnosis not present

## 2023-08-07 DIAGNOSIS — F17210 Nicotine dependence, cigarettes, uncomplicated: Secondary | ICD-10-CM | POA: Diagnosis not present

## 2023-08-07 MED ORDER — NICOTINE POLACRILEX 2 MG MT GUM: CHEWING_GUM | OROMUCOSAL | 0 refills | Status: DC

## 2023-08-07 NOTE — Progress Notes (Signed)
 Virtual Visit Consent  Ann Moran, you are scheduled for a virtual visit with a [facility] provider today. Just as with appointments in the office, your consent must be obtained to participate. Your consent will be active for this visit and any virtual visit you may have with one of our providers in the next 365 days. If you have a MyChart account, a copy of this consent can be sent to you electronically.  As this is a virtual visit, video technology does not allow for your provider to perform a traditional examination. This may limit your provider's ability to fully assess your condition. If your provider identifies any concerns that need to be evaluated in person or the need to arrange testing (such as labs, EKG, etc.), we will make arrangements to do so. Although advances in technology are sophisticated, we cannot ensure that it will always work on either your end or our end. If the connection with a video visit is poor, the visit may have to be switched to a telephone visit. With either a video or telephone visit, we are not always able to ensure that we have a secure connection.  By engaging in this virtual visit, you consent to the provision of healthcare and authorize for your insurance to be billed (if applicable) for the services provided during this visit. Depending on your insurance coverage, you may receive a charge related to this service.  I need to obtain your verbal consent now. Are you willing to proceed with your visit today? Ann Moran has provided verbal consent on 08/07/2023 for a virtual visit (video or telephone). Hyla Maillard, [place]  Date: 08/07/2023 6:44 PM  Virtual Visit via Video Note  I, Hyla Maillard, connected with  Ann Moran  ([id number], May 30, 1985) on 08/07/23 at  6:30 PM EDT by a video-enabled telemedicine application and verified that I am speaking with the correct person using two identifiers.  Location: Patient: Virtual  Visit Location Patient: Home Provider: Virtual Visit Location Provider: Home Office   I discussed the limitations of evaluation and management by telemedicine and the availability of in person appointments. The patient expressed understanding and agreed to proceed.    History of Present Illness: Ann Moran is presenting this evening for follow-up regarding smoking cessation efforts. At last visit, she was started on Nicoderm 14 mg patches to use daily as directed. Endorses using as directed and tolerating well without skin irritation or side effect. Has decreased cigarette use to 1/day. Denies any further cigar use. Notes having a hard time giving up AM cigarette as cravings are still strong in the early AM. Notes she has been trying to keep candy around to suck/chew on in place of this but is not helping much. Denies change to sleep. Some increase in overall appetite since further reduction in cigarettes.    Triggers: The following trigger(s) for use has/have been identified: psychological: morning routine  Withdrawal Symptoms: Identified withdrawal symptoms: none.   Observations/Objective: Patient is well-developed, well-nourished in no acute distress.  Resting comfortably  at home.  Head is normocephalic, atraumatic.  No labored breathing.  Speech is clear and coherent with logical content.  Patient is alert and oriented at baseline.   Assessment and Plan: Nicotine dependence, cigarettes, uncomplicated - Plan: nicotine polacrilex (NICORETTE) 2 MG gum  - Patient is currently at the following State of Change: Action -- involved in a quit attempt - Comorbid conditions identified: HTN, OSA - Reviewed impacts of smoking on patient's health.  BP Readings from Last 1 Encounters:  03/20/23 130/80   - Quit date set for tomorrow. - Will have her continue the 14 mg patches for another 2 weeks, adding on 2 mg gum in the morning for AM cravings, using the park and chew method.  - Other resources  including the [employer] and Department of Health and [employer] -- have been included in the patient's written instructions and sent to their MyChart. - Plan for follow-up in 2 weeks.  Follow Up Instructions: I discussed the assessment and treatment plan with the patient. The patient was provided an opportunity to ask questions and all were answered. The patient agreed with the plan and demonstrated an understanding of the instructions.  A copy of instructions were sent to the patient via MyChart unless otherwise noted below.    Time:  I have spent 12 minutes with the patient via telehealth technology in tobacco cessation counseling.    Hyla Maillard, PA-C

## 2023-08-07 NOTE — Patient Instructions (Addendum)
 Kandyce Ort, thank you for joining Hyla Maillard, PA-C for today's virtual visit.  While this provider is not your primary care provider (PCP), if your PCP is located in our provider database this encounter information will be shared with them immediately following your visit.  Consent: (Patient) Ann Moran provided verbal consent for this virtual visit at the beginning of the encounter.  Current Medications:  Current Outpatient Medications:    nicotine polacrilex (NICORETTE) 2 MG gum, Weeks 1 to 6: Chew 1 piece of gum every 1 to 2 hours (maximum: 24 pieces/day); to increase chances of quitting, chew at least 9 pieces/day during the first 6 weeks., Disp: 100 each, Rfl: 0   albuterol (VENTOLIN HFA) 108 (90 Base) MCG/ACT inhaler, Inhale 1-2 puffs into the lungs every 6 (six) hours as needed., Disp: 8 g, Rfl: 0   amLODipine (NORVASC) 10 MG tablet, Take 1 tablet (10 mg total) by mouth daily. TAKE 1 TABLET(10 MG) BY MOUTH DAILY. Please make PCP appointment for more refills., Disp: 90 tablet, Rfl: 1   benzonatate (TESSALON) 100 MG capsule, Take 1-2 capsules (100-200 mg total) by mouth 3 (three) times daily as needed., Disp: 30 capsule, Rfl: 0   Blood Pressure Monitoring (BLOOD PRESSURE CUFF) MISC, Use to check blood pressure once daily., Disp: 1 each, Rfl: 0   brompheniramine-pseudoephedrine-DM 30-2-10 MG/5ML syrup, Take 5 mLs by mouth 4 (four) times daily as needed., Disp: 240 mL, Rfl: 0   cetirizine (ZYRTEC ALLERGY) 10 MG tablet, Take 1 tablet (10 mg total) by mouth at bedtime for 15 days., Disp: 15 tablet, Rfl: 0   chlorhexidine (PERIDEX) 0.12 % solution, Use as directed 15 mLs in the mouth or throat 2 (two) times daily., Disp: 120 mL, Rfl: 0   cyclobenzaprine (FLEXERIL) 10 MG tablet, Take 0.5-1 tablets (5-10 mg total) by mouth 3 (three) times daily as needed., Disp: 30 tablet, Rfl: 0   cyclobenzaprine (FLEXERIL) 10 MG tablet, Take 1 tablet (10 mg total) by mouth 3 (three) times  daily as needed for muscle spasms., Disp: 30 tablet, Rfl: 0   guaiFENesin (MUCINEX) 600 MG 12 hr tablet, Take 2 tablets (1,200 mg total) by mouth 2 (two) times daily as needed for cough or to loosen phlegm., Disp: 120 tablet, Rfl: 0   ibuprofen (ADVIL) 600 MG tablet, Take 1 tablet (600 mg total) by mouth every 8 (eight) hours as needed., Disp: 30 tablet, Rfl: 0   ipratropium (ATROVENT) 0.03 % nasal spray, Place 2 sprays into both nostrils every 12 (twelve) hours., Disp: 30 mL, Rfl: 0   lidocaine (LIDOCAINE PAIN RELIEF) 4 %, Place 3 patches onto the skin daily., Disp: 90 patch, Rfl: 0   lidocaine (LIDODERM) 5 %, Place 1 patch onto the skin daily. Remove & Discard patch within 12 hours or as directed by MD, Disp: 30 patch, Rfl: 0   lidocaine 4 %, Place 3 patches onto the skin daily. 12 hours on and 12 hours off, Disp: 90 patch, Rfl: 0   metroNIDAZOLE (METROCREAM) 0.75 % cream, Apply topically 2 (two) times daily., Disp: 45 g, Rfl: 0   naproxen (NAPROSYN) 500 MG tablet, Take 1 tablet (500 mg total) by mouth 2 (two) times daily with a meal., Disp: 30 tablet, Rfl: 0   naproxen (NAPROSYN) 500 MG tablet, Take 1 tablet (500 mg total) by mouth 2 (two) times daily with a meal., Disp: 30 tablet, Rfl: 0   nicotine (NICODERM CQ - DOSED IN MG/24 HOURS) 14 mg/24hr patch,  Place 1 patch (14 mg total) onto the skin daily., Disp: 30 patch, Rfl: 0   olopatadine (PATANOL) 0.1 % ophthalmic solution, Place 1 drop into the left eye 2 (two) times daily., Disp: 5 mL, Rfl: 0   pantoprazole (PROTONIX) 40 MG tablet, TAKE 1 TABLET(40 MG) BY MOUTH DAILY, Disp: 90 tablet, Rfl: 1   promethazine-dextromethorphan (PROMETHAZINE-DM) 6.25-15 MG/5ML syrup, Take 5 mLs by mouth 3 (three) times daily as needed for cough., Disp: 200 mL, Rfl: 0   triamcinolone ointment (KENALOG) 0.5 %, Apply 1 Application topically 2 (two) times daily., Disp: 30 g, Rfl: 0   valsartan-hydrochlorothiazide (DIOVAN-HCT) 160-25 MG tablet, Take 1 tablet by mouth  daily., Disp: 90 tablet, Rfl: 3   When you are getting low on your smoking cessation medications, schedule your next video appointment as previously discussed. This way we can follow-up with you, make any necessary adjustments/changes, and get next fill of medication sent in to your pharmacy.   Follow-Up: Call back or seek an in-person evaluation if the symptoms worsen or if the condition fails to improve as anticipated.  Other Instructions  Congratulations for your interest in quitting smoking!  Quitting smoking is one of the most important things you can do to protect your health.  We are here to help you! Did you know that if you quit smoking 1 pack per day you could save up to $2550.00 per year?  Medications are not appropriate for everyone depending upon your situation and any health issues you may have. If we do prescribe medication, it will be for 1 month at time and you will be required to have a follow up Video visit at 1 month to assess how you are doing and if there are any side effects.  This could be for up to a total of 3 months.    Support from your friends, family and work colleagues is very important. Please let them know that you are trying to stop smoking so that they understand your need for support in this goal!  - Remove tobacco products from your environment - Set a quit date ideally within 2 weeks. - You should totally abstain from smoking after your quit date. A single puff could hurt your progress or cause you to relapse - If there are others in your household that smoke, ask them to try to quit or abstain from smoking in your presence  You may notice nicotine withdrawal symptoms such as increased appetite and weight gain, changes in mood, insomnia, irritability and/or anxiety. These symptoms peak in the first three days after smoking cessation and subside over the next 3-4 weeks.   I have prescribed Nicotine Gum with the following directions: I have sent in a 2 mg  dose for you to take as follows:Aaron Aas Use as needed for AM cravings. Chew the gum slowly - using the "chew" and "park" method. Chew the gum until the nicotine taste appears, then "park" the gum until the taste disappears, then chew again to release more nicotine. Chew the gum for 30 minutes.  A combination of behavioral and medication can improve the success of you quitting smoking. You may want to use the 1-800-QUIT-NOW free support line.  Also, the Department of Health and Human Services provides Smoke free apps for smartphones: SharedCustomer[employer]fi  If you happen to break your plan and have a cigarette, keep taking your medications and continue to try to abstain.  Do not give up!  MAKE SURE YOU  Take any prescribed medications  only as instructed.  If you miss a dose of medication, take the next dose when it is due and get back on schedule. DO NOT double up on medications.  Mark your calendar to do your Follow Up Smoking Cessation Visit in one month   If you have been instructed to have an in-person evaluation today at a local Urgent Care facility, please use the link below. It will take you to a list of all of our available [facility], including address, phone number and hours of operation. Please do not delay care.  [facility]  If you or a family member do not have a primary care provider, use the link below to schedule a visit and establish care. When you choose a [facility] primary care physician or advanced practice provider, you gain a long-term partner in health. Find a Primary Care Provider  Learn more about [facility]'s in-office and virtual care options: [facility] - Get Care Now

## 2023-08-09 ENCOUNTER — Other Ambulatory Visit: Payer: Self-pay | Admitting: Nurse Practitioner

## 2023-08-09 DIAGNOSIS — J069 Acute upper respiratory infection, unspecified: Secondary | ICD-10-CM

## 2023-08-22 ENCOUNTER — Telehealth: Admitting: Physician Assistant

## 2023-08-22 DIAGNOSIS — H00012 Hordeolum externum right lower eyelid: Secondary | ICD-10-CM

## 2023-08-22 DIAGNOSIS — H1013 Acute atopic conjunctivitis, bilateral: Secondary | ICD-10-CM | POA: Diagnosis not present

## 2023-08-22 MED ORDER — OLOPATADINE HCL 0.1 % OP SOLN: 1.0000 [drp] | Freq: Two times a day (BID) | OPHTHALMIC | 0 refills | Status: AC

## 2023-08-22 MED ORDER — ERYTHROMYCIN 5 MG/GM OP OINT
1.0000 | TOPICAL_OINTMENT | Freq: Every day | OPHTHALMIC | 0 refills | Status: AC
Start: 2023-08-22 — End: 2023-08-27

## 2023-08-22 NOTE — Patient Instructions (Signed)
 Ann Moran, thank you for joining Angelia Kelp, PA-C for today's virtual visit.  While this provider is not your primary care provider (PCP), if your PCP is located in our provider database this encounter information will be shared with them immediately following your visit.   A [facility] MyChart account gives you access to today's visit and all your visits, tests, and labs performed at [facility] " click here if you don't have a [facility] MyChart account or go to [email or web address]  Consent: (Patient) Ann Moran provided verbal consent for this virtual visit at the beginning of the encounter.  Current Medications:  Current Outpatient Medications:    erythromycin  ophthalmic ointment, Place 1 Application into the right eye at bedtime for 5 days., Disp: 5 g, Rfl: 0   olopatadine  (PATANOL) 0.1 % ophthalmic solution, Place 1 drop into both eyes 2 (two) times daily., Disp: 5 mL, Rfl: 0   albuterol  (VENTOLIN  HFA) 108 (90 Base) MCG/ACT inhaler, Inhale 1-2 puffs into the lungs every 6 (six) hours as needed., Disp: 8 g, Rfl: 0   amLODipine  (NORVASC ) 10 MG tablet, Take 1 tablet (10 mg total) by mouth daily. TAKE 1 TABLET(10 MG) BY MOUTH DAILY. Please make PCP appointment for more refills., Disp: 90 tablet, Rfl: 1   benzonatate  (TESSALON ) 100 MG capsule, Take 1-2 capsules (100-200 mg total) by mouth 3 (three) times daily as needed., Disp: 30 capsule, Rfl: 0   Blood Pressure Monitoring (BLOOD PRESSURE CUFF) MISC, Use to check blood pressure once daily., Disp: 1 each, Rfl: 0   brompheniramine-pseudoephedrine -DM 30-2-10 MG/5ML syrup, Take 5 mLs by mouth 4 (four) times daily as needed., Disp: 240 mL, Rfl: 0   cetirizine  (ZYRTEC  ALLERGY) 10 MG tablet, Take 1 tablet (10 mg total) by mouth at bedtime for 15 days., Disp: 15 tablet, Rfl: 0   chlorhexidine  (PERIDEX ) 0.12 % solution, Use as directed 15 mLs in the mouth or throat 2 (two) times daily., Disp: 120 mL, Rfl:  0   cyclobenzaprine  (FLEXERIL ) 10 MG tablet, Take 0.5-1 tablets (5-10 mg total) by mouth 3 (three) times daily as needed., Disp: 30 tablet, Rfl: 0   cyclobenzaprine  (FLEXERIL ) 10 MG tablet, Take 1 tablet (10 mg total) by mouth 3 (three) times daily as needed for muscle spasms., Disp: 30 tablet, Rfl: 0   guaiFENesin  (MUCINEX ) 600 MG 12 hr tablet, Take 2 tablets (1,200 mg total) by mouth 2 (two) times daily as needed for cough or to loosen phlegm., Disp: 120 tablet, Rfl: 0   ibuprofen  (ADVIL ) 600 MG tablet, Take 1 tablet (600 mg total) by mouth every 8 (eight) hours as needed., Disp: 30 tablet, Rfl: 0   ipratropium (ATROVENT ) 0.03 % nasal spray, Place 2 sprays into both nostrils every 12 (twelve) hours., Disp: 30 mL, Rfl: 0   lidocaine  (LIDOCAINE  PAIN RELIEF) 4 %, Place 3 patches onto the skin daily., Disp: 90 patch, Rfl: 0   lidocaine  (LIDODERM ) 5 %, Place 1 patch onto the skin daily. Remove & Discard patch within 12 hours or as directed by MD, Disp: 30 patch, Rfl: 0   lidocaine  4 %, Place 3 patches onto the skin daily. 12 hours on and 12 hours off, Disp: 90 patch, Rfl: 0   metroNIDAZOLE  (METROCREAM ) 0.75 % cream, Apply topically 2 (two) times daily., Disp: 45 g, Rfl: 0   naproxen  (NAPROSYN ) 500 MG tablet, Take 1 tablet (500 mg total) by mouth 2 (two) times daily with a meal., Disp: 30 tablet, Rfl: 0  naproxen  (NAPROSYN ) 500 MG tablet, Take 1 tablet (500 mg total) by mouth 2 (two) times daily with a meal., Disp: 30 tablet, Rfl: 0   nicotine  polacrilex (NICORETTE ) 2 MG gum, Weeks 1 to 6: Chew 1 piece of gum every 1 to 2 hours (maximum: 24 pieces/day); to increase chances of quitting, chew at least 9 pieces/day during the first 6 weeks., Disp: 100 each, Rfl: 0   pantoprazole  (PROTONIX ) 40 MG tablet, TAKE 1 TABLET(40 MG) BY MOUTH DAILY, Disp: 90 tablet, Rfl: 1   promethazine -dextromethorphan (PROMETHAZINE -DM) 6.25-15 MG/5ML syrup, Take 5 mLs by mouth 3 (three) times daily as needed for cough., Disp: 200  mL, Rfl: 0   triamcinolone  ointment (KENALOG ) 0.5 %, Apply 1 Application topically 2 (two) times daily., Disp: 30 g, Rfl: 0   valsartan -hydrochlorothiazide  (DIOVAN -HCT) 160-25 MG tablet, Take 1 tablet by mouth daily., Disp: 90 tablet, Rfl: 3   Medications ordered in this encounter:  Meds ordered this encounter  Medications   olopatadine  (PATANOL) 0.1 % ophthalmic solution    Sig: Place 1 drop into both eyes 2 (two) times daily.    Dispense:  5 mL    Refill:  0    Supervising Provider:   LAMPTEY, PHILIP O [[id number]]   erythromycin  ophthalmic ointment    Sig: Place 1 Application into the right eye at bedtime for 5 days.    Dispense:  5 g    Refill:  0    Supervising Provider:   Corine Dice [[id number]]     *If you need refills on other medications prior to your next appointment, please contact your pharmacy*  Follow-Up: Call back or seek an in-person evaluation if the symptoms worsen or if the condition fails to improve as anticipated.  [facility] [phone]  Other Instructions  Allergic Conjunctivitis, Adult Allergic conjunctivitis is inflammation of the conjunctiva. The conjunctiva is the thin, clear membrane that covers the white part of the eye and the inner surface of the eyelid. Allergies can affect this layer of the eye. In this condition: The blood vessels in the conjunctiva swell and become irritated. The eyes become red or pink and feel itchy. There is often a watery discharge from the eyes. Allergic conjunctivitis is not contagious. This means it cannot be spread from person to person. The condition can develop at any age and may be outgrown. What are the causes? This condition is caused by allergens. These are things that can cause an allergic reaction in some people. Common allergens include: Outdoor allergens, such as: Pollen, including pollen from grass and weeds. Mold spores. Car fumes. Pollution. Indoor allergens, such  as: Dust. Smoke. Mold spores. Proteins in a pet's urine, saliva, or dander. Protein buildup on contact lenses. What increases the risk? You may be more likely to develop this condition if you have a family history of these things: Allergies. Conditions caused by being exposed to allergens, such as: Allergic rhinitis. This is an allergic reaction that affects the nose. Bronchial asthma. This condition affects the large airways in the lungs and makes breathing difficult. Atopic dermatitis (eczema). This is inflammation of the skin that is long-term (chronic). What are the signs or symptoms? Symptoms of this condition include eyes that are itchy, red, watery, or puffy. Your eyes may also: Sting or burn. Have clear fluid draining from them. Have thick mucus discharge and pain (vernal conjunctivitis). This happens in severe cases. How is this diagnosed? This condition may be diagnosed based on: Your medical  history. A physical exam, including an eye exam. Tests of the fluid draining from your eyes to rule out other causes. Other tests to confirm the diagnosis, including: Testing for allergies. The skin may be pricked with a tiny needle. The pricked area is then exposed to small amounts of allergens. Testing for other eye conditions. Tests may include: Blood tests. Tissue scrapings from your eyelid. The tissue is then checked under a microscope. How is this treated? Treatment for this condition may include: Using cold, wet cloths (cold compresses) to soothe itching and swelling. Washing your face and hair. Also, washing your clothes often to remove allergens. Using eye drops. These may be prescription or over-the-counter. You may need to try different types to see which one works best for you. Examples include: Eye drops that wash allergens out of the eyes (preservative-free artificial tears). Eye drops that block the allergic reaction (antihistamine). Eye drops that reduce swelling and  irritation (anti-inflammatory). Steroid eye drops, which may be given if other treatments have not worked. Oral antihistamine medicines. These are medicines taken by mouth to lessen your allergic reaction. You may need these if eye drops do not help or are difficult to use. An air purifier at home and work. Wraparound sunglasses. This may help to decrease the amount of allergens reaching the eye. Not wearing contact lenses until symptoms improve, if the condition was caused by contact lenses. Change to daily wear disposable contact lenses, if possible. Follow these instructions at home: Eye care Apply a clean, cold compress to your eyes for 10-20 minutes, 3-4 times a day. Do not touch or rub your eyes. Do not wear contact lenses until the inflammation is gone. Wear glasses instead. Do not wear eye makeup until the inflammation is gone. General instructions Avoid known allergens whenever possible. Take or apply over-the-counter and prescription medicines only as told by your health care provider. These include any eye drops. Drink enough fluid to keep your urine pale yellow. Keep all follow-up visits. Contact a health care provider if: Your symptoms get worse or do not get better with treatment. You have mild eye pain. You become sensitive to light. You have spots or blisters on your eyes. You have a fever. Get help right away if: You have redness, swelling, or other symptoms in only one eye. Your vision is blurred or you have other vision changes. You have pus draining from your eyes. You have severe eye pain. Summary Allergic conjunctivitis is inflammation of the eye that is caused by allergens. It affects the clear membrane that covers the white part of the eye and the inner surface of the eyelid. It often causes eye itching, redness, and a watery discharge. Take or apply over-the-counter and prescription medicines only as told by your health care provider. These include eye  drops. Do not touch or rub your eyes. Contact a health care provider if your symptoms get worse or do not get better with treatment. This information is not intended to replace advice given to you by your health care provider. Make sure you discuss any questions you have with your health care provider. Document Revised: 06/19/2021 Document Reviewed: 06/19/2021 Elsevier Patient Education  2024 Elsevier Inc.   Stye A stye, also known as a hordeolum, is a bump that forms on an eyelid. It may look like a pimple next to the eyelash. A stye can form inside the eyelid (internal stye) or outside the eyelid (external stye). A stye can cause redness, swelling, and pain on  the eyelid. Styes are very common. Anyone can get them at any age. They usually occur in just one eye at a time, but you may have more than one in either eye. What are the causes? A stye is caused by an infection. The infection is almost always caused by bacteria called Staphylococcus aureus. This is a common type of bacteria that lives on the skin. An internal stye may result from an infected oil-producing gland inside the eyelid. An external stye may be caused by an infection at the base of the eyelash (hair follicle). What increases the risk? You are more likely to develop a stye if: You have had a stye before. You have any of these conditions: Red, itchy, inflamed eyelids (blepharitis). A skin condition such as seborrheic dermatitis or rosacea. High fat levels in your blood (lipids). Dry eyes. What are the signs or symptoms? The most common symptom of a stye is eyelid pain. Internal styes are more painful than external styes. Other symptoms may include: Painful swelling of your eyelid. A scratchy feeling in your eye. Tearing and redness of your eye. A pimple-like bump on the edge of the eyelid. Pus draining from the stye. How is this diagnosed? Your health care provider may be able to diagnose a stye just by examining your  eye. The health care provider may also check to make sure: You do not have a fever or other signs of a more serious infection. The infection has not spread to other parts of your eye or areas around your eye. How is this treated? Most styes will clear up in a few days without treatment or with warm compresses applied to the area. You may need to use antibiotic drops or ointment to treat an infection. Sometimes, steroid drops or ointment are used in addition to antibiotics. In some cases, your health care provider may give you a small steroid injection in the eyelid. If your stye does not heal with routine treatment, your health care provider may drain pus from the stye using a thin blade or needle. This may be done if the stye is large, causing a lot of pain, or affecting your vision. Follow these instructions at home: Take over-the-counter and prescription medicines only as told by your health care provider. This includes eye drops or ointments. If you were prescribed an antibiotic medicine, steroid medicine, or both, apply or use them as told by your health care provider. Do not stop using the medicine even if your condition improves. Apply a warm, wet cloth (warm compress) to your eye for 5-10 minutes, 4 to 6 times a day. Clean the affected eyelid as directed by your health care provider. Do not wear contact lenses or eye makeup until your stye has healed and your health care provider says that it is safe. Do not try to pop or drain the stye. Do not rub your eye. Contact a health care provider if: You have chills or a fever. Your stye does not go away after several days. Your stye affects your vision. Your eyeball becomes swollen, red, or painful. Get help right away if: You have pain when moving your eye around. Summary A stye is a bump that forms on an eyelid. It may look like a pimple next to the eyelash. A stye can form inside the eyelid (internal stye) or outside the eyelid (external  stye). A stye can cause redness, swelling, and pain on the eyelid. Your health care provider may be able to diagnose a  stye just by examining your eye. Apply a warm, wet cloth (warm compress) to your eye for 5-10 minutes, 4 to 6 times a day. This information is not intended to replace advice given to you by your health care provider. Make sure you discuss any questions you have with your health care provider. Document Revised: 06/15/2020 Document Reviewed: 06/15/2020 Elsevier Patient Education  2024 Elsevier Inc.   If you have been instructed to have an in-person evaluation today at a local Urgent Care facility, please use the link below. It will take you to a list of all of our available [facility], including address, phone number and hours of operation. Please do not delay care.  [facility]  If you or a family member do not have a primary care provider, use the link below to schedule a visit and establish care. When you choose a [facility] primary care physician or advanced practice provider, you gain a long-term partner in health. Find a Primary Care Provider  Learn more about [facility]'s in-office and virtual care options: [facility] - Get Care Now

## 2023-08-22 NOTE — Progress Notes (Signed)
 Virtual Visit Consent   Ann Moran, you are scheduled for a virtual visit with a [facility] provider today. Just as with appointments in the office, your consent must be obtained to participate. Your consent will be active for this visit and any virtual visit you may have with one of our providers in the next 365 days. If you have a MyChart account, a copy of this consent can be sent to you electronically.  As this is a virtual visit, video technology does not allow for your provider to perform a traditional examination. This may limit your provider's ability to fully assess your condition. If your provider identifies any concerns that need to be evaluated in person or the need to arrange testing (such as labs, EKG, etc.), we will make arrangements to do so. Although advances in technology are sophisticated, we cannot ensure that it will always work on either your end or our end. If the connection with a video visit is poor, the visit may have to be switched to a telephone visit. With either a video or telephone visit, we are not always able to ensure that we have a secure connection.  By engaging in this virtual visit, you consent to the provision of healthcare and authorize for your insurance to be billed (if applicable) for the services provided during this visit. Depending on your insurance coverage, you may receive a charge related to this service.  I need to obtain your verbal consent now. Are you willing to proceed with your visit today? Ann Moran has provided verbal consent on 08/22/2023 for a virtual visit (video or telephone). Angelia Kelp, PA-C  Date: 08/22/2023 11:06 AM   Virtual Visit via Video Note   I, Angelia Kelp, connected with  Ann Moran  ([id number], 1985-07-14) on 08/22/23 at 10:45 AM EDT by a video-enabled telemedicine application and verified that I am speaking with the correct person using two identifiers.  Location: Patient: Virtual  Visit Location Patient: Home Provider: Virtual Visit Location Provider: Home Office   I discussed the limitations of evaluation and management by telemedicine and the availability of in person appointments. The patient expressed understanding and agreed to proceed.    History of Present Illness: Ann Moran is a 38 y.o. who identifies as a female who was assigned female at birth, and is being seen today for eye itching and stye.  HPI: Eye Problem  Both eyes are affected. This is a new problem. The current episode started more than 1 month ago. The problem occurs constantly. The problem has been unchanged. There was no injury mechanism. The pain is mild (mild pain in right lower lid today with swelling). There is No known exposure to pink eye. She Does not wear contacts. Associated symptoms include an eye discharge (watering), eye redness, a foreign body sensation (just today in the right lower) and itching. Pertinent negatives include no blurred vision, double vision, fever, nausea or photophobia. She has tried nothing for the symptoms. The treatment provided no relief.      Problems:  Patient Active Problem List   Diagnosis Date Noted   Sinusitis, acute 01/31/2022   Migraine 01/31/2022   Anxiety and depression 01/31/2022   OSA (obstructive sleep apnea) 01/25/2022   Smoking 02/23/2021   Patellar tendinitis of both knees 01/27/2021   Pain in joint involving right ankle and foot 01/27/2021   Thoracic back pain 12/23/2020   Right elbow pain 12/23/2020   Chronic pain of right knee  12/23/2020   Ankle fracture, left 08/09/2020   Dysphagia 05/20/2019   Encounter for other preprocedural examination 05/20/2019   PCOS (polycystic ovarian syndrome) 02/24/2019   Neck pain 02/24/2019   Morbid obesity with body mass index (BMI) of 45.0 to 49.9 in adult [facility]) 02/24/2019   Essential hypertension 02/24/2019   Gastroesophageal reflux disease 02/24/2019   Irregular menstrual cycle 01/14/2019    Abnormal uterine bleeding 01/14/2019   Chronic cough 01/14/2019    Allergies:  Allergies  Allergen Reactions   Prednisone  Swelling    Had neck and shoulder swelling following taking   Medications:  Current Outpatient Medications:    erythromycin  ophthalmic ointment, Place 1 Application into the right eye at bedtime for 5 days., Disp: 5 g, Rfl: 0   olopatadine  (PATANOL) 0.1 % ophthalmic solution, Place 1 drop into both eyes 2 (two) times daily., Disp: 5 mL, Rfl: 0   albuterol  (VENTOLIN  HFA) 108 (90 Base) MCG/ACT inhaler, Inhale 1-2 puffs into the lungs every 6 (six) hours as needed., Disp: 8 g, Rfl: 0   amLODipine  (NORVASC ) 10 MG tablet, Take 1 tablet (10 mg total) by mouth daily. TAKE 1 TABLET(10 MG) BY MOUTH DAILY. Please make PCP appointment for more refills., Disp: 90 tablet, Rfl: 1   benzonatate  (TESSALON ) 100 MG capsule, Take 1-2 capsules (100-200 mg total) by mouth 3 (three) times daily as needed., Disp: 30 capsule, Rfl: 0   Blood Pressure Monitoring (BLOOD PRESSURE CUFF) MISC, Use to check blood pressure once daily., Disp: 1 each, Rfl: 0   brompheniramine-pseudoephedrine -DM 30-2-10 MG/5ML syrup, Take 5 mLs by mouth 4 (four) times daily as needed., Disp: 240 mL, Rfl: 0   cetirizine  (ZYRTEC  ALLERGY) 10 MG tablet, Take 1 tablet (10 mg total) by mouth at bedtime for 15 days., Disp: 15 tablet, Rfl: 0   chlorhexidine  (PERIDEX ) 0.12 % solution, Use as directed 15 mLs in the mouth or throat 2 (two) times daily., Disp: 120 mL, Rfl: 0   cyclobenzaprine  (FLEXERIL ) 10 MG tablet, Take 0.5-1 tablets (5-10 mg total) by mouth 3 (three) times daily as needed., Disp: 30 tablet, Rfl: 0   cyclobenzaprine  (FLEXERIL ) 10 MG tablet, Take 1 tablet (10 mg total) by mouth 3 (three) times daily as needed for muscle spasms., Disp: 30 tablet, Rfl: 0   guaiFENesin  (MUCINEX ) 600 MG 12 hr tablet, Take 2 tablets (1,200 mg total) by mouth 2 (two) times daily as needed for cough or to loosen phlegm., Disp: 120 tablet,  Rfl: 0   ibuprofen  (ADVIL ) 600 MG tablet, Take 1 tablet (600 mg total) by mouth every 8 (eight) hours as needed., Disp: 30 tablet, Rfl: 0   ipratropium (ATROVENT ) 0.03 % nasal spray, Place 2 sprays into both nostrils every 12 (twelve) hours., Disp: 30 mL, Rfl: 0   lidocaine  (LIDOCAINE  PAIN RELIEF) 4 %, Place 3 patches onto the skin daily., Disp: 90 patch, Rfl: 0   lidocaine  (LIDODERM ) 5 %, Place 1 patch onto the skin daily. Remove & Discard patch within 12 hours or as directed by MD, Disp: 30 patch, Rfl: 0   lidocaine  4 %, Place 3 patches onto the skin daily. 12 hours on and 12 hours off, Disp: 90 patch, Rfl: 0   metroNIDAZOLE  (METROCREAM ) 0.75 % cream, Apply topically 2 (two) times daily., Disp: 45 g, Rfl: 0   naproxen  (NAPROSYN ) 500 MG tablet, Take 1 tablet (500 mg total) by mouth 2 (two) times daily with a meal., Disp: 30 tablet, Rfl: 0   naproxen  (NAPROSYN ) 500 MG  tablet, Take 1 tablet (500 mg total) by mouth 2 (two) times daily with a meal., Disp: 30 tablet, Rfl: 0   nicotine  polacrilex (NICORETTE ) 2 MG gum, Weeks 1 to 6: Chew 1 piece of gum every 1 to 2 hours (maximum: 24 pieces/day); to increase chances of quitting, chew at least 9 pieces/day during the first 6 weeks., Disp: 100 each, Rfl: 0   pantoprazole  (PROTONIX ) 40 MG tablet, TAKE 1 TABLET(40 MG) BY MOUTH DAILY, Disp: 90 tablet, Rfl: 1   promethazine -dextromethorphan (PROMETHAZINE -DM) 6.25-15 MG/5ML syrup, Take 5 mLs by mouth 3 (three) times daily as needed for cough., Disp: 200 mL, Rfl: 0   triamcinolone  ointment (KENALOG ) 0.5 %, Apply 1 Application topically 2 (two) times daily., Disp: 30 g, Rfl: 0   valsartan -hydrochlorothiazide  (DIOVAN -HCT) 160-25 MG tablet, Take 1 tablet by mouth daily., Disp: 90 tablet, Rfl: 3  Observations/Objective: Patient is well-developed, well-nourished in no acute distress.  Resting comfortably at home.  Head is normocephalic, atraumatic.  No labored breathing.  Speech is clear and coherent with logical  content.  Patient is alert and oriented at baseline.  Mild swelling near lacrimal corner of the right lower lid c/w a stye Bilateral eyes injected and itch per patient  Assessment and Plan: 1. Allergic conjunctivitis of both eyes (Primary) - olopatadine  (PATANOL) 0.1 % ophthalmic solution; Place 1 drop into both eyes 2 (two) times daily.  Dispense: 5 mL; Refill: 0  2. Hordeolum externum of right lower eyelid - erythromycin  ophthalmic ointment; Place 1 Application into the right eye at bedtime for 5 days.  Dispense: 5 g; Refill: 0  - Suspect allergic conjunctivitis - Olopatadine  eye drops prescribed - Erythromycin  ointment for stye of right eye - Warm compresses - Good hand hygiene - Seek in person evaluation if symptoms worsen or fail to improve   Follow Up Instructions: I discussed the assessment and treatment plan with the patient. The patient was provided an opportunity to ask questions and all were answered. The patient agreed with the plan and demonstrated an understanding of the instructions.  A copy of instructions were sent to the patient via MyChart unless otherwise noted below.    The patient was advised to call back or seek an in-person evaluation if the symptoms worsen or if the condition fails to improve as anticipated.    Angelia Kelp, PA-C

## 2023-08-24 ENCOUNTER — Telehealth: Admitting: Family Medicine

## 2023-08-24 DIAGNOSIS — H00012 Hordeolum externum right lower eyelid: Secondary | ICD-10-CM | POA: Diagnosis not present

## 2023-08-24 DIAGNOSIS — H1013 Acute atopic conjunctivitis, bilateral: Secondary | ICD-10-CM | POA: Diagnosis not present

## 2023-08-24 NOTE — Progress Notes (Signed)
 Virtual Visit Consent   Ann Moran, you are scheduled for a virtual visit with a [facility] provider today. Just as with appointments in the office, your consent must be obtained to participate. Your consent will be active for this visit and any virtual visit you may have with one of our providers in the next 365 days. If you have a MyChart account, a copy of this consent can be sent to you electronically.  As this is a virtual visit, video technology does not allow for your provider to perform a traditional examination. This may limit your provider's ability to fully assess your condition. If your provider identifies any concerns that need to be evaluated in person or the need to arrange testing (such as labs, EKG, etc.), we will make arrangements to do so. Although advances in technology are sophisticated, we cannot ensure that it will always work on either your end or our end. If the connection with a video visit is poor, the visit may have to be switched to a telephone visit. With either a video or telephone visit, we are not always able to ensure that we have a secure connection.  By engaging in this virtual visit, you consent to the provision of healthcare and authorize for your insurance to be billed (if applicable) for the services provided during this visit. Depending on your insurance coverage, you may receive a charge related to this service.  I need to obtain your verbal consent now. Are you willing to proceed with your visit today? Ann Moran has provided verbal consent on 08/24/2023 for a virtual visit (video or telephone). Louvenia Roys, [place]  Date: 08/24/2023 11:29 AM   Virtual Visit via Video Note   I, Louvenia Roys, connected with  Ann Moran  ([id number], July 01, 1985) on 08/24/23 at 11:15 AM EDT by a video-enabled telemedicine application and verified that I am speaking with the correct person using two identifiers.  Location: Patient: Virtual Visit Location  Patient: Home Provider: Virtual Visit Location Provider: Home Office   I discussed the limitations of evaluation and management by telemedicine and the availability of in person appointments. The patient expressed understanding and agreed to proceed.    History of Present Illness: Ann Moran is a 38 y.o. who identifies as a female who was assigned female at birth, and is being seen today for c/o continual eye infection.  Pt states she was seen virtually a couple days ago and prescribed an ointment and eye drops for her eye.  Pt states she received the ointment but bot the eye drops.  Pt states her right eye hurts more today and she noticed more swelling and now has a "head" to the area that she didn't have before.  Pt states her eye is also very itchy.   HPI: HPI  Problems:  Patient Active Problem List   Diagnosis Date Noted   Sinusitis, acute 01/31/2022   Migraine 01/31/2022   Anxiety and depression 01/31/2022   OSA (obstructive sleep apnea) 01/25/2022   Smoking 02/23/2021   Patellar tendinitis of both knees 01/27/2021   Pain in joint involving right ankle and foot 01/27/2021   Thoracic back pain 12/23/2020   Right elbow pain 12/23/2020   Chronic pain of right knee 12/23/2020   Ankle fracture, left 08/09/2020   Dysphagia 05/20/2019   Encounter for other preprocedural examination 05/20/2019   PCOS (polycystic ovarian syndrome) 02/24/2019   Neck pain 02/24/2019   Morbid obesity with body mass index (BMI) of  45.0 to 49.9 in adult [facility] 02/24/2019   Essential hypertension 02/24/2019   Gastroesophageal reflux disease 02/24/2019   Irregular menstrual cycle 01/14/2019   Abnormal uterine bleeding 01/14/2019   Chronic cough 01/14/2019    Allergies:  Allergies  Allergen Reactions   Prednisone  Swelling    Had neck and shoulder swelling following taking   Medications:  Current Outpatient Medications:    albuterol  (VENTOLIN  HFA) 108 (90 Base) MCG/ACT inhaler, Inhale 1-2 puffs  into the lungs every 6 (six) hours as needed., Disp: 8 g, Rfl: 0   amLODipine  (NORVASC ) 10 MG tablet, Take 1 tablet (10 mg total) by mouth daily. TAKE 1 TABLET(10 MG) BY MOUTH DAILY. Please make PCP appointment for more refills., Disp: 90 tablet, Rfl: 1   benzonatate  (TESSALON ) 100 MG capsule, Take 1-2 capsules (100-200 mg total) by mouth 3 (three) times daily as needed., Disp: 30 capsule, Rfl: 0   Blood Pressure Monitoring (BLOOD PRESSURE CUFF) MISC, Use to check blood pressure once daily., Disp: 1 each, Rfl: 0   brompheniramine-pseudoephedrine -DM 30-2-10 MG/5ML syrup, Take 5 mLs by mouth 4 (four) times daily as needed., Disp: 240 mL, Rfl: 0   cetirizine  (ZYRTEC  ALLERGY) 10 MG tablet, Take 1 tablet (10 mg total) by mouth at bedtime for 15 days., Disp: 15 tablet, Rfl: 0   chlorhexidine  (PERIDEX ) 0.12 % solution, Use as directed 15 mLs in the mouth or throat 2 (two) times daily., Disp: 120 mL, Rfl: 0   cyclobenzaprine  (FLEXERIL ) 10 MG tablet, Take 0.5-1 tablets (5-10 mg total) by mouth 3 (three) times daily as needed., Disp: 30 tablet, Rfl: 0   cyclobenzaprine  (FLEXERIL ) 10 MG tablet, Take 1 tablet (10 mg total) by mouth 3 (three) times daily as needed for muscle spasms., Disp: 30 tablet, Rfl: 0   erythromycin  ophthalmic ointment, Place 1 Application into the right eye at bedtime for 5 days., Disp: 5 g, Rfl: 0   guaiFENesin  (MUCINEX ) 600 MG 12 hr tablet, Take 2 tablets (1,200 mg total) by mouth 2 (two) times daily as needed for cough or to loosen phlegm., Disp: 120 tablet, Rfl: 0   ibuprofen  (ADVIL ) 600 MG tablet, Take 1 tablet (600 mg total) by mouth every 8 (eight) hours as needed., Disp: 30 tablet, Rfl: 0   ipratropium (ATROVENT ) 0.03 % nasal spray, Place 2 sprays into both nostrils every 12 (twelve) hours., Disp: 30 mL, Rfl: 0   lidocaine  (LIDOCAINE  PAIN RELIEF) 4 %, Place 3 patches onto the skin daily., Disp: 90 patch, Rfl: 0   lidocaine  (LIDODERM ) 5 %, Place 1 patch onto the skin daily. Remove &  Discard patch within 12 hours or as directed by MD, Disp: 30 patch, Rfl: 0   lidocaine  4 %, Place 3 patches onto the skin daily. 12 hours on and 12 hours off, Disp: 90 patch, Rfl: 0   metroNIDAZOLE  (METROCREAM ) 0.75 % cream, Apply topically 2 (two) times daily., Disp: 45 g, Rfl: 0   naproxen  (NAPROSYN ) 500 MG tablet, Take 1 tablet (500 mg total) by mouth 2 (two) times daily with a meal., Disp: 30 tablet, Rfl: 0   naproxen  (NAPROSYN ) 500 MG tablet, Take 1 tablet (500 mg total) by mouth 2 (two) times daily with a meal., Disp: 30 tablet, Rfl: 0   nicotine  polacrilex (NICORETTE ) 2 MG gum, Weeks 1 to 6: Chew 1 piece of gum every 1 to 2 hours (maximum: 24 pieces/day); to increase chances of quitting, chew at least 9 pieces/day during the first 6 weeks., Disp: 100 each,  Rfl: 0   olopatadine  (PATANOL) 0.1 % ophthalmic solution, Place 1 drop into both eyes 2 (two) times daily., Disp: 5 mL, Rfl: 0   pantoprazole  (PROTONIX ) 40 MG tablet, TAKE 1 TABLET(40 MG) BY MOUTH DAILY, Disp: 90 tablet, Rfl: 1   promethazine -dextromethorphan (PROMETHAZINE -DM) 6.25-15 MG/5ML syrup, Take 5 mLs by mouth 3 (three) times daily as needed for cough., Disp: 200 mL, Rfl: 0   triamcinolone  ointment (KENALOG ) 0.5 %, Apply 1 Application topically 2 (two) times daily., Disp: 30 g, Rfl: 0   valsartan -hydrochlorothiazide  (DIOVAN -HCT) 160-25 MG tablet, Take 1 tablet by mouth daily., Disp: 90 tablet, Rfl: 3  Observations/Objective: Patient is well-developed, well-nourished in no acute distress.  Resting comfortably at home.  Head is normocephalic, atraumatic.  No labored breathing.  Speech is clear and coherent with logical content.  Patient is alert and oriented at baseline.  Hordeolum noted to right lower eyelid   Assessment and Plan: 1. Hordeolum externum of right lower eyelid (Primary)  2. Allergic conjunctivitis of both eyes  -Advised Pt to use warm compresses and wash eye with baby shampoo and warm water  -Pt advised to  seek in person urgent care if symptoms persist or worsen  -Pt advised to purchase [place]  eye drops over the counter as it seems her insurance might have covered the medication.   Follow Up Instructions: I discussed the assessment and treatment plan with the patient. The patient was provided an opportunity to ask questions and all were answered. The patient agreed with the plan and demonstrated an understanding of the instructions.  A copy of instructions were sent to the patient via MyChart unless otherwise noted below.    The patient was advised to call back or seek an in-person evaluation if the symptoms worsen or if the condition fails to improve as anticipated.    Louvenia Roys, PA-C

## 2023-08-24 NOTE — Patient Instructions (Signed)
 Kandyce Ort, thank you for joining Louvenia Roys, PA-C for today's virtual visit.  While this provider is not your primary care provider (PCP), if your PCP is located in our provider database this encounter information will be shared with them immediately following your visit.   A [facility] MyChart account gives you access to today's visit and all your visits, tests, and labs performed at [facility] " click here if you don't have a [facility] MyChart account or go to [email or web address]  Consent: (Patient) Ann Moran provided verbal consent for this virtual visit at the beginning of the encounter.  Current Medications:  Current Outpatient Medications:    albuterol  (VENTOLIN  HFA) 108 (90 Base) MCG/ACT inhaler, Inhale 1-2 puffs into the lungs every 6 (six) hours as needed., Disp: 8 g, Rfl: 0   amLODipine  (NORVASC ) 10 MG tablet, Take 1 tablet (10 mg total) by mouth daily. TAKE 1 TABLET(10 MG) BY MOUTH DAILY. Please make PCP appointment for more refills., Disp: 90 tablet, Rfl: 1   benzonatate  (TESSALON ) 100 MG capsule, Take 1-2 capsules (100-200 mg total) by mouth 3 (three) times daily as needed., Disp: 30 capsule, Rfl: 0   Blood Pressure Monitoring (BLOOD PRESSURE CUFF) MISC, Use to check blood pressure once daily., Disp: 1 each, Rfl: 0   brompheniramine-pseudoephedrine -DM 30-2-10 MG/5ML syrup, Take 5 mLs by mouth 4 (four) times daily as needed., Disp: 240 mL, Rfl: 0   cetirizine  (ZYRTEC  ALLERGY) 10 MG tablet, Take 1 tablet (10 mg total) by mouth at bedtime for 15 days., Disp: 15 tablet, Rfl: 0   chlorhexidine  (PERIDEX ) 0.12 % solution, Use as directed 15 mLs in the mouth or throat 2 (two) times daily., Disp: 120 mL, Rfl: 0   cyclobenzaprine  (FLEXERIL ) 10 MG tablet, Take 0.5-1 tablets (5-10 mg total) by mouth 3 (three) times daily as needed., Disp: 30 tablet, Rfl: 0   cyclobenzaprine  (FLEXERIL ) 10 MG tablet, Take 1 tablet (10 mg total) by mouth 3 (three) times  daily as needed for muscle spasms., Disp: 30 tablet, Rfl: 0   erythromycin  ophthalmic ointment, Place 1 Application into the right eye at bedtime for 5 days., Disp: 5 g, Rfl: 0   guaiFENesin  (MUCINEX ) 600 MG 12 hr tablet, Take 2 tablets (1,200 mg total) by mouth 2 (two) times daily as needed for cough or to loosen phlegm., Disp: 120 tablet, Rfl: 0   ibuprofen  (ADVIL ) 600 MG tablet, Take 1 tablet (600 mg total) by mouth every 8 (eight) hours as needed., Disp: 30 tablet, Rfl: 0   ipratropium (ATROVENT ) 0.03 % nasal spray, Place 2 sprays into both nostrils every 12 (twelve) hours., Disp: 30 mL, Rfl: 0   lidocaine  (LIDOCAINE  PAIN RELIEF) 4 %, Place 3 patches onto the skin daily., Disp: 90 patch, Rfl: 0   lidocaine  (LIDODERM ) 5 %, Place 1 patch onto the skin daily. Remove & Discard patch within 12 hours or as directed by MD, Disp: 30 patch, Rfl: 0   lidocaine  4 %, Place 3 patches onto the skin daily. 12 hours on and 12 hours off, Disp: 90 patch, Rfl: 0   metroNIDAZOLE  (METROCREAM ) 0.75 % cream, Apply topically 2 (two) times daily., Disp: 45 g, Rfl: 0   naproxen  (NAPROSYN ) 500 MG tablet, Take 1 tablet (500 mg total) by mouth 2 (two) times daily with a meal., Disp: 30 tablet, Rfl: 0   naproxen  (NAPROSYN ) 500 MG tablet, Take 1 tablet (500 mg total) by mouth 2 (two) times daily with a meal., Disp: 30  tablet, Rfl: 0   nicotine  polacrilex (NICORETTE ) 2 MG gum, Weeks 1 to 6: Chew 1 piece of gum every 1 to 2 hours (maximum: 24 pieces/day); to increase chances of quitting, chew at least 9 pieces/day during the first 6 weeks., Disp: 100 each, Rfl: 0   olopatadine  (PATANOL) 0.1 % ophthalmic solution, Place 1 drop into both eyes 2 (two) times daily., Disp: 5 mL, Rfl: 0   pantoprazole  (PROTONIX ) 40 MG tablet, TAKE 1 TABLET(40 MG) BY MOUTH DAILY, Disp: 90 tablet, Rfl: 1   promethazine -dextromethorphan (PROMETHAZINE -DM) 6.25-15 MG/5ML syrup, Take 5 mLs by mouth 3 (three) times daily as needed for cough., Disp: 200 mL,  Rfl: 0   triamcinolone  ointment (KENALOG ) 0.5 %, Apply 1 Application topically 2 (two) times daily., Disp: 30 g, Rfl: 0   valsartan -hydrochlorothiazide  (DIOVAN -HCT) 160-25 MG tablet, Take 1 tablet by mouth daily., Disp: 90 tablet, Rfl: 3   Medications ordered in this encounter:  No orders of the defined types were placed in this encounter.    *If you need refills on other medications prior to your next appointment, please contact your pharmacy*  Follow-Up: Call back or seek an in-person evaluation if the symptoms worsen or if the condition fails to improve as anticipated.  [facility] [phone]  Other Instructions Stye A stye, also known as a hordeolum, is a bump that forms on an eyelid. It may look like a pimple next to the eyelash. A stye can form inside the eyelid (internal stye) or outside the eyelid (external stye). A stye can cause redness, swelling, and pain on the eyelid. Styes are very common. Anyone can get them at any age. They usually occur in just one eye at a time, but you may have more than one in either eye. What are the causes? A stye is caused by an infection. The infection is almost always caused by bacteria called Staphylococcus aureus. This is a common type of bacteria that lives on the skin. An internal stye may result from an infected oil-producing gland inside the eyelid. An external stye may be caused by an infection at the base of the eyelash (hair follicle). What increases the risk? You are more likely to develop a stye if: You have had a stye before. You have any of these conditions: Red, itchy, inflamed eyelids (blepharitis). A skin condition such as seborrheic dermatitis or rosacea. High fat levels in your blood (lipids). Dry eyes. What are the signs or symptoms? The most common symptom of a stye is eyelid pain. Internal styes are more painful than external styes. Other symptoms may include: Painful swelling of your eyelid. A scratchy  feeling in your eye. Tearing and redness of your eye. A pimple-like bump on the edge of the eyelid. Pus draining from the stye. How is this diagnosed? Your health care provider may be able to diagnose a stye just by examining your eye. The health care provider may also check to make sure: You do not have a fever or other signs of a more serious infection. The infection has not spread to other parts of your eye or areas around your eye. How is this treated? Most styes will clear up in a few days without treatment or with warm compresses applied to the area. You may need to use antibiotic drops or ointment to treat an infection. Sometimes, steroid drops or ointment are used in addition to antibiotics. In some cases, your health care provider may give you a small steroid injection  in the eyelid. If your stye does not heal with routine treatment, your health care provider may drain pus from the stye using a thin blade or needle. This may be done if the stye is large, causing a lot of pain, or affecting your vision. Follow these instructions at home: Take over-the-counter and prescription medicines only as told by your health care provider. This includes eye drops or ointments. If you were prescribed an antibiotic medicine, steroid medicine, or both, apply or use them as told by your health care provider. Do not stop using the medicine even if your condition improves. Apply a warm, wet cloth (warm compress) to your eye for 5-10 minutes, 4 to 6 times a day. Clean the affected eyelid as directed by your health care provider. Do not wear contact lenses or eye makeup until your stye has healed and your health care provider says that it is safe. Do not try to pop or drain the stye. Do not rub your eye. Contact a health care provider if: You have chills or a fever. Your stye does not go away after several days. Your stye affects your vision. Your eyeball becomes swollen, red, or painful. Get help right  away if: You have pain when moving your eye around. Summary A stye is a bump that forms on an eyelid. It may look like a pimple next to the eyelash. A stye can form inside the eyelid (internal stye) or outside the eyelid (external stye). A stye can cause redness, swelling, and pain on the eyelid. Your health care provider may be able to diagnose a stye just by examining your eye. Apply a warm, wet cloth (warm compress) to your eye for 5-10 minutes, 4 to 6 times a day. This information is not intended to replace advice given to you by your health care provider. Make sure you discuss any questions you have with your health care provider. Document Revised: 06/15/2020 Document Reviewed: 06/15/2020 Elsevier Patient Education  2024 [employer]   If you have been instructed to have an in-person evaluation today at a local Urgent Care facility, please use the link below. It will take you to a list of all of our available [facility], including address, phone number and hours of operation. Please do not delay care.  [facility]  If you or a family member do not have a primary care provider, use the link below to schedule a visit and establish care. When you choose a [facility] primary care physician or advanced practice provider, you gain a long-term partner in health. Find a Primary Care Provider  Learn more about [facility]'s in-office and virtual care options: [facility] - Get Care Now

## 2023-08-31 ENCOUNTER — Telehealth: Admitting: Emergency Medicine

## 2023-08-31 DIAGNOSIS — R229 Localized swelling, mass and lump, unspecified: Secondary | ICD-10-CM | POA: Diagnosis not present

## 2023-08-31 NOTE — Progress Notes (Signed)
 Virtual Visit Consent   Ann Moran, you are scheduled for a virtual visit with a [facility] provider today. Just as with appointments in the office, your consent must be obtained to participate. Your consent will be active for this visit and any virtual visit you may have with one of our providers in the next 365 days. If you have a MyChart account, a copy of this consent can be sent to you electronically.  As this is a virtual visit, video technology does not allow for your provider to perform a traditional examination. This may limit your provider's ability to fully assess your condition. If your provider identifies any concerns that need to be evaluated in person or the need to arrange testing (such as labs, EKG, etc.), we will make arrangements to do so. Although advances in technology are sophisticated, we cannot ensure that it will always work on either your end or our end. If the connection with a video visit is poor, the visit may have to be switched to a telephone visit. With either a video or telephone visit, we are not always able to ensure that we have a secure connection.  By engaging in this virtual visit, you consent to the provision of healthcare and authorize for your insurance to be billed (if applicable) for the services provided during this visit. Depending on your insurance coverage, you may receive a charge related to this service.  I need to obtain your verbal consent now. Are you willing to proceed with your visit today? Ann Moran has provided verbal consent on 08/31/2023 for a virtual visit (video or telephone). [name], NP  Date: 08/31/2023 4:43 PM   Virtual Visit via Video Note   I, [name], connected with  Ann Moran  ([id number], 03/10/1986) on 08/31/23 at  4:30 PM EDT by a video-enabled telemedicine application and verified that I am speaking with the correct person using two identifiers.  Location: Patient: Virtual Visit  Location Patient: Home Provider: Virtual Visit Location Provider: Home Office   I discussed the limitations of evaluation and management by telemedicine and the availability of in person appointments. The patient expressed understanding and agreed to proceed.    History of Present Illness: Ann Moran is a 38 y.o. who identifies as a female who was assigned female at birth, and is being seen today for sore and burning area on her right posterior hip that started this morning.  She reports that she accidentally slept on hard knob/toy and noticed the problem this morning.  She said the area is soft and squishy it is not hard compared to the rest of her skin.  She does not think it is an abscess.  She took Ibuprofen  and applied tylenol  topical, but these have not helped her symptoms.  HPI: HPI  Problems:  Patient Active Problem List   Diagnosis Date Noted   Sinusitis, acute 01/31/2022   Migraine 01/31/2022   Anxiety and depression 01/31/2022   OSA (obstructive sleep apnea) 01/25/2022   Smoking 02/23/2021   Patellar tendinitis of both knees 01/27/2021   Pain in joint involving right ankle and foot 01/27/2021   Thoracic back pain 12/23/2020   Right elbow pain 12/23/2020   Chronic pain of right knee 12/23/2020   Ankle fracture, left 08/09/2020   Dysphagia 05/20/2019   Encounter for other preprocedural examination 05/20/2019   PCOS (polycystic ovarian syndrome) 02/24/2019   Neck pain 02/24/2019   Morbid obesity with body mass index (  BMI) of 45.0 to 49.9 in adult [facility]) 02/24/2019   Essential hypertension 02/24/2019   Gastroesophageal reflux disease 02/24/2019   Irregular menstrual cycle 01/14/2019   Abnormal uterine bleeding 01/14/2019   Chronic cough 01/14/2019    Allergies:  Allergies  Allergen Reactions   Prednisone  Swelling    Had neck and shoulder swelling following taking   Medications:  Current Outpatient Medications:    albuterol  (VENTOLIN  HFA) 108 (90 Base) MCG/ACT  inhaler, Inhale 1-2 puffs into the lungs every 6 (six) hours as needed., Disp: 8 g, Rfl: 0   amLODipine  (NORVASC ) 10 MG tablet, Take 1 tablet (10 mg total) by mouth daily. TAKE 1 TABLET(10 MG) BY MOUTH DAILY. Please make PCP appointment for more refills., Disp: 90 tablet, Rfl: 1   benzonatate  (TESSALON ) 100 MG capsule, Take 1-2 capsules (100-200 mg total) by mouth 3 (three) times daily as needed., Disp: 30 capsule, Rfl: 0   Blood Pressure Monitoring (BLOOD PRESSURE CUFF) MISC, Use to check blood pressure once daily., Disp: 1 each, Rfl: 0   brompheniramine-pseudoephedrine -DM 30-2-10 MG/5ML syrup, Take 5 mLs by mouth 4 (four) times daily as needed., Disp: 240 mL, Rfl: 0   cetirizine  (ZYRTEC  ALLERGY) 10 MG tablet, Take 1 tablet (10 mg total) by mouth at bedtime for 15 days., Disp: 15 tablet, Rfl: 0   chlorhexidine  (PERIDEX ) 0.12 % solution, Use as directed 15 mLs in the mouth or throat 2 (two) times daily., Disp: 120 mL, Rfl: 0   cyclobenzaprine  (FLEXERIL ) 10 MG tablet, Take 0.5-1 tablets (5-10 mg total) by mouth 3 (three) times daily as needed., Disp: 30 tablet, Rfl: 0   cyclobenzaprine  (FLEXERIL ) 10 MG tablet, Take 1 tablet (10 mg total) by mouth 3 (three) times daily as needed for muscle spasms., Disp: 30 tablet, Rfl: 0   guaiFENesin  (MUCINEX ) 600 MG 12 hr tablet, Take 2 tablets (1,200 mg total) by mouth 2 (two) times daily as needed for cough or to loosen phlegm., Disp: 120 tablet, Rfl: 0   ibuprofen  (ADVIL ) 600 MG tablet, Take 1 tablet (600 mg total) by mouth every 8 (eight) hours as needed., Disp: 30 tablet, Rfl: 0   ipratropium (ATROVENT ) 0.03 % nasal spray, Place 2 sprays into both nostrils every 12 (twelve) hours., Disp: 30 mL, Rfl: 0   lidocaine  (LIDOCAINE  PAIN RELIEF) 4 %, Place 3 patches onto the skin daily., Disp: 90 patch, Rfl: 0   lidocaine  (LIDODERM ) 5 %, Place 1 patch onto the skin daily. Remove & Discard patch within 12 hours or as directed by MD, Disp: 30 patch, Rfl: 0   lidocaine  4 %,  Place 3 patches onto the skin daily. 12 hours on and 12 hours off, Disp: 90 patch, Rfl: 0   metroNIDAZOLE  (METROCREAM ) 0.75 % cream, Apply topically 2 (two) times daily., Disp: 45 g, Rfl: 0   naproxen  (NAPROSYN ) 500 MG tablet, Take 1 tablet (500 mg total) by mouth 2 (two) times daily with a meal., Disp: 30 tablet, Rfl: 0   naproxen  (NAPROSYN ) 500 MG tablet, Take 1 tablet (500 mg total) by mouth 2 (two) times daily with a meal., Disp: 30 tablet, Rfl: 0   nicotine  polacrilex (NICORETTE ) 2 MG gum, Weeks 1 to 6: Chew 1 piece of gum every 1 to 2 hours (maximum: 24 pieces/day); to increase chances of quitting, chew at least 9 pieces/day during the first 6 weeks., Disp: 100 each, Rfl: 0   olopatadine  (PATANOL) 0.1 % ophthalmic solution, Place 1 drop into both eyes 2 (two) times daily.,  Disp: 5 mL, Rfl: 0   pantoprazole  (PROTONIX ) 40 MG tablet, TAKE 1 TABLET(40 MG) BY MOUTH DAILY, Disp: 90 tablet, Rfl: 1   promethazine -dextromethorphan (PROMETHAZINE -DM) 6.25-15 MG/5ML syrup, Take 5 mLs by mouth 3 (three) times daily as needed for cough., Disp: 200 mL, Rfl: 0   triamcinolone  ointment (KENALOG ) 0.5 %, Apply 1 Application topically 2 (two) times daily., Disp: 30 g, Rfl: 0   valsartan -hydrochlorothiazide  (DIOVAN -HCT) 160-25 MG tablet, Take 1 tablet by mouth daily., Disp: 90 tablet, Rfl: 3  Observations/Objective: Patient is well-developed, well-nourished in no acute distress.  Resting comfortably  at home.  Head is normocephalic, atraumatic.  No labored breathing.  Speech is clear and coherent with logical content.  Patient is alert and oriented at baseline.    Assessment and Plan: 1. Skin swelling (Primary)  It is very difficult to see the area on camera.  Based on her description of the problem, where it is swollen and soft compared to her other skin and feels like it is "burning", I wonder if he is having both soft tissue damage from laying on this hard object all night long as well as may be a contact  Derm kind of reaction to what ever material the object is made of  Follow Up Instructions: I discussed the assessment and treatment plan with the patient. The patient was provided an opportunity to ask questions and all were answered. The patient agreed with the plan and demonstrated an understanding of the instructions.  A copy of instructions were sent to the patient via MyChart unless otherwise noted below.   The patient was advised to call back or seek an in-person evaluation if the symptoms worsen or if the condition fails to improve as anticipated.    [name], NP

## 2023-08-31 NOTE — Patient Instructions (Signed)
 Kandyce Ort, thank you for joining [name], NP for today's virtual visit.  While this provider is not your primary care provider (PCP), if your PCP is located in our provider database this encounter information will be shared with them immediately following your visit.   A [place] MyChart account gives you access to today's visit and all your visits, tests, and labs performed at [facility] " click here if you don't have a [place] MyChart account or go to [email or web address]  Consent: (Patient) Ann Moran provided verbal consent for this virtual visit at the beginning of the encounter.  Current Medications:  Current Outpatient Medications:    albuterol  (VENTOLIN  HFA) 108 (90 Base) MCG/ACT inhaler, Inhale 1-2 puffs into the lungs every 6 (six) hours as needed., Disp: 8 g, Rfl: 0   amLODipine  (NORVASC ) 10 MG tablet, Take 1 tablet (10 mg total) by mouth daily. TAKE 1 TABLET(10 MG) BY MOUTH DAILY. Please make PCP appointment for more refills., Disp: 90 tablet, Rfl: 1   benzonatate  (TESSALON ) 100 MG capsule, Take 1-2 capsules (100-200 mg total) by mouth 3 (three) times daily as needed., Disp: 30 capsule, Rfl: 0   Blood Pressure Monitoring (BLOOD PRESSURE CUFF) MISC, Use to check blood pressure once daily., Disp: 1 each, Rfl: 0   brompheniramine-pseudoephedrine -DM 30-2-10 MG/5ML syrup, Take 5 mLs by mouth 4 (four) times daily as needed., Disp: 240 mL, Rfl: 0   cetirizine  (ZYRTEC  ALLERGY) 10 MG tablet, Take 1 tablet (10 mg total) by mouth at bedtime for 15 days., Disp: 15 tablet, Rfl: 0   chlorhexidine  (PERIDEX ) 0.12 % solution, Use as directed 15 mLs in the mouth or throat 2 (two) times daily., Disp: 120 mL, Rfl: 0   cyclobenzaprine  (FLEXERIL ) 10 MG tablet, Take 0.5-1 tablets (5-10 mg total) by mouth 3 (three) times daily as needed., Disp: 30 tablet, Rfl: 0   cyclobenzaprine  (FLEXERIL ) 10 MG tablet, Take 1 tablet (10 mg total) by mouth 3 (three) times  daily as needed for muscle spasms., Disp: 30 tablet, Rfl: 0   guaiFENesin  (MUCINEX ) 600 MG 12 hr tablet, Take 2 tablets (1,200 mg total) by mouth 2 (two) times daily as needed for cough or to loosen phlegm., Disp: 120 tablet, Rfl: 0   ibuprofen  (ADVIL ) 600 MG tablet, Take 1 tablet (600 mg total) by mouth every 8 (eight) hours as needed., Disp: 30 tablet, Rfl: 0   ipratropium (ATROVENT ) 0.03 % nasal spray, Place 2 sprays into both nostrils every 12 (twelve) hours., Disp: 30 mL, Rfl: 0   lidocaine  (LIDOCAINE  PAIN RELIEF) 4 %, Place 3 patches onto the skin daily., Disp: 90 patch, Rfl: 0   lidocaine  (LIDODERM ) 5 %, Place 1 patch onto the skin daily. Remove & Discard patch within 12 hours or as directed by MD, Disp: 30 patch, Rfl: 0   lidocaine  4 %, Place 3 patches onto the skin daily. 12 hours on and 12 hours off, Disp: 90 patch, Rfl: 0   metroNIDAZOLE  (METROCREAM ) 0.75 % cream, Apply topically 2 (two) times daily., Disp: 45 g, Rfl: 0   naproxen  (NAPROSYN ) 500 MG tablet, Take 1 tablet (500 mg total) by mouth 2 (two) times daily with a meal., Disp: 30 tablet, Rfl: 0   naproxen  (NAPROSYN ) 500 MG tablet, Take 1 tablet (500 mg total) by mouth 2 (two) times daily with a meal., Disp: 30 tablet, Rfl: 0   nicotine  polacrilex (NICORETTE ) 2 MG gum, Weeks 1 to 6: Chew 1 piece of gum every  1 to 2 hours (maximum: 24 pieces/day); to increase chances of quitting, chew at least 9 pieces/day during the first 6 weeks., Disp: 100 each, Rfl: 0   olopatadine  (PATANOL) 0.1 % ophthalmic solution, Place 1 drop into both eyes 2 (two) times daily., Disp: 5 mL, Rfl: 0   pantoprazole  (PROTONIX ) 40 MG tablet, TAKE 1 TABLET(40 MG) BY MOUTH DAILY, Disp: 90 tablet, Rfl: 1   promethazine -dextromethorphan (PROMETHAZINE -DM) 6.25-15 MG/5ML syrup, Take 5 mLs by mouth 3 (three) times daily as needed for cough., Disp: 200 mL, Rfl: 0   triamcinolone  ointment (KENALOG ) 0.5 %, Apply 1 Application topically 2 (two) times daily., Disp: 30 g, Rfl:  0   valsartan -hydrochlorothiazide  (DIOVAN -HCT) 160-25 MG tablet, Take 1 tablet by mouth daily., Disp: 90 tablet, Rfl: 3   Medications ordered in this encounter:  No orders of the defined types were placed in this encounter.    *If you need refills on other medications prior to your next appointment, please contact your pharmacy*  Follow-Up: Call back or seek an in-person evaluation if the symptoms worsen or if the condition fails to improve as anticipated.  [facility] [phone]  Other Instructions I recommend continuing to use the ibuprofen  per package instructions for pain.  You can also try heat therapy or cold therapy (so a warm compress or an ice pack) to see if that helps relieve your symptoms.  I also wonder if you are not having a little bit of a reaction to what ever substance the object is made of.  Try taking Benadryl  (generic diphenhydramine  is okay to take) and apply topical hydrocortisone cream to the area to see if that helps calm the burning feeling   If you have been instructed to have an in-person evaluation today at a local Urgent Care facility, please use the link below. It will take you to a list of all of our available [place] [facility], including address, phone number and hours of operation. Please do not delay care.  [facility]  If you or a family member do not have a primary care provider, use the link below to schedule a visit and establish care. When you choose a [facility] primary care physician or advanced practice provider, you gain a long-term partner in health. Find a Primary Care Provider  Learn more about [facility] in-office and virtual care options: [place] - Get Care Now

## 2023-09-04 ENCOUNTER — Encounter: Admitting: Obstetrics and Gynecology

## 2023-09-12 ENCOUNTER — Telehealth: Admitting: Physician Assistant

## 2023-09-12 DIAGNOSIS — L03115 Cellulitis of right lower limb: Secondary | ICD-10-CM | POA: Diagnosis not present

## 2023-09-12 DIAGNOSIS — G8929 Other chronic pain: Secondary | ICD-10-CM

## 2023-09-12 DIAGNOSIS — M545 Low back pain, unspecified: Secondary | ICD-10-CM

## 2023-09-12 MED ORDER — CYCLOBENZAPRINE HCL 10 MG PO TABS: 10.0000 mg | ORAL_TABLET | Freq: Three times a day (TID) | ORAL | 0 refills | Status: DC | PRN

## 2023-09-12 MED ORDER — CEPHALEXIN 500 MG PO CAPS: 500.0000 mg | ORAL_CAPSULE | Freq: Two times a day (BID) | ORAL | 0 refills | Status: AC

## 2023-09-12 MED ORDER — NAPROXEN 500 MG PO TABS: 500.0000 mg | ORAL_TABLET | Freq: Two times a day (BID) | ORAL | 0 refills | Status: DC

## 2023-09-12 NOTE — Patient Instructions (Signed)
 Ann Moran, thank you for joining Hyla Maillard, PA-C for today's virtual visit.  While this provider is not your primary care provider (PCP), if your PCP is located in our provider database this encounter information will be shared with them immediately following your visit.   A [place] MyChart account gives you access to today's visit and all your visits, tests, and labs performed at [facility] " click here if you don't have a [place] MyChart account or go to [email or web address]  Consent: (Patient) Ann Moran provided verbal consent for this virtual visit at the beginning of the encounter.  Current Medications:  Current Outpatient Medications:    cephALEXin  (KEFLEX ) 500 MG capsule, Take 1 capsule (500 mg total) by mouth 2 (two) times daily for 7 days., Disp: 14 capsule, Rfl: 0   cyclobenzaprine  (FLEXERIL ) 10 MG tablet, Take 1 tablet (10 mg total) by mouth 3 (three) times daily as needed., Disp: 15 tablet, Rfl: 0   naproxen  (NAPROSYN ) 500 MG tablet, Take 1 tablet (500 mg total) by mouth 2 (two) times daily with a meal., Disp: 20 tablet, Rfl: 0   albuterol  (VENTOLIN  HFA) 108 (90 Base) MCG/ACT inhaler, Inhale 1-2 puffs into the lungs every 6 (six) hours as needed., Disp: 8 g, Rfl: 0   amLODipine  (NORVASC ) 10 MG tablet, Take 1 tablet (10 mg total) by mouth daily. TAKE 1 TABLET(10 MG) BY MOUTH DAILY. Please make PCP appointment for more refills., Disp: 90 tablet, Rfl: 1   benzonatate  (TESSALON ) 100 MG capsule, Take 1-2 capsules (100-200 mg total) by mouth 3 (three) times daily as needed., Disp: 30 capsule, Rfl: 0   Blood Pressure Monitoring (BLOOD PRESSURE CUFF) MISC, Use to check blood pressure once daily., Disp: 1 each, Rfl: 0   brompheniramine-pseudoephedrine -DM 30-2-10 MG/5ML syrup, Take 5 mLs by mouth 4 (four) times daily as needed., Disp: 240 mL, Rfl: 0   cetirizine  (ZYRTEC  ALLERGY) 10 MG tablet, Take 1 tablet (10 mg total) by mouth at bedtime  for 15 days., Disp: 15 tablet, Rfl: 0   chlorhexidine  (PERIDEX ) 0.12 % solution, Use as directed 15 mLs in the mouth or throat 2 (two) times daily., Disp: 120 mL, Rfl: 0   guaiFENesin  (MUCINEX ) 600 MG 12 hr tablet, Take 2 tablets (1,200 mg total) by mouth 2 (two) times daily as needed for cough or to loosen phlegm., Disp: 120 tablet, Rfl: 0   ibuprofen  (ADVIL ) 600 MG tablet, Take 1 tablet (600 mg total) by mouth every 8 (eight) hours as needed., Disp: 30 tablet, Rfl: 0   ipratropium (ATROVENT ) 0.03 % nasal spray, Place 2 sprays into both nostrils every 12 (twelve) hours., Disp: 30 mL, Rfl: 0   lidocaine  (LIDODERM ) 5 %, Place 1 patch onto the skin daily. Remove & Discard patch within 12 hours or as directed by MD, Disp: 30 patch, Rfl: 0   metroNIDAZOLE  (METROCREAM ) 0.75 % cream, Apply topically 2 (two) times daily., Disp: 45 g, Rfl: 0   nicotine  polacrilex (NICORETTE ) 2 MG gum, Weeks 1 to 6: Chew 1 piece of gum every 1 to 2 hours (maximum: 24 pieces/day); to increase chances of quitting, chew at least 9 pieces/day during the first 6 weeks., Disp: 100 each, Rfl: 0   olopatadine  (PATANOL) 0.1 % ophthalmic solution, Place 1 drop into both eyes 2 (two) times daily., Disp: 5 mL, Rfl: 0   pantoprazole  (PROTONIX ) 40 MG tablet, TAKE 1 TABLET(40 MG) BY MOUTH DAILY, Disp: 90 tablet, Rfl: 1   promethazine -dextromethorphan (PROMETHAZINE -DM)  6.25-15 MG/5ML syrup, Take 5 mLs by mouth 3 (three) times daily as needed for cough., Disp: 200 mL, Rfl: 0   triamcinolone  ointment (KENALOG ) 0.5 %, Apply 1 Application topically 2 (two) times daily., Disp: 30 g, Rfl: 0   valsartan -hydrochlorothiazide  (DIOVAN -HCT) 160-25 MG tablet, Take 1 tablet by mouth daily., Disp: 90 tablet, Rfl: 3   Medications ordered in this encounter:  Meds ordered this encounter  Medications   cephALEXin  (KEFLEX ) 500 MG capsule    Sig: Take 1 capsule (500 mg total) by mouth 2 (two) times daily for 7 days.    Dispense:  14 capsule    Refill:  0     Supervising Provider:   LAMPTEY, PHILIP O [[id number]]   cyclobenzaprine  (FLEXERIL ) 10 MG tablet    Sig: Take 1 tablet (10 mg total) by mouth 3 (three) times daily as needed.    Dispense:  15 tablet    Refill:  0    Supervising Provider:   LAMPTEY, PHILIP O [[id number]]   naproxen  (NAPROSYN ) 500 MG tablet    Sig: Take 1 tablet (500 mg total) by mouth 2 (two) times daily with a meal.    Dispense:  20 tablet    Refill:  0    Supervising Provider:   Corine Dice [[id number]]     *If you need refills on other medications prior to your next appointment, please contact your pharmacy*  Follow-Up: Call back or seek an in-person evaluation if the symptoms worsen or if the condition fails to improve as anticipated.  [facility] [phone]  Other Instructions Please keep skin clean and dry. Get some Telfa dressings OTC to help keep the area covered. Take the antibiotic as directed.  I have refilled your Napropsyn and Flexeril  for back pain.    Additional Resources: [employer] are also available to help you quit & provide the support you'll need. Many programs are available in both [place] and Spanish and have a long history of successfully helping people get off and stay off tobacco.           Quit Smoking Apps;  quitSTART, QuitGuide          Online education and resources; Smokefree          Free [employer]; QuitNow,  Call 1-800-QUIT-NOW ((816)671-8170) or Text- Ready to (667)187-9381                    *Quitline [place] has teamed up with Medicaid to offer a free 14 week program          If you have been instructed to have an in-person evaluation today at a local Urgent Care facility, please use the link below. It will take you to a list of all of our available [place] [facility], including address, phone number and hours of operation. Please do not delay care.  [place] [facility]  If you or a family member do not have a primary care provider, use the  link below to schedule a visit and establish care. When you choose a [facility] primary care physician or advanced practice provider, you gain a long-term partner in health. Find a Primary Care Provider  Learn more about [facility]'s in-office and virtual care options: [place] - Get Care Now

## 2023-09-12 NOTE — Progress Notes (Signed)
 Virtual Visit Consent   Casa[place] Takacs, you are scheduled for a virtual visit with a [facility] provider today. Just as with appointments in the office, your consent must be obtained to participate. Your consent will be active for this visit and any virtual visit you may have with one of our providers in the next 365 days. If you have a MyChart account, a copy of this consent can be sent to you electronically.  As this is a virtual visit, video technology does not allow for your provider to perform a traditional examination. This may limit your provider's ability to fully assess your condition. If your provider identifies any concerns that need to be evaluated in person or the need to arrange testing (such as labs, EKG, etc.), we will make arrangements to do so. Although advances in technology are sophisticated, we cannot ensure that it will always work on either your end or our end. If the connection with a video visit is poor, the visit may have to be switched to a telephone visit. With either a video or telephone visit, we are not always able to ensure that we have a secure connection.  By engaging in this virtual visit, you consent to the provision of healthcare and authorize for your insurance to be billed (if applicable) for the services provided during this visit. Depending on your insurance coverage, you may receive a charge related to this service.  I need to obtain your verbal consent now. Are you willing to proceed with your visit today? Brooks Stotz has provided verbal consent on 09/12/2023 for a virtual visit (video or telephone). Ann Moran, [place]  Date: 09/12/2023 9:44 AM   Virtual Visit via Video Note   I, Ann Moran, connected with  Tenea Sens  ([id number], 11/22/1985) on 09/12/23 at  9:15 AM EDT by a video-enabled telemedicine application and verified that I am speaking with the correct person using two identifiers.  Location: Patient: Virtual  Visit Location Patient: Home Provider: Virtual Visit Location Provider: Home Office   I discussed the limitations of evaluation and management by telemedicine and the availability of in person appointments. The patient expressed understanding and agreed to proceed.    History of Present Illness: Krista Godsil is a 38 y.o. who identifies as a female who was assigned female at birth, and is being seen today for multiple complaints.  First patient is following up regarding last video visit with our team for a sore of her R hip. Notes the initially swelling resolved with "popping". Was left with mild area that was healing, but over past few days has become red, itchy and painful. Denies fever, chills. Denies drainage from the area.   Patient also noting a flare of her chronic low back pain, bilateral and non-radiating. Is asking for refill of her Naprosyn  and Flexeril  until she can follow-up with PCP.  HPI: HPI  Problems:  Patient Active Problem List   Diagnosis Date Noted   Sinusitis, acute 01/31/2022   Migraine 01/31/2022   Anxiety and depression 01/31/2022   OSA (obstructive sleep apnea) 01/25/2022   Smoking 02/23/2021   Patellar tendinitis of both knees 01/27/2021   Pain in joint involving right ankle and foot 01/27/2021   Thoracic back pain 12/23/2020   Right elbow pain 12/23/2020   Chronic pain of right knee 12/23/2020   Ankle fracture, left 08/09/2020   Dysphagia 05/20/2019   Encounter for other preprocedural examination 05/20/2019   PCOS (polycystic ovarian syndrome)  02/24/2019   Neck pain 02/24/2019   Morbid obesity with body mass index (BMI) of 45.0 to 49.9 in adult [facility]) 02/24/2019   Essential hypertension 02/24/2019   Gastroesophageal reflux disease 02/24/2019   Irregular menstrual cycle 01/14/2019   Abnormal uterine bleeding 01/14/2019   Chronic cough 01/14/2019    Allergies:  Allergies  Allergen Reactions   Prednisone  Swelling    Had neck and shoulder  swelling following taking   Medications:  Current Outpatient Medications:    cephALEXin  (KEFLEX ) 500 MG capsule, Take 1 capsule (500 mg total) by mouth 2 (two) times daily for 7 days., Disp: 14 capsule, Rfl: 0   cyclobenzaprine  (FLEXERIL ) 10 MG tablet, Take 1 tablet (10 mg total) by mouth 3 (three) times daily as needed., Disp: 15 tablet, Rfl: 0   naproxen  (NAPROSYN ) 500 MG tablet, Take 1 tablet (500 mg total) by mouth 2 (two) times daily with a meal., Disp: 20 tablet, Rfl: 0   albuterol  (VENTOLIN  HFA) 108 (90 Base) MCG/ACT inhaler, Inhale 1-2 puffs into the lungs every 6 (six) hours as needed., Disp: 8 g, Rfl: 0   amLODipine  (NORVASC ) 10 MG tablet, Take 1 tablet (10 mg total) by mouth daily. TAKE 1 TABLET(10 MG) BY MOUTH DAILY. Please make PCP appointment for more refills., Disp: 90 tablet, Rfl: 1   benzonatate  (TESSALON ) 100 MG capsule, Take 1-2 capsules (100-200 mg total) by mouth 3 (three) times daily as needed., Disp: 30 capsule, Rfl: 0   Blood Pressure Monitoring (BLOOD PRESSURE CUFF) MISC, Use to check blood pressure once daily., Disp: 1 each, Rfl: 0   brompheniramine-pseudoephedrine -DM 30-2-10 MG/5ML syrup, Take 5 mLs by mouth 4 (four) times daily as needed., Disp: 240 mL, Rfl: 0   cetirizine  (ZYRTEC  ALLERGY) 10 MG tablet, Take 1 tablet (10 mg total) by mouth at bedtime for 15 days., Disp: 15 tablet, Rfl: 0   chlorhexidine  (PERIDEX ) 0.12 % solution, Use as directed 15 mLs in the mouth or throat 2 (two) times daily., Disp: 120 mL, Rfl: 0   guaiFENesin  (MUCINEX ) 600 MG 12 hr tablet, Take 2 tablets (1,200 mg total) by mouth 2 (two) times daily as needed for cough or to loosen phlegm., Disp: 120 tablet, Rfl: 0   ibuprofen  (ADVIL ) 600 MG tablet, Take 1 tablet (600 mg total) by mouth every 8 (eight) hours as needed., Disp: 30 tablet, Rfl: 0   ipratropium (ATROVENT ) 0.03 % nasal spray, Place 2 sprays into both nostrils every 12 (twelve) hours., Disp: 30 mL, Rfl: 0   lidocaine  (LIDODERM ) 5 %, Place  1 patch onto the skin daily. Remove & Discard patch within 12 hours or as directed by MD, Disp: 30 patch, Rfl: 0   metroNIDAZOLE  (METROCREAM ) 0.75 % cream, Apply topically 2 (two) times daily., Disp: 45 g, Rfl: 0   nicotine  polacrilex (NICORETTE ) 2 MG gum, Weeks 1 to 6: Chew 1 piece of gum every 1 to 2 hours (maximum: 24 pieces/day); to increase chances of quitting, chew at least 9 pieces/day during the first 6 weeks., Disp: 100 each, Rfl: 0   olopatadine  (PATANOL) 0.1 % ophthalmic solution, Place 1 drop into both eyes 2 (two) times daily., Disp: 5 mL, Rfl: 0   pantoprazole  (PROTONIX ) 40 MG tablet, TAKE 1 TABLET(40 MG) BY MOUTH DAILY, Disp: 90 tablet, Rfl: 1   promethazine -dextromethorphan (PROMETHAZINE -DM) 6.25-15 MG/5ML syrup, Take 5 mLs by mouth 3 (three) times daily as needed for cough., Disp: 200 mL, Rfl: 0   triamcinolone  ointment (KENALOG ) 0.5 %, Apply 1 Application  topically 2 (two) times daily., Disp: 30 g, Rfl: 0   valsartan -hydrochlorothiazide  (DIOVAN -HCT) 160-25 MG tablet, Take 1 tablet by mouth daily., Disp: 90 tablet, Rfl: 3  Observations/Objective: Patient is well-developed, well-nourished in no acute distress.  Resting comfortably at home.  Head is normocephalic, atraumatic.  No labored breathing.  Speech is clear and coherent with logical content.  Patient is alert and oriented at baseline.   Assessment and Plan: 1. Cellulitis of right hip (Primary) - cephALEXin  (KEFLEX ) 500 MG capsule; Take 1 capsule (500 mg total) by mouth 2 (two) times daily for 7 days.  Dispense: 14 capsule; Refill: 0  Unable to fully visualize the area as she is passenger in car at time of visit. Seems consistent with a secondary cellulitis. Supportive measures and OTC medications reviewed. Keflex  per orders. Follow-up in person for any non-resolving, new or worsening symptoms.   2. Chronic bilateral low back pain without sciatica - cyclobenzaprine  (FLEXERIL ) 10 MG tablet; Take 1 tablet (10 mg total) by  mouth 3 (three) times daily as needed.  Dispense: 15 tablet; Refill: 0 - naproxen  (NAPROSYN ) 500 MG tablet; Take 1 tablet (500 mg total) by mouth 2 (two) times daily with a meal.  Dispense: 20 tablet; Refill: 0  Medications refilled. Follow-up with PCP.  Follow Up Instructions: I discussed the assessment and treatment plan with the patient. The patient was provided an opportunity to ask questions and all were answered. The patient agreed with the plan and demonstrated an understanding of the instructions.  A copy of instructions were sent to the patient via MyChart unless otherwise noted below.   The patient was advised to call back or seek an in-person evaluation if the symptoms worsen or if the condition fails to improve as anticipated.    Ann Maillard, PA-C

## 2023-09-19 ENCOUNTER — Ambulatory Visit

## 2023-10-14 ENCOUNTER — Ambulatory Visit

## 2023-11-29 ENCOUNTER — Ambulatory Visit

## 2023-11-29 ENCOUNTER — Emergency Department (HOSPITAL_BASED_OUTPATIENT_CLINIC_OR_DEPARTMENT_OTHER)

## 2023-11-29 ENCOUNTER — Emergency Department (HOSPITAL_BASED_OUTPATIENT_CLINIC_OR_DEPARTMENT_OTHER)
Admission: EM | Admit: 2023-11-29 | Discharge: 2023-11-29 | Disposition: A | Discharge status: Home / Self Care | Attending: Emergency Medicine | Admitting: Emergency Medicine

## 2023-11-29 ENCOUNTER — Encounter (HOSPITAL_BASED_OUTPATIENT_CLINIC_OR_DEPARTMENT_OTHER): Payer: Self-pay

## 2023-11-29 ENCOUNTER — Other Ambulatory Visit: Payer: Self-pay

## 2023-11-29 DIAGNOSIS — I10 Essential (primary) hypertension: Secondary | ICD-10-CM | POA: Diagnosis not present

## 2023-11-29 DIAGNOSIS — R1084 Generalized abdominal pain: Secondary | ICD-10-CM | POA: Diagnosis present

## 2023-11-29 DIAGNOSIS — Z79899 Other long term (current) drug therapy: Secondary | ICD-10-CM | POA: Diagnosis not present

## 2023-11-29 DIAGNOSIS — R197 Diarrhea, unspecified: Secondary | ICD-10-CM | POA: Diagnosis not present

## 2023-11-29 DIAGNOSIS — R110 Nausea: Secondary | ICD-10-CM | POA: Diagnosis not present

## 2023-11-29 DIAGNOSIS — F17210 Nicotine dependence, cigarettes, uncomplicated: Secondary | ICD-10-CM | POA: Insufficient documentation

## 2023-11-29 HISTORY — DX: Personal history of other diseases of the female genital tract: Z87.42

## 2023-11-29 LAB — URINALYSIS, MICROSCOPIC (REFLEX): RBC / HPF: NONE SEEN RBC/hpf (ref 0–5)

## 2023-11-29 LAB — COMPREHENSIVE METABOLIC PANEL WITH GFR
ALT: 39 U/L (ref 0–44)
AST: 21 U/L (ref 15–41)
Albumin: 4.1 g/dL (ref 3.5–5.0)
Alkaline Phosphatase: 69 U/L (ref 38–126)
Anion gap: 11 (ref 5–15)
BUN: 11 mg/dL (ref 6–20)
CO2: 27 mmol/L (ref 22–32)
Calcium: 9.1 mg/dL (ref 8.9–10.3)
Chloride: 102 mmol/L (ref 98–111)
Creatinine, Ser: 0.85 mg/dL (ref 0.44–1.00)
GFR, Estimated: >60 mL/min (ref >60)
Glucose, Bld: 185 mg/dL — ABNORMAL HIGH (ref 70–99)
Potassium: 3.5 mmol/L (ref 3.5–5.1)
Sodium: 140 mmol/L (ref 135–145)
Total Bilirubin: 0.2 mg/dL (ref 0.0–1.2)
Total Protein: 6.9 g/dL (ref 6.5–8.1)

## 2023-11-29 LAB — CBC
HCT: 38.1 % (ref 36.0–46.0)
Hemoglobin: 12.3 g/dL (ref 12.0–15.0)
MCH: 29.1 pg (ref 26.0–34.0)
MCHC: 32.3 g/dL (ref 30.0–36.0)
MCV: 90.3 fL (ref 80.0–100.0)
Platelets: 306 K/uL (ref 150–400)
RBC: 4.22 MIL/uL (ref 3.87–5.11)
RDW: 14 % (ref 11.5–15.5)
WBC: 8.5 K/uL (ref 4.0–10.5)
nRBC: 0 % (ref 0.0–0.2)

## 2023-11-29 LAB — URINALYSIS, ROUTINE W REFLEX MICROSCOPIC
Glucose, UA: NEGATIVE mg/dL
Ketones, ur: 15 mg/dL — AB
Leukocytes,Ua: NEGATIVE
Nitrite: NEGATIVE
Protein, ur: 30 mg/dL — AB
Specific Gravity, Urine: 1.025 (ref 1.005–1.030)
pH: 6 (ref 5.0–8.0)

## 2023-11-29 LAB — HCG, SERUM, QUALITATIVE: Preg, Serum: NEGATIVE

## 2023-11-29 LAB — LIPASE, BLOOD: Lipase: 11 U/L (ref 11–51)

## 2023-11-29 MED ORDER — LIDOCAINE VISCOUS HCL 2 % MT SOLN
15.0000 mL | Freq: Once | OROMUCOSAL | Status: AC
  Administered 2023-11-29: 15 mL via ORAL
  Filled 2023-11-29: qty 15

## 2023-11-29 MED ORDER — ALUM & MAG HYDROXIDE-SIMETH 200-200-20 MG/5ML PO SUSP
30.0000 mL | Freq: Once | ORAL | Status: AC
  Administered 2023-11-29: 30 mL via ORAL
  Filled 2023-11-29: qty 30

## 2023-11-29 MED ORDER — SODIUM CHLORIDE 0.9 % IV BOLUS
1000.0000 mL | Freq: Once | INTRAVENOUS | Status: AC
  Administered 2023-11-29: 1000 mL via INTRAVENOUS

## 2023-11-29 MED ORDER — LOPERAMIDE HCL 2 MG PO CAPS: 2.0000 mg | ORAL_CAPSULE | Freq: Four times a day (QID) | ORAL | 0 refills | Status: AC | PRN

## 2023-11-29 MED ORDER — IOHEXOL 300 MG/ML  SOLN
125.0000 mL | Freq: Once | INTRAMUSCULAR | Status: AC | PRN
  Administered 2023-11-29: 125 mL via INTRAVENOUS

## 2023-11-29 MED ORDER — ONDANSETRON 4 MG PO TBDP: 4.0000 mg | ORAL_TABLET | Freq: Three times a day (TID) | ORAL | 0 refills | Status: AC | PRN

## 2023-11-29 NOTE — ED Triage Notes (Signed)
 Pt reports that she has been having some abdominal pain since early this morning. States that she has PCOS and that she has had diarrhea x 3. States that she also did have some vaginal bleeding this morning but it is now gone.

## 2023-11-29 NOTE — ED Notes (Signed)
 Patient transported to CT

## 2023-11-29 NOTE — ED Provider Notes (Signed)
 [place] [facility] AT [facility] Provider Note   CSN: [id number] Arrival date & time: 11/29/23  1701     Patient presents with: Abdominal Pain   Ann Moran is a 38 y.o. female.   38 year old female presents with complaint of generalized abdominal pain, more so upper abdomen, associated with nausea and diarrhea. Symptoms started this morning. Drinks alcohol on occasion, not recently, smokes cigarettes. Denies changes in bladder habits, fevers, chills, abnormal dc. Prior abdominal surgeries include ovarian cyst and cholecystectomy.        Prior to Admission medications   Medication Sig Start Date End Date Taking? Authorizing Provider  albuterol  (VENTOLIN  HFA) 108 (90 Base) MCG/ACT inhaler Inhale 1-2 puffs into the lungs every 6 (six) hours as needed. 07/12/23   Vivienne Delon HERO, PA-C  amLODipine  (NORVASC ) 10 MG tablet Take 1 tablet (10 mg total) by mouth daily. TAKE 1 TABLET(10 MG) BY MOUTH DAILY. Please make PCP appointment for more refills. 03/20/23 06/18/23  Billy Philippe SAUNDERS, NP  benzonatate  (TESSALON ) 100 MG capsule Take 1-2 capsules (100-200 mg total) by mouth 3 (three) times daily as needed. 07/12/23   Vivienne Delon HERO, PA-C  Blood Pressure Monitoring (BLOOD PRESSURE CUFF) MISC Use to check blood pressure once daily. 08/29/21   Newlin, Enobong, MD  cetirizine  (ZYRTEC  ALLERGY) 10 MG tablet Take 1 tablet (10 mg total) by mouth at bedtime for 15 days. 08/30/22 03/20/23  Kennyth Domino, FNP  olopatadine  (PATANOL) 0.1 % ophthalmic solution Place 1 drop into both eyes 2 (two) times daily. 08/22/23   Vivienne Delon HERO, PA-C  valsartan -hydrochlorothiazide  (DIOVAN -HCT) 160-25 MG tablet Take 1 tablet by mouth daily. 01/31/22   Brien Belvie BRAVO, MD  medroxyPROGESTERone  (PROVERA ) 5 MG tablet Take 2 tablets (10 mg total) by mouth daily. Patient not taking: Reported on 07/20/2018 06/30/18 12/06/18  Geroldine Berg, MD    Allergies: Prednisone     Review of  Systems Negative except as per HPI Updated Vital Signs BP (!) 133/95 (BP Location: Left Wrist)   Pulse (!) 119   Temp 98.1 F (36.7 C) (Oral)   Resp 18   Ht 5' 9 (1.753 m)   Wt (!) 163.3 kg   LMP 11/04/2023 (Approximate)   SpO2 95%   BMI 53.16 kg/m   Physical Exam Vitals and nursing note reviewed.  Constitutional:      General: She is not in acute distress.    Appearance: She is well-developed. She is not diaphoretic.  HENT:     Head: Normocephalic and atraumatic.  Cardiovascular:     Rate and Rhythm: Regular rhythm. Tachycardia present.     Heart sounds: Normal heart sounds.  Pulmonary:     Effort: Pulmonary effort is normal.     Breath sounds: Normal breath sounds.  Abdominal:     Palpations: Abdomen is soft.     Tenderness: There is generalized abdominal tenderness. There is no right CVA tenderness, left CVA tenderness, guarding or rebound. Negative signs include Jamus Loving's sign.  Skin:    General: Skin is warm and dry.  Neurological:     Mental Status: She is alert and oriented to person, place, and time.  Psychiatric:        Behavior: Behavior normal.     (all labs ordered are listed, but only abnormal results are displayed) Labs Reviewed  COMPREHENSIVE METABOLIC PANEL WITH GFR - Abnormal; Notable for the following components:      Result Value   Glucose, Bld 185 (*)  All other components within normal limits  LIPASE, BLOOD  CBC  HCG, SERUM, QUALITATIVE  URINALYSIS, ROUTINE W REFLEX MICROSCOPIC    EKG: None  Radiology: No results found.   Procedures   Medications Ordered in the ED  sodium chloride  0.9 % bolus 1,000 mL (1,000 mLs Intravenous New Bag/Given 11/29/23 1754)                                    Medical Decision Making Amount and/or Complexity of Data Reviewed Labs: ordered. Radiology: ordered.   This patient presents to the ED for concern of abdominal pain, this involves an extensive number of treatment options, and is a complaint  that carries with it a high risk of complications and morbidity.  The differential diagnosis includes but not limited to gastritis, pancreatitis, metabolic/electrolyte, pregnancy   Co morbidities / Chronic conditions that complicate the patient evaluation  PCOS, obesity, hypertension, GERD   Additional history obtained:  Additional history obtained from EMR External records from outside source obtained and reviewed including prior labs on file   Lab Tests:  I Ordered, and personally interpreted labs.  The pertinent results include: CBC within normal notes.  CMP with elevated glucose at 185.  Lipase normal.  hCG negative.  Urinalysis is pending at time of signout   Imaging Studies ordered:  I ordered imaging studies including CT abdomen and pelvis Pending at time of signout   Problem List / ED Course / Critical interventions / Medication management  38 year old female presents with complaint of generalized abdominal pain, more so to the upper abdomen with nausea and diarrhea today.  Found to have mild generalized discomfort.  Patient is tachycardic at time of arrival with rate of 119, rate 105 at time of exam.  She is provided with IV fluids.  Labs overall are reassuring.  Care is signed out at change of shift pending CT abdomen pelvis results. I ordered medication including IV fluids I have reviewed the patients home medicines and have made adjustments as needed    Social Determinants of Health:  Has PCP   Test / Admission - Considered:  Disposition pending at time of signout      Final diagnoses:  Generalized abdominal pain    ED Discharge Orders     None          Beverley Leita LABOR, PA-C 11/29/23 1851    Emil Share, DO 11/29/23 [place]

## 2023-11-29 NOTE — ED Notes (Signed)
 Asked patient for urine sample. Pt stated not having to urinate at the time will try later.

## 2023-11-29 NOTE — ED Provider Notes (Signed)
 Accepted handoff at shift change from Leita Chancy PA-C. Please see prior provider note for more detail.   Briefly: Patient is 38 y.o. presents today for generalized abdominal pain, more so upper abdomen with nausea and diarrhea.  Patient states that symptom started this morning.  Patient denies fever, chills, urinary symptoms, vaginal discharge, or any other complaints at this time.  Patient has a history of ovarian cyst and cholecystectomy.  DDX: concern for colitis, pancreatitis, appendicitis  Plan: IV fluids and CT abdomen pelvis, GI cocktail while in ED and likely discharge with symptomatic management.  Physical Exam  BP (!) 133/95 (BP Location: Left Wrist)   Pulse (!) 119   Temp 98.1 F (36.7 C) (Oral)   Resp 18   Ht 5' 9 (1.753 m)   Wt (!) 163.3 kg   LMP 11/04/2023 (Approximate)   SpO2 95%   BMI 53.16 kg/m   Physical Exam  Procedures  Procedures  ED Course / MDM    Medical Decision Making Amount and/or Complexity of Data Reviewed Labs: ordered. Radiology: ordered.   UA with small bilirubin, trace hemoglobin, 15 ketones, 30 protein, rare bacteria, 6-10 epithelials  CT abdomen pelvis with contrast that showed no acute process in the abdomen or pelvis, hepatic steatosis and hepatomegaly.  Patient given GI cocktail.  Considered for admission or further workup however patient's vital signs, physical exam, labs, and imaging are reassuring.  Patient given symptomatic management with Zofran  and Imodium  outpatient.  Patient given return precautions.  I feel patient safe for discharge at this time.       Francis Ileana SAILOR, PA-C 11/29/23 2011    Emil Share, DO 11/29/23 2113

## 2023-11-29 NOTE — Discharge Instructions (Signed)
 Today you were seen for abdominal pain with nausea and diarrhea.  Please pick up your Imodium  and Zofran  and take as needed for diarrhea and nausea.  Please return to the ED if you have worsening symptoms, uncontrollable vomiting, or fever that does not go down with Tylenol  or Motrin .  Thank you for letting us  treat you today. After reviewing your labs and imaging, I feel you are safe to go home. Please follow up with your PCP in the next several days and provide them with your records from this visit. Return to the Emergency Room if pain becomes severe or symptoms worsen.

## 2023-12-19 ENCOUNTER — Other Ambulatory Visit: Payer: Self-pay | Admitting: Family Medicine

## 2023-12-19 DIAGNOSIS — I10 Essential (primary) hypertension: Secondary | ICD-10-CM
